# Patient Record
Sex: Male | Born: 1960 | Race: White | Hispanic: No | Marital: Married | Smoking: Current some day smoker
Health system: Southern US, Community
[De-identification: ages and names within clinical notes are randomized; demographics above are authoritative.]

## PROBLEM LIST (undated history)

## (undated) ENCOUNTER — Emergency Department (HOSPITAL_COMMUNITY): Payer: Medicare HMO | Source: Home / Self Care

## (undated) DIAGNOSIS — G2581 Restless legs syndrome: Secondary | ICD-10-CM

## (undated) DIAGNOSIS — I2510 Atherosclerotic heart disease of native coronary artery without angina pectoris: Secondary | ICD-10-CM

## (undated) DIAGNOSIS — I81 Portal vein thrombosis: Secondary | ICD-10-CM

## (undated) DIAGNOSIS — C3490 Malignant neoplasm of unspecified part of unspecified bronchus or lung: Secondary | ICD-10-CM

## (undated) DIAGNOSIS — F329 Major depressive disorder, single episode, unspecified: Secondary | ICD-10-CM

## (undated) DIAGNOSIS — C787 Secondary malignant neoplasm of liver and intrahepatic bile duct: Secondary | ICD-10-CM

## (undated) DIAGNOSIS — F32A Depression, unspecified: Secondary | ICD-10-CM

## (undated) DIAGNOSIS — G8929 Other chronic pain: Secondary | ICD-10-CM

## (undated) DIAGNOSIS — J449 Chronic obstructive pulmonary disease, unspecified: Secondary | ICD-10-CM

## (undated) DIAGNOSIS — M549 Dorsalgia, unspecified: Secondary | ICD-10-CM

## (undated) DIAGNOSIS — K219 Gastro-esophageal reflux disease without esophagitis: Secondary | ICD-10-CM

## (undated) DIAGNOSIS — C7951 Secondary malignant neoplasm of bone: Secondary | ICD-10-CM

## (undated) DIAGNOSIS — E785 Hyperlipidemia, unspecified: Secondary | ICD-10-CM

## (undated) DIAGNOSIS — K3184 Gastroparesis: Secondary | ICD-10-CM

## (undated) DIAGNOSIS — E119 Type 2 diabetes mellitus without complications: Secondary | ICD-10-CM

## (undated) DIAGNOSIS — I219 Acute myocardial infarction, unspecified: Secondary | ICD-10-CM

## (undated) DIAGNOSIS — Z79899 Other long term (current) drug therapy: Secondary | ICD-10-CM

## (undated) HISTORY — PX: BACK SURGERY: SHX140

## (undated) HISTORY — PX: CARDIAC CATHETERIZATION: SHX172

---

## 2001-03-21 DIAGNOSIS — I219 Acute myocardial infarction, unspecified: Secondary | ICD-10-CM

## 2001-03-21 HISTORY — DX: Acute myocardial infarction, unspecified: I21.9

## 2018-07-11 ENCOUNTER — Ambulatory Visit (HOSPITAL_COMMUNITY)
Admission: RE | Admit: 2018-07-11 | Discharge: 2018-07-11 | Disposition: A | Discharge status: Home / Self Care | Payer: Medicare HMO | Source: Ambulatory Visit | Attending: Family | Admitting: Family

## 2018-07-11 ENCOUNTER — Other Ambulatory Visit (HOSPITAL_COMMUNITY): Payer: Self-pay | Admitting: Neurosurgery

## 2018-07-11 ENCOUNTER — Other Ambulatory Visit: Payer: Self-pay

## 2018-07-11 DIAGNOSIS — R600 Localized edema: Secondary | ICD-10-CM

## 2018-07-12 ENCOUNTER — Encounter (HOSPITAL_BASED_OUTPATIENT_CLINIC_OR_DEPARTMENT_OTHER): Payer: Self-pay | Admitting: *Deleted

## 2018-07-12 ENCOUNTER — Other Ambulatory Visit: Payer: Self-pay

## 2018-07-12 ENCOUNTER — Emergency Department (HOSPITAL_BASED_OUTPATIENT_CLINIC_OR_DEPARTMENT_OTHER)
Admission: EM | Admit: 2018-07-12 | Discharge: 2018-07-12 | Disposition: A | Discharge status: Home / Self Care | Payer: Medicare HMO | Attending: Emergency Medicine | Admitting: Emergency Medicine

## 2018-07-12 DIAGNOSIS — Z7982 Long term (current) use of aspirin: Secondary | ICD-10-CM | POA: Diagnosis not present

## 2018-07-12 DIAGNOSIS — R112 Nausea with vomiting, unspecified: Secondary | ICD-10-CM | POA: Insufficient documentation

## 2018-07-12 DIAGNOSIS — M545 Low back pain, unspecified: Secondary | ICD-10-CM

## 2018-07-12 DIAGNOSIS — Z7984 Long term (current) use of oral hypoglycemic drugs: Secondary | ICD-10-CM | POA: Insufficient documentation

## 2018-07-12 DIAGNOSIS — R197 Diarrhea, unspecified: Secondary | ICD-10-CM | POA: Diagnosis not present

## 2018-07-12 DIAGNOSIS — Z79899 Other long term (current) drug therapy: Secondary | ICD-10-CM | POA: Insufficient documentation

## 2018-07-12 DIAGNOSIS — F17210 Nicotine dependence, cigarettes, uncomplicated: Secondary | ICD-10-CM | POA: Insufficient documentation

## 2018-07-12 DIAGNOSIS — E119 Type 2 diabetes mellitus without complications: Secondary | ICD-10-CM | POA: Insufficient documentation

## 2018-07-12 DIAGNOSIS — J449 Chronic obstructive pulmonary disease, unspecified: Secondary | ICD-10-CM | POA: Diagnosis not present

## 2018-07-12 HISTORY — DX: Type 2 diabetes mellitus without complications: E11.9

## 2018-07-12 HISTORY — DX: Gastro-esophageal reflux disease without esophagitis: K21.9

## 2018-07-12 HISTORY — DX: Acute myocardial infarction, unspecified: I21.9

## 2018-07-12 HISTORY — DX: Gastroparesis: K31.84

## 2018-07-12 HISTORY — DX: Dorsalgia, unspecified: M54.9

## 2018-07-12 HISTORY — DX: Other chronic pain: G89.29

## 2018-07-12 HISTORY — DX: Atherosclerotic heart disease of native coronary artery without angina pectoris: I25.10

## 2018-07-12 HISTORY — DX: Other long term (current) drug therapy: Z79.899

## 2018-07-12 HISTORY — DX: Hyperlipidemia, unspecified: E78.5

## 2018-07-12 HISTORY — DX: Chronic obstructive pulmonary disease, unspecified: J44.9

## 2018-07-12 HISTORY — DX: Major depressive disorder, single episode, unspecified: F32.9

## 2018-07-12 HISTORY — DX: Depression, unspecified: F32.A

## 2018-07-12 HISTORY — DX: Restless legs syndrome: G25.81

## 2018-07-12 LAB — COMPREHENSIVE METABOLIC PANEL
ALT: 18 U/L (ref 0–44)
AST: 15 U/L (ref 15–41)
Albumin: 3.3 g/dL — ABNORMAL LOW (ref 3.5–5.0)
Alkaline Phosphatase: 42 U/L (ref 38–126)
Anion gap: 10 (ref 5–15)
BUN: 9 mg/dL (ref 6–20)
CO2: 20 mmol/L — ABNORMAL LOW (ref 22–32)
Calcium: 8.8 mg/dL — ABNORMAL LOW (ref 8.9–10.3)
Chloride: 106 mmol/L (ref 98–111)
Creatinine, Ser: 0.63 mg/dL (ref 0.61–1.24)
GFR calc Af Amer: >60 mL/min (ref >60)
GFR calc non Af Amer: >60 mL/min (ref >60)
Glucose, Bld: 132 mg/dL — ABNORMAL HIGH (ref 70–99)
Potassium: 3.5 mmol/L (ref 3.5–5.1)
Sodium: 136 mmol/L (ref 135–145)
Total Bilirubin: 0.5 mg/dL (ref 0.3–1.2)
Total Protein: 6.5 g/dL (ref 6.5–8.1)

## 2018-07-12 LAB — CBC WITH DIFFERENTIAL/PLATELET
Abs Immature Granulocytes: 0.02 10*3/uL (ref 0.00–0.07)
Basophils Absolute: 0 10*3/uL (ref 0.0–0.1)
Basophils Relative: 1 %
Eosinophils Absolute: 0 10*3/uL (ref 0.0–0.5)
Eosinophils Relative: 0 %
HCT: 40.7 % (ref 39.0–52.0)
Hemoglobin: 13.5 g/dL (ref 13.0–17.0)
Immature Granulocytes: 0 %
Lymphocytes Relative: 22 %
Lymphs Abs: 1.9 10*3/uL (ref 0.7–4.0)
MCH: 31 pg (ref 26.0–34.0)
MCHC: 33.2 g/dL (ref 30.0–36.0)
MCV: 93.3 fL (ref 80.0–100.0)
Monocytes Absolute: 0.5 10*3/uL (ref 0.1–1.0)
Monocytes Relative: 6 %
Neutro Abs: 5.9 10*3/uL (ref 1.7–7.7)
Neutrophils Relative %: 71 %
Platelets: 410 10*3/uL — ABNORMAL HIGH (ref 150–400)
RBC: 4.36 MIL/uL (ref 4.22–5.81)
RDW: 13.1 % (ref 11.5–15.5)
WBC: 8.3 10*3/uL (ref 4.0–10.5)
nRBC: 0 % (ref 0.0–0.2)

## 2018-07-12 MED ORDER — VANCOMYCIN HCL 125 MG PO CAPS: 125.0000 mg | ORAL_CAPSULE | Freq: Four times a day (QID) | ORAL | 0 refills | Status: DC

## 2018-07-12 MED ORDER — SODIUM CHLORIDE 0.9 % IV BOLUS
1000.0000 mL | Freq: Once | INTRAVENOUS | Status: AC
  Administered 2018-07-12: 1000 mL via INTRAVENOUS

## 2018-07-12 MED ORDER — ONDANSETRON HCL 4 MG/2ML IJ SOLN
4.0000 mg | Freq: Once | INTRAMUSCULAR | Status: AC
  Administered 2018-07-12: 21:00:00 4 mg via INTRAVENOUS
  Filled 2018-07-12: qty 2

## 2018-07-12 MED ORDER — PROMETHAZINE HCL 25 MG RE SUPP: 25.0000 mg | Freq: Four times a day (QID) | RECTAL | 0 refills | Status: DC | PRN

## 2018-07-12 MED ORDER — ONDANSETRON HCL 4 MG PO TABS: 4.0000 mg | ORAL_TABLET | Freq: Three times a day (TID) | ORAL | 0 refills | Status: DC | PRN

## 2018-07-12 MED ORDER — MORPHINE SULFATE (PF) 4 MG/ML IV SOLN
4.0000 mg | Freq: Once | INTRAVENOUS | Status: AC
  Administered 2018-07-12: 21:00:00 4 mg via INTRAVENOUS
  Filled 2018-07-12: qty 1

## 2018-07-12 NOTE — ED Provider Notes (Signed)
[facility] EMERGENCY DEPARTMENT Provider Note   CSN: [id number] Arrival date & time: 07/12/18  1958    History   Chief Complaint Chief Complaint  Patient presents with  . Diarrhea    HPI Bill Garza is a 58 y.o. male.  He has a history of chronic back pain that radiates down his left leg.  He had back surgery with [facility] on Friday.  He had 2 loose bowel movements yesterday and he feels like he has C. difficile which she has had before.  He has had no fever and no loose bowel movements today.  He feels he received an IV dose of antibiotics while he was asleep during surgery and this is led to him to have C. difficile.  He is asking for some IV fluids and some IV pain medication along with nausea medication.  He does not feel his pain is adequately controlled with his oral pain medicines from his surgery.  He said he had C. difficile last year when he had his cardiac stents.     The history is provided by the patient.  Diarrhea  Quality:  Malodorous Onset quality:  Sudden Number of episodes:  2 Duration:  24 hours Progression:  Unchanged Relieved by:  None tried Worsened by:  Nothing Ineffective treatments:  None tried Associated symptoms: abdominal pain   Associated symptoms: no chills, no recent cough, no fever, no headaches, no myalgias and no vomiting   Risk factors: recent antibiotic use (?)   Risk factors: no travel to endemic areas     Past Medical History:  Diagnosis Date  . Chronic back pain   . Chronic GERD   . COPD (chronic obstructive pulmonary disease) ([place])   . Diabetes mellitus without complication ([place])   . Hyperlipidemia     There are no active problems to display for this patient.   Past Surgical History:  Procedure Laterality Date  . BACK SURGERY    . CARDIAC CATHETERIZATION          Home Medications    Prior to Admission medications   Medication Sig Start Date End Date Taking? Authorizing Provider  aspirin EC  81 MG tablet Take 81 mg by mouth daily.   Yes [provider]  budesonide-formoterol (SYMBICORT) 160-4.5 MCG/ACT inhaler Inhale 2 puffs into the lungs 2 (two) times daily.   Yes [provider]  metFORMIN (GLUCOPHAGE) 500 MG tablet Take by mouth 2 (two) times daily with a meal.   Yes [provider]  montelukast (SINGULAIR) 10 MG tablet Take 10 mg by mouth at bedtime.   Yes [provider]  oxycodone (OXY-IR) 5 MG capsule Take 5 mg by mouth every 4 (four) hours as needed.   Yes [provider]  pantoprazole (PROTONIX) 40 MG tablet Take 40 mg by mouth daily.   Yes [provider]  pravastatin (PRAVACHOL) 80 MG tablet Take 80 mg by mouth daily.   Yes [provider]  tiZANidine (ZANAFLEX) 4 MG tablet Take 4 mg by mouth every 6 (six) hours as needed for muscle spasms.   Yes [provider]    Family History History reviewed. No pertinent family history.  Social History Social History   Tobacco Use  . Smoking status: Current Every Day Smoker    Packs/day: 1.00    Types: Cigarettes  . Smokeless tobacco: Never Used  Substance Use Topics  . Alcohol use: Not Currently  . Drug use: Not Currently  Allergies   Patient has no known allergies.   Review of Systems Review of Systems  Constitutional: Negative for chills and fever.  HENT: Negative for sore throat.   Eyes: Negative for visual disturbance.  Respiratory: Negative for shortness of breath.   Cardiovascular: Negative for chest pain.  Gastrointestinal: Positive for abdominal pain, diarrhea and nausea. Negative for vomiting.  Genitourinary: Negative for dysuria.  Musculoskeletal: Positive for back pain. Negative for myalgias.  Skin: Negative for rash.  Neurological: Negative for headaches.     Physical Exam Updated Vital Signs BP (!) 152/96   Pulse 81   Temp 98.3 F (36.8 C) (Oral)   Resp 18   Ht 6' (1.829 m)   Wt 84.8 kg   SpO2 99%   BMI 25.36  kg/m   Physical Exam Vitals signs and nursing note reviewed.  Constitutional:      Appearance: He is well-developed.  HENT:     Head: Normocephalic and atraumatic.  Eyes:     Conjunctiva/sclera: Conjunctivae normal.  Neck:     Musculoskeletal: Neck supple.  Cardiovascular:     Rate and Rhythm: Normal rate and regular rhythm.     Heart sounds: No murmur.  Pulmonary:     Effort: Pulmonary effort is normal. No respiratory distress.     Breath sounds: Normal breath sounds.  Abdominal:     Palpations: Abdomen is soft.     Tenderness: There is no abdominal tenderness. There is no guarding.     Hernia: No hernia is present.  Musculoskeletal: Normal range of motion.     Right lower leg: No edema.     Left lower leg: No edema.  Skin:    General: Skin is warm and dry.     Capillary Refill: Capillary refill takes less than 2 seconds.  Neurological:     Mental Status: He is alert and oriented to person, place, and time. Mental status is at baseline.      ED Treatments / Results  Labs (all labs ordered are listed, but only abnormal results are displayed) Labs Reviewed  COMPREHENSIVE METABOLIC PANEL - Abnormal; Notable for the following components:      Result Value   CO2 20 (*)    Glucose, Bld 132 (*)    Calcium 8.8 (*)    Albumin 3.3 (*)    All other components within normal limits  CBC WITH DIFFERENTIAL/PLATELET - Abnormal; Notable for the following components:   Platelets 410 (*)    All other components within normal limits  C DIFFICILE QUICK SCREEN W PCR REFLEX    EKG None  Radiology Vas [place] Lower Extremity Venous (dvt)  Result Date: 07/11/2018  Lower Venous Study Indications: Swelling, and Edema.  Risk Factors: Surgery Recent back surgery. Performing Technologist: Delorise Shiner RVT  Examination Guidelines: A complete evaluation includes B-mode imaging, spectral Doppler, color Doppler, and power Doppler as needed of all accessible portions of each vessel. Bilateral  testing is considered an integral part of a complete examination. Limited examinations for reoccurring indications may be performed as noted.  +---------+---------------+---------+-----------+----------+-------+ RIGHT    CompressibilityPhasicitySpontaneityPropertiesSummary +---------+---------------+---------+-----------+----------+-------+ CFV      Full           Yes      Yes                          +---------+---------------+---------+-----------+----------+-------+ SFJ      Full                                                 +---------+---------------+---------+-----------+----------+-------+  FV Prox  Full                    Yes                          +---------+---------------+---------+-----------+----------+-------+ FV Mid   Full                    Yes                          +---------+---------------+---------+-----------+----------+-------+ FV DistalFull                    Yes                          +---------+---------------+---------+-----------+----------+-------+ PFV      Full                    Yes                          +---------+---------------+---------+-----------+----------+-------+ POP      Full                    Yes                          +---------+---------------+---------+-----------+----------+-------+ PTV      Full                                                 +---------+---------------+---------+-----------+----------+-------+ PERO     Full                                                 +---------+---------------+---------+-----------+----------+-------+ GSV      Full                                                 +---------+---------------+---------+-----------+----------+-------+ SSV      Full                                                 +---------+---------------+---------+-----------+----------+-------+   +---------+---------------+---------+-----------+----------+-------+ LEFT      CompressibilityPhasicitySpontaneityPropertiesSummary +---------+---------------+---------+-----------+----------+-------+ CFV      Full           Yes      Yes                          +---------+---------------+---------+-----------+----------+-------+ SFJ      Full                    Yes                          +---------+---------------+---------+-----------+----------+-------+ FV Prox  Full  Yes                          +---------+---------------+---------+-----------+----------+-------+ FV Mid   Full                    Yes                          +---------+---------------+---------+-----------+----------+-------+ FV DistalFull                    Yes                          +---------+---------------+---------+-----------+----------+-------+ PFV      Full                    Yes                          +---------+---------------+---------+-----------+----------+-------+ POP      Full                    Yes                          +---------+---------------+---------+-----------+----------+-------+ PTV      Full                                                 +---------+---------------+---------+-----------+----------+-------+ PERO     Full                                                 +---------+---------------+---------+-----------+----------+-------+ GSV      Full                                                 +---------+---------------+---------+-----------+----------+-------+ SSV      Full                                                 +---------+---------------+---------+-----------+----------+-------+     Summary: Right: There is no evidence of deep vein thrombosis in the lower extremity. There is no evidence of superficial venous thrombosis. No cystic structure found in the popliteal fossa. Left: There is no evidence of deep vein thrombosis in the lower extremity. There is no evidence of  superficial venous thrombosis. No cystic structure found in the popliteal fossa.  *See table(s) above for measurements and observations. Electronically signed by Deitra Mayo MD on 07/11/2018 at 1:46:04 PM.    Final     Procedures Procedures (including critical care time)  Medications Ordered in ED Medications  morphine 4 MG/ML injection 4 mg (has no administration in time range)  ondansetron (ZOFRAN) injection 4 mg (has no administration in time range)  sodium chloride 0.9 % bolus 1,000 mL (has no administration in time range)  Initial Impression / Assessment and Plan / ED Course  I have reviewed the triage vital signs and the nursing notes.  Pertinent labs & imaging results that were available during my care of the patient were reviewed by me and considered in my medical decision making (see chart for details).  Clinical Course as of Jul 12 2203  Thu Jul 12, 2018  2115 Ddx: cdif, functional diarrhea, postoperative pain.    [MB]  2133 I updated the patient regarding his labs.  He said he still feels like he has C. difficile and wants to start on some antibiotics.  I told him his doctor would probably rather him wait until he can give a sample but I will provide him a prescription for the oral vancomycin and some nausea medication.  He understands that he needs to continue his oral pain medicine as directed by his pain doctors.   [MB]    Clinical Course User Index [MB] Hayden Rasmussen, MD        Final Clinical Impressions(s) / ED Diagnoses   Final diagnoses:  Nausea vomiting and diarrhea  Low back pain, unspecified back pain laterality, unspecified chronicity, unspecified whether sciatica present    ED Discharge Orders         Ordered    ondansetron (ZOFRAN) 4 MG tablet  Every 8 hours PRN     07/12/18 2136    vancomycin (VANCOCIN) 125 MG capsule  4 times daily     07/12/18 2136    promethazine (PHENERGAN) 25 MG suppository  Every 6 hours PRN     07/12/18 2152            Hayden Rasmussen, MD 07/12/18 2205

## 2018-07-12 NOTE — ED Triage Notes (Addendum)
Pt c/o looses stools x 1 day  with hx of c diff,  Back surgery 6 days ago , c/o back pain , pt is in Pain management

## 2018-07-12 NOTE — Discharge Instructions (Signed)
You were seen in the emergency department for nausea vomiting diarrhea and concern for C. difficile.  You were unable to provide a stool sample here for testing.  Your blood work did not show any serious findings.  You were given some IV fluids along with pain medicine and nausea medication.  Please continue your postop pain medicine and we are providing a prescription for some nausea medicine.  We are also giving a prescription for vancomycin, but I would recommend that you hold that until you are able to get a stool sample tested.  Please follow-up with your doctors.

## 2021-01-22 ENCOUNTER — Ambulatory Visit: Payer: Medicare HMO | Admitting: Hematology & Oncology

## 2021-01-22 ENCOUNTER — Other Ambulatory Visit: Payer: Medicare HMO

## 2021-01-27 ENCOUNTER — Encounter: Payer: Self-pay | Admitting: Hematology & Oncology

## 2021-01-27 ENCOUNTER — Inpatient Hospital Stay: Payer: Medicare HMO | Attending: Hematology & Oncology

## 2021-01-27 ENCOUNTER — Other Ambulatory Visit: Payer: Self-pay

## 2021-01-27 ENCOUNTER — Inpatient Hospital Stay (HOSPITAL_BASED_OUTPATIENT_CLINIC_OR_DEPARTMENT_OTHER): Payer: Medicare HMO | Admitting: Hematology & Oncology

## 2021-01-27 VITALS — BP 131/95 | HR 77 | Temp 98.1°F | Resp 20 | Wt 164.0 lb

## 2021-01-27 DIAGNOSIS — C3492 Malignant neoplasm of unspecified part of left bronchus or lung: Secondary | ICD-10-CM | POA: Insufficient documentation

## 2021-01-27 DIAGNOSIS — C3490 Malignant neoplasm of unspecified part of unspecified bronchus or lung: Secondary | ICD-10-CM

## 2021-01-27 DIAGNOSIS — C7931 Secondary malignant neoplasm of brain: Secondary | ICD-10-CM | POA: Insufficient documentation

## 2021-01-27 DIAGNOSIS — R110 Nausea: Secondary | ICD-10-CM | POA: Diagnosis not present

## 2021-01-27 DIAGNOSIS — Z7189 Other specified counseling: Secondary | ICD-10-CM | POA: Diagnosis not present

## 2021-01-27 DIAGNOSIS — G8929 Other chronic pain: Secondary | ICD-10-CM | POA: Diagnosis not present

## 2021-01-27 DIAGNOSIS — M542 Cervicalgia: Secondary | ICD-10-CM | POA: Diagnosis not present

## 2021-01-27 DIAGNOSIS — F419 Anxiety disorder, unspecified: Secondary | ICD-10-CM | POA: Insufficient documentation

## 2021-01-27 DIAGNOSIS — C3411 Malignant neoplasm of upper lobe, right bronchus or lung: Secondary | ICD-10-CM

## 2021-01-27 DIAGNOSIS — Z79899 Other long term (current) drug therapy: Secondary | ICD-10-CM | POA: Diagnosis not present

## 2021-01-27 DIAGNOSIS — M549 Dorsalgia, unspecified: Secondary | ICD-10-CM | POA: Insufficient documentation

## 2021-01-27 DIAGNOSIS — Z87891 Personal history of nicotine dependence: Secondary | ICD-10-CM | POA: Diagnosis not present

## 2021-01-27 DIAGNOSIS — J449 Chronic obstructive pulmonary disease, unspecified: Secondary | ICD-10-CM | POA: Insufficient documentation

## 2021-01-27 DIAGNOSIS — I252 Old myocardial infarction: Secondary | ICD-10-CM | POA: Diagnosis not present

## 2021-01-27 DIAGNOSIS — Z888 Allergy status to other drugs, medicaments and biological substances status: Secondary | ICD-10-CM | POA: Diagnosis not present

## 2021-01-27 DIAGNOSIS — M791 Myalgia, unspecified site: Secondary | ICD-10-CM | POA: Diagnosis not present

## 2021-01-27 HISTORY — DX: Other specified counseling: Z71.89

## 2021-01-27 HISTORY — DX: Malignant neoplasm of unspecified part of unspecified bronchus or lung: C34.90

## 2021-01-27 LAB — CMP (CANCER CENTER ONLY)
ALT: 17 U/L (ref 0–44)
AST: 18 U/L (ref 15–41)
Albumin: 4.1 g/dL (ref 3.5–5.0)
Alkaline Phosphatase: 37 U/L — ABNORMAL LOW (ref 38–126)
Anion gap: 10 (ref 5–15)
BUN: 12 mg/dL (ref 6–20)
CO2: 25 mmol/L (ref 22–32)
Calcium: 9.6 mg/dL (ref 8.9–10.3)
Chloride: 104 mmol/L (ref 98–111)
Creatinine: 0.95 mg/dL (ref 0.61–1.24)
GFR, Estimated: >60 mL/min (ref >60)
Glucose, Bld: 154 mg/dL — ABNORMAL HIGH (ref 70–99)
Potassium: 4.2 mmol/L (ref 3.5–5.1)
Sodium: 139 mmol/L (ref 135–145)
Total Bilirubin: 0.4 mg/dL (ref 0.3–1.2)
Total Protein: 6.3 g/dL — ABNORMAL LOW (ref 6.5–8.1)

## 2021-01-27 LAB — CBC WITH DIFFERENTIAL (CANCER CENTER ONLY)
Abs Immature Granulocytes: 0.02 10*3/uL (ref 0.00–0.07)
Basophils Absolute: 0.1 10*3/uL (ref 0.0–0.1)
Basophils Relative: 1 %
Eosinophils Absolute: 0.1 10*3/uL (ref 0.0–0.5)
Eosinophils Relative: 2 %
HCT: 34.5 % — ABNORMAL LOW (ref 39.0–52.0)
Hemoglobin: 11 g/dL — ABNORMAL LOW (ref 13.0–17.0)
Immature Granulocytes: 0 %
Lymphocytes Relative: 47 %
Lymphs Abs: 2.9 10*3/uL (ref 0.7–4.0)
MCH: 29 pg (ref 26.0–34.0)
MCHC: 31.9 g/dL (ref 30.0–36.0)
MCV: 91 fL (ref 80.0–100.0)
Monocytes Absolute: 0.3 10*3/uL (ref 0.1–1.0)
Monocytes Relative: 5 %
Neutro Abs: 2.8 10*3/uL (ref 1.7–7.7)
Neutrophils Relative %: 45 %
Platelet Count: 400 10*3/uL (ref 150–400)
RBC: 3.79 MIL/uL — ABNORMAL LOW (ref 4.22–5.81)
RDW: 15.3 % (ref 11.5–15.5)
WBC Count: 6.3 10*3/uL (ref 4.0–10.5)
nRBC: 0 % (ref 0.0–0.2)

## 2021-01-27 LAB — LACTATE DEHYDROGENASE: LDH: 111 U/L (ref 98–192)

## 2021-01-27 MED ORDER — OXYCODONE HCL 10 MG PO TABS: 5.0000 mg | ORAL_TABLET | ORAL | 0 refills | Status: DC | PRN

## 2021-01-27 MED ORDER — CITALOPRAM HYDROBROMIDE 10 MG PO TABS: 10.0000 mg | ORAL_TABLET | Freq: Every day | ORAL | 3 refills | Status: DC

## 2021-01-27 NOTE — Progress Notes (Signed)
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  : I am not sure exactly what is going on.  HPI: Bill Garza is a very nice 60 year old white male.  He comes in with his wife.  He has been pretty healthy.  He apparently was found on a routine scan to have a left lung nodule.  This was secondary to him having COPD.  He is being followed and screened.  He ultimately underwent a VATS resection in June.  This was done on 09/02/2020.  He had a non-small cell lung cancer.  I have to assume this is an adenocarcinoma.  It was stage IIb (T3N0M0).  He then was treated with adjuvant chemotherapy.  He was placed on protocol with the ALCHEMIST trial.  I think he was treated with cisplatin/Alimta/Keytruda.  Apparently, he only had 1 cycle of treatment.  I am unsure if he had toxicity.  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  He apparently had a left occipital lesion.  He subsequently underwent resection of this back in November 16, 2020.  He had 2 smaller lesions in the brain.  He underwent the stereotactic radiosurgery for these.  He subsequently started systemic therapy.  He had a carboplatinum/Alimta/pembrolizumab.  This was in October.  I am unsure as to why he received chemotherapy.  He had a PET scan that was done on 01/25/2021 which did not show any evidence of systemic disease.  He lives in [place].  It is a lot easier for him to see [place].  As such, he is currently being referred to the [facility] for evaluation.  His pain is the big problem right now.  He is having chronic pain issues.  He is still nauseated.  He is on Dilaudid.  We will see if this can be changed to something different.  He also wants to get off Ativan.  He has a lot of anxiety.  He is eating pretty well.  Again he has a  little bit of nausea.  There is no constipation or diarrhea.  He has had no bleeding.  He has had no fever.  He is not smoking at the present time.  He probably has close to 100-pack-year history of tobacco use.  Back in his prime, he was smoking 3 packs a day.  I would have to say that overall, his performance status is probably ECOG 1.  Past Medical History:  Diagnosis Date   Chronic back pain    Chronic GERD    Chronic pain    COPD (chronic obstructive pulmonary disease) ([facility])    Coronary artery disease    Depression    Diabetes mellitus without complication ([facility])    Gastroparesis    High risk medication use    Hyperlipidemia    MI (myocardial infarction) ([place])    Restless leg syndrome   :   Past Surgical History:  Procedure Laterality Date   BACK SURGERY     CARDIAC CATHETERIZATION    :   Current Outpatient Medications:    ACCU-CHEK GUIDE test strip, CHECK BLOOD SUGAR DAILY, Disp: , Rfl:    acetaminophen (TYLENOL) 325 MG tablet, Take by mouth., Disp: , Rfl:    albuterol (PROVENTIL) (2.5 MG/3ML) 0.083% nebulizer solution, 1 vial every 4-6 hours as needed for shortness  of breath., Disp: , Rfl:    albuterol (VENTOLIN HFA) 108 (90 Base) MCG/ACT inhaler, INHALE 2 PUFFS INTO LUNGS EVERY 4 HOURS AS NEEDED SHORTNESS OF BREATH, Disp: , Rfl:    aspirin EC 81 MG tablet, Take 81 mg by mouth daily., Disp: , Rfl:    Bempedoic Acid (NEXLETOL) 180 MG TABS, Take 1 tablet by mouth daily., Disp: , Rfl:    Blood Glucose Monitoring Suppl (ACCU-CHEK GUIDE) w/Device KIT, USE AS DIRECTED TO CHECK BLOOD SUGAR, Disp: , Rfl:    Blood Glucose Monitoring Suppl (GLUCOCOM BLOOD GLUCOSE MONITOR) DEVI, Use to check blood sugar daily., Disp: , Rfl:    budesonide-formoterol (SYMBICORT) 160-4.5 MCG/ACT inhaler, Inhale 2 puffs into the lungs 2 (two) times daily., Disp: , Rfl:    dronabinol (MARINOL) 5 MG capsule, Take 5 mg by mouth 4 (four) times daily., Disp: , Rfl:    ferrous sulfate 325 (65 FE) MG  tablet, Take by mouth., Disp: , Rfl:    fluocinolone (VANOS) 0.01 % cream, Apply topically., Disp: , Rfl:    folic acid (FOLVITE) 1 MG tablet, Take 1 mg by mouth daily., Disp: , Rfl:    furosemide (LASIX) 20 MG tablet, Take 40 mg by mouth daily., Disp: , Rfl:    glucose blood (PRECISION QID TEST) test strip, Use to check blood sugar daily., Disp: , Rfl:    HYDROmorphone (DILAUDID) 4 MG tablet, Take 4 mg by mouth every 4 (four) hours as needed., Disp: , Rfl:    Lancets MISC, Use to check blood sugar daily., Disp: , Rfl:    LORazepam (ATIVAN) 0.5 MG tablet, Take 0.5 mg by mouth every 6 (six) hours as needed., Disp: , Rfl:    metFORMIN (GLUCOPHAGE) 500 MG tablet, Take by mouth daily., Disp: , Rfl:    metoCLOPramide (REGLAN) 10 MG tablet, Take 10 mg by mouth 4 (four) times daily., Disp: , Rfl:    montelukast (SINGULAIR) 10 MG tablet, Take 10 mg by mouth at bedtime., Disp: , Rfl:    Multiple Vitamin (MULTIVITAMIN ADULT PO), Take 1 tablet by mouth daily., Disp: , Rfl:    naloxone (NARCAN) nasal spray 4 mg/0.1 mL, SMARTSIG:In Nostril Daily, Disp: , Rfl:    pantoprazole (PROTONIX) 40 MG tablet, Take 40 mg by mouth daily., Disp: , Rfl:    pravastatin (PRAVACHOL) 80 MG tablet, Take 80 mg by mouth daily., Disp: , Rfl:    SENNA S 8.6-50 MG tablet, Take 2 tablets by mouth at bedtime., Disp: , Rfl:    tiZANidine (ZANAFLEX) 4 MG tablet, Take 4 mg by mouth every 6 (six) hours as needed for muscle spasms., Disp: , Rfl:    zinc sulfate 220 (50 Zn) MG capsule, Take 1 capsule by mouth daily., Disp: , Rfl:    nitroGLYCERIN (NITROSTAT) 0.4 MG SL tablet, DISSOLVE ONE TABLET UNDER TONGUE EVERY 5 MINUTES AS NEEDED UP TO 3 DOSES, IF NORELIEF CALL 911 (Patient not taking: Reported on 01/27/2021), Disp: , Rfl:    promethazine (PHENERGAN) 25 MG suppository, Place 1 suppository (25 mg total) rectally every 6 (six) hours as needed for nausea or vomiting. (Patient not taking: Reported on 01/27/2021), Disp: 12 each, Rfl:  0:  :   Allergies  Allergen Reactions   Gabapentin Swelling    Leg swelling    Pregabalin Swelling    Leg swelling    Bupropion Nausea And Vomiting   Duloxetine Other (See Comments)    Excessive sleeping   Ezetimibe Other (See Comments)  Levetiracetam Other (See Comments)    Personality changes   Other Other (See Comments)    Antibiotics / post. C-diff   Prochlorperazine Nausea And Vomiting   Lamotrigine Itching and Rash  :  No family history on file.:   Social History   Socioeconomic History   Marital status: Married    Spouse name: Not on file   Number of children: Not on file   Years of education: Not on file   Highest education level: Not on file  Occupational History   Not on file  Tobacco Use   Smoking status: Former    Packs/day: 1.00    Years: 40.00    Pack years: 40.00    Types: Cigarettes    Quit date: 07/20/2020    Years since quitting: 0.5   Smokeless tobacco: Never  Substance and Sexual Activity   Alcohol use: Not Currently   Drug use: Not Currently   Sexual activity: Not on file  Other Topics Concern   Not on file  Social History Narrative   Not on file   Social Determinants of Health   Financial Resource Strain: Not on file  Food Insecurity: Not on file  Transportation Needs: Not on file  Physical Activity: Not on file  Stress: Not on file  Social Connections: Not on file  Intimate Partner Violence: Not on file  :  Review of Systems  Constitutional: Negative.   HENT: Negative.    Eyes: Negative.   Respiratory: Negative.    Cardiovascular: Negative.   Gastrointestinal:  Positive for nausea.  Genitourinary: Negative.   Musculoskeletal:  Positive for back pain, myalgias and neck pain.  Skin: Negative.   Neurological: Negative.   Endo/Heme/Allergies: Negative.   Psychiatric/Behavioral:  The patient is nervous/anxious.     Exam: _0 @ This is a well-developed well-nourished white male in no obvious distress.  Vital  signs show a temperature of 98.1.  Pulse 77.  Blood pressure 131/95.  Weight is 164 pounds.  Head and neck exam shows the craniotomy site in the posterior skull.  This is well-healed.  He has no adenopathy in the neck.  He has no adenopathy in the supraclavicular region.  Thyroid is nonpalpable.  There is no oral lesions.  Pupils react appropriately.  Lungs are clear bilaterally.  He has no wheezes.  Cardiac exam regular rate and rhythm with no murmurs, rubs or bruits.  Abdomen is soft.  He has good bowel sounds.  There is no fluid wave.  He has no palpable liver or spleen tip.  Back exam shows a thoracoscopy scars over in the right lateral chest wall.  These are well-healed.  He has no tenderness over the spine or hips.  Again there is little bit of tenderness on the left rib cage.  Extremity shows no clubbing or cyanosis.  There may be a little bit of edema in his legs.  He has good range of motion of his joints.  He has good strength in upper and lower extremities.  Neurological exam shows no focal neurological deficits.  Skin exam shows no rashes, ecchymoses or petechia.   Recent Labs    01/27/21 1403  WBC 6.3  HGB 11.0*  HCT 34.5*  PLT 400    Recent Labs    01/27/21 1403  NA 139  K 4.2  CL 104  CO2 25  GLUCOSE 154*  BUN 12  CREATININE 0.95  CALCIUM 9.6    Blood smear review: None  Pathology: See above  Assessment and Plan: Bill Garza is a very nice 60 year old white male.  He has incredibly interesting history.  He had stage IIb non-small cell lung cancer of the left lung.  He had negative lymph nodes.  Yet, he had metastatic disease shortly after he was diagnosed had surgery.  He subsequently had resection of the main CNS met.  He had radiosurgery for the other 2 lesions.  Again I am not sure as to why he is getting systemic therapy since there does not appear to be any obvious systemic therapy that is measurable.  As such, I am not sure what would be treated.  I would not  think this would be "adjuvant" therapy.  I think that for right now, we really need to hold off on any treatment on him.  Again, not sure what we would be accomplishing treating him right now without any measurable disease.  I am sure that at some point, he will have measurable disease.  At that point we can definitely institute therapy.  He would be okay with this.  He would like to have his MRI done here of the brain.  It somewhat easier for him to have it done here.  We will see about getting this set up for him after Thanksgiving.  I had a delightful time with he and his wife.  They are both very very nice.  He is on a ton of medications.  I am unsure how he can get off of some of his medications.  We will see about switching the Dilaudid out for oxycodone and see if this may help with pain also help decrease with his nausea.  We will get him off the Ativan.  We will try him on a SSRI type agent to see if this may help with his anxiety.  I would like to see him back after he has the MRI done.  Would likely will do another PET scan probably in January.

## 2021-02-20 ENCOUNTER — Other Ambulatory Visit: Payer: Self-pay

## 2021-02-20 ENCOUNTER — Ambulatory Visit (HOSPITAL_BASED_OUTPATIENT_CLINIC_OR_DEPARTMENT_OTHER)
Admission: RE | Admit: 2021-02-20 | Discharge: 2021-02-20 | Disposition: A | Discharge status: Home / Self Care | Payer: Medicare HMO | Source: Ambulatory Visit | Attending: Hematology & Oncology | Admitting: Hematology & Oncology

## 2021-02-20 DIAGNOSIS — C3411 Malignant neoplasm of upper lobe, right bronchus or lung: Secondary | ICD-10-CM | POA: Diagnosis not present

## 2021-02-20 IMAGING — MR MR HEAD WO/W CM
9 of 10 series · 42 of 48 positions shown · IV contrast (gadavist)
Comparison: Head CT [DATE], most recent brain MRI [DATE]

CLINICAL DATA: Metastatic non-small cell lung cancer, assess
treatment response. Status post resection and radiosurgery for brain
Mets

EXAM:
MRI HEAD WITHOUT AND WITH CONTRAST
TECHNIQUE: Multiplanar, multiecho pulse sequences of the brain and surrounding
structures were obtained without and with intravenous contrast.
CONTRAST:  7mL GADAVIST GADOBUTROL 1 MMOL/ML IV SOLN

[Series 2: T1 · sagittal · 5.0mm · 0.45mm/px · 3 of 23 slices shown]
[im 1/23]
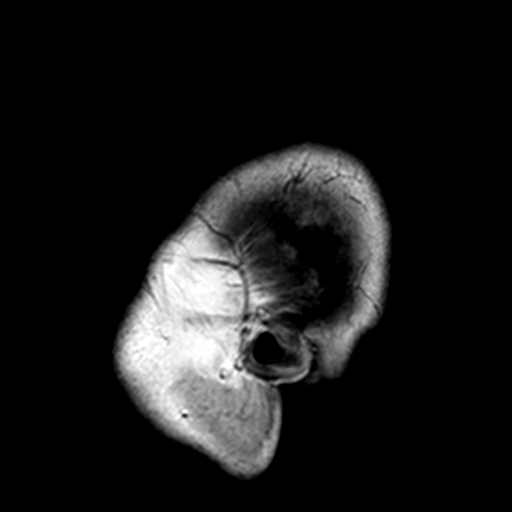
[im 12/23]
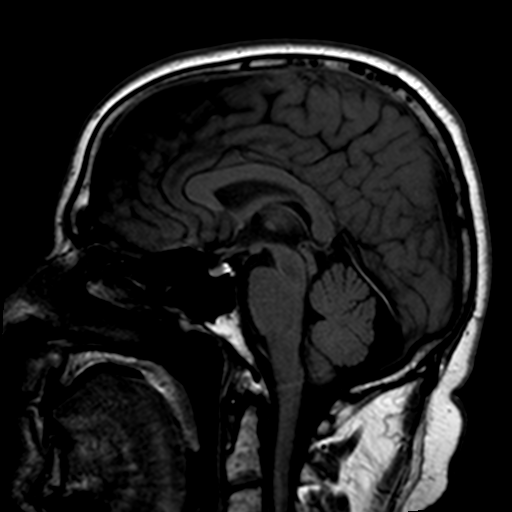
[im 23/23]
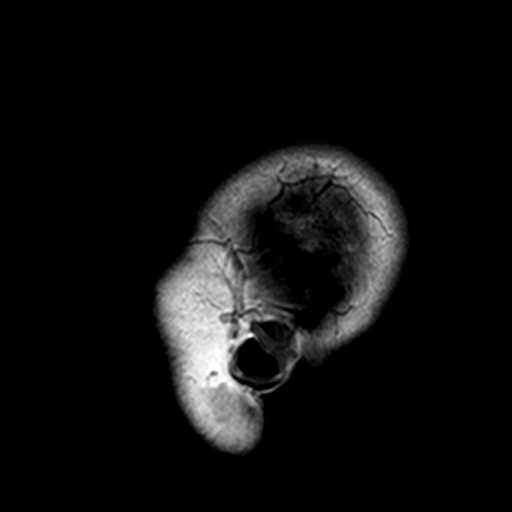

[Series 3: DWI · axial · 3.0mm · 1.86mm/px · z∈[-60,+85]mm · 9 of 90 slices shown (1 of 2)]
[im 1/90]
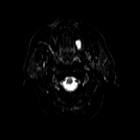
[im 12/90]
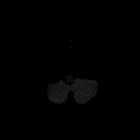
[im 23/90]
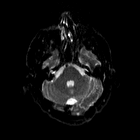
[im 34/90]
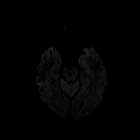
[im 45/90]
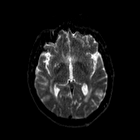
[im 56/90]
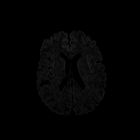
[im 67/90]
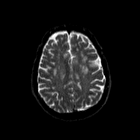
[im 78/90]
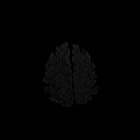
[im 90/90]
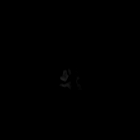

[Series 4: DWI · axial · 3.0mm · 1.86mm/px · z∈[-60,+85]mm · 4 of 43 slices shown (2 of 2)]
[im 1/43]
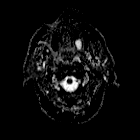
[im 15/43]
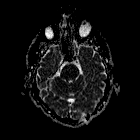
[im 29/43]
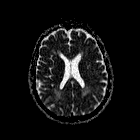
[im 43/43]
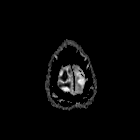

[Series 5: T2 · axial · 5.0mm · 0.45mm/px · z∈[-73,+80]mm · 2 of 23 slices shown (1 of 2)]
[im 1/23]
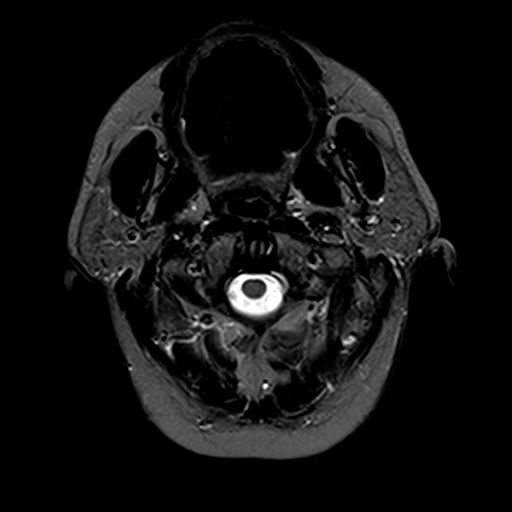
[im 23/23]
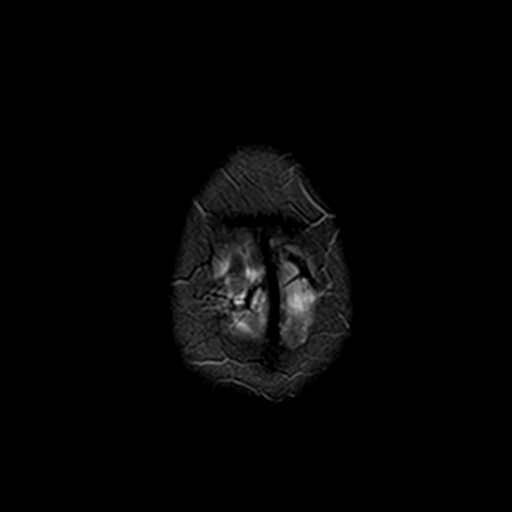

[Series 6: T2 · axial · 5.0mm · 0.45mm/px · z∈[-73,+80]mm · 2 of 23 slices shown (2 of 2)]
[im 1/23]
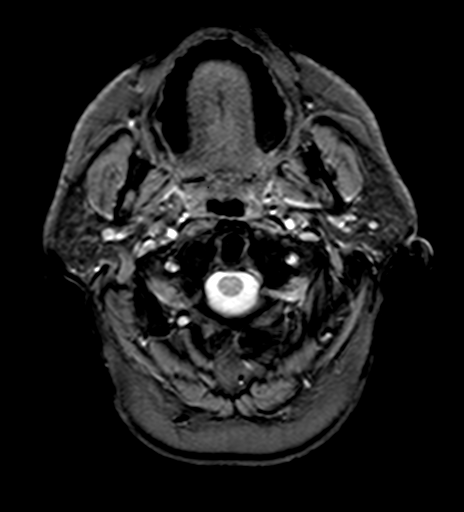
[im 23/23]
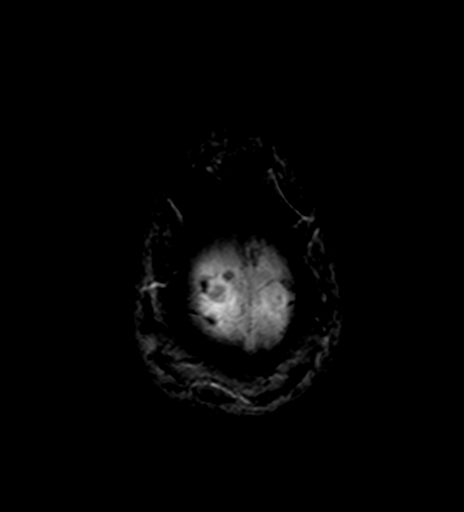

[Series 7: FLAIR · axial · 4.0mm · 0.45mm/px · z∈[-74,+81]mm · 2 of 27 slices shown]
[im 1/27]
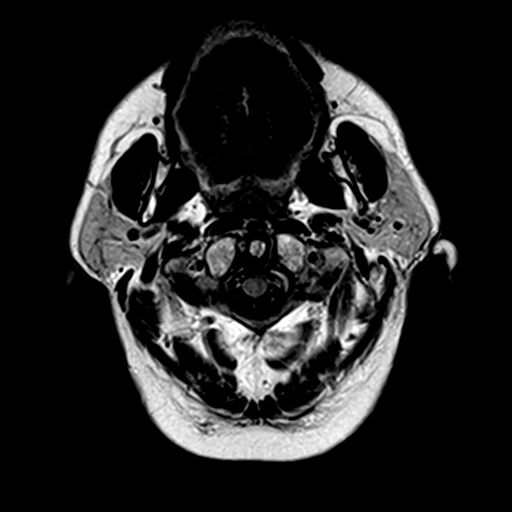
[im 27/27]
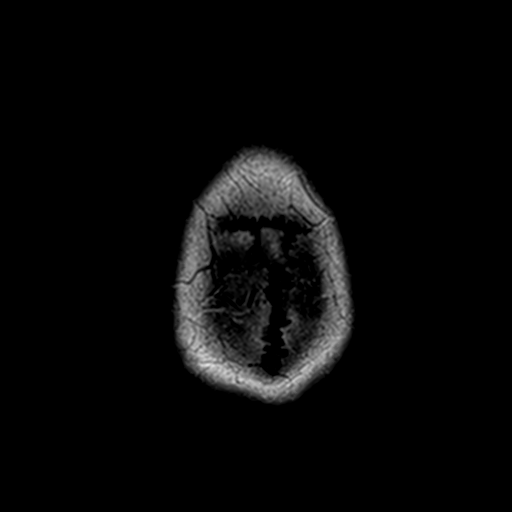

[Series 9: T2 post-contrast · coronal · 5.0mm · 0.45mm/px · 2 of 27 slices shown]
[im 1/27]
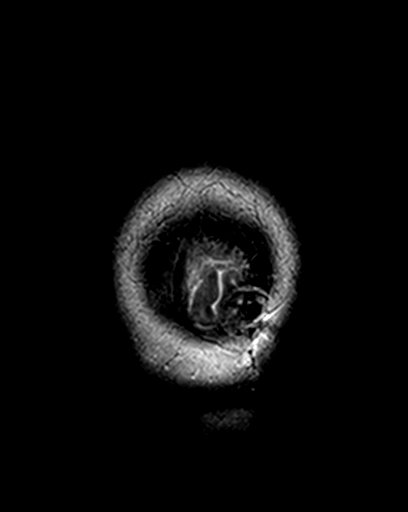
[im 27/27]
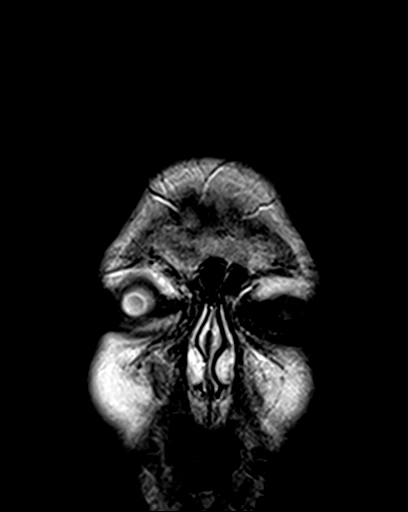

[Series 10: t1_mprage_tra post · axial · 1.0mm · 0.90mm/px · z∈[-77,+97]mm · 16 of 176 slices shown]
[im 1/176]
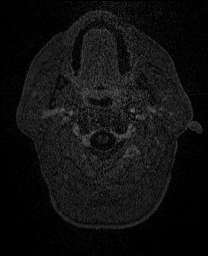
[im 12/176]
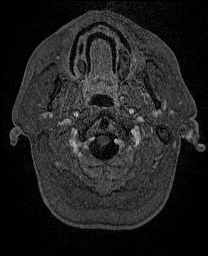
[im 24/176]
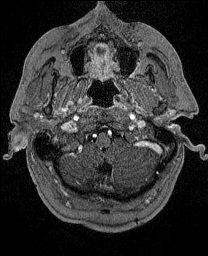
[im 36/176]
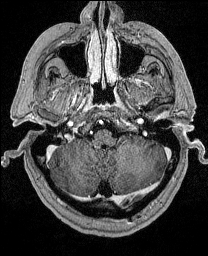
[im 47/176]
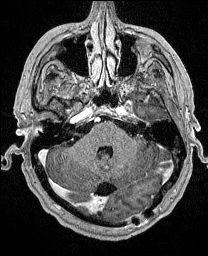
[im 59/176]
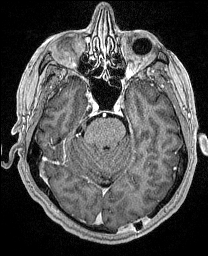
[im 71/176]
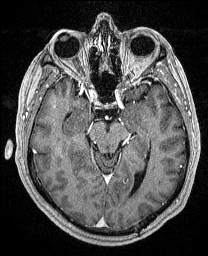
[im 82/176]
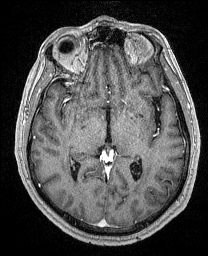
[im 94/176]
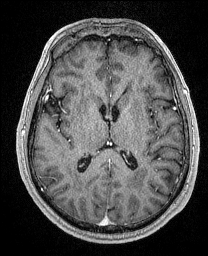
[im 106/176]
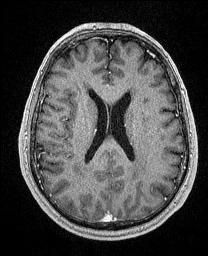
[im 117/176]
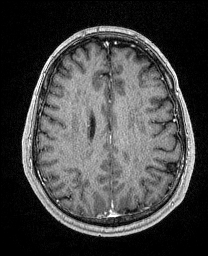
[im 129/176]
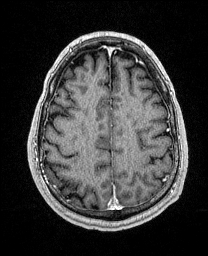
[im 141/176]
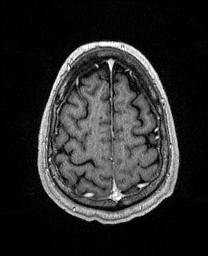
[im 152/176]
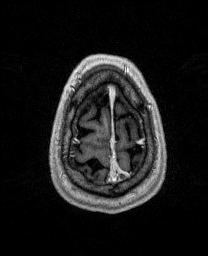
[im 164/176]
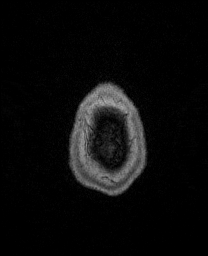
[im 176/176]
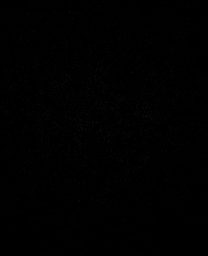

[Series 11: T1 post-contrast · coronal · 5.0mm · 0.45mm/px · 2 of 27 slices shown]
[im 1/27]
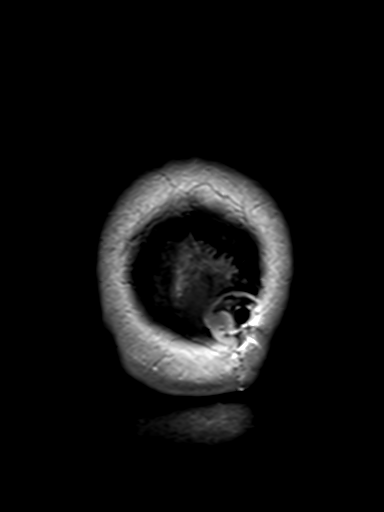
[im 27/27]
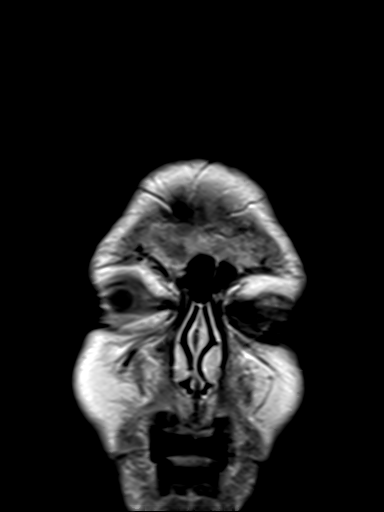

[42 of 48 positions shown; findings below may reference images not displayed]

FINDINGS: Brain: Postsurgical changes reflecting left occipital craniotomy for
mass resection are seen. There is mild encephalomalacia and gliosis
in the underlying brain parenchyma with small foci of postsurgical
blood products. There is a 3 mm focus of enhancement about the
resection site which has slightly increased in conspicuity since
[DATE]. Mild thickening and enhancement of the overlying dura is
not significantly changed.

There is a 6 mm peripherally enhancing lesion in the left middle
frontal gyrus, increased in size from approximally 1 mm on
[DATE] (10-114). A punctate focus of enhancement inferomedial to
this lesion is not significantly changed compared to the prior
study.

No other enhancing lesions are seen.

Patchy FLAIR signal abnormality in the subcortical and
periventricular white matter is unchanged, likely reflecting sequela
of chronic white matter microangiopathy. The ventricles are stable
in size. Remote lacunar infarcts in the left basal ganglia are
unchanged.

There is no midline shift.

Vascular: Normal flow voids.

Skull and upper cervical spine: There is no suspicious marrow signal
abnormality. Postsurgical changes in the left occipital bone as
above.

Sinuses/Orbits: There is a left maxillary sinus mucous retention
cyst. The globes and orbits are unremarkable.

Other: None.
IMPRESSION: Since [DATE]:

[DATE]. [DATE] mm focus of enhancement at the left occipital lobe resection
site is increased in conspicuity, concerning for residual/recurrent
metastatic disease.
2. 6 mm peripherally enhancing lesion in the left frontal gyrus has
increased in size, previously punctate. Additional punctate focus of
enhancement inferomedial to this lesion is not significantly
changed.

## 2021-02-20 MED ORDER — GADOBUTROL 1 MMOL/ML IV SOLN
7.0000 mL | Freq: Once | INTRAVENOUS | Status: AC | PRN
  Administered 2021-02-20: 7 mL via INTRAVENOUS

## 2021-02-22 ENCOUNTER — Other Ambulatory Visit: Payer: Self-pay | Admitting: Hematology & Oncology

## 2021-02-22 DIAGNOSIS — C3490 Malignant neoplasm of unspecified part of unspecified bronchus or lung: Secondary | ICD-10-CM

## 2021-02-24 ENCOUNTER — Ambulatory Visit
Admission: RE | Admit: 2021-02-24 | Discharge: 2021-02-24 | Disposition: A | Discharge status: Home / Self Care | Payer: Self-pay | Source: Ambulatory Visit | Attending: Radiation Oncology | Admitting: Radiation Oncology

## 2021-02-24 ENCOUNTER — Telehealth: Payer: Self-pay

## 2021-02-24 ENCOUNTER — Telehealth: Payer: Self-pay | Admitting: Radiation Oncology

## 2021-02-24 ENCOUNTER — Other Ambulatory Visit: Payer: Self-pay | Admitting: Radiation Therapy

## 2021-02-24 DIAGNOSIS — C7931 Secondary malignant neoplasm of brain: Secondary | ICD-10-CM

## 2021-02-24 NOTE — Telephone Encounter (Signed)
Received call from pt's wife Langley Gauss stating that pt had a PET scan at [facility] and questioning if he needs another.  Dr Marin Olp reviewed PET from 11/7. No need for another scan at this time. Langley Gauss states XRT was delaying appt until after PET. Mont Dutton notified of this change via secure chat. PET appt canceled. dph

## 2021-03-08 ENCOUNTER — Ambulatory Visit (HOSPITAL_COMMUNITY): Payer: Medicare HMO

## 2021-03-08 ENCOUNTER — Inpatient Hospital Stay: Payer: Medicare HMO | Attending: Hematology & Oncology

## 2021-03-08 NOTE — Progress Notes (Signed)
Histology and Location of Primary Cancer:  Stage IIB (pT3N0M0) NSCLC, poorly differentiated favor adeno (VPI, LVI+, G3); 1% of tumor cells are positive for PD-L1; EGFR neg; ALK negative  Location(s) of Symptomatic tumor(s):  MRI Brain w/ & w/o Contrast 02/20/2021 --IMPRESSION: Since 12/17/2020: 3 mm focus of enhancement at the left occipital lobe resection site is increased in conspicuity, concerning for residual/recurrent metastatic disease. 6 mm peripherally enhancing lesion in the left frontal gyrus has increased in size, previously punctate. Additional punctate focus of enhancement inferomedial to this lesion is not significantly changed.  Past/Anticipated chemotherapy by medical oncology, if any:  Dr. [name]  01/27/2021 I think that for right now, we really need to hold off on any treatment on him.  Again, not sure what we would be accomplishing treating him right now without any measurable disease.   I am sure that at some point, he will have measurable disease.  At that point we can definitely institute therapy.   He would be okay with this. He would like to have his MRI done here of the brain.  It somewhat easier for him to have it done here.  We will see about getting this set up for him after Thanksgiving I would like to see him back after he has the MRI done Would likely will do another PET scan probably in January  Under care of Dr. Monica Martinez ([facility]) 01/15/2021 --Treatment: 09/02/2020: Left upper lobectomy, mediastinal lymphadenectomy 09/29/2020: 1 cycle adjuvant treatment per [facility] clinical protocol 11/16/2020: left occipito-parietal craniotomy of brain met 12/25/2020: GKRS to 3 lesions (found additional 3 mets) Planning to start carboplatin + pemetrexed + pembrolizumab --Assessment & Plan Metastatic NSCLC:  NGS Treatment planning with chemotherapy PET CT for restaging Brain Mets: Follows with rad onc, s/p sbrt.   Patient's main complaints related to  symptomatic tumor(s) are: occasional slight headache  Pain on a scale of 0-10 is: 0   Ambulatory status? Walker? Wheelchair?: ambulatory without assistance  SAFETY ISSUES: Prior radiation? Yes: 12/25/2020 (Dr. Margreta Journey Cramer--[facility]) --LESION TREATED:  1. L frontal (20 Gy to the 75% isodose line) 2. Cavity (16 Gy to the 55% isodose line) --COLLIMATOR SIZE: 4, 8 and 16 mm  --TOTAL NUMBER OF ISOCENTERS: 29  Pacemaker/ICD? no Possible current pregnancy? N/A Is the patient on methotrexate? no  Additional Complaints / other details:  none  Vitals:   03/09/21 1240  BP: (!) 154/90  Pulse: 89  Resp: 18  Temp: (!) 97.2 F (36.2 C)  SpO2: 100%  Weight: 171 lb (77.6 kg)  Height: 6' (1.829 m)

## 2021-03-08 NOTE — Progress Notes (Signed)
Radiation Oncology         [phone] ________________________________  Initial Outpatient Consultation  Name: Bill Garza MRN: [id number]  Date: 03/09/2021  DOB: [birth year]  OZ:DGUYQ, [name], NP  [name], MD   REFERRING PHYSICIAN: [name], MD  DIAGNOSIS: C79.31   ICD-10-CM   1. Non-small cell lung cancer metastatic to brain ([place])  C34.90 LORazepam (ATIVAN) 1 MG tablet   C79.31     2. Brain metastases ([place])  C79.31       Metastatic NSCLC with brain metastases    Cancer Staging  Non-small cell lung cancer metastatic to brain [facility]) Staging form: Lung, AJCC 8th Edition - Clinical stage from 01/27/2021: Stage IV (cT3, cN0, cM1) - Signed by [name], MD on 01/27/2021 Stage prefix: Initial diagnosis Histologic grade (G): G2 Histologic grading system: 4 grade system   CHIEF COMPLAINT: Here to discuss management of metastatic lung cancer to the brain.  HISTORY OF PRESENT ILLNESS::Bill Garza is a 60 y.o. male who was found to have a lung nodule on a routine scan (date unknown). The patient accordingly underwent VATS resection on 09/02/20 which revealed stage IIb (T3N0M0) non-small cell lung cancer,  adenocarcinoma.    The patient was then treated with adjuvant chemotherapy consisting of cisplatin/Alimta/Keytruda.  Per [name], he apparently only had 1 cycle of treatment and it is unknown if the patient had any toxicities.   CT of the head on 11/13/20 prompted due to headache, revealed a new left occipital mass concerning for metastasis. Mild degree of surrounding vasogenic edema and local mass effect were also appreciated.   MRI of the brain on 11/14/20 again revealed the enhancing left occipital lobe lesion concerning for metastatic disease.   On 11/16/20, the patient underwent resection of the  left occipital brain lesion- left occipital tumor revealed metastatic poorly differentiated carcinoma.   He subsequently started systemic therapy this  past October consisting of carboplatinum/Alimta/pembrolizumab.  This was in October (further details unknown).   MRI of the brain on 11/17/20 showed expected postsurgical changes of the left occipital metastatic lesion resection with thin smooth enhancement along the margins of the surgical resection cavity. No specific evidence of residual neoplasm was appreciated, and no new enhancing lesions were identified.  CT of the head on 11/28/20 showed no acute intracranial abnormalities.   Chest CT on 11/29/20 showed interval postoperative changes of left upper lobe lobectomy and mediastinal lymphadenectomy. A similar 4 mm nodule was seen in the right lower lobe, as well as a nodule which measured 5.3, previously 4.8 mm. No evidence of enlarging, suspicious pulmonary nodules or mediastinal lymphadenopathy to suggest recurrence were appreciated, or definite evidence of metastatic disease in the abdomen or pelvis.   MRI of the brain on 12/02/20 showed postsurgical changes of left occipito-parietal craniotomy (for resection of a occipital lobe metastatic lesion),  with markedly decreased surrounding edema. No increasing nodular or masslike enhancements to suggest recurrence were seen.  MRI of the brain on 12/17/20 revealed two tiny closely approximated enhancing lesions near the junction of the left middle and inferior frontal gyri, concerning for new sites of metastatic disease. No substantial surrounding edema or local mass effect appreciated. Otherwise, normal post surgical changes were seen in respects to recent mass resectioning. He was seen by Dr. Oneita Kras of [facility] rad/onc.  Recommendations made for Gamma Knife SRS to the  left occipital tumor bed and left frontal lobe small metastases x 2.    On 12/25/20 Gamma knife was  performed - "L frontal" (20Gy to 75% isodose line); "Cavity" (16 Gy to 55% isodose line). I personally reviewed his [place] records / images from [facility]. Dr. Salomon Fick was his neurosurgeon, who worked  with Dr. Oneita Kras.  PET on 01/25/21 showed no evidence of recurrent or metastatic lung malignancy. Mild increase in activity was seen in atelectasis or post surgical changes and pleural thickening in the posterior left lung base.   The patient was referred to [name] on 01/27/21 for further evaluation. Per [name], it is unknown why the patient received systemic treatment given that there does not appear to be any obvious systemic therapy that is measurable. Subsequently, [name] recommended holding off on any treatment for him given that there is no  measurable disease.  02/20/21 MRI brain ordered by Dr Marin Olp was review at CNS tumor board this week.  It reveals 37m focus of enhancement at the left occipital lobe resection site and 6 mm peripherally enhancing lesion in the left frontal gyrus has increased in size, previously punctate. Additional punctate focus of enhancement inferomedial to this lesion is not significantly changed.  Consensus at tumor board is that the MRI shows post treatment effects and that he should consider close monitoring.  The patient and his wife are adamant to transfer all care closer to home.   Patient's main complaints related to symptomatic tumor(s) are: occasional slight headache  Pain on a scale of 0-10 is: 0   Ambulatory status? Walker? Wheelchair?: ambulatory without assistance  Pacemaker/ICD? no Possible current pregnancy? N/A Is the patient on methotrexate? no  PREVIOUS RADIATION THERAPY: Yes, stereotactic radiosurgery for 2 smaller brain lesions  PAST MEDICAL HISTORY:  has a past medical history of Chronic back pain, Chronic GERD, Chronic pain, COPD (chronic obstructive pulmonary disease) ([place], Coronary artery disease, Depression, Diabetes mellitus without complication ([place], Gastroparesis, Goals of care, counseling/discussion (01/27/2021), High risk medication use, Hyperlipidemia, MI (myocardial infarction) ([place], Non-small cell lung cancer  metastatic to brain ([place] (01/27/2021), and Restless leg syndrome.    PAST SURGICAL HISTORY: Past Surgical History:  Procedure Laterality Date   BACK SURGERY     CARDIAC CATHETERIZATION      FAMILY HISTORY: family history is not on file.  SOCIAL HISTORY:  reports that he quit smoking about 7 months ago. His smoking use included cigarettes. He has a 40.00 pack-year smoking history. He has never used smokeless tobacco. He reports that he does not currently use alcohol. He reports that he does not currently use drugs.  ALLERGIES: Gabapentin, Pregabalin, Bupropion, Duloxetine, Ezetimibe, Levetiracetam, Other, Prochlorperazine, and Lamotrigine  MEDICATIONS:  Current Outpatient Medications  Medication Sig Dispense Refill   ACCU-CHEK GUIDE test strip CHECK BLOOD SUGAR DAILY     acetaminophen (TYLENOL) 325 MG tablet Take by mouth.     albuterol (PROVENTIL) (2.5 MG/3ML) 0.083% nebulizer solution      albuterol (VENTOLIN HFA) 108 (90 Base) MCG/ACT inhaler      aspirin EC 81 MG tablet Take 81 mg by mouth daily.     Bempedoic Acid (NEXLETOL) 180 MG TABS Take 1 tablet by mouth daily.     Blood Glucose Monitoring Suppl (ACCU-CHEK GUIDE) w/Device KIT USE AS DIRECTED TO CHECK BLOOD SUGAR     Blood Glucose Monitoring Suppl (GLUCOCOM BLOOD GLUCOSE MONITOR) DEVI Use to check blood sugar daily.     budesonide-formoterol (SYMBICORT) 160-4.5 MCG/ACT inhaler Inhale 2 puffs into the lungs 2 (two) times daily.     cyclobenzaprine (FLEXERIL) 5 MG tablet Take 5 mg  by mouth 3 (three) times daily as needed for muscle spasms.     escitalopram (LEXAPRO) 5 MG tablet Take 5 mg by mouth daily.     fluocinolone (VANOS) 0.01 % cream Apply topically.     glucose blood (PRECISION QID TEST) test strip Use to check blood sugar daily.     Lancets MISC Use to check blood sugar daily.     LORazepam (ATIVAN) 1 MG tablet Take 1 tablet 60 min prior to PET or MRI, and one more tablet 30 minutes prior to imaging if needed for  anxiety. (Take 1- 2 tablets PRN anxiety prior to imaging). 8 tablet 0   metFORMIN (GLUCOPHAGE) 500 MG tablet Take by mouth daily.     montelukast (SINGULAIR) 10 MG tablet Take 10 mg by mouth at bedtime.     Multiple Vitamin (MULTIVITAMIN) capsule Take 1 capsule by mouth daily.     naloxone (NARCAN) nasal spray 4 mg/0.1 mL      nitroGLYCERIN (NITROSTAT) 0.4 MG SL tablet      pravastatin (PRAVACHOL) 80 MG tablet Take 80 mg by mouth daily.     citalopram (CELEXA) 10 MG tablet Take 1 tablet (10 mg total) by mouth daily. (Patient not taking: Reported on 03/09/2021) 30 tablet 3   dronabinol (MARINOL) 5 MG capsule Take 5 mg by mouth 4 (four) times daily. (Patient not taking: Reported on 25/07/3974)     folic acid (FOLVITE) 1 MG tablet Take 1 mg by mouth daily. (Patient not taking: Reported on 03/09/2021)     furosemide (LASIX) 20 MG tablet Take 40 mg by mouth daily. (Patient not taking: Reported on 03/09/2021)     metoCLOPramide (REGLAN) 10 MG tablet Take 10 mg by mouth 4 (four) times daily. (Patient not taking: Reported on 03/09/2021)     oxyCODONE 10 MG TABS Take 0.5 tablets (5 mg total) by mouth every 4 (four) hours as needed for severe pain. (Patient not taking: Reported on 03/09/2021) 90 tablet 0   promethazine (PHENERGAN) 25 MG suppository Place 1 suppository (25 mg total) rectally every 6 (six) hours as needed for nausea or vomiting. (Patient not taking: Reported on 01/27/2021) 12 each 0   SENNA S 8.6-50 MG tablet Take 2 tablets by mouth at bedtime. (Patient not taking: Reported on 03/09/2021)     No current facility-administered medications for this encounter.    REVIEW OF SYSTEMS:  Notable for that above.   PHYSICAL EXAM:  height is 6' (1.829 m) and weight is 171 lb (77.6 kg). His temperature is 97.2 F (36.2 C) (abnormal). His blood pressure is 154/90 (abnormal) and his pulse is 89. His respiration is 18 and oxygen saturation is 100%.   General: Alert and oriented, in no acute distress   Musculoskeletal: independently ambulatory. Neurologic:  No obvious focalities. Speech is fluent. Coordination is intact. Psychiatric: Judgment and insight are intact. Affect is appropriate.  KPS = 90  100 - Normal; no complaints; no evidence of disease. 90   - Able to carry on normal activity; minor signs or symptoms of disease. 80   - Normal activity with effort; some signs or symptoms of disease. 80   - Cares for self; unable to carry on normal activity or to do active work. 60   - Requires occasional assistance, but is able to care for most of his personal needs. 50   - Requires considerable assistance and frequent medical care. 34   - Disabled; requires special care and assistance. 30   -  Severely disabled; hospital admission is indicated although death not imminent. 6   - Very sick; hospital admission necessary; active supportive treatment necessary. 10   - Moribund; fatal processes progressing rapidly. 0     - Dead  Karnofsky DA, Abelmann [place], [place]ver [place] and [place] [phone]) The use of the nitrogen mustards in the palliative treatment of carcinoma: with particular reference to bronchogenic carcinoma Cancer 1 634-56    LABORATORY DATA:  Lab Results  Component Value Date   WBC 4.7 03/09/2021   HGB 11.2 (L) 03/09/2021   HCT 35.1 (L) 03/09/2021   MCV 90.0 03/09/2021   PLT 408 (H) 03/09/2021   CMP     Component Value Date/Time   NA 139 03/09/2021 0845   K 4.1 03/09/2021 0845   CL 106 03/09/2021 0845   CO2 23 03/09/2021 0845   GLUCOSE 175 (H) 03/09/2021 0845   BUN 9 03/09/2021 0845   CREATININE 0.81 03/09/2021 0845   CALCIUM 9.3 03/09/2021 0845   PROT 7.1 03/09/2021 0845   ALBUMIN 3.8 03/09/2021 0845   AST 22 03/09/2021 0845   ALT 19 03/09/2021 0845   ALKPHOS 44 03/09/2021 0845   BILITOT 0.3 03/09/2021 0845   GFRNONAA >60 03/09/2021 0845   GFRAA >60 07/12/2018 2034         RADIOGRAPHY: MR Brain W Wo Contrast  Result Date: 02/22/2021 CLINICAL DATA:   Metastatic non-small cell lung cancer, assess treatment response. Status post resection and radiosurgery for brain Mets EXAM: MRI HEAD WITHOUT AND WITH CONTRAST TECHNIQUE: Multiplanar, multiecho pulse sequences of the brain and surrounding structures were obtained without and with intravenous contrast. CONTRAST:  58m GADAVIST GADOBUTROL 1 MMOL/ML IV SOLN COMPARISON:  Head CT 12/25/2020, most recent brain MRI 12/17/2020 FINDINGS: Brain: Postsurgical changes reflecting left occipital craniotomy for mass resection are seen. There is mild encephalomalacia and gliosis in the underlying brain parenchyma with small foci of postsurgical blood products. There is a 3 mm focus of enhancement about the resection site which has slightly increased in conspicuity since 12/17/2020. Mild thickening and enhancement of the overlying dura is not significantly changed. There is a 6 mm peripherally enhancing lesion in the left middle frontal gyrus, increased in size from approximally 1 mm on 12/17/2020 (10-114). A punctate focus of enhancement inferomedial to this lesion is not significantly changed compared to the prior study. No other enhancing lesions are seen. Patchy FLAIR signal abnormality in the subcortical and periventricular white matter is unchanged, likely reflecting sequela of chronic white matter microangiopathy. The ventricles are stable in size. Remote lacunar infarcts in the left basal ganglia are unchanged. There is no midline shift. Vascular: Normal flow voids. Skull and upper cervical spine: There is no suspicious marrow signal abnormality. Postsurgical changes in the left occipital bone as above. Sinuses/Orbits: There is a left maxillary sinus mucous retention cyst. The globes and orbits are unremarkable. Other: None. IMPRESSION: Since 12/17/2020: 1. 3 mm focus of enhancement at the left occipital lobe resection site is increased in conspicuity, concerning for residual/recurrent metastatic disease. 2. 6 mm  peripherally enhancing lesion in the left frontal gyrus has increased in size, previously punctate. Additional punctate focus of enhancement inferomedial to this lesion is not significantly changed. Electronically Signed   By: PValetta MoleM.D.   On: 02/22/2021 10:56      IMPRESSION/PLAN:  This is a delightful 60yo man with history of metastatic lung cancer to brain.  S/p GK SRS as above.  Desires follow-up here, closer to  home.  Adamant to transfer his care from [facility] and understands pros/cons of transferring care.  I reviewed his records from [facility] in detail as above.  Patient's case discussed at CNS tumor board.  I personally reviewed his MRIs of his Brain. I personally discussed his cased with radiology today upon accessing his complete records.  I recommend MRI at 6 week interval from prior to closely follow his lesions which I suspect show treatment effect. If further imaging is reassuring, we will stretch our scans a 4mointervals. He and his wife are pleased with this plan. I'll see them back in January.  On date of service, in total, I spent 60 minutes on this encounter. Patient was seen in person.   __________________________________________   SEppie Gibson MD  This document serves as a record of services personally performed by SEppie Gibson MD. It was created on her behalf by ERoney Mans a trained medical scribe. The creation of this record is based on the scribe's personal observations and the provider's statements to them. This document has been checked and approved by the attending provider.

## 2021-03-09 ENCOUNTER — Other Ambulatory Visit: Payer: Self-pay

## 2021-03-09 ENCOUNTER — Inpatient Hospital Stay (HOSPITAL_BASED_OUTPATIENT_CLINIC_OR_DEPARTMENT_OTHER): Payer: Medicare HMO | Admitting: Hematology & Oncology

## 2021-03-09 ENCOUNTER — Ambulatory Visit
Admission: RE | Admit: 2021-03-09 | Discharge: 2021-03-09 | Disposition: A | Discharge status: Home / Self Care | Payer: Medicare HMO | Source: Ambulatory Visit | Attending: Radiation Oncology | Admitting: Radiation Oncology

## 2021-03-09 ENCOUNTER — Encounter: Payer: Self-pay | Admitting: Hematology & Oncology

## 2021-03-09 ENCOUNTER — Other Ambulatory Visit: Payer: Self-pay | Admitting: Radiation Therapy

## 2021-03-09 ENCOUNTER — Telehealth: Payer: Self-pay | Admitting: *Deleted

## 2021-03-09 ENCOUNTER — Inpatient Hospital Stay: Payer: Medicare HMO

## 2021-03-09 ENCOUNTER — Encounter: Payer: Self-pay | Admitting: Radiation Oncology

## 2021-03-09 VITALS — BP 142/82 | HR 89 | Temp 98.0°F | Resp 18 | Wt 170.5 lb

## 2021-03-09 VITALS — BP 154/90 | HR 89 | Temp 97.2°F | Resp 18 | Ht 72.0 in | Wt 171.0 lb

## 2021-03-09 DIAGNOSIS — Z87891 Personal history of nicotine dependence: Secondary | ICD-10-CM | POA: Diagnosis not present

## 2021-03-09 DIAGNOSIS — C7931 Secondary malignant neoplasm of brain: Secondary | ICD-10-CM

## 2021-03-09 DIAGNOSIS — C3490 Malignant neoplasm of unspecified part of unspecified bronchus or lung: Secondary | ICD-10-CM

## 2021-03-09 DIAGNOSIS — Z7982 Long term (current) use of aspirin: Secondary | ICD-10-CM | POA: Insufficient documentation

## 2021-03-09 DIAGNOSIS — Z9221 Personal history of antineoplastic chemotherapy: Secondary | ICD-10-CM | POA: Diagnosis not present

## 2021-03-09 DIAGNOSIS — I252 Old myocardial infarction: Secondary | ICD-10-CM | POA: Insufficient documentation

## 2021-03-09 DIAGNOSIS — E785 Hyperlipidemia, unspecified: Secondary | ICD-10-CM | POA: Diagnosis not present

## 2021-03-09 DIAGNOSIS — I2510 Atherosclerotic heart disease of native coronary artery without angina pectoris: Secondary | ICD-10-CM | POA: Diagnosis not present

## 2021-03-09 DIAGNOSIS — M549 Dorsalgia, unspecified: Secondary | ICD-10-CM | POA: Diagnosis not present

## 2021-03-09 DIAGNOSIS — E119 Type 2 diabetes mellitus without complications: Secondary | ICD-10-CM | POA: Diagnosis not present

## 2021-03-09 DIAGNOSIS — Z7984 Long term (current) use of oral hypoglycemic drugs: Secondary | ICD-10-CM | POA: Diagnosis not present

## 2021-03-09 DIAGNOSIS — K219 Gastro-esophageal reflux disease without esophagitis: Secondary | ICD-10-CM | POA: Insufficient documentation

## 2021-03-09 DIAGNOSIS — Z79899 Other long term (current) drug therapy: Secondary | ICD-10-CM | POA: Diagnosis not present

## 2021-03-09 DIAGNOSIS — J449 Chronic obstructive pulmonary disease, unspecified: Secondary | ICD-10-CM | POA: Insufficient documentation

## 2021-03-09 DIAGNOSIS — C3411 Malignant neoplasm of upper lobe, right bronchus or lung: Secondary | ICD-10-CM

## 2021-03-09 LAB — CBC WITH DIFFERENTIAL (CANCER CENTER ONLY)
Abs Immature Granulocytes: 0.01 10*3/uL (ref 0.00–0.07)
Basophils Absolute: 0.1 10*3/uL (ref 0.0–0.1)
Basophils Relative: 2 %
Eosinophils Absolute: 0.2 10*3/uL (ref 0.0–0.5)
Eosinophils Relative: 4 %
HCT: 35.1 % — ABNORMAL LOW (ref 39.0–52.0)
Hemoglobin: 11.2 g/dL — ABNORMAL LOW (ref 13.0–17.0)
Immature Granulocytes: 0 %
Lymphocytes Relative: 45 %
Lymphs Abs: 2.1 10*3/uL (ref 0.7–4.0)
MCH: 28.7 pg (ref 26.0–34.0)
MCHC: 31.9 g/dL (ref 30.0–36.0)
MCV: 90 fL (ref 80.0–100.0)
Monocytes Absolute: 0.4 10*3/uL (ref 0.1–1.0)
Monocytes Relative: 9 %
Neutro Abs: 1.9 10*3/uL (ref 1.7–7.7)
Neutrophils Relative %: 40 %
Platelet Count: 408 10*3/uL — ABNORMAL HIGH (ref 150–400)
RBC: 3.9 MIL/uL — ABNORMAL LOW (ref 4.22–5.81)
RDW: 14.8 % (ref 11.5–15.5)
WBC Count: 4.7 10*3/uL (ref 4.0–10.5)
nRBC: 0 % (ref 0.0–0.2)

## 2021-03-09 LAB — CMP (CANCER CENTER ONLY)
ALT: 19 U/L (ref 0–44)
AST: 22 U/L (ref 15–41)
Albumin: 3.8 g/dL (ref 3.5–5.0)
Alkaline Phosphatase: 44 U/L (ref 38–126)
Anion gap: 10 (ref 5–15)
BUN: 9 mg/dL (ref 6–20)
CO2: 23 mmol/L (ref 22–32)
Calcium: 9.3 mg/dL (ref 8.9–10.3)
Chloride: 106 mmol/L (ref 98–111)
Creatinine: 0.81 mg/dL (ref 0.61–1.24)
GFR, Estimated: >60 mL/min (ref >60)
Glucose, Bld: 175 mg/dL — ABNORMAL HIGH (ref 70–99)
Potassium: 4.1 mmol/L (ref 3.5–5.1)
Sodium: 139 mmol/L (ref 135–145)
Total Bilirubin: 0.3 mg/dL (ref 0.3–1.2)
Total Protein: 7.1 g/dL (ref 6.5–8.1)

## 2021-03-09 LAB — FERRITIN: Ferritin: 11 ng/mL — ABNORMAL LOW (ref 24–336)

## 2021-03-09 LAB — IRON AND IRON BINDING CAPACITY (CC-WL,HP ONLY)
Iron: 44 ug/dL — ABNORMAL LOW (ref 45–182)
Saturation Ratios: 9 % — ABNORMAL LOW (ref 17.9–39.5)
TIBC: 512 ug/dL — ABNORMAL HIGH (ref 250–450)
UIBC: 468 ug/dL — ABNORMAL HIGH (ref 117–376)

## 2021-03-09 LAB — LACTATE DEHYDROGENASE: LDH: 122 U/L (ref 98–192)

## 2021-03-09 MED ORDER — LORAZEPAM 1 MG PO TABS: ORAL_TABLET | ORAL | 0 refills | Status: DC

## 2021-03-09 NOTE — Progress Notes (Signed)
Hematology and Oncology Follow Up Visit  Cerrone Debold [id number] [birth year] 60 y.o. 03/09/2021   Principle Diagnosis:  Metastatic adenocarcinoma of the lung-CNS metastasis  Current Therapy:   Status post radiosurgery-likely radiosurgery needed again     Interim History:  Mr. Genest is back for follow-up.  This is a second office visit.  We first saw him back on 01/27/2021.  At that time, he came over from [place].  He lives in [place].  His lab more convenient for him to see [place].  He did have radiosurgery for a couple CNS metastasis.  He had previously also had a left occipital lesion that was resected.  MRI of the brain.  This did not show a 3 mm focus in the left occipital lobe.  He had a 6 mm lesion left middle frontal gyrus.  He sees Radiation Oncology today.  I do think that we are going to have to get a PET scan on him to see if he has any measurable metastatic disease.  He does have little bit of anxiety.  He has had no problems with cough or shortness of breath.  He had did have a nice Thanksgiving.  His appetite is doing pretty well.  He has had no nausea or vomiting.  There has been no change in bowel bladder habits.  There has been no leg swelling.  He has had no rashes.  There has been no visual changes.  Overall, his performance status is ECOG 1.  Medications:  Current Outpatient Medications:    ACCU-CHEK GUIDE test strip, CHECK BLOOD SUGAR DAILY, Disp: , Rfl:    aspirin EC 81 MG tablet, Take 81 mg by mouth daily., Disp: , Rfl:    Bempedoic Acid (NEXLETOL) 180 MG TABS, Take 1 tablet by mouth daily., Disp: , Rfl:    Blood Glucose Monitoring Suppl (ACCU-CHEK GUIDE) w/Device KIT, USE AS DIRECTED TO CHECK BLOOD SUGAR, Disp: , Rfl:    Blood Glucose Monitoring Suppl (GLUCOCOM BLOOD GLUCOSE MONITOR) DEVI, Use to check blood sugar daily., Disp: , Rfl:    budesonide-formoterol (SYMBICORT) 160-4.5 MCG/ACT inhaler, Inhale 2 puffs into the lungs 2 (two) times daily., Disp: ,  Rfl:    cyclobenzaprine (FLEXERIL) 5 MG tablet, Take 5 mg by mouth 3 (three) times daily as needed for muscle spasms., Disp: , Rfl:    escitalopram (LEXAPRO) 5 MG tablet, Take 5 mg by mouth daily., Disp: , Rfl:    fluocinolone (VANOS) 0.01 % cream, Apply topically., Disp: , Rfl:    glucose blood (PRECISION QID TEST) test strip, Use to check blood sugar daily., Disp: , Rfl:    Lancets MISC, Use to check blood sugar daily., Disp: , Rfl:    metFORMIN (GLUCOPHAGE) 500 MG tablet, Take by mouth daily., Disp: , Rfl:    pravastatin (PRAVACHOL) 80 MG tablet, Take 80 mg by mouth daily., Disp: , Rfl:    acetaminophen (TYLENOL) 325 MG tablet, Take by mouth. (Patient not taking: Reported on 03/09/2021), Disp: , Rfl:    albuterol (PROVENTIL) (2.5 MG/3ML) 0.083% nebulizer solution, 1 vial every 4-6 hours as needed for shortness of breath. (Patient not taking: Reported on 03/09/2021), Disp: , Rfl:    albuterol (VENTOLIN HFA) 108 (90 Base) MCG/ACT inhaler, INHALE 2 PUFFS INTO LUNGS EVERY 4 HOURS AS NEEDED SHORTNESS OF BREATH (Patient not taking: Reported on 03/09/2021), Disp: , Rfl:    citalopram (CELEXA) 10 MG tablet, Take 1 tablet (10 mg total) by mouth daily., Disp: 30 tablet, Rfl: 3  dronabinol (MARINOL) 5 MG capsule, Take 5 mg by mouth 4 (four) times daily., Disp: , Rfl:    folic acid (FOLVITE) 1 MG tablet, Take 1 mg by mouth daily., Disp: , Rfl:    furosemide (LASIX) 20 MG tablet, Take 40 mg by mouth daily., Disp: , Rfl:    metoCLOPramide (REGLAN) 10 MG tablet, Take 10 mg by mouth 4 (four) times daily., Disp: , Rfl:    naloxone (NARCAN) nasal spray 4 mg/0.1 mL, SMARTSIG:In Nostril Daily (Patient not taking: Reported on 03/09/2021), Disp: , Rfl:    nitroGLYCERIN (NITROSTAT) 0.4 MG SL tablet, DISSOLVE ONE TABLET UNDER TONGUE EVERY 5 MINUTES AS NEEDED UP TO 3 DOSES, IF NORELIEF CALL 911 (Patient not taking: Reported on 01/27/2021), Disp: , Rfl:    oxyCODONE 10 MG TABS, Take 0.5 tablets (5 mg total) by mouth  every 4 (four) hours as needed for severe pain., Disp: 90 tablet, Rfl: 0   promethazine (PHENERGAN) 25 MG suppository, Place 1 suppository (25 mg total) rectally every 6 (six) hours as needed for nausea or vomiting. (Patient not taking: Reported on 01/27/2021), Disp: 12 each, Rfl: 0   SENNA S 8.6-50 MG tablet, Take 2 tablets by mouth at bedtime., Disp: , Rfl:   Allergies:  Allergies  Allergen Reactions   Gabapentin Swelling    Leg swelling    Pregabalin Swelling    Leg swelling    Bupropion Nausea And Vomiting   Duloxetine Other (See Comments)    Excessive sleeping   Ezetimibe Other (See Comments)   Levetiracetam Other (See Comments)    Personality changes   Other Other (See Comments)    Antibiotics / post. I-BBCW   Prochlorperazine Nausea And Vomiting   Lamotrigine Itching and Rash    Past Medical History, Surgical history, Social history, and Family History were reviewed and updated.  Review of Systems: Review of Systems  Constitutional: Negative.   HENT:  Negative.    Eyes: Negative.   Respiratory: Negative.    Cardiovascular: Negative.   Gastrointestinal: Negative.   Endocrine: Negative.   Genitourinary: Negative.    Musculoskeletal: Negative.   Skin: Negative.   Neurological: Negative.   Hematological: Negative.   Psychiatric/Behavioral:  The patient is nervous/anxious.    Physical Exam:  weight is 170 lb 8 oz (77.3 kg). His oral temperature is 98 F (36.7 C). His blood pressure is 142/82 (abnormal) and his pulse is 89. His respiration is 18 and oxygen saturation is 100%.   Wt Readings from Last 3 Encounters:  03/09/21 170 lb 8 oz (77.3 kg)  01/27/21 164 lb (74.4 kg)  07/12/18 187 lb (84.8 kg)    Physical Exam Vitals reviewed.  HENT:     Head: Normocephalic and atraumatic.  Eyes:     Pupils: Pupils are equal, round, and reactive to light.  Cardiovascular:     Rate and Rhythm: Normal rate and regular rhythm.     Heart sounds: Normal heart sounds.   Pulmonary:     Effort: Pulmonary effort is normal.     Breath sounds: Normal breath sounds.  Abdominal:     General: Bowel sounds are normal.     Palpations: Abdomen is soft.  Musculoskeletal:        General: No tenderness or deformity. Normal range of motion.     Cervical back: Normal range of motion.  Lymphadenopathy:     Cervical: No cervical adenopathy.  Skin:    General: Skin is warm and dry.  Findings: No erythema or rash.  Neurological:     Mental Status: He is alert and oriented to person, place, and time.  Psychiatric:        Behavior: Behavior normal.        Thought Content: Thought content normal.        Judgment: Judgment normal.     Lab Results  Component Value Date   WBC 4.7 03/09/2021   HGB 11.2 (L) 03/09/2021   HCT 35.1 (L) 03/09/2021   MCV 90.0 03/09/2021   PLT 408 (H) 03/09/2021     Chemistry      Component Value Date/Time   NA 139 03/09/2021 0845   K 4.1 03/09/2021 0845   CL 106 03/09/2021 0845   CO2 23 03/09/2021 0845   BUN 9 03/09/2021 0845   CREATININE 0.81 03/09/2021 0845      Component Value Date/Time   CALCIUM 9.3 03/09/2021 0845   ALKPHOS 44 03/09/2021 0845   AST 22 03/09/2021 0845   ALT 19 03/09/2021 0845   BILITOT 0.3 03/09/2021 0845      Impression and Plan: Mr. Needle is a very nice 61 year old white male.  He has a history of metastatic adenocarcinoma of the lung.  He initially presented with localized-stage IIB -adenocarcinoma of the left lung.  He was placed on clinical trial.  He only had 1 cycle of treatment.  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  Did undergo resection of a lesion back in August 2022.  He then underwent radiosurgery.  Again, we really need to see if there is any obvious systemic disease.  When he was first seen, he did have a low PD-L1.  He was negative for EGFR and ALK.  Hopefully, we will not find any systemic disease.  He will see Radiation Oncology today.  We have I will like to  see him back probably in late January.  By then, he would have finished up his radiosurgery.   [name], MD 12/20/20229:58 AM

## 2021-03-09 NOTE — Progress Notes (Signed)
See MD note for nursing evaluation. °

## 2021-03-09 NOTE — Telephone Encounter (Signed)
Per 03/09/21 los - gave upcoming appointments - confirmed

## 2021-03-10 ENCOUNTER — Telehealth: Payer: Self-pay

## 2021-03-10 NOTE — Telephone Encounter (Signed)
Called and informed patient's wife Langley Gauss (ok per DPR) of lab results, she verbalized understanding and denies any questions or concerns at this time.

## 2021-03-10 NOTE — Telephone Encounter (Signed)
-----   Message from [name], MD sent at 03/10/2021  6:48 AM EST ----- Call - the iron is quite low.  Let's try him on oral iron.  He can try OTC iron.  Bill Garza

## 2021-03-12 ENCOUNTER — Other Ambulatory Visit: Payer: Self-pay

## 2021-03-12 ENCOUNTER — Encounter (HOSPITAL_COMMUNITY)
Admission: RE | Admit: 2021-03-12 | Discharge: 2021-03-12 | Disposition: A | Discharge status: Home / Self Care | Payer: Medicare HMO | Source: Ambulatory Visit | Attending: Hematology & Oncology | Admitting: Hematology & Oncology

## 2021-03-12 DIAGNOSIS — C3490 Malignant neoplasm of unspecified part of unspecified bronchus or lung: Secondary | ICD-10-CM | POA: Insufficient documentation

## 2021-03-12 DIAGNOSIS — C7931 Secondary malignant neoplasm of brain: Secondary | ICD-10-CM | POA: Diagnosis present

## 2021-03-12 LAB — GLUCOSE, CAPILLARY: Glucose-Capillary: 123 mg/dL — ABNORMAL HIGH (ref 70–99)

## 2021-03-12 IMAGING — CT NM PET TUM IMG RESTAG (PS) SKULL BASE T - THIGH
1 of 7 series · 1 of 25 positions shown · non-contrast
Comparison: [REDACTED] PET-CT [DATE].

CLINICAL DATA: Subsequent treatment strategy for non-small cell
lung cancer.

EXAM:
NUCLEAR MEDICINE PET SKULL BASE TO THIGH
TECHNIQUE: 9.1 mCi F-18 FDG was injected intravenously. Full-ring PET imaging
was performed from the skull base to thigh after the radiotracer. CT
data was obtained and used for attenuation correction and anatomic
localization.
Fasting blood glucose: 123 mg/dl

[Series 4: ct sk_thigh 5.0 bf37 · axial · 5.0mm · 0.98mm/px · 1 of 244 slices shown]
[im 244/244  brain]
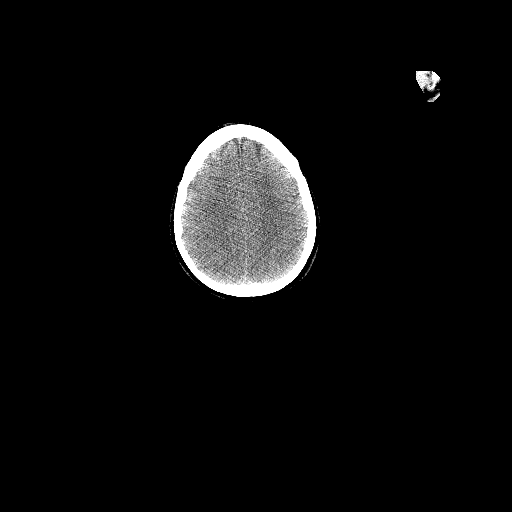

[1 of 25 positions shown; findings below may reference images not displayed]

FINDINGS: Mediastinal blood pool activity: SUV max

Liver activity: SUV max NA

NECK: No hypermetabolic lymph nodes in the neck.

Incidental CT findings: none

CHEST: No hypermetabolic mediastinal or hilar nodes. No suspicious
pulmonary nodules on the CT scan. Small focus of wedge-shaped soft
tissue attenuation at the extreme left lung base may reflect
postsurgical scarring or atelectasis. This shows low level FDG
uptake ( SUV max = 2.5) stable compared to SUV max = 2.8 previously.

Several tiny right lung nodules are evident, measuring in the 3-4 mm
size range and visible on images 94, 103 (2 in the right lower lobe
on this image), and 106 ( in the right middle lobe).

Incidental CT findings: Heart size normal. Coronary artery
calcification is evident. Mild atherosclerotic calcification is
noted in the wall of the thoracic aorta. Centrilobular emphsyema
noted. Volume loss left hemithorax compatible with prior left upper
lobectomy.

ABDOMEN/PELVIS: No abnormal hypermetabolic activity within the
liver, pancreas, adrenal glands, or spleen. No hypermetabolic lymph
nodes in the abdomen or pelvis.

Incidental CT findings: There is moderate atherosclerotic
calcification of the abdominal aorta without aneurysm. Left colonic
diverticulosis without diverticulitis.

SKELETON: No focal hypermetabolic activity to suggest skeletal
metastasis.

Incidental CT findings: No acute bony abnormality.
IMPRESSION: 1. No findings to suggest local recurrence. No evidence for
hypermetabolic metastatic disease in the neck, chest, abdomen, or
pelvis.
2. Stable low level FDG accumulation in a wedge-shaped peripheral
focus of atelectasis or scar at the left base.
3. Stable tiny right lung nodules, too small to characterize by PET
imaging. While likely benign, attention on follow-up recommended.
4.  Emphysema. ([Y9]-[Y9])
5.  Aortic Atherosclerois ([Y9]-170.0)

## 2021-03-12 MED ORDER — FLUDEOXYGLUCOSE F - 18 (FDG) INJECTION
9.1000 | Freq: Once | INTRAVENOUS | Status: AC
  Administered 2021-03-12: 14:00:00 9.1 via INTRAVENOUS

## 2021-03-16 ENCOUNTER — Telehealth: Payer: Self-pay

## 2021-03-16 NOTE — Telephone Encounter (Signed)
-----   Message from [name], MD sent at 03/16/2021 11:09 AM EST ----- Call - the PET scan looks fantastic!!!  No obvious cancer!!  Laurey Arrow

## 2021-03-16 NOTE — Telephone Encounter (Signed)
Called and LM informing patient of PET scan results (ok per DPR), requested a CB with any questions or concerns.

## 2021-03-29 ENCOUNTER — Telehealth: Payer: Self-pay | Admitting: Hematology & Oncology

## 2021-04-01 ENCOUNTER — Ambulatory Visit
Admission: RE | Admit: 2021-04-01 | Discharge: 2021-04-01 | Disposition: A | Discharge status: Home / Self Care | Payer: Medicare HMO | Source: Ambulatory Visit | Attending: Radiation Oncology | Admitting: Radiation Oncology

## 2021-04-01 ENCOUNTER — Other Ambulatory Visit: Payer: Self-pay

## 2021-04-01 DIAGNOSIS — C7931 Secondary malignant neoplasm of brain: Secondary | ICD-10-CM

## 2021-04-01 IMAGING — MR MR HEAD WO/W CM
12 series · 48 of 48 positions shown · IV contrast (multihance)
Comparison: [DATE]. [DATE]. Outside studies from [facility] and
[DATE].

CLINICAL DATA: Brain/CNS neoplasm. Assess treatment response. SRS
protocol. Metastatic non-small cell lung cancer.

EXAM:
MRI HEAD WITHOUT AND WITH CONTRAST
TECHNIQUE: Multiplanar, multiecho pulse sequences of the brain and surrounding
structures were obtained without and with intravenous contrast.
CONTRAST:  16mL MULTIHANCE GADOBENATE DIMEGLUMINE 529 MG/ML IV SOLN

[Series 2: FLAIR · sagittal · 3.0mm · 0.75mm/px · 3 of 39 slices shown (1 of 2)]
[im 1/39]
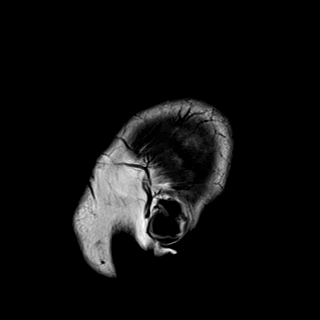
[im 20/39]
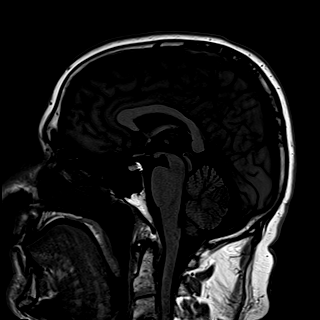
[im 39/39]
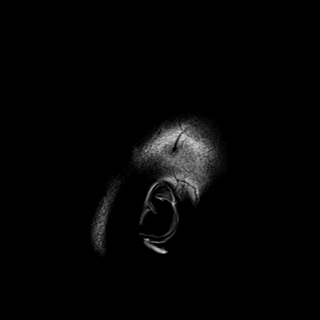

[Series 3: DWI · axial · 3.0mm · 1.50mm/px · z∈[-41,+101]mm · 4 of 78 slices shown (1 of 2)]
[im 1/78]
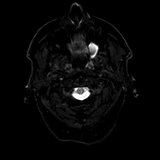
[im 26/78]
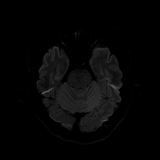
[im 52/78]
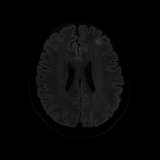
[im 78/78]
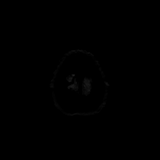

[Series 4: DWI · axial · 3.0mm · 1.50mm/px · z∈[-41,+101]mm · 2 of 39 slices shown (2 of 2)]
[im 1/39]
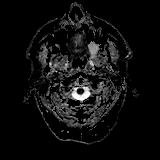
[im 39/39]
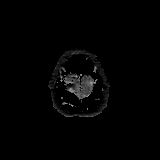

[Series 5: T2 · axial · 5.0mm · 0.57mm/px · 1 of 29 slices shown (1 of 2)]
[im 1/29]
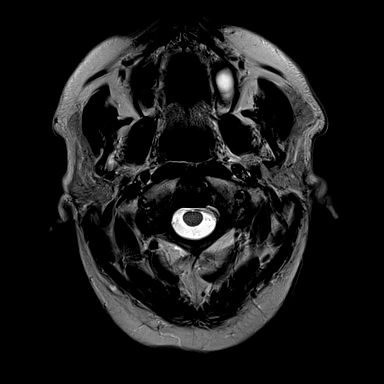

[Series 6: swi_images · axial · 1.5mm · 0.90mm/px · z∈[-43,+105]mm · 5 of 104 slices shown]
[im 1/104]
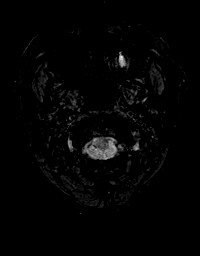
[im 26/104]
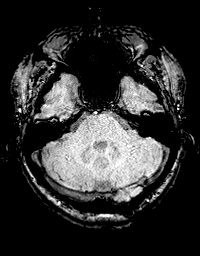
[im 52/104]
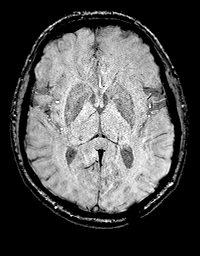
[im 78/104]
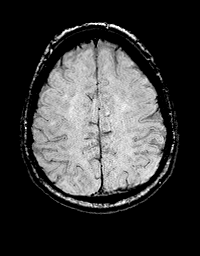
[im 104/104]
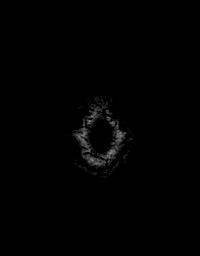

[Series 8: FLAIR · axial · 3.0mm · 0.86mm/px · z∈[-104,+82]mm · 3 of 63 slices shown (2 of 2)]
[im 1/63]
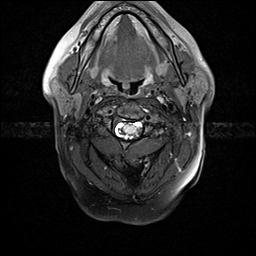
[im 32/63]
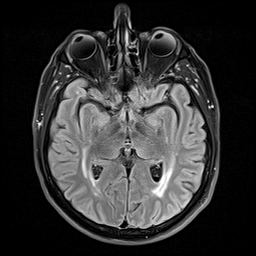
[im 63/63]
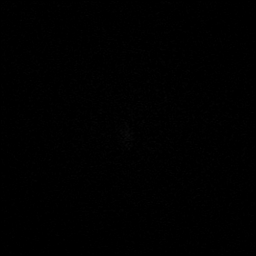

[Series 9: T2 · axial · non-contrast · 1.0mm · 0.86mm/px · z∈[-89,+82]mm · 8 of 176 slices shown (2 of 2)]
[im 1/176]
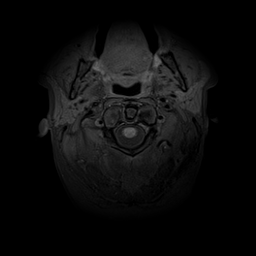
[im 26/176]
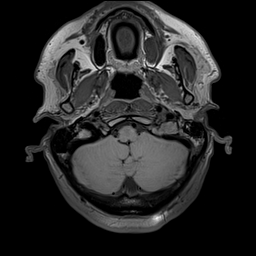
[im 51/176]
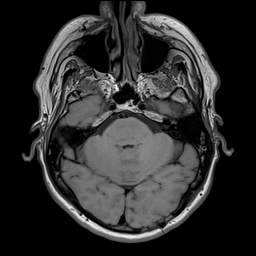
[im 76/176]
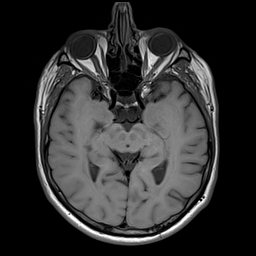
[im 101/176]
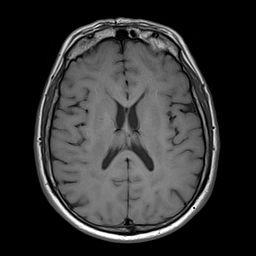
[im 126/176]
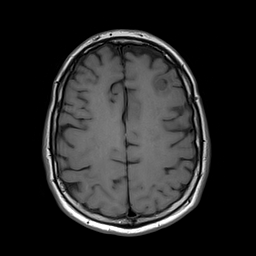
[im 151/176]
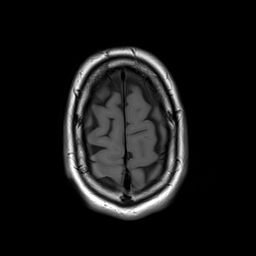
[im 176/176]
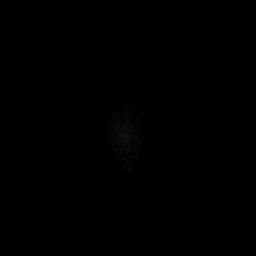

[Series 10: T2 post-contrast · coronal · 3.0mm · 0.57mm/px · 2 of 49 slices shown (1 of 2)]
[im 1/49]
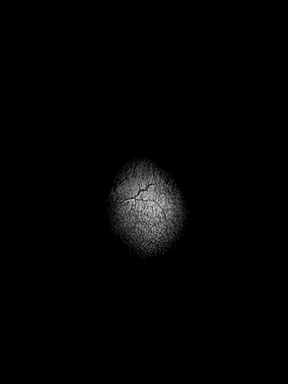
[im 49/49]
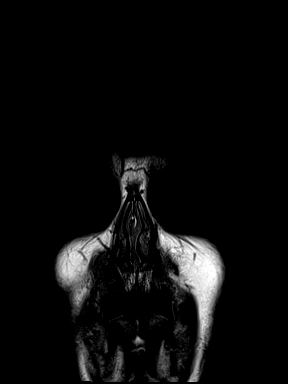

[Series 11: T2 post-contrast · axial · 1.0mm · 0.86mm/px · z∈[-89,+82]mm · 8 of 176 slices shown (2 of 2)]
[im 1/176]
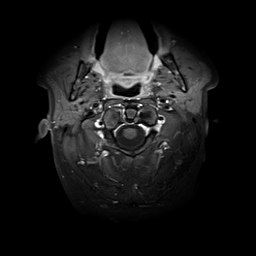
[im 26/176]
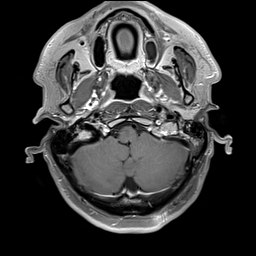
[im 51/176]
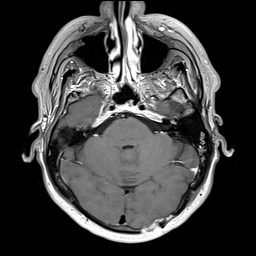
[im 76/176]
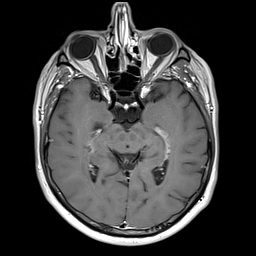
[im 101/176]
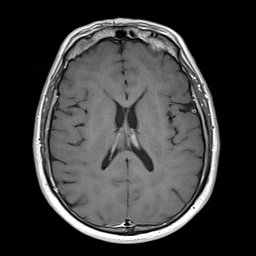
[im 126/176]
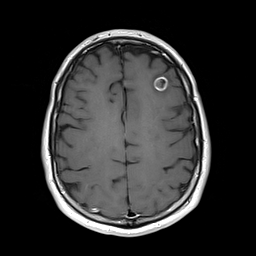
[im 151/176]
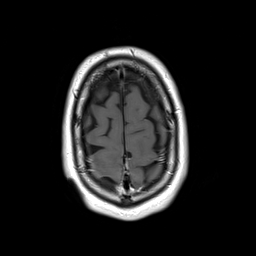
[im 176/176]
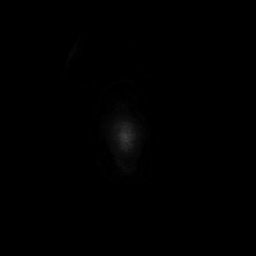

[Series 12: T1 post-contrast · axial · 1.0mm · 0.75mm/px · z∈[-83,+76]mm · 8 of 160 slices shown (1 of 2)]
[im 1/160]
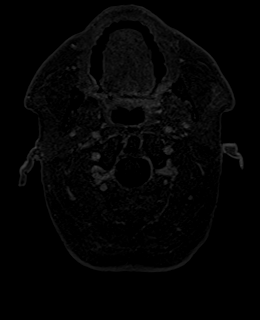
[im 23/160]
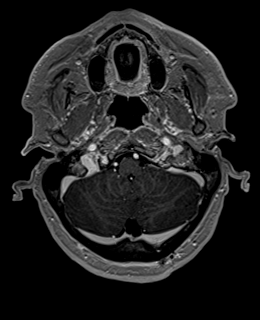
[im 46/160]
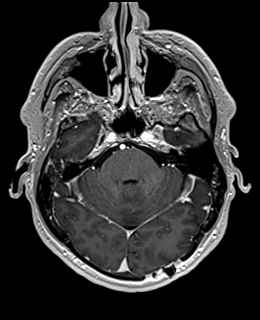
[im 69/160]
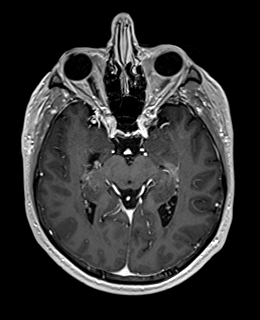
[im 91/160]
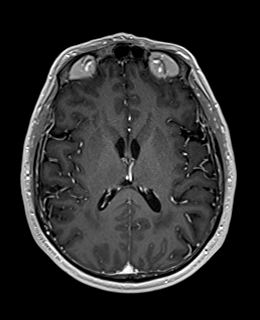
[im 114/160]
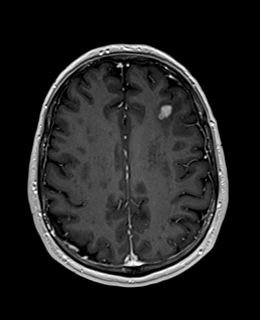
[im 137/160]
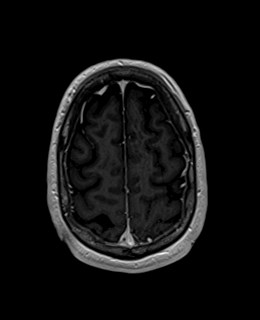
[im 160/160]
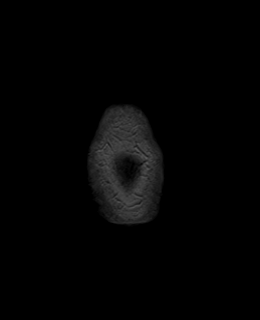

[Series 13: T1 post-contrast · coronal · 3.0mm · 0.57mm/px · 2 of 49 slices shown (2 of 2)]
[im 1/49]
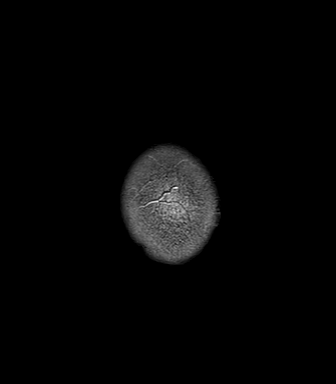
[im 49/49]
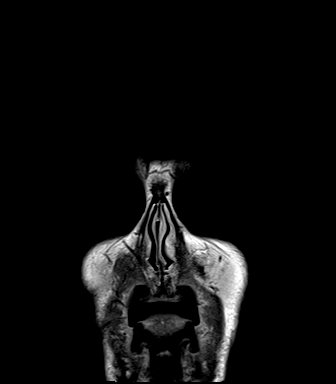

[Series 14: FLAIR post-contrast · sagittal · 3.0mm · 0.75mm/px · 2 of 39 slices shown]
[im 1/39]
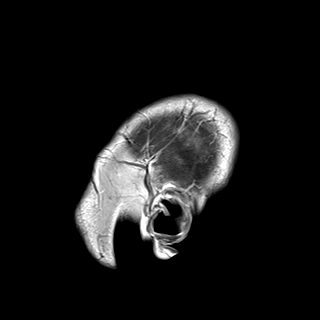
[im 39/39]
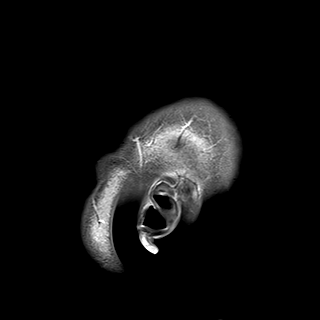

[48 of 48 positions shown; findings below may reference images not displayed]

FINDINGS: Brain: Punctate focus of enhancement in the cerebellar vermis on the
study of [DATE] has enlarged, now measuring 14 mm in diameter.
Considerable vasogenic edema within the cerebellar vermis. No fourth
ventricular compromise.

Previous left occipital craniotomy and tumor resection. Small focus
of enhancement at the resection site I think is indeterminate for
postoperative change versus recurrent tumor. I do not think that
recurrent tumor is excluded at this point, there being a somewhat
concerning 5 mm nodular focus of enhancement.

Further enlargement of a centrally necrotic enhancing lesion in the
left frontal lobe, increased from 6 mm to 12 mm. Small adjacent
lobulation or satellite focus adjacent to that measuring about 3 mm.
Whereas this could represent pseudoprogression/treatment effect, I
am or concern for actual disease progression.

No abnormality newly seen elsewhere.

Generalized atrophy with chronic small-vessel ischemic changes of
the white matter, unchanged since previous. No hydrocephalus.

Vascular: Major vessels at the base of the brain show flow.

Skull and upper cervical spine: Negative

Sinuses/Orbits: Clear sinuses. Retention cyst of the left maxillary
sinus floor. Orbits negative.

Other: None
IMPRESSION: Growth of a metastasis within the cerebellar vermis, now measuring
14 mm in diameter. Considerable regional edema.

Somewhat concerning appearance at the site of left occipital
resection. Small nodular focus enhance mint measuring up to 5 mm
could possibly be postoperative, but I think could also be some
residual/recurrent tumor. Close follow-up suggested.

Enlargement of the centrally necrotic enhancing lesion in the left
frontal lobe, increased from 6-12 mm in size. Small medial nodule or
adjacent satellite focus. Whereas this could possibly represent
pseudoprogression, true progression is the primary concern.

## 2021-04-01 MED ORDER — GADOBENATE DIMEGLUMINE 529 MG/ML IV SOLN
16.0000 mL | Freq: Once | INTRAVENOUS | Status: AC | PRN
  Administered 2021-04-01: 16 mL via INTRAVENOUS

## 2021-04-05 ENCOUNTER — Inpatient Hospital Stay: Payer: Medicare HMO | Attending: Hematology & Oncology

## 2021-04-05 NOTE — Progress Notes (Signed)
Mr. Dome presents today for follow-up since completing [facility] on on 12/25/2020 at [facility], and to review recent Brain MRI scan   Dose of Decadron, if applicable: N/A--not currently prescribed  Recent neurologic symptoms, if any:  Seizures: None Headaches: Patient denies Nausea: Patient denies Dizziness/ataxia: Patient denies Difficulty with hand coordination: Patient denies Focal numbness/weakness: Patient denies Visual deficits/changes: Patient denies Confusion/Memory deficits: Patient denies any new or worsening symptoms  Additional Complaints / other details: Main concern is acute dermatitis. Reports constant itching/discomfort to his underarms, groin, and back. States he noticed it right before Christmas time and has reached out to his PCP twice help resolve. Has F/U with medical oncologist Dr. [name] on 04/30/2021

## 2021-04-06 ENCOUNTER — Other Ambulatory Visit: Payer: Self-pay

## 2021-04-06 ENCOUNTER — Ambulatory Visit
Admission: RE | Admit: 2021-04-06 | Discharge: 2021-04-06 | Disposition: A | Discharge status: Home / Self Care | Payer: Medicare HMO | Source: Ambulatory Visit | Attending: Radiation Oncology | Admitting: Radiation Oncology

## 2021-04-06 VITALS — BP 142/80 | HR 82 | Temp 97.7°F | Resp 18 | Ht 72.0 in | Wt 168.1 lb

## 2021-04-06 DIAGNOSIS — Z510 Encounter for antineoplastic radiation therapy: Secondary | ICD-10-CM | POA: Insufficient documentation

## 2021-04-06 DIAGNOSIS — C3490 Malignant neoplasm of unspecified part of unspecified bronchus or lung: Secondary | ICD-10-CM | POA: Diagnosis not present

## 2021-04-06 DIAGNOSIS — C7931 Secondary malignant neoplasm of brain: Secondary | ICD-10-CM | POA: Diagnosis present

## 2021-04-06 MED ORDER — VITAMIN E 180 MG (400 UNIT) PO CAPS: ORAL_CAPSULE | ORAL | 5 refills | Status: DC

## 2021-04-06 MED ORDER — LORAZEPAM 1 MG PO TABS: ORAL_TABLET | ORAL | 0 refills | Status: DC

## 2021-04-06 MED ORDER — PENTOXIFYLLINE ER 400 MG PO TBCR: EXTENDED_RELEASE_TABLET | ORAL | 5 refills | Status: DC

## 2021-04-07 ENCOUNTER — Other Ambulatory Visit: Payer: Self-pay | Admitting: Radiation Therapy

## 2021-04-07 ENCOUNTER — Ambulatory Visit
Admission: RE | Admit: 2021-04-07 | Discharge: 2021-04-07 | Disposition: A | Discharge status: Home / Self Care | Payer: Medicare HMO | Source: Ambulatory Visit | Attending: Radiation Oncology | Admitting: Radiation Oncology

## 2021-04-07 ENCOUNTER — Encounter: Payer: Self-pay | Admitting: Radiation Oncology

## 2021-04-07 VITALS — BP 146/83 | HR 78 | Temp 97.8°F | Resp 20 | Ht 72.0 in | Wt 168.8 lb

## 2021-04-07 DIAGNOSIS — C7931 Secondary malignant neoplasm of brain: Secondary | ICD-10-CM

## 2021-04-07 DIAGNOSIS — Z510 Encounter for antineoplastic radiation therapy: Secondary | ICD-10-CM | POA: Diagnosis not present

## 2021-04-07 MED ORDER — SODIUM CHLORIDE 0.9% FLUSH
10.0000 mL | Freq: Once | INTRAVENOUS | Status: AC
  Administered 2021-04-07: 10 mL via INTRAVENOUS

## 2021-04-07 NOTE — Progress Notes (Signed)
Radiation Oncology         [phone] ________________________________  Name: Daequan Kozma MRN: [id number]  Date: 04/06/2021  DOB: 24-Oct-1960  Follow-Up Visit Note  Outpatient  CC: Aleen Campi, NP  [name], MD  Diagnosis and Prior Radiotherapy:    ICD-10-CM   1. Brain metastases (HCC)  C79.31 vitamin E 180 MG (400 UNITS) capsule    pentoxifylline (TRENTAL) 400 MG CR tablet    2. 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   Mr. Pinn presents today for follow-up since completing GK-SRS on on 12/25/2020 at [facility], and to review recent Brain MRI scan - When I saw him last month, we decided to follow him closely due to some nonspecific changes in the previously treated lesions of the left frontal  (2 adjacent brain mets) and left occipital lobe (surgical cavity).  Dose of Decadron, if applicable: N/A--not currently prescribed  Recent neurologic symptoms, if any:  Seizures: None Headaches: Patient denies Nausea: Patient denies Dizziness/ataxia: Patient denies Difficulty with hand coordination: Patient denies Focal numbness/weakness: Patient denies Visual deficits/changes: Patient denies Confusion/Memory deficits: Patient denies any new or worsening symptoms  Additional Complaints / other details: Main concern is acute dermatitis. Reports constant itching/discomfort to his underarms, groin, and back. States he noticed it right before Christmas time and has reached out to his PCP twice help resolve. Has F/U with medical oncologist Dr. [name] on 04/30/2021  I personally review his MRIs as well as all of his records provided by [facility] team. See dosimetry record highlights below.      ALLERGIES:  is allergic to gabapentin, pregabalin, bupropion, duloxetine, ezetimibe, levetiracetam,  other, prochlorperazine, and lamotrigine.  Meds: Current Outpatient Medications  Medication Sig Dispense Refill   pentoxifylline (TRENTAL) 400 MG CR tablet Take 449m tab daily x 1 week, then 4029mBID; take with food 60 tablet 5   vitamin E 180 MG (400 UNITS) capsule Take 400 IU daily x 1 week, then 400 IU BID 60 capsule 5   ACCU-CHEK GUIDE test strip CHECK BLOOD SUGAR DAILY     acetaminophen (TYLENOL) 325 MG tablet Take by mouth.     albuterol (PROVENTIL) (2.5 MG/3ML) 0.083% nebulizer solution      albuterol (VENTOLIN HFA) 108 (90 Base) MCG/ACT inhaler      aspirin EC 81 MG tablet Take 81 mg by mouth daily.     Bempedoic Acid (NEXLETOL) 180 MG TABS Take 1 tablet by mouth daily.     Blood Glucose Monitoring Suppl (ACCU-CHEK GUIDE) w/Device KIT USE AS DIRECTED TO CHECK BLOOD SUGAR     Blood Glucose Monitoring Suppl (GLUCOCOM BLOOD GLUCOSE MONITOR) DEVI Use to check blood sugar daily.     budesonide-formoterol (SYMBICORT) 160-4.5 MCG/ACT inhaler Inhale 2 puffs into the lungs 2 (two) times daily.     citalopram (CELEXA) 10 MG tablet Take 1 tablet (10 mg total) by mouth daily. (Patient not taking: Reported on 03/09/2021) 30 tablet 3   cyclobenzaprine (FLEXERIL) 5 MG tablet Take 5 mg by mouth 3 (three) times daily as needed for muscle spasms.     dronabinol (MARINOL) 5 MG capsule Take 5 mg by mouth 4 (four) times daily. (Patient not taking: Reported on 03/09/2021)     escitalopram (LEXAPRO) 5 MG tablet Take 5 mg  by mouth daily.     fluocinolone (VANOS) 0.01 % cream Apply topically.     folic acid (FOLVITE) 1 MG tablet Take 1 mg by mouth daily. (Patient not taking: Reported on 03/09/2021)     furosemide (LASIX) 20 MG tablet Take 40 mg by mouth daily. (Patient not taking: Reported on 03/09/2021)     glucose blood (PRECISION QID TEST) test strip Use to check blood sugar daily.     Lancets MISC Use to check blood sugar daily.     LORazepam (ATIVAN) 1 MG tablet Take 1 tablet 60 min prior to PET or  MRI, and one more tablet 30 minutes prior to imaging if needed for anxiety. (Take 1- 2 tablets PRN anxiety prior to imaging). 8 tablet 0   metFORMIN (GLUCOPHAGE) 500 MG tablet Take by mouth daily.     metoCLOPramide (REGLAN) 10 MG tablet Take 10 mg by mouth 4 (four) times daily. (Patient not taking: Reported on 03/09/2021)     montelukast (SINGULAIR) 10 MG tablet Take 10 mg by mouth at bedtime.     Multiple Vitamin (MULTIVITAMIN) capsule Take 1 capsule by mouth daily.     naloxone (NARCAN) nasal spray 4 mg/0.1 mL      nitroGLYCERIN (NITROSTAT) 0.4 MG SL tablet      oxyCODONE 10 MG TABS Take 0.5 tablets (5 mg total) by mouth every 4 (four) hours as needed for severe pain. (Patient not taking: Reported on 03/09/2021) 90 tablet 0   pravastatin (PRAVACHOL) 80 MG tablet Take 80 mg by mouth daily.     promethazine (PHENERGAN) 25 MG suppository Place 1 suppository (25 mg total) rectally every 6 (six) hours as needed for nausea or vomiting. (Patient not taking: Reported on 01/27/2021) 12 each 0   SENNA S 8.6-50 MG tablet Take 2 tablets by mouth at bedtime. (Patient not taking: Reported on 03/09/2021)     No current facility-administered medications for this encounter.    Physical Findings: The patient is in no acute distress. Patient is alert and oriented.  height is 6' (1.829 m) and weight is 168 lb 2 oz (76.3 kg). His temporal temperature is 97.7 F (36.5 C). His blood pressure is 142/80 (abnormal) and his pulse is 82. His respiration is 18 and oxygen saturation is 100%. .    Neuro: non focal MSK: ambulatory  KPS = 90  100 - Normal; no complaints; no evidence of disease. 90   - Able to carry on normal activity; minor signs or symptoms of disease. 80   - Normal activity with effort; some signs or symptoms of disease. 36   - Cares for self; unable to carry on normal activity or to do active work. 60   - Requires occasional assistance, but is able to care for most of his personal needs. 50   -  Requires considerable assistance and frequent medical care. 81   - Disabled; requires special care and assistance. 89   - Severely disabled; hospital admission is indicated although death not imminent. 60   - Very sick; hospital admission necessary; active supportive treatment necessary. 10   - Moribund; fatal processes progressing rapidly. 0     - Dead  Karnofsky DA, Abelmann Harrell, Craver LS and [facility] [phone]) The use of the nitrogen mustards in the palliative treatment of carcinoma: with particular reference to bronchogenic carcinoma Cancer 1 634-56   Lab Findings: Lab Results  Component Value Date   WBC 4.7 03/09/2021   HGB 11.2 (L) 03/09/2021  HCT 35.1 (L) 03/09/2021   MCV 90.0 03/09/2021   PLT 408 (H) 03/09/2021    Radiographic Findings: MR Brain W Wo Contrast  Result Date: 04/01/2021 CLINICAL DATA:  Brain/CNS neoplasm. Assess treatment response. SRS protocol. Metastatic non-small cell lung cancer. EXAM: MRI HEAD WITHOUT AND WITH CONTRAST TECHNIQUE: Multiplanar, multiecho pulse sequences of the brain and surrounding structures were obtained without and with intravenous contrast. CONTRAST:  73m MULTIHANCE GADOBENATE DIMEGLUMINE 529 MG/ML IV SOLN COMPARISON:  02/20/2021. 07/23/2020. Outside studies from August and September of 2022. FINDINGS: Brain: Punctate focus of enhancement in the cerebellar vermis on the study of 02/20/2021 has enlarged, now measuring 14 mm in diameter. Considerable vasogenic edema within the cerebellar vermis. No fourth ventricular compromise. Previous left occipital craniotomy and tumor resection. Small focus of enhancement at the resection site I think is indeterminate for postoperative change versus recurrent tumor. I do not think that recurrent tumor is excluded at this point, there being a somewhat concerning 5 mm nodular focus of enhancement. Further enlargement of a centrally necrotic enhancing lesion in the left frontal lobe, increased from 6 mm to 12  mm. Small adjacent lobulation or satellite focus adjacent to that measuring about 3 mm. Whereas this could represent pseudoprogression/treatment effect, I am or concern for actual disease progression. No abnormality newly seen elsewhere. Generalized atrophy with chronic small-vessel ischemic changes of the white matter, unchanged since previous. No hydrocephalus. Vascular: Major vessels at the base of the brain show flow. Skull and upper cervical spine: Negative Sinuses/Orbits: Clear sinuses. Retention cyst of the left maxillary sinus floor. Orbits negative. 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 Considerable regional edema. Somewhat concerning appearance at the site of left occipital resection. Small nodular focus enhance mint measuring up to 5 mm could possibly be postoperative, but I think could also be some residual/recurrent tumor. Close follow-up suggested. Enlargement of the centrally necrotic enhancing lesion in the left frontal lobe, increased from 6-12 mm in size. Small medial nodule or adjacent satellite focus. Whereas this could possibly represent pseudoprogression, true progression is the primary concern. Electronically Signed   By: MNelson ChimesM.D.   On: 04/01/2021 16:24   NM PET Image Restag (PS) Skull Base To Thigh  Result Date: 03/13/2021 CLINICAL DATA:  Subsequent treatment strategy for non-small cell lung cancer. EXAM: NUCLEAR MEDICINE PET SKULL BASE TO THIGH TECHNIQUE: 9.1 mCi F-18 FDG was injected intravenously. Full-ring PET imaging was performed from the skull base to thigh after the radiotracer. CT data was obtained and used for attenuation correction and anatomic localization. Fasting blood glucose: 123 mg/dl COMPARISON:  [facility] PET-CT 01/25/2021. FINDINGS: Mediastinal blood pool activity: SUV max 2.3 Liver activity: SUV max NA NECK: No hypermetabolic lymph nodes in the neck. Incidental CT findings: none CHEST: No  hypermetabolic mediastinal or hilar nodes. No suspicious pulmonary nodules on the CT scan. Small focus of wedge-shaped soft tissue attenuation at the extreme left lung base may reflect postsurgical scarring or atelectasis. This shows low level FDG uptake ( SUV max = 2.5) stable compared to SUV max = 2.8 previously. Several tiny right lung nodules are evident, measuring in the 3-4 mm size range and visible on images 94, 103 (2 in the right lower lobe on this image), and 106 ( in the right middle lobe). Incidental CT findings: Heart size normal. Coronary artery calcification is evident. Mild atherosclerotic calcification is noted in the wall of the thoracic aorta. Centrilobular emphsyema noted. Volume  loss left hemithorax compatible with prior left upper lobectomy. ABDOMEN/PELVIS: No abnormal hypermetabolic activity within the liver, pancreas, adrenal glands, or spleen. No hypermetabolic lymph nodes in the abdomen or pelvis. Incidental CT findings: There is moderate atherosclerotic calcification of the abdominal aorta without aneurysm. Left colonic diverticulosis without diverticulitis. SKELETON: No focal hypermetabolic activity to suggest skeletal metastasis. Incidental CT findings: No acute bony abnormality. IMPRESSION: 1. No findings to suggest local recurrence. No evidence for hypermetabolic metastatic disease in the neck, chest, abdomen, or pelvis. 2. Stable low level FDG accumulation in a wedge-shaped peripheral focus of atelectasis or scar at the left base. 3. Stable tiny right lung nodules, too small to characterize by PET imaging. While likely benign, attention on follow-up recommended. 4.  Emphysema. (ICD10-J43.9) 5.  Aortic Atherosclerois (ICD10-170.0) Electronically Signed   By: Misty Stanley M.D.   On: 03/13/2021 12:29    Impression/Plan:  I personally review his Brain MRI with patient and wife.  He is asymptomatic from his brain disease.  Lesions are more conspicuous at previous [place] sites. I will  start him on Trental and Vit E in case these are reactive and we will discuss his case at CNS tumor board next week.  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  I recommend SRS to this new site. We can perform RT planning tomorrow, and give SRS next week. I had a lengthy discussion with the patient after reviewing their MRI results with them.  We spoke about fatigue, HA, dizziness, seizure, brain injury and /or radionecrosis that can result from stereotactic radiosurgery. No guarantees of treatment provided.  After lengthy discussion, the patient would like to proceed with stereotactic brain radiosurgery to their metastatic disease. They will meet with neurosurgery in the near future to discuss this further; a neurosurgeon will participate in their case. Consent form was signed today.  CT simulation will take place on 04/07/20.  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  Defer to PCP re: skin dermatitis. Pt declines dermatology referral.    On date of service, in total, I spent 35 minutes on this encounter. Patient was seen in person.  _____________________________________   Eppie Gibson, MD

## 2021-04-07 NOTE — Progress Notes (Signed)
Has armband been applied?  Yes.    Does patient have an allergy to IV contrast dye?: No.   Has patient ever received premedication for IV contrast dye?: No.   Does patient take metformin?: No.  Date of lab work: March 09, 2021 BUN: 9 CR: 0.81 eGFR: >60  IV site: antecubital right, condition patent and no redness  Has IV site been added to flowsheet?  Yes.    BP (!) 146/83 (BP Location: Left Arm, Patient Position: Sitting, Cuff Size: Normal)    Pulse 78    Temp 97.8 F (36.6 C)    Resp 20    Ht 6' (1.829 m)    Wt 168 lb 12.8 oz (76.6 kg)    SpO2 100%    BMI 22.89 kg/m

## 2021-04-08 DIAGNOSIS — Z510 Encounter for antineoplastic radiation therapy: Secondary | ICD-10-CM | POA: Diagnosis not present

## 2021-04-09 DIAGNOSIS — Z510 Encounter for antineoplastic radiation therapy: Secondary | ICD-10-CM | POA: Diagnosis not present

## 2021-04-12 ENCOUNTER — Inpatient Hospital Stay: Payer: Medicare HMO

## 2021-04-12 ENCOUNTER — Other Ambulatory Visit: Payer: Self-pay

## 2021-04-12 DIAGNOSIS — C7931 Secondary malignant neoplasm of brain: Secondary | ICD-10-CM

## 2021-04-13 ENCOUNTER — Other Ambulatory Visit: Payer: Self-pay

## 2021-04-13 ENCOUNTER — Encounter: Payer: Self-pay | Admitting: Radiation Oncology

## 2021-04-13 ENCOUNTER — Ambulatory Visit
Admission: RE | Admit: 2021-04-13 | Discharge: 2021-04-13 | Disposition: A | Discharge status: Home / Self Care | Payer: Medicare HMO | Source: Ambulatory Visit | Attending: Radiation Oncology | Admitting: Radiation Oncology

## 2021-04-13 DIAGNOSIS — Z510 Encounter for antineoplastic radiation therapy: Secondary | ICD-10-CM | POA: Diagnosis not present

## 2021-04-13 NOTE — Progress Notes (Signed)
Nurse monitoring complete status post 1 of 1 SRS treatments. Patient without complaints. Patient denies new or worsening neurologic symptoms. Vitals stable. Instructed patient to avoid strenuous activity for the next 24 hours. Instructed patient to call [phone] with needs related to treatment after hours or over the weekend. Patient verbalized understanding Patient ambulated out of clinic unassisted to wife's vehicle without incident   Vitals:   04/13/21 1531  BP: 127/78  Pulse: 74  Resp: 18  Temp: (!) 96.7 F (35.9 C)  SpO2: 100%

## 2021-04-19 ENCOUNTER — Ambulatory Visit: Payer: Medicare HMO | Admitting: Hematology & Oncology

## 2021-04-19 ENCOUNTER — Other Ambulatory Visit: Payer: Medicare HMO

## 2021-04-21 NOTE — Progress Notes (Signed)
° °                                                                                                                                                          °  Patient Name: Bill Garza MRN: [id number] DOB: 12/08/1960 Referring Physician: [name] (Profile Not Attached) Date of Service: 04/13/2021 [facility], [place]                                                        End Of Treatment Note  Diagnoses: C79.31-Secondary malignant neoplasm of brain  Cancer Staging:  Cancer Staging  Non-small cell lung cancer metastatic to brain [facility]) Staging form: Lung, AJCC 8th Edition - Clinical stage from 01/27/2021: Stage IV (cT3, cN0, cM1) - Signed by [name], MD on 01/27/2021 Stage prefix: Initial diagnosis Histologic grade (G): G2 Histologic grading system: 4 grade system  Intent: Palliative  Radiation Treatment Dates: 04/13/2021 through 04/13/2021 Site Technique Total Dose (Gy) Dose per Fx (Gy) Completed Fx Beam Energies  Brain: Brain_SRS 3D 20/20 20 1/1 [id number]   The new cerebellar vermis 48mm metastasis was treated.  Narrative: The patient tolerated radiation therapy relatively well.   Plan: The patient will follow-up with radiation oncology in 2 mo with MRI surveillance of brain .  -----------------------------------  Eppie Gibson, MD

## 2021-04-30 ENCOUNTER — Inpatient Hospital Stay: Payer: Medicare HMO | Attending: Hematology & Oncology

## 2021-04-30 ENCOUNTER — Other Ambulatory Visit: Payer: Self-pay

## 2021-04-30 ENCOUNTER — Inpatient Hospital Stay (HOSPITAL_BASED_OUTPATIENT_CLINIC_OR_DEPARTMENT_OTHER): Payer: Medicare HMO | Admitting: Hematology & Oncology

## 2021-04-30 ENCOUNTER — Encounter: Payer: Self-pay | Admitting: Hematology & Oncology

## 2021-04-30 VITALS — BP 133/77 | HR 80 | Temp 98.3°F | Resp 18 | Wt 169.0 lb

## 2021-04-30 DIAGNOSIS — Z7984 Long term (current) use of oral hypoglycemic drugs: Secondary | ICD-10-CM | POA: Insufficient documentation

## 2021-04-30 DIAGNOSIS — C3490 Malignant neoplasm of unspecified part of unspecified bronchus or lung: Secondary | ICD-10-CM | POA: Diagnosis not present

## 2021-04-30 DIAGNOSIS — Z7982 Long term (current) use of aspirin: Secondary | ICD-10-CM | POA: Insufficient documentation

## 2021-04-30 DIAGNOSIS — R21 Rash and other nonspecific skin eruption: Secondary | ICD-10-CM | POA: Insufficient documentation

## 2021-04-30 DIAGNOSIS — C3492 Malignant neoplasm of unspecified part of left bronchus or lung: Secondary | ICD-10-CM | POA: Insufficient documentation

## 2021-04-30 DIAGNOSIS — C7931 Secondary malignant neoplasm of brain: Secondary | ICD-10-CM | POA: Diagnosis present

## 2021-04-30 DIAGNOSIS — Z79899 Other long term (current) drug therapy: Secondary | ICD-10-CM | POA: Diagnosis not present

## 2021-04-30 LAB — CMP (CANCER CENTER ONLY)
ALT: 16 U/L (ref 0–44)
AST: 18 U/L (ref 15–41)
Albumin: 3.9 g/dL (ref 3.5–5.0)
Alkaline Phosphatase: 44 U/L (ref 38–126)
Anion gap: 7 (ref 5–15)
BUN: 12 mg/dL (ref 6–20)
CO2: 26 mmol/L (ref 22–32)
Calcium: 9 mg/dL (ref 8.9–10.3)
Chloride: 105 mmol/L (ref 98–111)
Creatinine: 1.01 mg/dL (ref 0.61–1.24)
GFR, Estimated: >60 mL/min (ref >60)
Glucose, Bld: 148 mg/dL — ABNORMAL HIGH (ref 70–99)
Potassium: 3.9 mmol/L (ref 3.5–5.1)
Sodium: 138 mmol/L (ref 135–145)
Total Bilirubin: 0.3 mg/dL (ref 0.3–1.2)
Total Protein: 6.6 g/dL (ref 6.5–8.1)

## 2021-04-30 LAB — CBC WITH DIFFERENTIAL (CANCER CENTER ONLY)
Abs Immature Granulocytes: 0.02 10*3/uL (ref 0.00–0.07)
Basophils Absolute: 0.1 10*3/uL (ref 0.0–0.1)
Basophils Relative: 1 %
Eosinophils Absolute: 0 10*3/uL (ref 0.0–0.5)
Eosinophils Relative: 1 %
HCT: 37.2 % — ABNORMAL LOW (ref 39.0–52.0)
Hemoglobin: 12.2 g/dL — ABNORMAL LOW (ref 13.0–17.0)
Immature Granulocytes: 0 %
Lymphocytes Relative: 37 %
Lymphs Abs: 2.4 10*3/uL (ref 0.7–4.0)
MCH: 29.1 pg (ref 26.0–34.0)
MCHC: 32.8 g/dL (ref 30.0–36.0)
MCV: 88.8 fL (ref 80.0–100.0)
Monocytes Absolute: 0.4 10*3/uL (ref 0.1–1.0)
Monocytes Relative: 6 %
Neutro Abs: 3.7 10*3/uL (ref 1.7–7.7)
Neutrophils Relative %: 55 %
Platelet Count: 406 10*3/uL — ABNORMAL HIGH (ref 150–400)
RBC: 4.19 MIL/uL — ABNORMAL LOW (ref 4.22–5.81)
RDW: 15.3 % (ref 11.5–15.5)
WBC Count: 6.7 10*3/uL (ref 4.0–10.5)
nRBC: 0 % (ref 0.0–0.2)

## 2021-04-30 LAB — LACTATE DEHYDROGENASE: LDH: 128 U/L (ref 98–192)

## 2021-04-30 NOTE — Progress Notes (Signed)
Hematology and Oncology Follow Up Visit  Bill Garza [id number] 18-Oct-1960 61 y.o. 04/30/2021   Principle Diagnosis:  Metastatic adenocarcinoma of the lung-CNS metastasis  Current Therapy:   Status post radiosurgery-  SBRT given on 04/13/2021     Interim History:  Mr. Bill Garza is back for follow-up.  Unfortunately, he had another SBRT.  He had MRI that was done on 04/01/2021.  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  He had a couple other changes but these were just observed.  The problem that he has right now is that he has rash.  This is a very sparse rash.  However, it is causing a lot of pruritus.  He has had this since December.  There are very small macular areas.  There is no blisters.  Has been to his family doctor.  He has been given different medications and creams.  Nothing is helped.  The rash is typically on his abdomen.  There is some areas on his lower back.  We may have to get him to a dermatologist.  I am unsure if this is some kind of vasculitis that we are dealing with.  He has had no problems with bowels or bladder.  He has had no leg weakness.  He has had no fever.  He has had no cough or shortness of breath.  We did do a PET scan on him.  This was done on 03/12/2021.  There is no evidence of systemic metastasis.  He has had a good appetite.  He has had no nausea or vomiting.  He has had no swollen lymph nodes.  There is no blurred vision.  He has had no speech impediment.  Currently, I would say his performance status is probably ECOG 1.  Medications:  Current Outpatient Medications:    ACCU-CHEK GUIDE test strip, CHECK BLOOD SUGAR DAILY, Disp: , Rfl:    acetaminophen (TYLENOL) 325 MG tablet, Take by mouth., Disp: , Rfl:    albuterol (PROVENTIL) (2.5 MG/3ML) 0.083% nebulizer solution, , Disp: , Rfl:    albuterol (VENTOLIN HFA) 108 (90 Base) MCG/ACT inhaler, , Disp: , Rfl:    aspirin EC 81 MG tablet, Take 81 mg by mouth daily., Disp: , Rfl:     Bempedoic Acid (NEXLETOL) 180 MG TABS, Take 1 tablet by mouth daily., Disp: , Rfl:    Blood Glucose Monitoring Suppl (ACCU-CHEK GUIDE) w/Device KIT, USE AS DIRECTED TO CHECK BLOOD SUGAR, Disp: , Rfl:    Blood Glucose Monitoring Suppl (GLUCOCOM BLOOD GLUCOSE MONITOR) DEVI, Use to check blood sugar daily., Disp: , Rfl:    budesonide-formoterol (SYMBICORT) 160-4.5 MCG/ACT inhaler, Inhale 2 puffs into the lungs 2 (two) times daily., Disp: , Rfl:    citalopram (CELEXA) 10 MG tablet, Take 1 tablet (10 mg total) by mouth daily. (Patient not taking: Reported on 03/09/2021), Disp: 30 tablet, Rfl: 3   cyclobenzaprine (FLEXERIL) 5 MG tablet, Take 5 mg by mouth 3 (three) times daily as needed for muscle spasms., Disp: , Rfl:    dronabinol (MARINOL) 5 MG capsule, Take 5 mg by mouth 4 (four) times daily. (Patient not taking: Reported on 03/09/2021), Disp: , Rfl:    escitalopram (LEXAPRO) 5 MG tablet, Take 5 mg by mouth daily., Disp: , Rfl:    fluocinolone (VANOS) 0.01 % cream, Apply topically., Disp: , Rfl:    folic acid (FOLVITE) 1 MG tablet, Take 1 mg by mouth daily. (Patient not taking: Reported on 03/09/2021), Disp: , Rfl:  furosemide (LASIX) 20 MG tablet, Take 40 mg by mouth daily. (Patient not taking: Reported on 03/09/2021), Disp: , Rfl:    glucose blood (PRECISION QID TEST) test strip, Use to check blood sugar daily., Disp: , Rfl:    Lancets MISC, Use to check blood sugar daily., Disp: , Rfl:    LORazepam (ATIVAN) 1 MG tablet, Take 1 tablet 60 min prior to PET or MRI, and one more tablet 30 minutes prior to imaging if needed for anxiety. (Take 1- 2 tablets PRN anxiety prior to imaging)., Disp: 8 tablet, Rfl: 0   metFORMIN (GLUCOPHAGE) 500 MG tablet, Take by mouth daily., Disp: , Rfl:    metoCLOPramide (REGLAN) 10 MG tablet, Take 10 mg by mouth 4 (four) times daily. (Patient not taking: Reported on 03/09/2021), Disp: , Rfl:    montelukast (SINGULAIR) 10 MG tablet, Take 10 mg by mouth at bedtime., Disp: ,  Rfl:    Multiple Vitamin (MULTIVITAMIN) capsule, Take 1 capsule by mouth daily., Disp: , Rfl:    naloxone (NARCAN) nasal spray 4 mg/0.1 mL, , Disp: , Rfl:    nitroGLYCERIN (NITROSTAT) 0.4 MG SL tablet, , Disp: , Rfl:    oxyCODONE 10 MG TABS, Take 0.5 tablets (5 mg total) by mouth every 4 (four) hours as needed for severe pain. (Patient not taking: Reported on 03/09/2021), Disp: 90 tablet, Rfl: 0   pentoxifylline (TRENTAL) 400 MG CR tablet, Take 472m tab daily x 1 week, then 4060mBID; take with food, Disp: 60 tablet, Rfl: 5   pravastatin (PRAVACHOL) 80 MG tablet, Take 80 mg by mouth daily., Disp: , Rfl:    promethazine (PHENERGAN) 25 MG suppository, Place 1 suppository (25 mg total) rectally every 6 (six) hours as needed for nausea or vomiting. (Patient not taking: Reported on 01/27/2021), Disp: 12 each, Rfl: 0   SENNA S 8.6-50 MG tablet, Take 2 tablets by mouth at bedtime. (Patient not taking: Reported on 03/09/2021), Disp: , Rfl:    vitamin E 180 MG (400 UNITS) capsule, Take 400 IU daily x 1 week, then 400 IU BID, Disp: 60 capsule, Rfl: 5  Allergies:  Allergies  Allergen Reactions   Gabapentin Swelling    Leg swelling    Pregabalin Swelling    Leg swelling    Bupropion Nausea And Vomiting   Duloxetine Other (See Comments)    Excessive sleeping   Ezetimibe Other (See Comments)   Levetiracetam Other (See Comments)    Personality changes   Other Other (See Comments)    Antibiotics / post. C-N-OMVE Prochlorperazine Nausea And Vomiting   Lamotrigine Itching and Rash    Past Medical History, Surgical history, Social history, and Family History were reviewed and updated.  Review of Systems: Review of Systems  Constitutional: Negative.   HENT:  Negative.    Eyes: Negative.   Respiratory: Negative.    Cardiovascular: Negative.   Gastrointestinal: Negative.   Endocrine: Negative.   Genitourinary: Negative.    Musculoskeletal: Negative.   Skin: Negative.   Neurological: Negative.    Hematological: Negative.   Psychiatric/Behavioral:  The patient is nervous/anxious.    Physical Exam:  vitals were not taken for this visit.   Wt Readings from Last 3 Encounters:  04/07/21 168 lb 12.8 oz (76.6 kg)  04/06/21 168 lb 2 oz (76.3 kg)  03/09/21 171 lb (77.6 kg)    Physical Exam Vitals reviewed.  HENT:     Head: Normocephalic and atraumatic.  Eyes:     Pupils: Pupils  are equal, round, and reactive to light.  Cardiovascular:     Rate and Rhythm: Normal rate and regular rhythm.     Heart sounds: Normal heart sounds.  Pulmonary:     Effort: Pulmonary effort is normal.     Breath sounds: Normal breath sounds.  Abdominal:     General: Bowel sounds are normal.     Palpations: Abdomen is soft.  Musculoskeletal:        General: No tenderness or deformity. Normal range of motion.     Cervical back: Normal range of motion.  Lymphadenopathy:     Cervical: No cervical adenopathy.  Skin:    General: Skin is warm and dry.     Findings: No erythema or rash.     Comments: On his skin exam, there are very scattered small erythematous maculopapular lesions.  These are maybe a millimeter in size.  They are pruritic.  There might be a central eschar.  There is no pustules.  Neurological:     Mental Status: He is alert and oriented to person, place, and time.  Psychiatric:        Behavior: Behavior normal.        Thought Content: Thought content normal.        Judgment: Judgment normal.     Lab Results  Component Value Date   WBC 6.7 04/30/2021   HGB 12.2 (L) 04/30/2021   HCT 37.2 (L) 04/30/2021   MCV 88.8 04/30/2021   PLT 406 (H) 04/30/2021     Chemistry      Component Value Date/Time   NA 139 03/09/2021 0845   K 4.1 03/09/2021 0845   CL 106 03/09/2021 0845   CO2 23 03/09/2021 0845   BUN 9 03/09/2021 0845   CREATININE 0.81 03/09/2021 0845      Component Value Date/Time   CALCIUM 9.3 03/09/2021 0845   ALKPHOS 44 03/09/2021 0845   AST 22 03/09/2021 0845   ALT  19 03/09/2021 0845   BILITOT 0.3 03/09/2021 0845      Impression and Plan: Mr. Iqbal is a very nice 61 year old white male.  He has a history of metastatic adenocarcinoma of the lung.  He initially presented with localized-stage IIB -adenocarcinoma of the left lung.  He was placed on clinical trial.  He only had 1 cycle of treatment.  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  He did undergo resection of a lesion back in August 2022.  He then underwent radiosurgery.  He has had another round of radiosurgery.  Not sure that this is helped.  Again, I am not sure why he has this rash.  I am not sure what it is related to.  I do not see any medication that he is taking.  Think he needs to go to a dermatologist.  We will see about making a referral for dermatology.  I told him to try some CBD cream.  I do not see a problem with this.  This might help with the itching.  I would like to see him back in about a month.  He is due for another MRI of the brain probably in late March.    At least, there is no obvious systemic metastatic disease that we have to treat right now.   [name], MD 2/10/20233:10 PM

## 2021-05-03 ENCOUNTER — Other Ambulatory Visit: Payer: Self-pay | Admitting: *Deleted

## 2021-05-03 DIAGNOSIS — R21 Rash and other nonspecific skin eruption: Secondary | ICD-10-CM

## 2021-05-03 NOTE — Progress Notes (Signed)
Dermatology referral made Per Dr Marin Olp request to [facility] in [facility] on [address].  Records and referral and face sheet faxed

## 2021-05-27 ENCOUNTER — Emergency Department (HOSPITAL_COMMUNITY): Payer: Medicare HMO

## 2021-05-27 ENCOUNTER — Other Ambulatory Visit: Payer: Self-pay

## 2021-05-27 ENCOUNTER — Encounter (HOSPITAL_COMMUNITY): Payer: Self-pay | Admitting: Emergency Medicine

## 2021-05-27 ENCOUNTER — Telehealth: Payer: Self-pay | Admitting: *Deleted

## 2021-05-27 ENCOUNTER — Emergency Department (HOSPITAL_COMMUNITY)
Admission: EM | Admit: 2021-05-27 | Discharge: 2021-05-27 | Disposition: A | Discharge status: Home / Self Care | Payer: Medicare HMO | Attending: Emergency Medicine | Admitting: Emergency Medicine

## 2021-05-27 DIAGNOSIS — R109 Unspecified abdominal pain: Secondary | ICD-10-CM

## 2021-05-27 DIAGNOSIS — Z7982 Long term (current) use of aspirin: Secondary | ICD-10-CM | POA: Insufficient documentation

## 2021-05-27 DIAGNOSIS — M545 Low back pain, unspecified: Secondary | ICD-10-CM | POA: Insufficient documentation

## 2021-05-27 DIAGNOSIS — Z85118 Personal history of other malignant neoplasm of bronchus and lung: Secondary | ICD-10-CM | POA: Insufficient documentation

## 2021-05-27 DIAGNOSIS — R519 Headache, unspecified: Secondary | ICD-10-CM | POA: Insufficient documentation

## 2021-05-27 DIAGNOSIS — R209 Unspecified disturbances of skin sensation: Secondary | ICD-10-CM | POA: Diagnosis not present

## 2021-05-27 DIAGNOSIS — R1011 Right upper quadrant pain: Secondary | ICD-10-CM | POA: Insufficient documentation

## 2021-05-27 LAB — URINALYSIS, ROUTINE W REFLEX MICROSCOPIC
Bilirubin Urine: NEGATIVE
Glucose, UA: NEGATIVE mg/dL
Hgb urine dipstick: NEGATIVE
Ketones, ur: NEGATIVE mg/dL
Leukocytes,Ua: NEGATIVE
Nitrite: NEGATIVE
Protein, ur: NEGATIVE mg/dL
Specific Gravity, Urine: 1.003 — ABNORMAL LOW (ref 1.005–1.030)
pH: 7 (ref 5.0–8.0)

## 2021-05-27 LAB — CBC WITH DIFFERENTIAL/PLATELET
Abs Immature Granulocytes: 0.02 10*3/uL (ref 0.00–0.07)
Basophils Absolute: 0.1 10*3/uL (ref 0.0–0.1)
Basophils Relative: 1 %
Eosinophils Absolute: 0.1 10*3/uL (ref 0.0–0.5)
Eosinophils Relative: 1 %
HCT: 40 % (ref 39.0–52.0)
Hemoglobin: 13.4 g/dL (ref 13.0–17.0)
Immature Granulocytes: 0 %
Lymphocytes Relative: 34 %
Lymphs Abs: 2.5 10*3/uL (ref 0.7–4.0)
MCH: 30.3 pg (ref 26.0–34.0)
MCHC: 33.5 g/dL (ref 30.0–36.0)
MCV: 90.5 fL (ref 80.0–100.0)
Monocytes Absolute: 0.5 10*3/uL (ref 0.1–1.0)
Monocytes Relative: 6 %
Neutro Abs: 4.2 10*3/uL (ref 1.7–7.7)
Neutrophils Relative %: 58 %
Platelets: 395 10*3/uL (ref 150–400)
RBC: 4.42 MIL/uL (ref 4.22–5.81)
RDW: 16 % — ABNORMAL HIGH (ref 11.5–15.5)
WBC: 7.4 10*3/uL (ref 4.0–10.5)
nRBC: 0 % (ref 0.0–0.2)

## 2021-05-27 LAB — COMPREHENSIVE METABOLIC PANEL
ALT: 22 U/L (ref 0–44)
AST: 21 U/L (ref 15–41)
Albumin: 4.1 g/dL (ref 3.5–5.0)
Alkaline Phosphatase: 40 U/L (ref 38–126)
Anion gap: 8 (ref 5–15)
BUN: 16 mg/dL (ref 6–20)
CO2: 24 mmol/L (ref 22–32)
Calcium: 9.3 mg/dL (ref 8.9–10.3)
Chloride: 107 mmol/L (ref 98–111)
Creatinine, Ser: 0.96 mg/dL (ref 0.61–1.24)
GFR, Estimated: >60 mL/min (ref >60)
Glucose, Bld: 121 mg/dL — ABNORMAL HIGH (ref 70–99)
Potassium: 4 mmol/L (ref 3.5–5.1)
Sodium: 139 mmol/L (ref 135–145)
Total Bilirubin: 0.2 mg/dL — ABNORMAL LOW (ref 0.3–1.2)
Total Protein: 7.1 g/dL (ref 6.5–8.1)

## 2021-05-27 LAB — LIPASE, BLOOD: Lipase: 167 U/L — ABNORMAL HIGH (ref 11–51)

## 2021-05-27 IMAGING — CT CT ANGIO CHEST-ABD-PELV FOR DISSECTION W/ AND WO/W CM
2 of 7 series · 11 of 46 positions shown, 12 images · non-contrast
Comparison: Abdomen pelvis CT [DATE], PET CT [DATE]

CLINICAL DATA: Acute aortic syndrome (AAS) suspected

Patient reports right flank pain.
EXAM:
CT ANGIOGRAPHY CHEST, ABDOMEN AND PELVIS
TECHNIQUE: Non-contrast CT of the chest was initially obtained. Multidetector
CT imaging through the chest, abdomen and pelvis was performed using
the standard protocol during bolus administration of intravenous
contrast. Multiplanar reconstructed images and MIPs were obtained
and reviewed to evaluate the vascular anatomy.

[Series 4: axial arterial · axial · arterial · 0.81mm/px · z∈[-664,-73]mm · 8 of 229 slices shown, 9 images]
[im 16/229  soft-tissue]
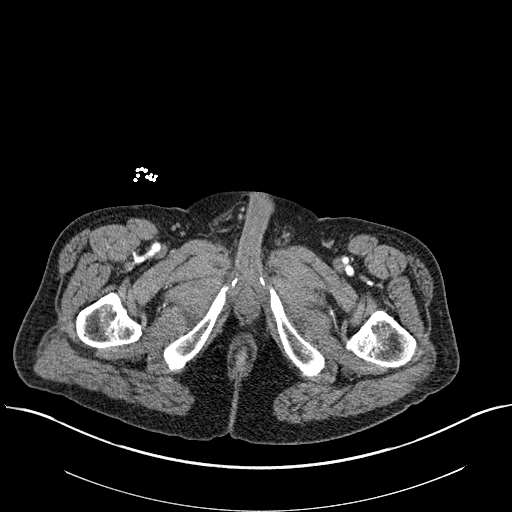
[im 16/229  bone]
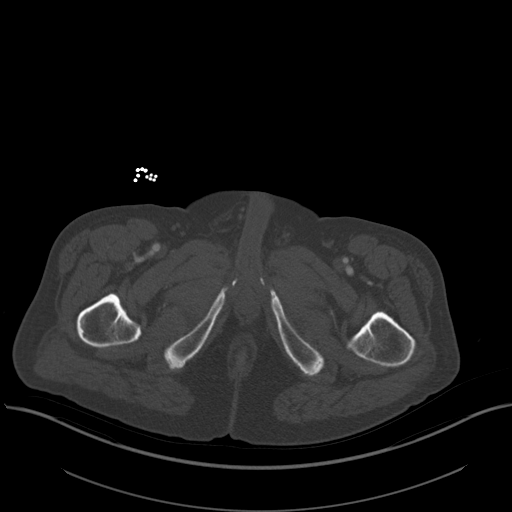
[im 46/229  soft-tissue]
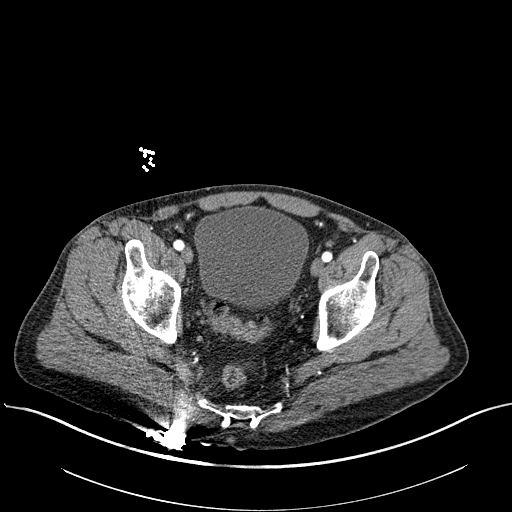
[im 77/229  soft-tissue]
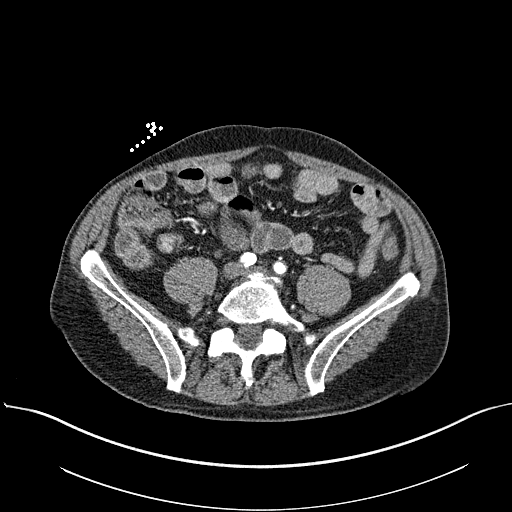
[im 107/229  soft-tissue]
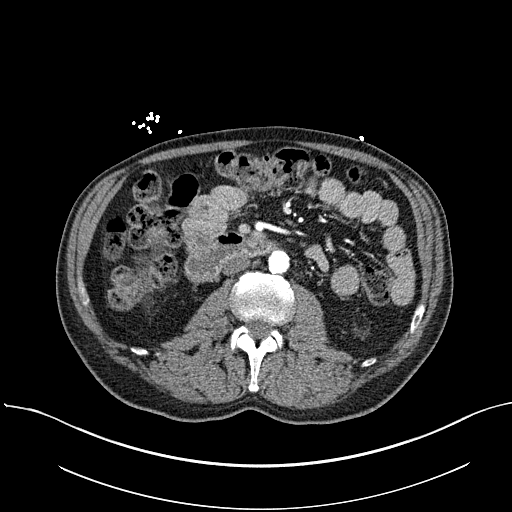
[im 122/229  soft-tissue]
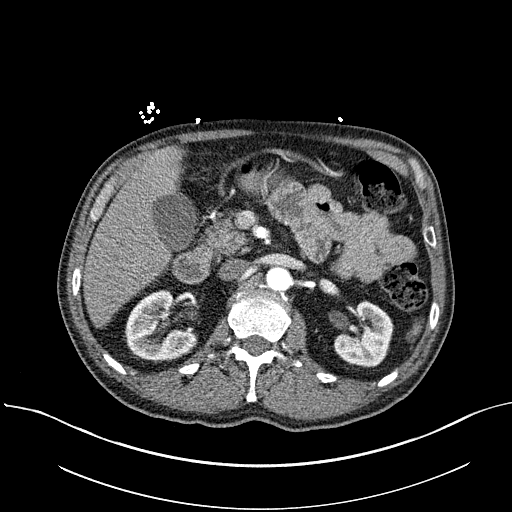
[im 153/229  soft-tissue]
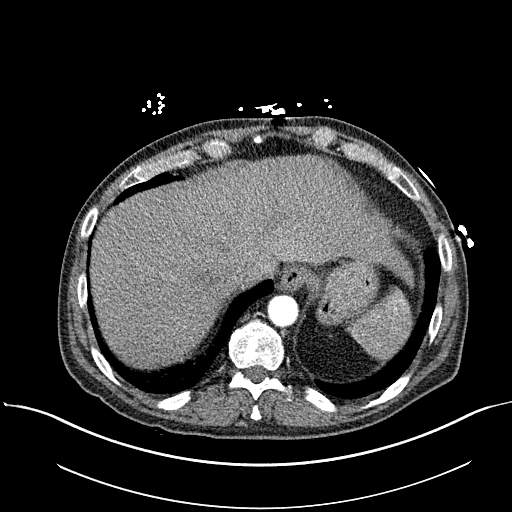
[im 183/229  soft-tissue]
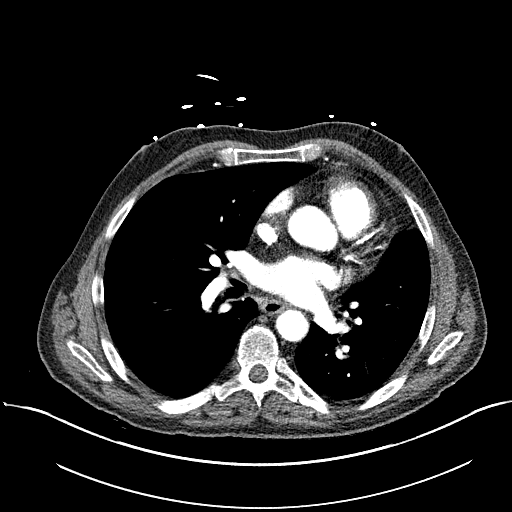
[im 213/229  soft-tissue]
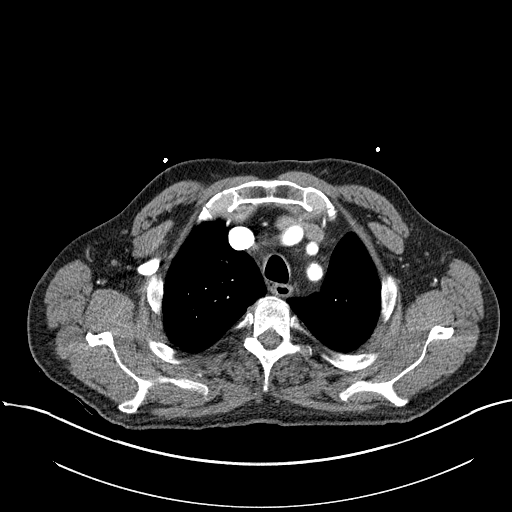

[Series 9: coronals · coronal · 0.66mm/px · 3 of 147 slices shown]
[im 37/147  soft-tissue]
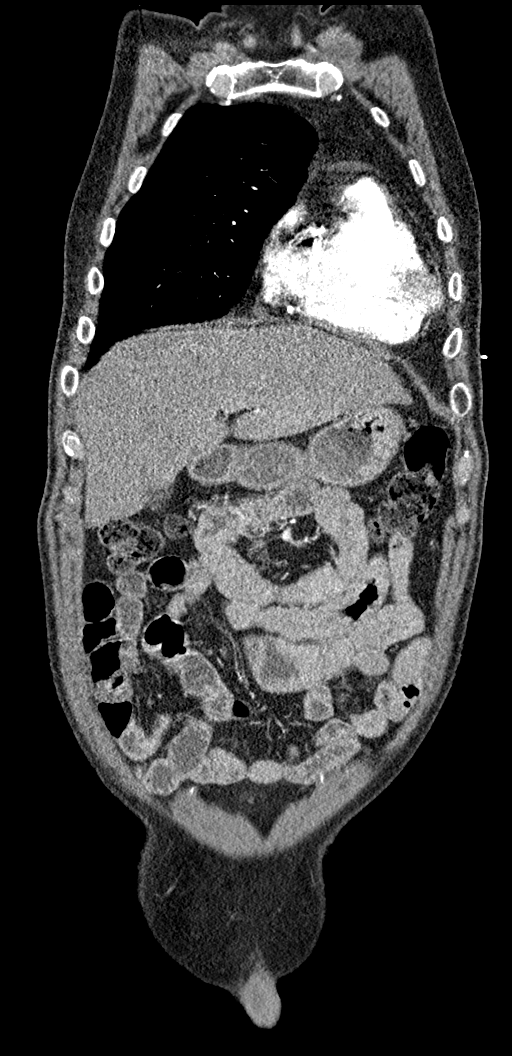
[im 74/147  soft-tissue]
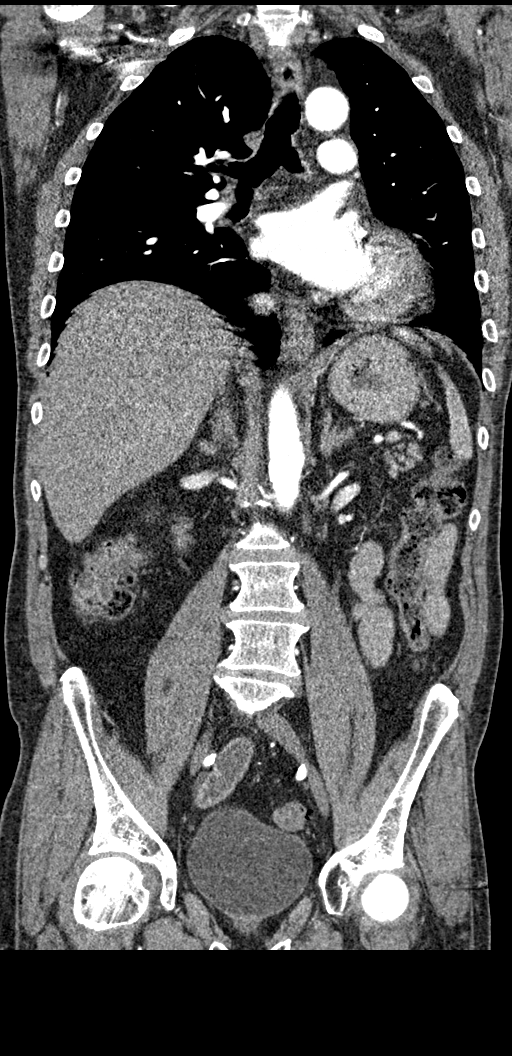
[im 110/147  soft-tissue]
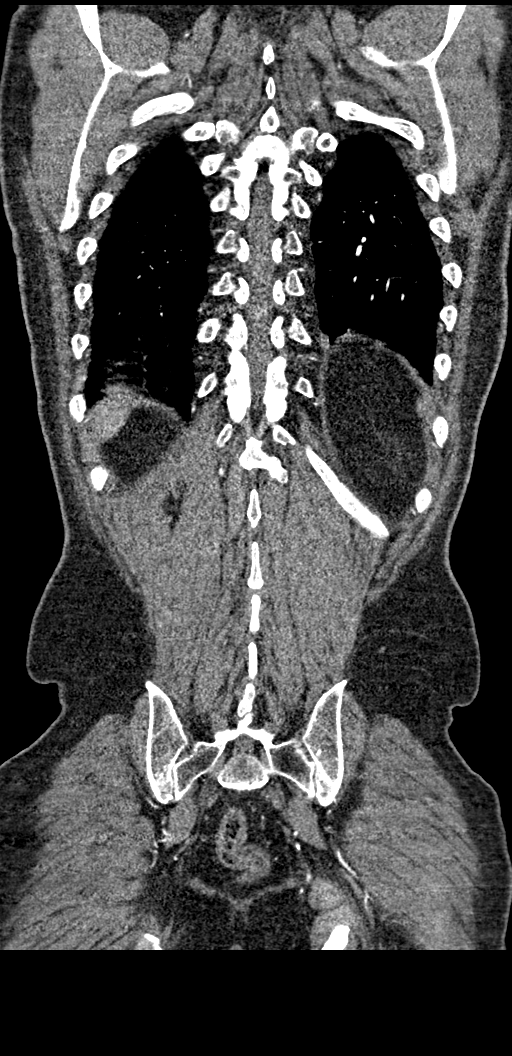

[11 of 46 positions shown; findings below may reference images not displayed]

RADIATION DOSE REDUCTION: This exam was performed according to the
departmental dose-optimization program which includes automated
exposure control, adjustment of the mA and/or kV according to
patient size and/or use of iterative reconstruction technique.

CONTRAST:  100mL OMNIPAQUE IOHEXOL 350 MG/ML SOLN
FINDINGS: CTA CHEST FINDINGS

Cardiovascular: Atherosclerosis of the thoracic aorta. No aortic
hematoma noncontrast exam. No dissection, aneurysm, or evidence of
acute aortic syndrome. Conventional branching pattern from the
aortic arch with patent branch vessels. There is no pulmonary
embolus to the segmental level. The heart is normal in size.
Coronary artery calcifications. No pericardial effusion.

Mediastinum/Nodes: No enlarged mediastinal or hilar lymph nodes.
Patulous esophagus without wall thickening. No visualized thyroid
nodule.

Lungs/Pleura: Patient with history of non-small cell lung cancer.
There is stable volume loss in the left hemithorax. Scarring at the
left lung base is similar to prior. Central bronchial thickening.
Minimal retained mucus in the trachea. Mild emphysema. No confluent
consolidation. No pleural effusion.

Multiple small pulmonary nodules in the right lower and right middle
lobes, also seen on prior exams. These measure from 3-4 mm. Nodules
are seen on series 6 images 79 (2 separate nodules), 80, 81, and 89.

Musculoskeletal: Thoracic spine assessed on concurrent thoracic
spine reformats, reported separately. No fracture or acute osseous
findings of the ribs or sternum.

Review of the MIP images confirms the above findings.

CTA ABDOMEN AND PELVIS FINDINGS

VASCULAR

Aorta: Moderate atherosclerosis. No aneurysm, dissection, vasculitis
or significant stenosis.

Celiac: Mild plaque at the origin. No significant stenosis. No acute
findings. Branch vessels are patent.

SMA: Patent without evidence of aneurysm, dissection, vasculitis or
significant stenosis. Replaced right hepatic artery arises from the
SMA.

Renals: Both renal arteries are patent without evidence of aneurysm,
dissection, vasculitis, fibromuscular dysplasia or significant
stenosis.

IMA: Patent without evidence of aneurysm, dissection, vasculitis or
significant stenosis.

Inflow: External iliac arteries are patent without evidence of
aneurysm, dissection, vasculitis or significant stenosis. The
internal iliac arteries are small in caliber and demonstrate
intermittent stenosis.

Veins: No obvious venous abnormality within the limitations of this
arterial phase study.

Review of the MIP images confirms the above findings.

NON-VASCULAR

Hepatobiliary: No evidence of focal hepatic lesion on this arterial
phase exam. Gallbladder physiologically distended, no calcified
stone. No biliary dilatation.

Pancreas: No ductal dilatation or inflammation.

Spleen: Normal in size and arterial enhancement.

Adrenals/Urinary Tract: No adrenal nodule. Slight prominence of the
right renal pelvis and ureter. No evidence of renal or ureteral
calculi. No perinephric edema. Homogeneous renal enhancement.
Unremarkable urinary bladder.

Stomach/Bowel: Sigmoid colonic diverticulosis without
diverticulitis. Moderate volume of colonic stool. No bowel
obstruction or inflammation. Normal appendix. Tiny hiatal hernia,
otherwise unremarkable stomach.

Lymphatic: No enlarged lymph nodes in the abdomen or pelvis.

Reproductive: Prostate is unremarkable.

Other: Fat in both inguinal canals.  No ascites or free air.

Musculoskeletal: Lumbar spine assessed on concurrent lumbar spine
reformats, reported separately. No acute bony findings the pelvis.

Review of the MIP images confirms the above findings.
IMPRESSION: 1. Aortic atherosclerosis without aneurysm or acute aortic finding.
No pulmonary embolus.
2. Slight prominence of the right renal pelvis and ureter without
obstructing stone. This may be secondary to recently urinary tract
infection. Recommend correlation with urinalysis. No urolithiasis.
3. Volume loss in the left hemithorax, patient with history of lung
cancer. Small nodules in the right lung base measuring 3-4 mm are
similar to prior PET. Attention at follow-up recommended.
4. Colonic diverticulosis without diverticulitis.
5. Moderate colonic stool burden suggesting constipation.

Aortic Atherosclerosis ([LZ]-[LZ]) and Emphysema ([LZ]-[LZ]).

## 2021-05-27 IMAGING — CT CT T SPINE W/O CM
2 of 3 series · 11 of 33 positions shown, 13 images · non-contrast
Comparison: PET CT [DATE]

CLINICAL DATA: Right flank pain.

EXAM:
CT THORACIC SPINE WITHOUT CONTRAST
TECHNIQUE: Multidetector CT images of the thoracic were obtained using the
standard protocol without intravenous contrast.
RADIATION DOSE REDUCTION: This exam was performed according to the
departmental dose-optimization program which includes automated
exposure control, adjustment of the mA and/or kV according to
patient size and/or use of iterative reconstruction technique.

[Series 1: axial tspine · axial · 0.26mm/px · z∈[-325,-51]mm · 8 of 163 slices shown, 10 images]
[im 13/163  soft-tissue]
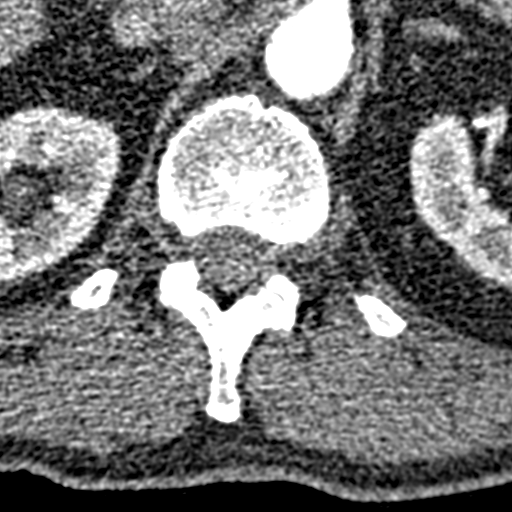
[im 13/163  bone]
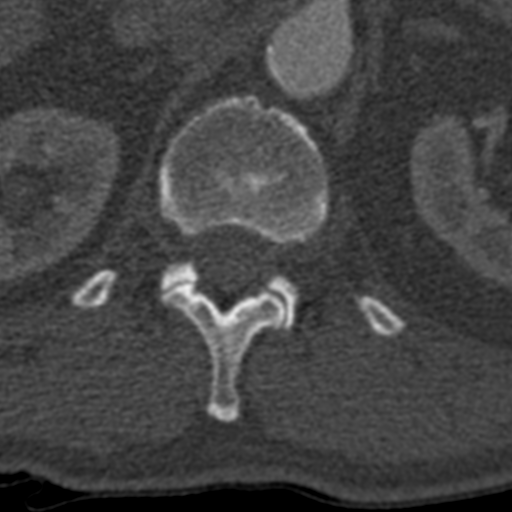
[im 38/163  bone]
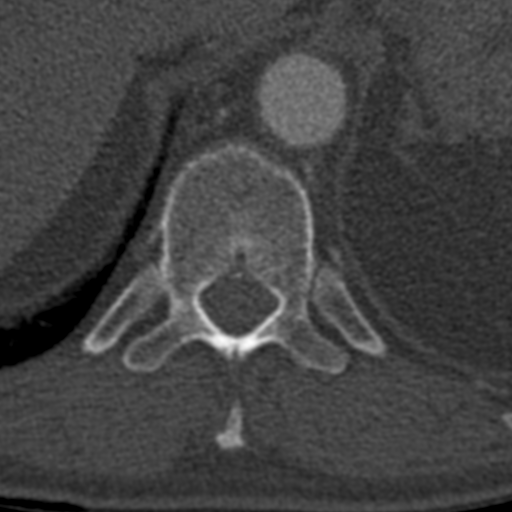
[im 50/163  bone]
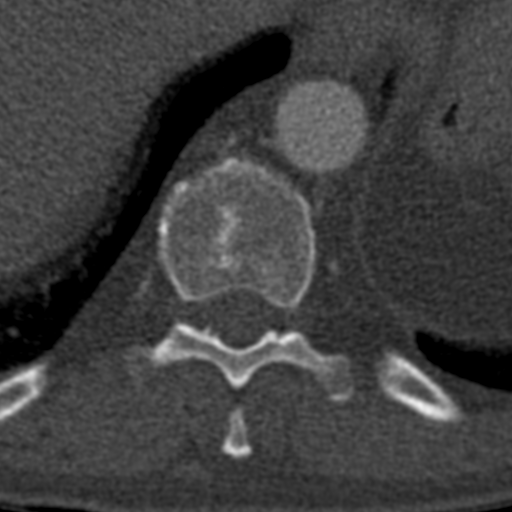
[im 75/163  bone]
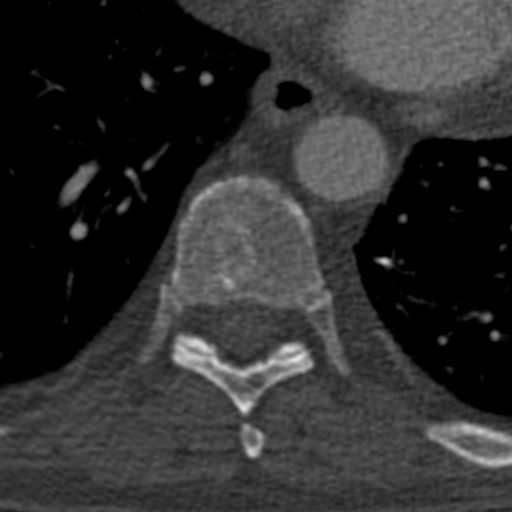
[im 88/163  soft-tissue]
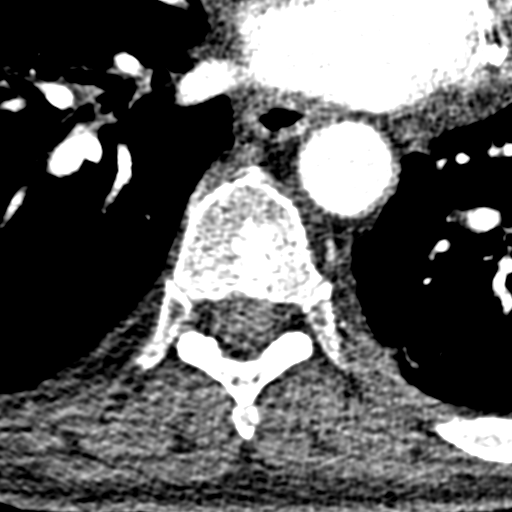
[im 88/163  bone]
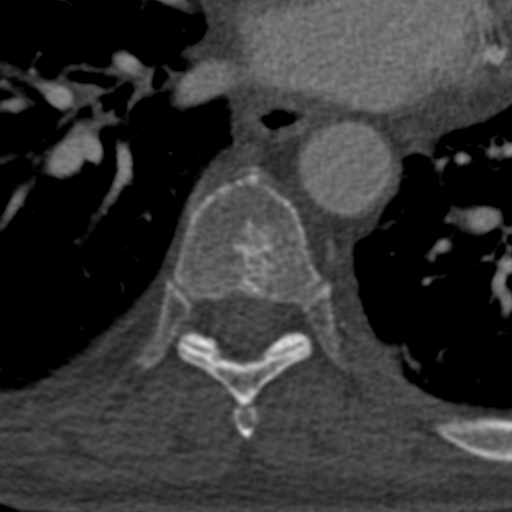
[im 113/163  bone]
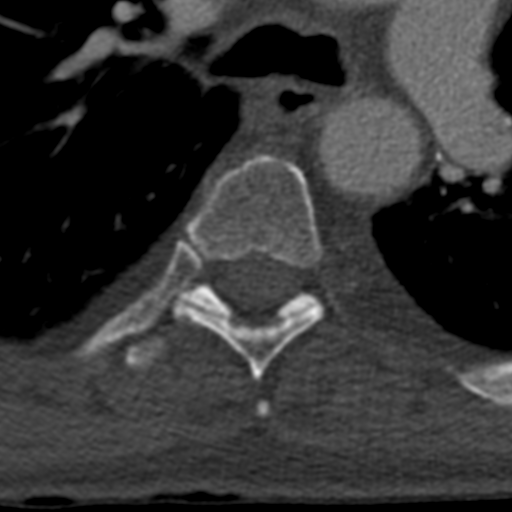
[im 125/163  bone]
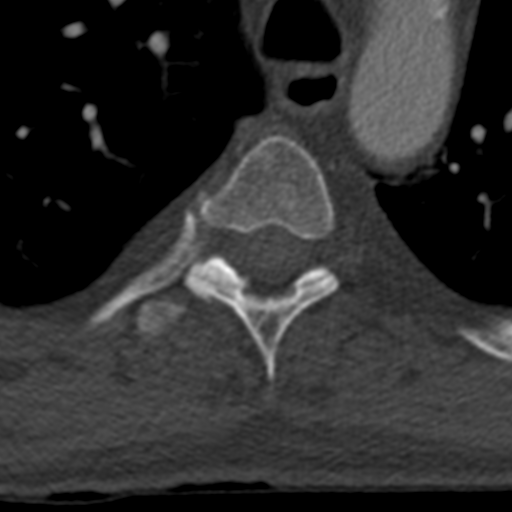
[im 150/163  bone]
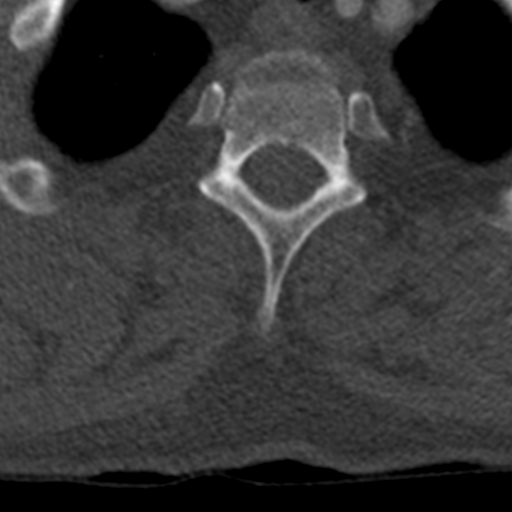

[Series 4: cor tspine bone · coronal · 0.24mm/px · 3 of 77 slices shown]
[im 16/77  bone]
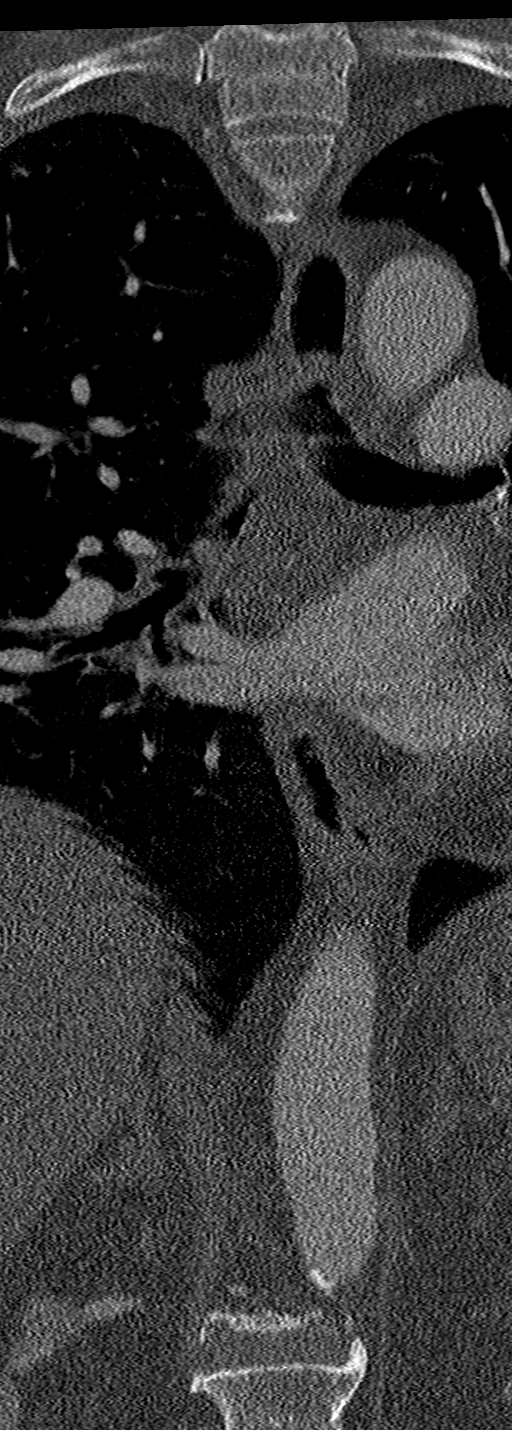
[im 31/77  bone]
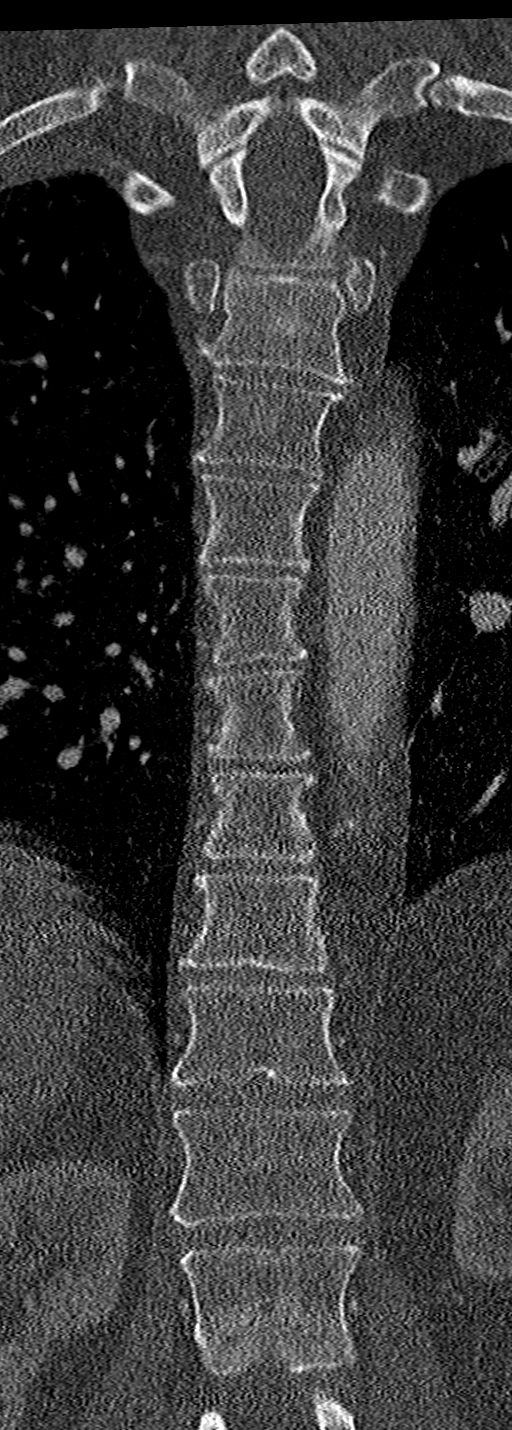
[im 46/77  bone]
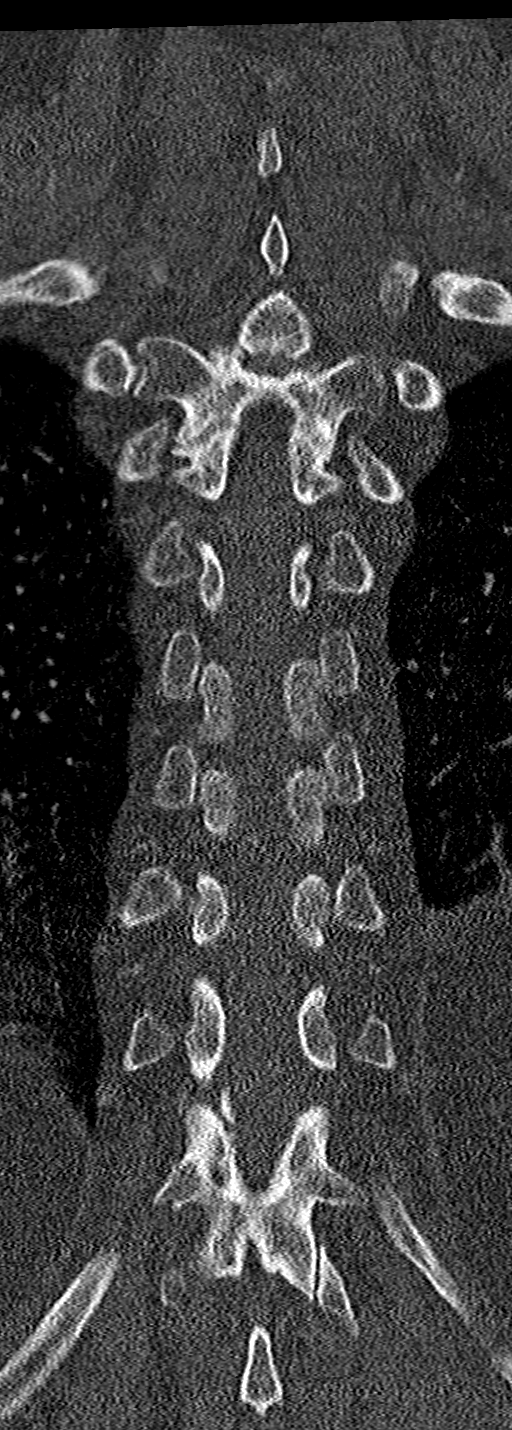

[11 of 33 positions shown; findings below may reference images not displayed]

FINDINGS: Alignment: Normal.

Vertebrae: No evidence of focal lesion or compression fracture. No
focal pathologic process.

Paraspinal and other soft tissues: No paraspinal muscle abnormality.
Full field of view assessment on concurrent chest CTA, reported
separately.

Disc levels: Mild degenerative disc disease with anterior spurring.
No spinal canal stenosis.
IMPRESSION: Mild degenerative disc disease. No acute osseous abnormality of the
thoracic spine.

## 2021-05-27 IMAGING — CT CT HEAD W/O CM
3 series · 15 of 47 positions shown, 18 images · non-contrast
Comparison: MRI brain [DATE]

CLINICAL DATA: Headache. Right flank pain. History of non-small
cell lung cancer metastatic to brain.



[Series 2: head wo · axial · 0.43mm/px · z∈[-134,+6]mm · 9 of 34 slices shown, 12 images]
[im 3/34  brain]
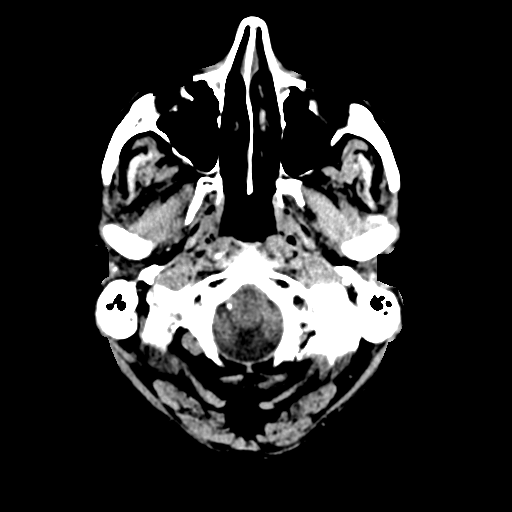
[im 3/34  bone]
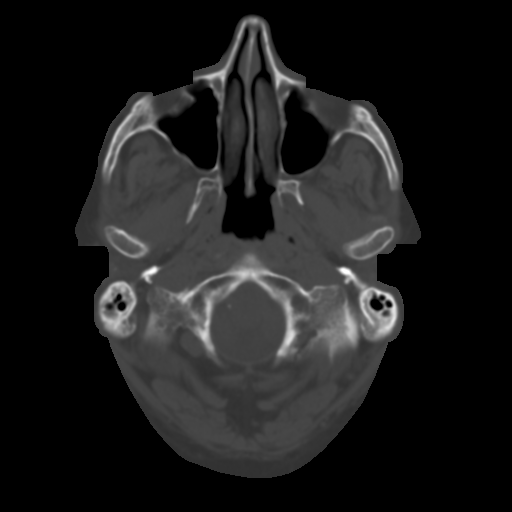
[im 6/34  brain]
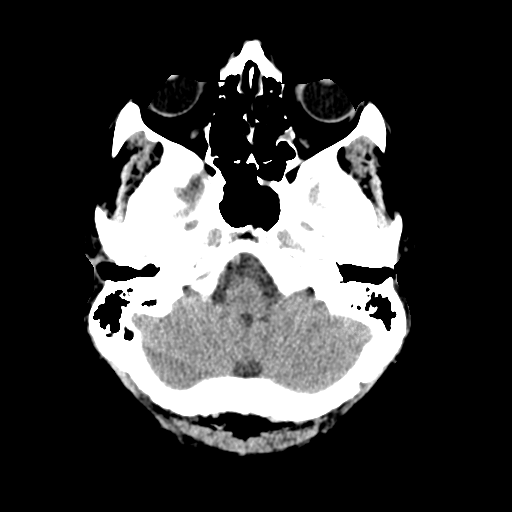
[im 10/34  brain]
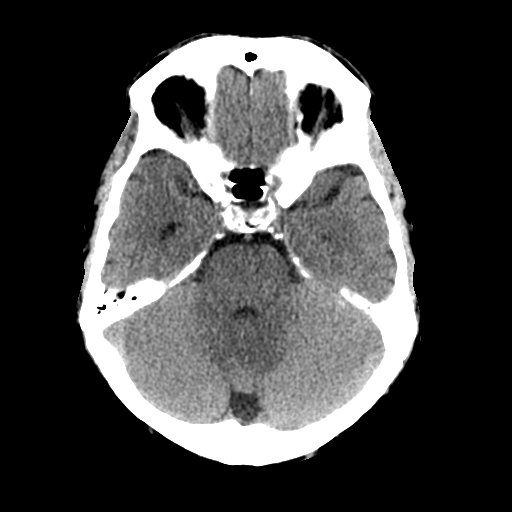
[im 13/34  brain]
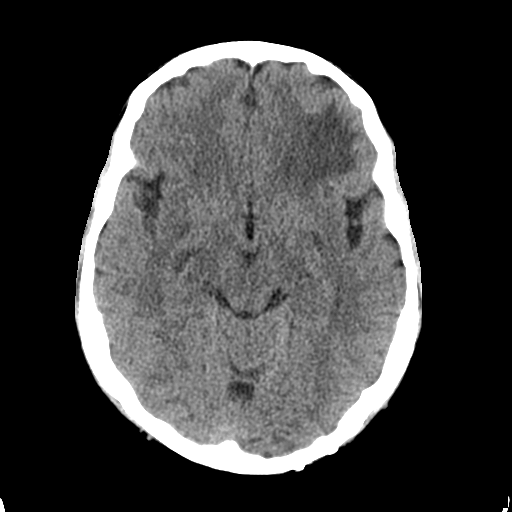
[im 18/34  brain]
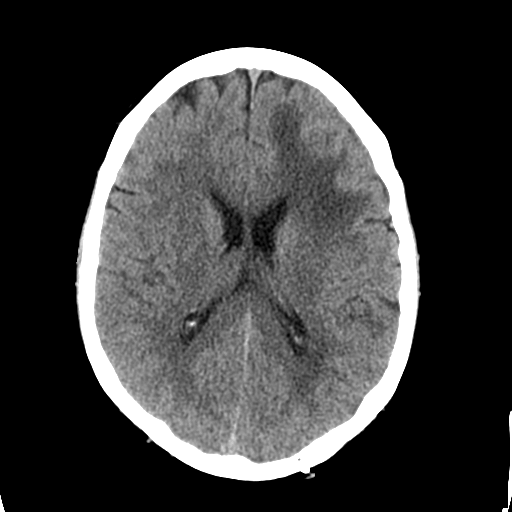
[im 18/34  bone]
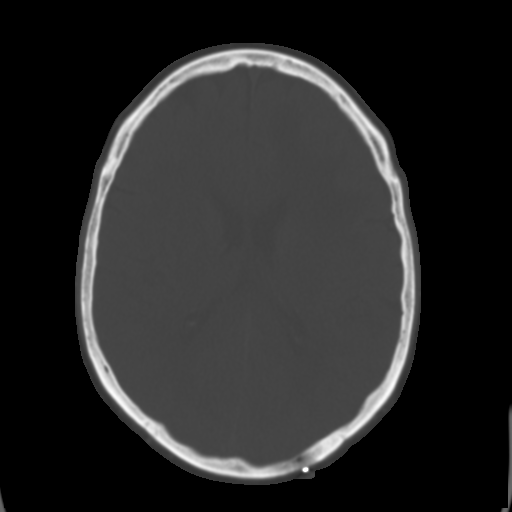
[im 21/34  brain]
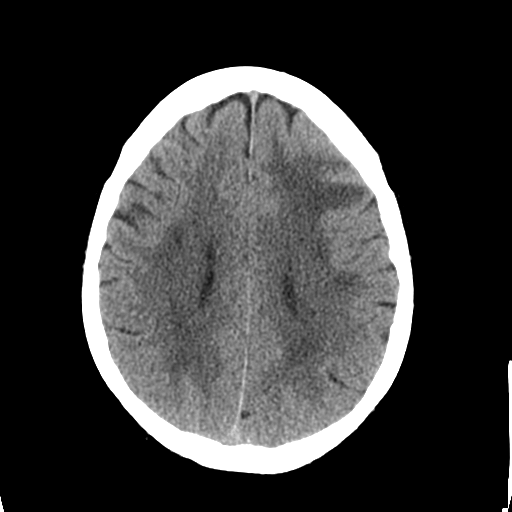
[im 24/34  brain]
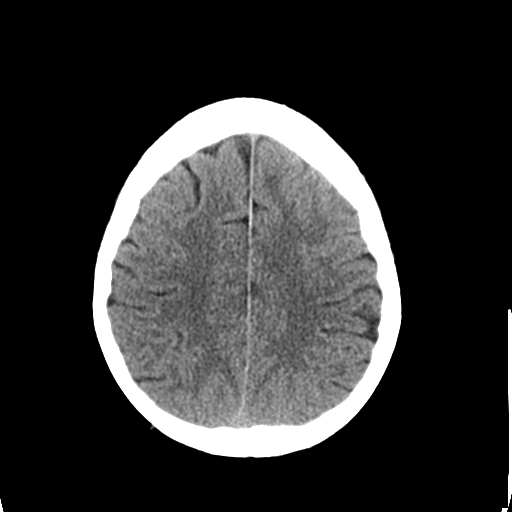
[im 28/34  brain]
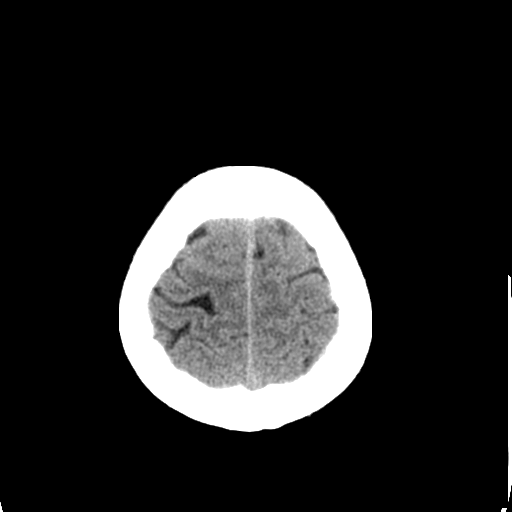
[im 31/34  brain]
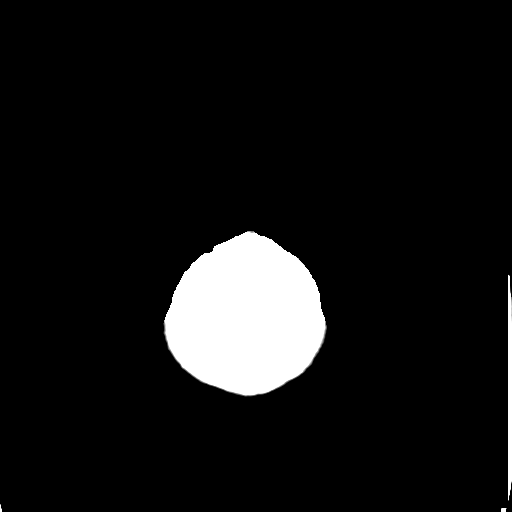
[im 31/34  bone]
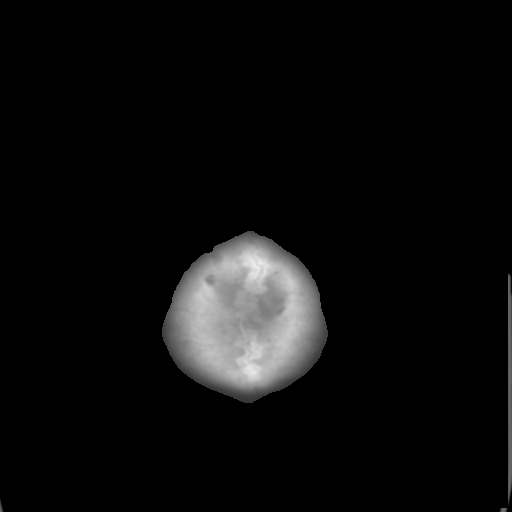

[Series 5: coronal soft tissue · coronal · 0.33mm/px · 3 of 68 slices shown]
[im 23/68  brain]
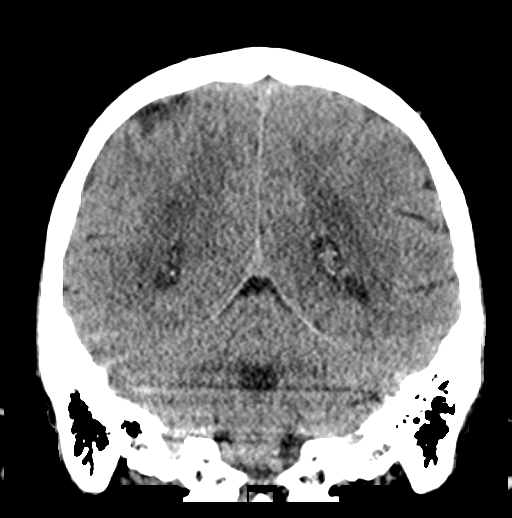
[im 30/68  brain]
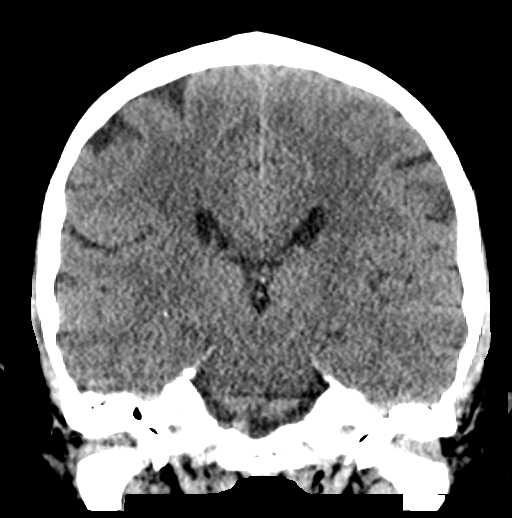
[im 38/68  brain]
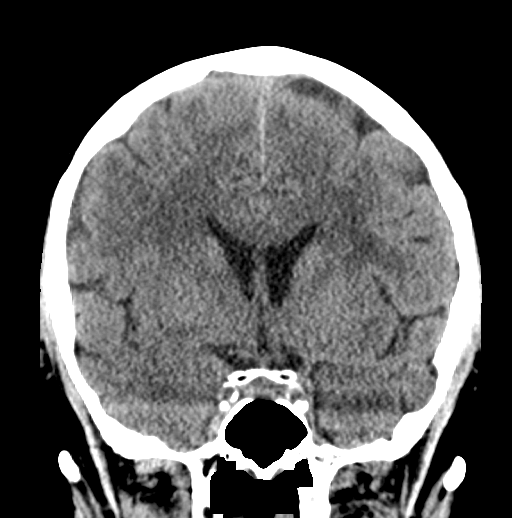

[Series 6: sagittal soft tissue · sagittal · 0.33mm/px · 3 of 57 slices shown]
[im 19/57  brain]
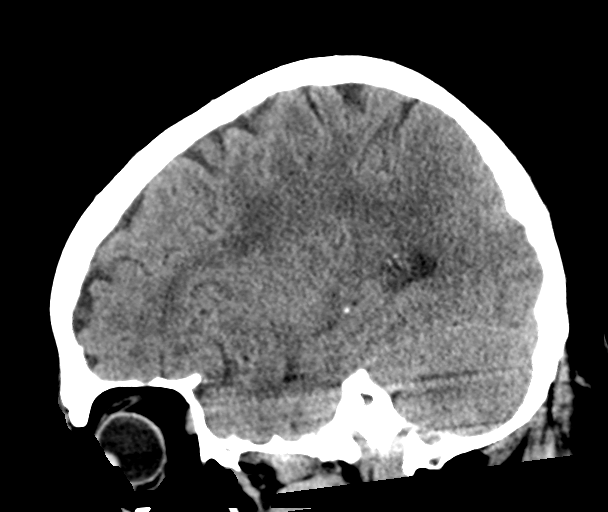
[im 29/57  brain]
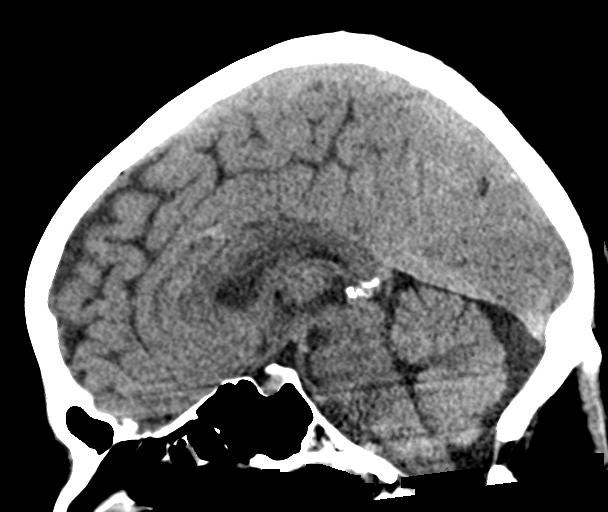
[im 38/57  brain]
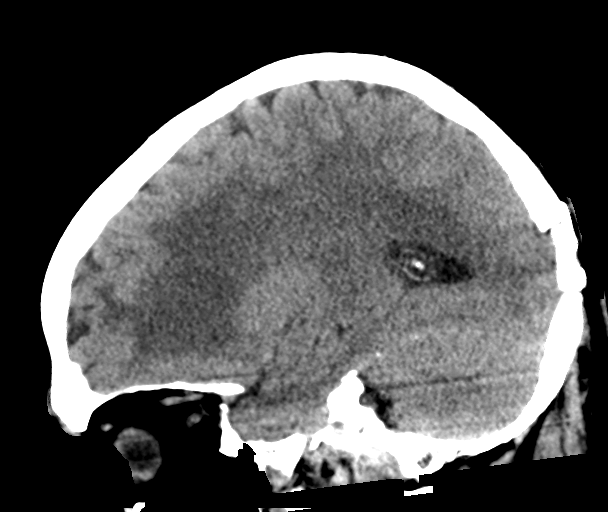

[15 of 47 positions shown; findings below may reference images not displayed]

FINDINGS: Brain: There is relatively low conspicuity of the known metastatic
lesion of the cerebellar vermis possibly due to the lack of IV
contrast on today's examination.

There is abnormal hypodensity in the left frontal lobe white matter
favoring vasogenic edema and possibly associated with the previously
seen enhancing left frontal lobe metastatic lesion shown on the MRI
from [DATE].

Postoperative findings in the left occipital lobe noted with
overlying craniotomy site.

Periventricular white matter and corona radiata hypodensities favor
chronic ischemic microvascular white matter disease. The brainstem
and cerebral peduncles appear unremarkable as do the basal ganglia.
No midline shift or observed intracranial hemorrhage. No acute CVA
is identified.

Vascular: There is atherosclerotic calcification of the cavernous
carotid arteries bilaterally.

Skull: Left occipitoparietal craniotomy.

Sinuses/Orbits: Mild chronic ethmoid sinusitis. Mild chronic left
sphenoid sinusitis. Small left mastoid effusion.

Other: No supplemental non-categorized findings.
IMPRESSION: 1. Potentially increased vasogenic edema in the left frontal lobe
likely associated with the known left frontal lobe metastatic
lesion.
2. The known metastatic lesion in the cerebellar vermis is somewhat
inconspicuous on today's examination possibly due to the lack of IV
contrast.
3. Periventricular white matter and corona radiata hypodensities
favor chronic ischemic microvascular white matter disease.
4. Postoperative findings in the left occipital lobe with overlying
craniotomy.
5. Mild chronic ethmoid left sphenoid sinusitis with small left
mastoid effusion.

## 2021-05-27 IMAGING — CT CT L SPINE W/O CM
3 series · 11 of 33 positions shown, 13 images · non-contrast
Comparison: PET CT [DATE], lumbar MRI [DATE]

CLINICAL DATA: Right flank pain.



[Series 1: axial lspine · axial · 0.32mm/px · z∈[-505,-335]mm · 3 of 139 slices shown, 4 images]
[im 32/139  soft-tissue]
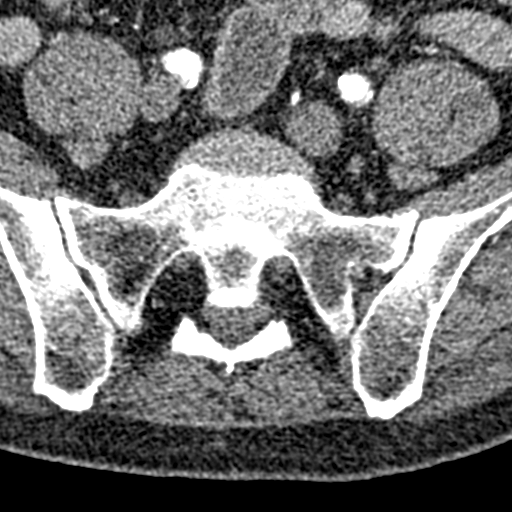
[im 32/139  bone]
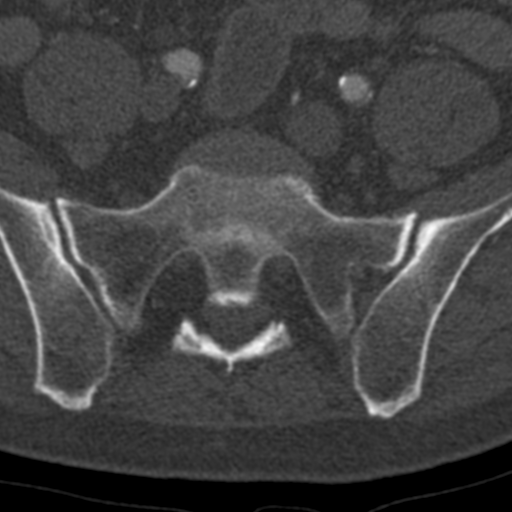
[im 75/139  bone]
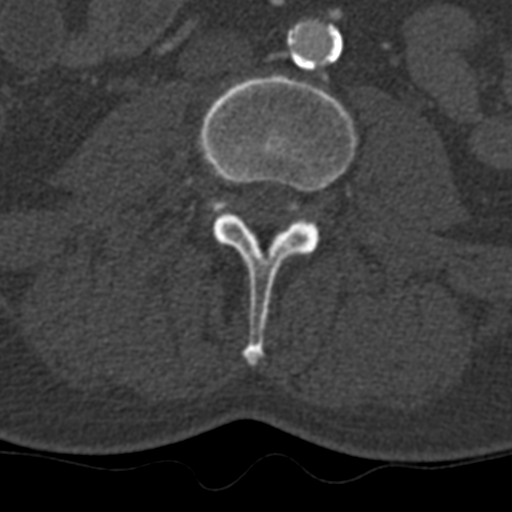
[im 117/139  bone]
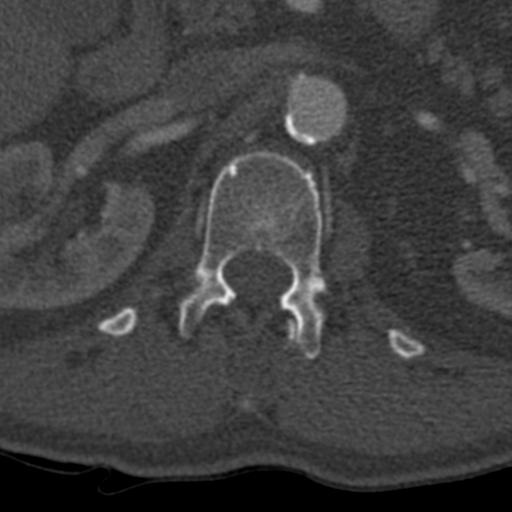

[Series 4: cor lspine bone · coronal · 0.28mm/px · 3 of 85 slices shown]
[im 17/85  bone]
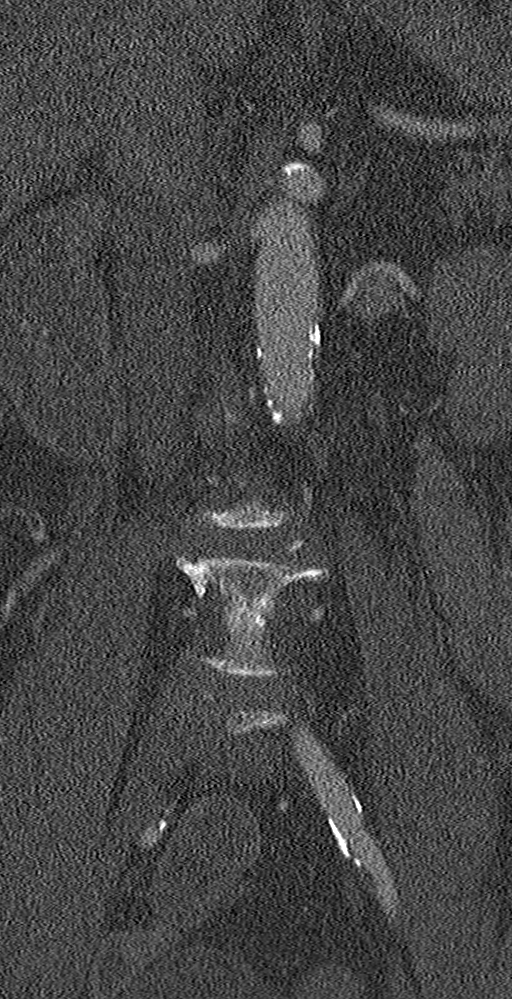
[im 34/85  bone]
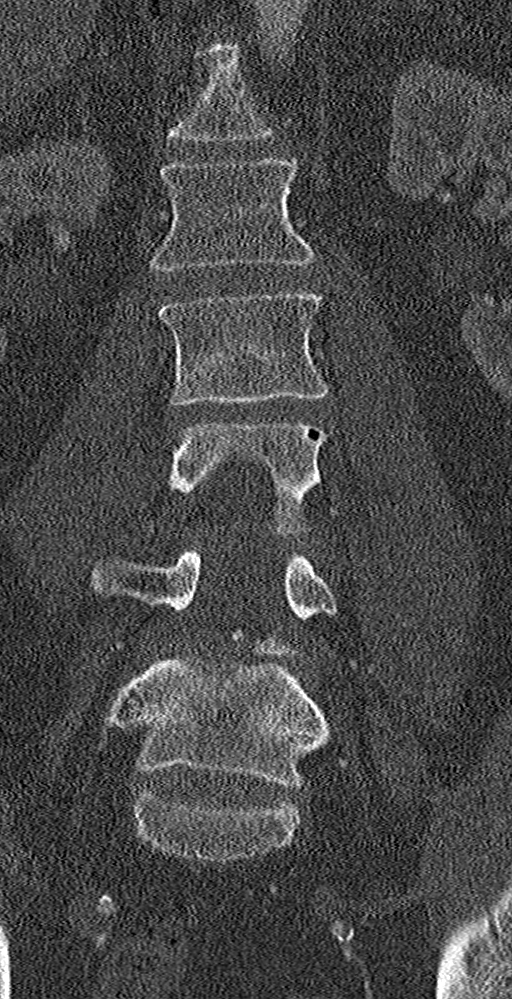
[im 51/85  bone]
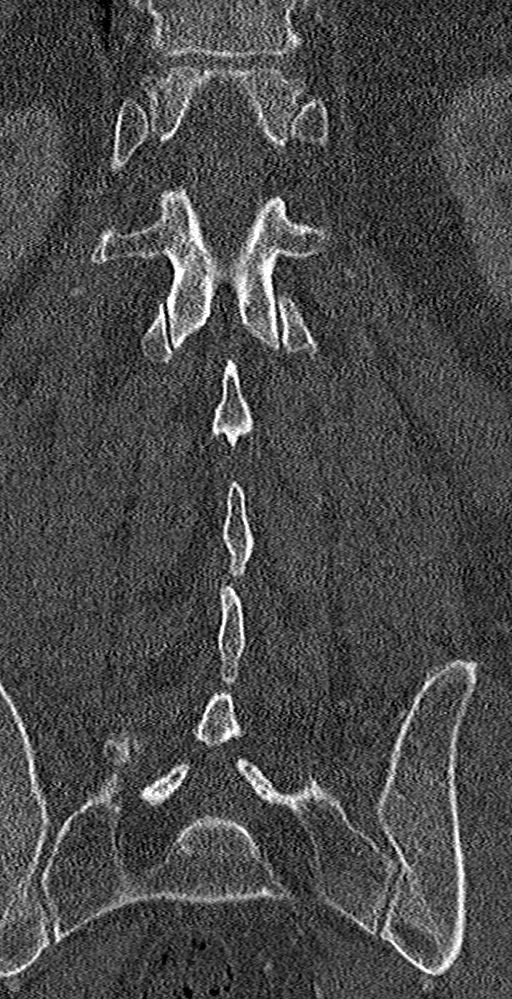

[Series 5: sag lspine bone · sagittal · 0.33mm/px · 5 of 73 slices shown, 6 images]
[im 25/73  bone]
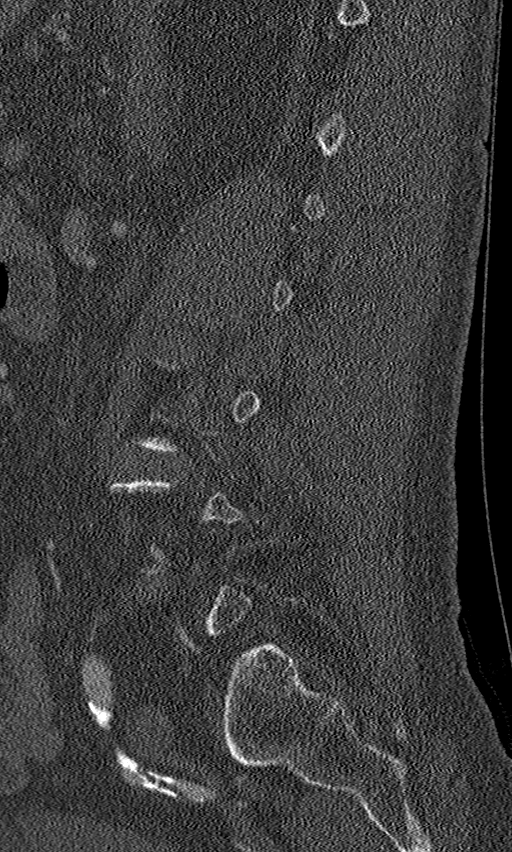
[im 31/73  bone]
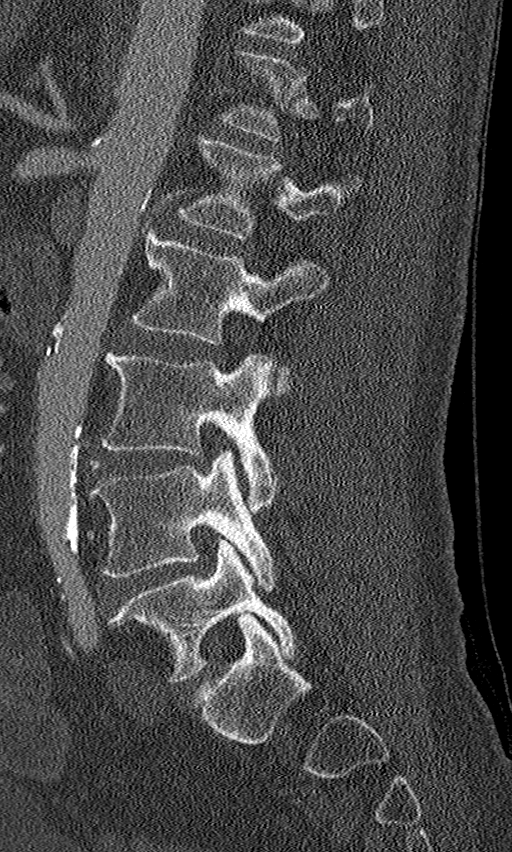
[im 37/73  soft-tissue]
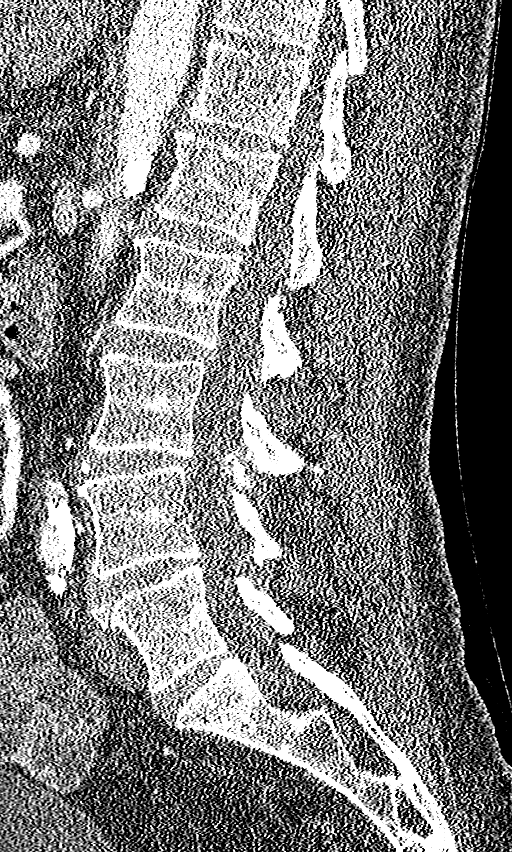
[im 37/73  bone]
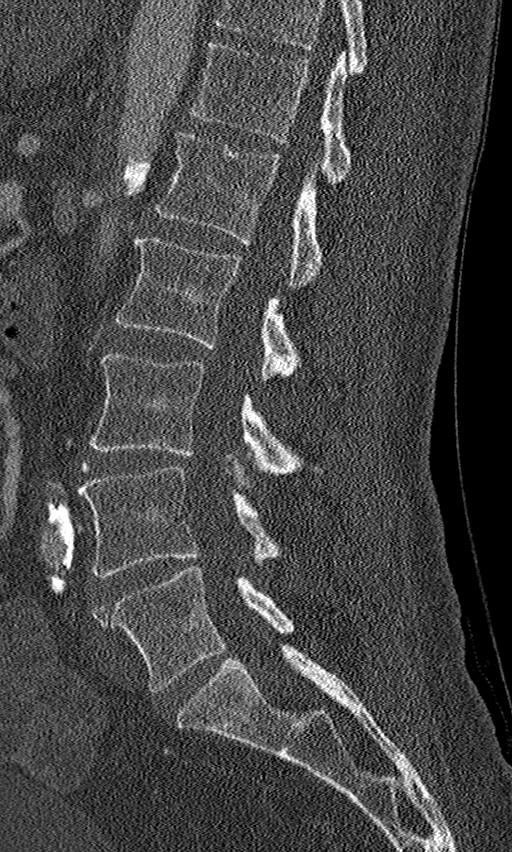
[im 43/73  bone]
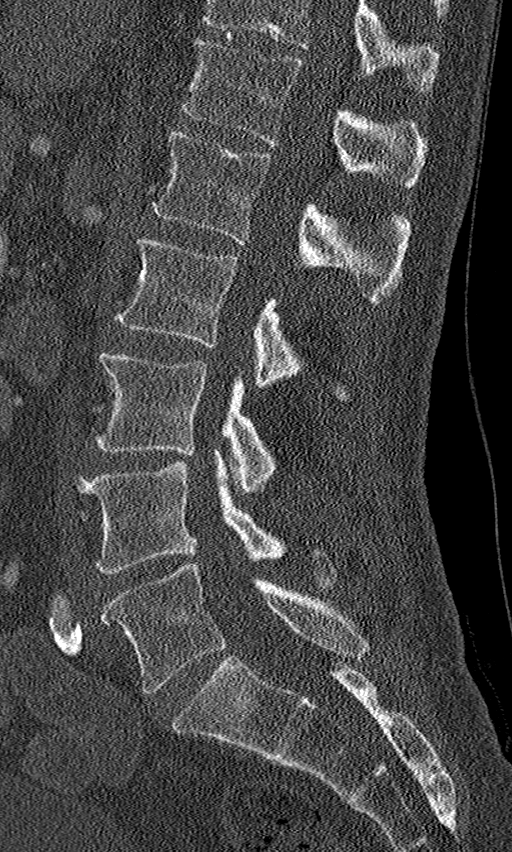
[im 49/73  bone]
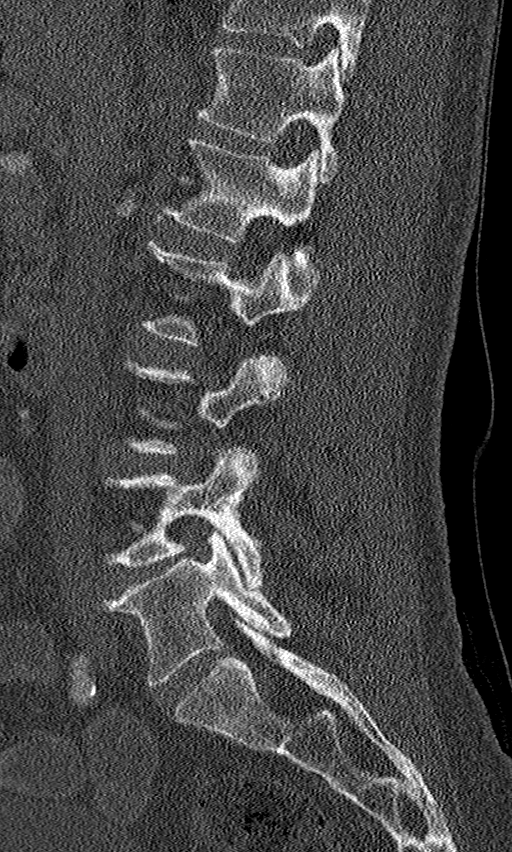

[11 of 33 positions shown; findings below may reference images not displayed]

FINDINGS: Segmentation: 5 lumbar type vertebrae.

Alignment: Trace retrolisthesis of L3 on L4. Slight levo scoliotic
curvature centered at L4-L5.

Vertebrae: No focal bone lesion, compression deformity, or
destructive process.

Paraspinal and other soft tissues: No paraspinal muscle abnormality.
Full field of view is assessed on concurrent CTA, reported
separately.

Disc levels:

L1-L2: No spinal canal or neural foraminal narrowing.

L2-L3: No spinal canal or neural foraminal narrowing.

L3-L4: Posterior spur at L3-L4, in conjunction with broad-based disc
bulge causes mild spinal canal and minor bilateral neural foraminal
stenosis.

L4-L5: Posterior disc osteophyte complex. Associated facet
hypertrophy. Mild spinal canal, moderate right and moderate left
neural foraminal narrowing.

L5-S1: Broad-based disc bulge without spinal canal or neural
foraminal narrowing.
IMPRESSION: 1. No acute abnormality of the lumbar spine.
2. Multilevel degenerative disc disease and facet hypertrophy. There
is spinal canal neural foraminal stenosis at L3-L4 and L4-L5, which
was previously assessed on MRI. Follow-up MRI could be considered
for repeat assessment as clinically indicated.

## 2021-05-27 MED ORDER — HYDROMORPHONE HCL 1 MG/ML IJ SOLN
0.5000 mg | Freq: Once | INTRAMUSCULAR | Status: AC
  Administered 2021-05-27: 20:00:00 0.5 mg via INTRAVENOUS
  Filled 2021-05-27: qty 1

## 2021-05-27 MED ORDER — OXYCODONE-ACETAMINOPHEN 5-325 MG PO TABS: 1.0000 | ORAL_TABLET | Freq: Four times a day (QID) | ORAL | 0 refills | Status: DC | PRN

## 2021-05-27 MED ORDER — MORPHINE SULFATE (PF) 4 MG/ML IV SOLN: 4.0000 mg | Freq: Once | INTRAVENOUS | Status: DC

## 2021-05-27 MED ORDER — MORPHINE SULFATE (PF) 4 MG/ML IV SOLN
4.0000 mg | Freq: Once | INTRAVENOUS | Status: AC
  Administered 2021-05-27: 18:00:00 4 mg via INTRAVENOUS
  Filled 2021-05-27: qty 1

## 2021-05-27 MED ORDER — SODIUM CHLORIDE 0.9 % IV BOLUS
1000.0000 mL | Freq: Once | INTRAVENOUS | Status: AC
Start: 2021-05-27 — End: 2021-05-27
  Administered 2021-05-27: 18:00:00 1000 mL via INTRAVENOUS

## 2021-05-27 MED ORDER — IOHEXOL 350 MG/ML SOLN
100.0000 mL | Freq: Once | INTRAVENOUS | Status: AC | PRN
  Administered 2021-05-27: 18:00:00 100 mL via INTRAVENOUS

## 2021-05-27 NOTE — Discharge Instructions (Addendum)
1. Aortic atherosclerosis without aneurysm or acute aortic finding.  ?No pulmonary embolus.  ?2. Slight prominence of the right renal pelvis and ureter without  ?obstructing stone. This may be secondary to recently urinary tract  ?infection. Recommend correlation with urinalysis. No urolithiasis.  ?3. Volume loss in the left hemithorax, patient with history of lung  ?cancer. Small nodules in the right lung base measuring 3-4 mm are  ?similar to prior PET. Attention at follow-up recommended.  ?4. Colonic diverticulosis without diverticulitis.  ?5. Moderate colonic stool burden suggesting constipation.  ? ?Above is a copy of your CT results so that you can discuss these findings with your oncologist.  As we discussed we did not find any acute abnormality to explain the right flank pain today.  It is possible it has to do with some of the kidney inflammation noted although without association with urinary tract infection or kidney stone it is difficult to understand the significance of this inflammation.  I do recommend following up with your oncologist to discuss potential PET scan of the abdomen to further evaluate for possible metastatic lesions in the area.  As we discussed it could also be secondary to your brain metastases and their relation to your perception of pain or bodily sensations.  It is unclear based on your assessment today exactly what is going on however. ? ?Please take the additional narcotic pain medication I am prescribing in addition to your at home pain medications.  Do not take Tylenol at the same time as this medication or it contains Tylenol.  Finally please return to the emergency department if your pain significantly worsens, you lose consciousness, develop a fever, nausea, vomiting or diarrhea.  It was a pleasure taking care of you today. ?

## 2021-05-27 NOTE — Telephone Encounter (Signed)
Message received from patient's wife Bill Garza, stating that pt went to his PCP on 06/14/21 for a new pain to his right lower side and was prescribed Meloxicam, Flexeril and Senokot.  Pt was instructed per his PCP that if pain did not subside to f/u with [name].  Pt.'s wife states that pt is now doubled over in pain and that his pain is at least "three times worse than prior pain to right side."  [name] notified and order received for pt to go to the [facility] now.  Pt.'s wife states that she will take pt to the [facility] ER now.  ?

## 2021-05-27 NOTE — ED Triage Notes (Signed)
Patient reports R flank pain that wraps around into his abdomen. Has taking a meloxicam at home, is getting trt for a pulled muscle. Also reports a HA. Denies urinary changes. Denies N/V.  ?

## 2021-05-27 NOTE — ED Provider Notes (Signed)
[facility] DEPT Provider Note   CSN: [id number] Arrival date & time: 05/27/21  1625     History  Chief Complaint  Patient presents with   Flank Pain    Bill Garza is a 61 y.o. 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  Patient denies significant headache but does report a tingling sensation in his left temple.  He denies any nausea, vomiting, dysuria, hematuria.  He has tried meloxicam, Flexeril, and ibuprofen at home with no relief of symptoms.  Patient reports no new weakness, numbness, difficulty walking.  He denies pain in the spine region and reports that it is more in his right side of the lower back.  He had a CT renal stone study on Monday which did not show any significant abnormalities other than some constipation.  Patient reports that he has had 2 bowel movements since this visit, is taking some laxatives at home.   Flank Pain Associated symptoms include abdominal pain.      Home Medications Prior to Admission medications   Medication Sig Start Date End Date Taking? Authorizing Provider  oxyCODONE-acetaminophen (PERCOCET/ROXICET) 5-325 MG tablet Take 1 tablet by mouth every 6 (six) hours as needed for severe pain. 05/27/21  Yes Trai Ells H, PA-C  ACCU-CHEK GUIDE test strip CHECK BLOOD SUGAR DAILY 11/20/20   [provider]  acetaminophen (TYLENOL) 325 MG tablet Take by mouth. 11/18/20   [provider]  albuterol (PROVENTIL) (2.5 MG/3ML) 0.083% nebulizer solution  01/25/21   [provider]  albuterol (VENTOLIN HFA) 108 (90 Base) MCG/ACT inhaler  06/11/20   [provider]  aspirin EC 81 MG tablet Take 81 mg by mouth daily.    [provider]  Bempedoic Acid (NEXLETOL) 180 MG TABS Take 1 tablet by mouth daily. 09/22/20   [provider]  Blood Glucose Monitoring Suppl (ACCU-CHEK GUIDE) w/Device KIT USE AS DIRECTED TO CHECK BLOOD SUGAR 11/20/20   [provider]  Blood Glucose Monitoring Suppl (GLUCOCOM BLOOD GLUCOSE MONITOR) DEVI Use to check blood sugar daily. 11/20/20   [provider]  budesonide-formoterol (SYMBICORT) 160-4.5 MCG/ACT inhaler Inhale 2 puffs into the lungs 2 (two) times daily.    [provider]  escitalopram (LEXAPRO) 5 MG tablet Take 5 mg by mouth daily.    [provider]  fluocinolone (VANOS) 0.01 % cream Apply topically. 03/18/20   [provider]  glucose blood (PRECISION QID TEST) test strip Use to check blood sugar daily. 11/20/20   [provider]  Lancets MISC Use to check blood sugar daily. 11/20/20   [provider]  LORazepam (ATIVAN) 1 MG tablet Take 1 tablet 60 min prior to PET or MRI, and one more tablet 30 minutes prior to imaging if needed for anxiety. (Take 1- 2 tablets PRN anxiety prior to imaging). 04/06/21   Eppie Gibson, MD  metFORMIN (GLUCOPHAGE) 500 MG tablet Take by mouth daily.    [provider]  montelukast (SINGULAIR) 10 MG tablet Take 10 mg by mouth at bedtime.    [provider]  Multiple Vitamin (MULTIVITAMIN) capsule Take 1 capsule by mouth daily.    [provider]  naloxone Karma Greaser) nasal spray 4 mg/0.1 mL  12/05/20   [provider]  nitroGLYCERIN (NITROSTAT) 0.4 MG SL tablet  05/13/15   [provider]  pentoxifylline (TRENTAL) 400 MG CR tablet Take 451m tab daily x 1 week, then 405mBID; take with food 04/06/21   SqEppie GibsonMD  pravastatin (PRAVACHOL) 80 MG tablet Take 80 mg by mouth daily.    [provider]  promethazine (PHENERGAN) 25 MG suppository Place 1 suppository (25 mg total) rectally every 6 (six) hours as needed for nausea or vomiting. Patient not taking: Reported on 01/27/2021 07/12/18   BuHayden RasmussenMD  vitamin E 180 MG (400 UNITS)  capsule Take 400 IU daily x 1 week, then 400 IU BID 04/06/21   SqEppie GibsonMD      Allergies    Gabapentin, Pregabalin, Bupropion, Duloxetine, Ezetimibe, Levetiracetam, Other, Prochlorperazine, and Lamotrigine    Review of Systems   Review of Systems  Gastrointestinal:  Positive for abdominal pain.  Genitourinary:  Positive for flank pain.  All other systems reviewed and are negative.  Physical Exam Updated Vital Signs BP (!) 146/80    Pulse 76    Temp 98 F (36.7 C) (Oral)    Resp 14    Ht 6' (1.829 m)    Wt 78 kg    SpO2 98%    BMI 23.32 kg/m  Physical Exam Vitals and nursing note reviewed.  Constitutional:      General: He is not in acute distress.    Appearance: Normal appearance.  HENT:     Head: Normocephalic and atraumatic.  Eyes:     General:        Right eye: No discharge.        Left eye: No discharge.  Cardiovascular:     Rate and Rhythm: Normal rate and regular rhythm.     Heart sounds: No murmur heard.   No friction rub. No gallop.  Pulmonary:     Effort: Pulmonary effort is normal.     Breath sounds: Normal breath sounds.  Abdominal:     General: Bowel sounds are normal.     Palpations: Abdomen is soft.  Musculoskeletal:     Comments: Patient with some tenderness in the right lower back without step-off or deformity, no midline spinal tenderness.  Intact range of motion of the thoracic, lumbar, cervical spines.  Patient with abdominal pain most prominent in the right upper quadrant.  No significant pain elsewhere in the abdomen.  No rebound, rigidity, guarding.  Skin:    General: Skin is warm and dry.     Capillary Refill: Capillary refill takes less than 2 seconds.  Neurological:     Mental Status: He is alert and oriented to person, place, and time.     Comments: Cranial nerves II through XII grossly intact.  Intact finger-nose, intact heel-to-shin.  Romberg negative, gait normal.  Alert and oriented x3.  Moves all 4 limbs spontaneously, normal  coordination.  No pronator drift.  Intact strength 5 out of 5 bilateral upper and lower extremities.    Psychiatric:        Mood and Affect: Mood normal.        Behavior: Behavior normal.    ED Results / Procedures / Treatments   Labs (all labs ordered are listed, but only abnormal results are displayed) Labs Reviewed  COMPREHENSIVE METABOLIC PANEL - Abnormal; Notable for the following components:      Result Value   Glucose, Bld 121 (*)    Total Bilirubin 0.2 (*)    All other components within normal limits  LIPASE, BLOOD - Abnormal; Notable for  the following components:   Lipase 167 (*)    All other components within normal limits  CBC WITH DIFFERENTIAL/PLATELET - Abnormal; Notable for the following components:   RDW 16.0 (*)    All other components within normal limits  URINALYSIS, ROUTINE W REFLEX MICROSCOPIC - Abnormal; Notable for the following components:   Color, Urine STRAW (*)    Specific Gravity, Urine 1.003 (*)    All other components within normal limits    EKG None  Radiology CT Head Wo Contrast  Result Date: 05/27/2021 CLINICAL DATA:  Headache. Right flank pain. History of non-small cell lung cancer metastatic to brain. EXAM: CT HEAD WITHOUT CONTRAST TECHNIQUE: Contiguous axial images were obtained from the base of the skull through the vertex without intravenous contrast. RADIATION DOSE REDUCTION: This exam was performed according to the departmental dose-optimization program which includes automated exposure control, adjustment of the mA and/or kV according to patient size and/or use of iterative reconstruction technique. COMPARISON:  MRI brain 04/01/2021 FINDINGS: Brain: There is relatively low conspicuity of the known metastatic lesion of the cerebellar vermis possibly due to the lack of IV contrast on today's examination. There is abnormal hypodensity in the left frontal lobe white matter favoring vasogenic edema and possibly associated with the previously seen  enhancing left frontal lobe metastatic lesion shown on the MRI from 04/01/2021. Postoperative findings in the left occipital lobe noted with overlying craniotomy site. Periventricular white matter and corona radiata hypodensities favor chronic ischemic microvascular white matter disease. The brainstem and cerebral peduncles appear unremarkable as do the basal ganglia. No midline shift or observed intracranial hemorrhage. No acute CVA is identified. Vascular: There is atherosclerotic calcification of the cavernous carotid arteries bilaterally. Skull: Left occipitoparietal craniotomy. Sinuses/Orbits: Mild chronic ethmoid sinusitis. Mild chronic left sphenoid sinusitis. Small left mastoid effusion. Other: No supplemental non-categorized findings. IMPRESSION: 1. Potentially increased vasogenic edema in the left frontal lobe likely associated with the known left frontal lobe metastatic lesion. 2. The known metastatic lesion in the cerebellar vermis is somewhat inconspicuous on today's examination possibly due to the lack of IV contrast. 3. Periventricular white matter and corona radiata hypodensities favor chronic ischemic microvascular white matter disease. 4. Postoperative findings in the left occipital lobe with overlying craniotomy. 5. Mild chronic ethmoid left sphenoid sinusitis with small left mastoid effusion. Electronically Signed   By: Van Clines M.D.   On: 05/27/2021 19:01   CT T-SPINE NO CHARGE  Result Date: 05/27/2021 CLINICAL DATA:  Right flank pain. EXAM: CT THORACIC SPINE WITHOUT CONTRAST TECHNIQUE: Multidetector CT images of the thoracic were obtained using the standard protocol without intravenous contrast. RADIATION DOSE REDUCTION: This exam was performed according to the departmental dose-optimization program which includes automated exposure control, adjustment of the mA and/or kV according to patient size and/or use of iterative reconstruction technique. COMPARISON:  PET CT 03/12/2021  FINDINGS: Alignment: Normal. Vertebrae: No evidence of focal lesion or compression fracture. No focal pathologic process. Paraspinal and other soft tissues: No paraspinal muscle abnormality. Full field of view assessment on concurrent chest CTA, reported separately. Disc levels: Mild degenerative disc disease with anterior spurring. No spinal canal stenosis. IMPRESSION: Mild degenerative disc disease. No acute osseous abnormality of the thoracic spine. Electronically Signed   By: Keith Rake M.D.   On: 05/27/2021 19:20   CT L-SPINE NO CHARGE  Result Date: 05/27/2021 CLINICAL DATA:  Right flank pain. EXAM: CT LUMBAR SPINE WITHOUT CONTRAST TECHNIQUE: Multidetector CT imaging of the lumbar spine was performed without intravenous  contrast administration. Multiplanar CT image reconstructions were also generated. RADIATION DOSE REDUCTION: This exam was performed according to the departmental dose-optimization program which includes automated exposure control, adjustment of the mA and/or kV according to patient size and/or use of iterative reconstruction technique. COMPARISON:  PET CT 03/12/2021, lumbar MRI 06/22/2018 FINDINGS: Segmentation: 5 lumbar type vertebrae. Alignment: Trace retrolisthesis of L3 on L4. Slight levo scoliotic curvature centered at L4-L5. Vertebrae: No focal bone lesion, compression deformity, or destructive process. Paraspinal and other soft tissues: No paraspinal muscle abnormality. Full field of view is assessed on concurrent CTA, reported separately. Disc levels: L1-L2: No spinal canal or neural foraminal narrowing. L2-L3: No spinal canal or neural foraminal narrowing. L3-L4: Posterior spur at L3-L4, in conjunction with broad-based disc bulge causes mild spinal canal and minor bilateral neural foraminal stenosis. L4-L5: Posterior disc osteophyte complex. Associated facet hypertrophy. Mild spinal canal, moderate right and moderate left neural foraminal narrowing. L5-S1: Broad-based disc  bulge without spinal canal or neural foraminal narrowing. IMPRESSION: 1. No acute abnormality of the lumbar spine. 2. Multilevel degenerative disc disease and facet hypertrophy. There is spinal canal neural foraminal stenosis at L3-L4 and L4-L5, which was previously assessed on MRI. Follow-up MRI could be considered for repeat assessment as clinically indicated. Electronically Signed   By: Keith Rake M.D.   On: 05/27/2021 19:27   CT Angio Chest/Abd/Pel for Dissection W and/or Wo Contrast  Result Date: 05/27/2021 CLINICAL DATA:  Acute aortic syndrome (AAS) suspected Patient reports right flank pain. EXAM: CT ANGIOGRAPHY CHEST, ABDOMEN AND PELVIS TECHNIQUE: Non-contrast CT of the chest was initially obtained. Multidetector CT imaging through the chest, abdomen and pelvis was performed using the standard protocol during bolus administration of intravenous contrast. Multiplanar reconstructed images and MIPs were obtained and reviewed to evaluate the vascular anatomy. RADIATION DOSE REDUCTION: This exam was performed according to the departmental dose-optimization program which includes automated exposure control, adjustment of the mA and/or kV according to patient size and/or use of iterative reconstruction technique. CONTRAST:  125m OMNIPAQUE IOHEXOL 350 MG/ML SOLN COMPARISON:  Abdomen pelvis CT 05/24/2021, PET CT 03/12/2021 FINDINGS: CTA CHEST FINDINGS Cardiovascular: Atherosclerosis of the thoracic aorta. No aortic hematoma noncontrast exam. No dissection, aneurysm, or evidence of acute aortic syndrome. Conventional branching pattern from the aortic arch with patent branch vessels. There is no pulmonary embolus to the segmental level. The heart is normal in size. Coronary artery calcifications. No pericardial effusion. Mediastinum/Nodes: No enlarged mediastinal or hilar lymph nodes. Patulous esophagus without wall thickening. No visualized thyroid nodule. Lungs/Pleura: Patient with history of non-small cell  lung cancer. There is stable volume loss in the left hemithorax. Scarring at the left lung base is similar to prior. Central bronchial thickening. Minimal retained mucus in the trachea. Mild emphysema. No confluent consolidation. No pleural effusion. Multiple small pulmonary nodules in the right lower and right middle lobes, also seen on prior exams. These measure from 3-4 mm. Nodules are seen on series 6 images 79 (2 separate nodules), 80, 81, and 89. Musculoskeletal: Thoracic spine assessed on concurrent thoracic spine reformats, reported separately. No fracture or acute osseous findings of the ribs or sternum. Review of the MIP images confirms the above findings. CTA ABDOMEN AND PELVIS FINDINGS VASCULAR Aorta: Moderate atherosclerosis. No aneurysm, dissection, vasculitis or significant stenosis. Celiac: Mild plaque at the origin. No significant stenosis. No acute findings. Branch vessels are patent. SMA: Patent without evidence of aneurysm, dissection, vasculitis or significant stenosis. Replaced right hepatic artery arises from the SMA. Renals: Both  renal arteries are patent without evidence of aneurysm, dissection, vasculitis, fibromuscular dysplasia or significant stenosis. IMA: Patent without evidence of aneurysm, dissection, vasculitis or significant stenosis. Inflow: External iliac arteries are patent without evidence of aneurysm, dissection, vasculitis or significant stenosis. The internal iliac arteries are small in caliber and demonstrate intermittent stenosis. Veins: No obvious venous abnormality within the limitations of this arterial phase study. Review of the MIP images confirms the above findings. NON-VASCULAR Hepatobiliary: No evidence of focal hepatic lesion on this arterial phase exam. Gallbladder physiologically distended, no calcified stone. No biliary dilatation. Pancreas: No ductal dilatation or inflammation. Spleen: Normal in size and arterial enhancement. Adrenals/Urinary Tract: No adrenal  nodule. Slight prominence of the right renal pelvis and ureter. No evidence of renal or ureteral calculi. No perinephric edema. Homogeneous renal enhancement. Unremarkable urinary bladder. Stomach/Bowel: Sigmoid colonic diverticulosis without diverticulitis. Moderate volume of colonic stool. No bowel obstruction or inflammation. Normal appendix. Tiny hiatal hernia, otherwise unremarkable stomach. Lymphatic: No enlarged lymph nodes in the abdomen or pelvis. Reproductive: Prostate is unremarkable. Other: Fat in both inguinal canals.  No ascites or free air. Musculoskeletal: Lumbar spine assessed on concurrent lumbar spine reformats, reported separately. No acute bony findings the pelvis. Review of the MIP images confirms the above findings. IMPRESSION: 1. Aortic atherosclerosis without aneurysm or acute aortic finding. No pulmonary embolus. 2. Slight prominence of the right renal pelvis and ureter without obstructing stone. This may be secondary to recently urinary tract infection. Recommend correlation with urinalysis. No urolithiasis. 3. Volume loss in the left hemithorax, patient with history of lung cancer. Small nodules in the right lung base measuring 3-4 mm are similar to prior PET. Attention at follow-up recommended. 4. Colonic diverticulosis without diverticulitis. 5. Moderate colonic stool burden suggesting constipation. Aortic Atherosclerosis (ICD10-I70.0) and Emphysema (ICD10-J43.9). Electronically Signed   By: Keith Rake M.D.   On: 05/27/2021 19:14    Procedures Procedures    Medications Ordered in ED Medications  sodium chloride 0.9 % bolus 1,000 mL (0 mLs Intravenous Stopped 05/27/21 1953)  morphine (PF) 4 MG/ML injection 4 mg (4 mg Intravenous Given 05/27/21 1741)  iohexol (OMNIPAQUE) 350 MG/ML injection 100 mL (100 mLs Intravenous Contrast Given 05/27/21 1826)  HYDROmorphone (DILAUDID) injection 0.5 mg (0.5 mg Intravenous Given 05/27/21 1951)    ED Course/ Medical Decision Making/ A&P                            Medical Decision Making Amount and/or Complexity of Data Reviewed Labs: ordered. Radiology: ordered.  Risk Prescription drug management.   I discussed this case with my attending physician who cosigned this note including patient's presenting symptoms, physical exam, and planned diagnostics and interventions. Attending physician stated agreement with plan or made changes to plan which were implemented.   This patient presents to the ED for concern of right flank pain that wraps around into the right upper quadrant, he also does endorse some headache, he denies any urinary changes, hematuria, history of kidney stones, this involves an extensive number of treatment options, and is a complaint that carries with it a high risk of complications and morbidity. The emergent differential diagnosis prior to evaluation includes, but is not limited to, acute abdominal aortic dissection, or for lithiasis, pyelonephritis, mesenteric ischemia, other acute intra-abdominal abnormality, new metastasis from patient's known cancer versus other.   This is not an exhaustive differential.   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 External records from outside source obtained and reviewed including oncology visits, recent evaluation at outside facility, CT renal stone study performed on Monday which was negative for nephrolithiasis.  Physical Exam: Physical exam performed. The pertinent findings include: Tenderness to palpation right upper quadrant, as well as right flank.  No spinal tenderness to palpation, step-off or deformity.  No rebound, rigidity, guarding throughout.  No focal neurodeficits noted.  Lab Tests: I ordered, and personally interpreted labs.  The pertinent results include: Unremarkable urinalysis, elevation of  lipase at 167, which is suspicious for pancreatitis, however patient with no epigastric pain, history of alcohol abuse, history of gallstones, I do not know that this is a satisfying explanation for patient's pain, and his lipase just barely meets the 3 times the upper normal limits.  CBC is unremarkable, urinalysis is unremarkable.   Imaging Studies: I ordered imaging studies including CT abdomen dissection study, CT L-spine, CT C-spine, and CT head without contrast. I independently visualized and interpreted imaging which showed some inflammation of the right kidney without clear source, evidence of kidney stone.  No evidence of pancreatic pseudocyst, pancreatic rupture.  Some colonic stool burden suggestive of constipation. I agree with the radiologist interpretation.   Cardiac Monitoring:  The patient was maintained on a cardiac monitor.  My attending physician Dr. Johnney Killian viewed and interpreted the cardiac monitored which showed an underlying rhythm of: Sinus rhythm with right bundle branch block, no evidence of ischemia.   Medications: I ordered medication including fluid bolus, morphine, Dilaudid for pain. Reevaluation of the patient after these medicines showed that the patient improved. I have reviewed the patients home medicines and have made adjustments as needed.  Disposition: After consideration of the diagnostic results and the patients response to treatment, I feel that performed shared decision making with patient and after discussion with my attending Dr. Vallery Ridge.  Patient's work-up today does not show any evidence of emergent cause for his right flank pain, acute dissection, and there is no clear explanation for his right-sided kidney inflammation as he does not seem to have any evidence of urinary tract infection or nephrolithiasis.  He does have an elevated lipase but his pain does not originate in his epigastrium, he has no evidence of pancreatic abnormality on his CT, and no  history that would make me suspicious for acute pancreatitis.  Discussed gentle diet, fluids, and will discharge with pain medication.  Encourage follow-up with oncologist in the morning for further evaluation and recheck.  Patient understands and agrees to plan, discharged in stable condition at this time.  His pain is improved at time of discharge..   Final Clinical Impression(s) / ED Diagnoses Final diagnoses:  Flank pain    Rx / DC Orders ED Discharge Orders          Ordered    oxyCODONE-acetaminophen (PERCOCET/ROXICET) 5-325 MG tablet  Every 6 hours PRN        05/27/21 2016              Anselmo Pickler, PA-C 05/27/21 2108    Charlesetta Shanks, MD 05/28/21 1526

## 2021-05-28 ENCOUNTER — Telehealth: Payer: Self-pay | Admitting: *Deleted

## 2021-05-28 NOTE — Telephone Encounter (Signed)
Call received from patient's wife to inform [name] that pt did go to the ER yesterday and that his pain is under control at this time. Pt.'s wife states that the ER did provide enough Percocet to get the pt through the weekend.  Instructed pt.'s wife to contact the office next week if further pain medicine is needed prior to patient's next appt on Weds, 06/02/21.   ?

## 2021-06-02 ENCOUNTER — Other Ambulatory Visit: Payer: Self-pay

## 2021-06-02 ENCOUNTER — Inpatient Hospital Stay: Payer: Medicare HMO | Attending: Hematology & Oncology

## 2021-06-02 ENCOUNTER — Inpatient Hospital Stay (HOSPITAL_BASED_OUTPATIENT_CLINIC_OR_DEPARTMENT_OTHER): Payer: Medicare HMO | Admitting: Hematology & Oncology

## 2021-06-02 ENCOUNTER — Encounter: Payer: Self-pay | Admitting: Hematology & Oncology

## 2021-06-02 VITALS — BP 124/88 | HR 87 | Temp 97.7°F | Resp 18 | Ht 72.05 in | Wt 167.1 lb

## 2021-06-02 DIAGNOSIS — Z79899 Other long term (current) drug therapy: Secondary | ICD-10-CM | POA: Insufficient documentation

## 2021-06-02 DIAGNOSIS — K5900 Constipation, unspecified: Secondary | ICD-10-CM | POA: Diagnosis not present

## 2021-06-02 DIAGNOSIS — Z881 Allergy status to other antibiotic agents status: Secondary | ICD-10-CM | POA: Diagnosis not present

## 2021-06-02 DIAGNOSIS — M47819 Spondylosis without myelopathy or radiculopathy, site unspecified: Secondary | ICD-10-CM | POA: Insufficient documentation

## 2021-06-02 DIAGNOSIS — M5136 Other intervertebral disc degeneration, lumbar region: Secondary | ICD-10-CM | POA: Insufficient documentation

## 2021-06-02 DIAGNOSIS — Z888 Allergy status to other drugs, medicaments and biological substances status: Secondary | ICD-10-CM | POA: Insufficient documentation

## 2021-06-02 DIAGNOSIS — C3492 Malignant neoplasm of unspecified part of left bronchus or lung: Secondary | ICD-10-CM | POA: Diagnosis present

## 2021-06-02 DIAGNOSIS — C7931 Secondary malignant neoplasm of brain: Secondary | ICD-10-CM | POA: Insufficient documentation

## 2021-06-02 DIAGNOSIS — K5730 Diverticulosis of large intestine without perforation or abscess without bleeding: Secondary | ICD-10-CM | POA: Diagnosis not present

## 2021-06-02 DIAGNOSIS — J323 Chronic sphenoidal sinusitis: Secondary | ICD-10-CM | POA: Insufficient documentation

## 2021-06-02 DIAGNOSIS — M4316 Spondylolisthesis, lumbar region: Secondary | ICD-10-CM | POA: Diagnosis not present

## 2021-06-02 DIAGNOSIS — M48061 Spinal stenosis, lumbar region without neurogenic claudication: Secondary | ICD-10-CM | POA: Diagnosis not present

## 2021-06-02 DIAGNOSIS — J322 Chronic ethmoidal sinusitis: Secondary | ICD-10-CM | POA: Insufficient documentation

## 2021-06-02 DIAGNOSIS — I700 Atherosclerosis of aorta: Secondary | ICD-10-CM | POA: Diagnosis not present

## 2021-06-02 DIAGNOSIS — J439 Emphysema, unspecified: Secondary | ICD-10-CM | POA: Insufficient documentation

## 2021-06-02 DIAGNOSIS — M2578 Osteophyte, vertebrae: Secondary | ICD-10-CM | POA: Insufficient documentation

## 2021-06-02 DIAGNOSIS — K449 Diaphragmatic hernia without obstruction or gangrene: Secondary | ICD-10-CM | POA: Insufficient documentation

## 2021-06-02 DIAGNOSIS — C3490 Malignant neoplasm of unspecified part of unspecified bronchus or lung: Secondary | ICD-10-CM | POA: Diagnosis not present

## 2021-06-02 LAB — CBC WITH DIFFERENTIAL (CANCER CENTER ONLY)
Abs Immature Granulocytes: 0.02 10*3/uL (ref 0.00–0.07)
Basophils Absolute: 0.1 10*3/uL (ref 0.0–0.1)
Basophils Relative: 1 %
Eosinophils Absolute: 0.1 10*3/uL (ref 0.0–0.5)
Eosinophils Relative: 2 %
HCT: 42.5 % (ref 39.0–52.0)
Hemoglobin: 13.7 g/dL (ref 13.0–17.0)
Immature Granulocytes: 0 %
Lymphocytes Relative: 31 %
Lymphs Abs: 2.2 10*3/uL (ref 0.7–4.0)
MCH: 29.6 pg (ref 26.0–34.0)
MCHC: 32.2 g/dL (ref 30.0–36.0)
MCV: 91.8 fL (ref 80.0–100.0)
Monocytes Absolute: 0.5 10*3/uL (ref 0.1–1.0)
Monocytes Relative: 7 %
Neutro Abs: 4.2 10*3/uL (ref 1.7–7.7)
Neutrophils Relative %: 59 %
Platelet Count: 373 10*3/uL (ref 150–400)
RBC: 4.63 MIL/uL (ref 4.22–5.81)
RDW: 16 % — ABNORMAL HIGH (ref 11.5–15.5)
WBC Count: 7.1 10*3/uL (ref 4.0–10.5)
nRBC: 0 % (ref 0.0–0.2)

## 2021-06-02 LAB — CMP (CANCER CENTER ONLY)
ALT: 23 U/L (ref 0–44)
AST: 19 U/L (ref 15–41)
Albumin: 4.6 g/dL (ref 3.5–5.0)
Alkaline Phosphatase: 42 U/L (ref 38–126)
Anion gap: 6 (ref 5–15)
BUN: 17 mg/dL (ref 6–20)
CO2: 28 mmol/L (ref 22–32)
Calcium: 10 mg/dL (ref 8.9–10.3)
Chloride: 103 mmol/L (ref 98–111)
Creatinine: 1.02 mg/dL (ref 0.61–1.24)
GFR, Estimated: >60 mL/min (ref >60)
Glucose, Bld: 141 mg/dL — ABNORMAL HIGH (ref 70–99)
Potassium: 4.3 mmol/L (ref 3.5–5.1)
Sodium: 137 mmol/L (ref 135–145)
Total Bilirubin: 0.4 mg/dL (ref 0.3–1.2)
Total Protein: 6.6 g/dL (ref 6.5–8.1)

## 2021-06-02 LAB — LACTATE DEHYDROGENASE: LDH: 118 U/L (ref 98–192)

## 2021-06-02 MED ORDER — OXYCODONE-ACETAMINOPHEN 5-325 MG PO TABS: 1.0000 | ORAL_TABLET | Freq: Three times a day (TID) | ORAL | 0 refills | Status: DC | PRN

## 2021-06-02 MED ORDER — LACTULOSE 10 GM/15ML PO SOLN: 20.0000 g | Freq: Three times a day (TID) | ORAL | 4 refills | Status: DC

## 2021-06-02 MED ORDER — METOCLOPRAMIDE HCL 10 MG PO TABS: 20.0000 mg | ORAL_TABLET | Freq: Three times a day (TID) | ORAL | 4 refills | Status: DC

## 2021-06-02 NOTE — Progress Notes (Signed)
?Hematology and Oncology Follow Up Visit ? ?Bill Garza ?[id number] ?07/27/1960 61 y.o. ?06/02/2021 ? ? ?Principle Diagnosis:  ?Metastatic adenocarcinoma of the lung-CNS metastasis ? ?Current Therapy:   ?Status post radiosurgery-  SBRT given on 04/13/2021 ?    ?Interim History:  Bill Garza is back for follow-up.  Unfortunately, he was having a tough time a week or so ago.  He began to have some pain over on his right flank.  He went to the emergency room.  He had a CT angiogram that was done.  This was unremarkable for any pulmonary embolism.  He had a CT of the abdomen pelvis.  This did not show any vascular issues.  He did not have any obvious kidney stone.  There was a little bit of reaction around the right kidney.  He was constipated. ? ?Constipation does seem to be a problem for him.  He is on Percocet right now.  I went ahead and sent in a prescription for lactulose and for Reglan to see if this did not help with the constipation. ? ?He has had no problems with nausea or vomiting.  He has been not eating all that much.  He is drinking quite a bit of fluid. ? ?He has had no headache.  There is been no neurological issues as far as he can tell.  I know that Radiation Oncology is following him up for the CNS issues. ? ?He has had no problems with fever.  He has had no bleeding.  He has had no leg swelling. ? ?Overall, I would say his performance status is ECOG 1.  ? ?Medications:  ?Current Outpatient Medications:  ?  ACCU-CHEK GUIDE test strip, CHECK BLOOD SUGAR DAILY, Disp: , Rfl:  ?  acetaminophen (TYLENOL) 325 MG tablet, Take by mouth., Disp: , Rfl:  ?  albuterol (PROVENTIL) (2.5 MG/3ML) 0.083% nebulizer solution, , Disp: , Rfl:  ?  albuterol (VENTOLIN HFA) 108 (90 Base) MCG/ACT inhaler, , Disp: , Rfl:  ?  aspirin EC 81 MG tablet, Take 81 mg by mouth daily., Disp: , Rfl:  ?  Bempedoic Acid (NEXLETOL) 180 MG TABS, Take 1 tablet by mouth daily., Disp: , Rfl:  ?  Blood Glucose Monitoring Suppl (ACCU-CHEK GUIDE)  w/Device KIT, USE AS DIRECTED TO CHECK BLOOD SUGAR, Disp: , Rfl:  ?  Blood Glucose Monitoring Suppl (GLUCOCOM BLOOD GLUCOSE MONITOR) DEVI, Use to check blood sugar daily., Disp: , Rfl:  ?  budesonide-formoterol (SYMBICORT) 160-4.5 MCG/ACT inhaler, Inhale 2 puffs into the lungs 2 (two) times daily., Disp: , Rfl:  ?  escitalopram (LEXAPRO) 5 MG tablet, Take 5 mg by mouth daily., Disp: , Rfl:  ?  fluocinolone (VANOS) 0.01 % cream, Apply topically., Disp: , Rfl:  ?  glucose blood (PRECISION QID TEST) test strip, Use to check blood sugar daily., Disp: , Rfl:  ?  lactulose (CHRONULAC) 10 GM/15ML solution, Take 30 mLs (20 g total) by mouth 3 (three) times daily., Disp: 1000 mL, Rfl: 4 ?  Lancets MISC, Use to check blood sugar daily., Disp: , Rfl:  ?  LORazepam (ATIVAN) 1 MG tablet, Take 1 tablet 60 min prior to PET or MRI, and one more tablet 30 minutes prior to imaging if needed for anxiety. (Take 1- 2 tablets PRN anxiety prior to imaging)., Disp: 8 tablet, Rfl: 0 ?  metFORMIN (GLUCOPHAGE) 500 MG tablet, Take by mouth daily., Disp: , Rfl:  ?  metoCLOPramide (REGLAN) 10 MG tablet, Take 2 tablets (20 mg total) by  mouth 4 (four) times daily -  before meals and at bedtime., Disp: 120 tablet, Rfl: 4 ?  montelukast (SINGULAIR) 10 MG tablet, Take 10 mg by mouth at bedtime., Disp: , Rfl:  ?  Multiple Vitamin (MULTIVITAMIN) capsule, Take 1 capsule by mouth daily., Disp: , Rfl:  ?  pentoxifylline (TRENTAL) 400 MG CR tablet, Take 470m tab daily x 1 week, then 4049mBID; take with food, Disp: 60 tablet, Rfl: 5 ?  pravastatin (PRAVACHOL) 80 MG tablet, Take 80 mg by mouth daily., Disp: , Rfl:  ?  senna-docusate (SENOKOT-S) 8.6-50 MG tablet, Take 2 tablets by mouth daily., Disp: , Rfl:  ?  vitamin E 180 MG (400 UNITS) capsule, Take 400 IU daily x 1 week, then 400 IU BID, Disp: 60 capsule, Rfl: 5 ?  naloxone (NARCAN) nasal spray 4 mg/0.1 mL, , Disp: , Rfl:  ?  nitroGLYCERIN (NITROSTAT) 0.4 MG SL tablet, , Disp: , Rfl:  ?   oxyCODONE-acetaminophen (PERCOCET/ROXICET) 5-325 MG tablet, Take 1 tablet by mouth every 8 (eight) hours as needed for severe pain., Disp: 90 tablet, Rfl: 0 ?  promethazine (PHENERGAN) 25 MG suppository, Place 1 suppository (25 mg total) rectally every 6 (six) hours as needed for nausea or vomiting. (Patient not taking: Reported on 01/27/2021), Disp: 12 each, Rfl: 0 ? ?Allergies:  ?Allergies  ?Allergen Reactions  ? Gabapentin Swelling  ?  Leg swelling ?  ? Levetiracetam Other (See Comments)  ?  Personality changes  ? Pregabalin Swelling  ?  Leg swelling ?  ? Bupropion Nausea And Vomiting  ? Duloxetine Other (See Comments)  ?  Excessive sleeping  ? Ezetimibe Other (See Comments)  ? Other Other (See Comments)  ?  Antibiotics / post. C-diff  ? Prochlorperazine Nausea And Vomiting  ? Lamotrigine Itching and Rash  ? ? ?Past Medical History, Surgical history, Social history, and Family History were reviewed and updated. ? ?Review of Systems: ?Review of Systems  ?Constitutional: Negative.   ?HENT:  Negative.    ?Eyes: Negative.   ?Respiratory: Negative.    ?Cardiovascular: Negative.   ?Gastrointestinal: Negative.   ?Endocrine: Negative.   ?Genitourinary: Negative.    ?Musculoskeletal: Negative.   ?Skin: Negative.   ?Neurological: Negative.   ?Hematological: Negative.   ?Psychiatric/Behavioral:  The patient is nervous/anxious.   ? ?Physical Exam: ? height is 6' 0.05" (1.83 m) and weight is 75.8 kg. His oral temperature is 97.7 ?F (36.5 ?C). His blood pressure is 124/88 and his pulse is 87. His respiration is 18 and oxygen saturation is 100%.  ? ?Wt Readings from Last 3 Encounters:  ?06/02/21 75.8 kg  ?05/27/21 78 kg  ?04/30/21 76.7 kg  ? ? ?Physical Exam ?Vitals reviewed.  ?HENT:  ?   Head: Normocephalic and atraumatic.  ?Eyes:  ?   Pupils: Pupils are equal, round, and reactive to light.  ?Cardiovascular:  ?   Rate and Rhythm: Normal rate and regular rhythm.  ?   Heart sounds: Normal heart sounds.  ?Pulmonary:  ?   Effort:  Pulmonary effort is normal.  ?   Breath sounds: Normal breath sounds.  ?Abdominal:  ?   General: Bowel sounds are normal.  ?   Palpations: Abdomen is soft.  ?Musculoskeletal:     ?   General: No tenderness or deformity. Normal range of motion.  ?   Cervical back: Normal range of motion.  ?Lymphadenopathy:  ?   Cervical: No cervical adenopathy.  ?Skin: ?   General: Skin is warm  and dry.  ?   Findings: No erythema or rash.  ?   Comments: On his skin exam, there are very scattered small erythematous maculopapular lesions.  These are maybe a millimeter in size.  They are pruritic.  There might be a central eschar.  There is no pustules.  ?Neurological:  ?   Mental Status: He is alert and oriented to person, place, and time.  ?Psychiatric:     ?   Behavior: Behavior normal.     ?   Thought Content: Thought content normal.     ?   Judgment: Judgment normal.  ? ? ? ?Lab Results  ?Component Value Date  ? WBC 7.1 06/02/2021  ? HGB 13.7 06/02/2021  ? HCT 42.5 06/02/2021  ? MCV 91.8 06/02/2021  ? PLT 373 06/02/2021  ? ?  Chemistry   ?   ?Component Value Date/Time  ? NA 137 06/02/2021 1505  ? K 4.3 06/02/2021 1505  ? CL 103 06/02/2021 1505  ? CO2 28 06/02/2021 1505  ? BUN 17 06/02/2021 1505  ? CREATININE 1.02 06/02/2021 1505  ?    ?Component Value Date/Time  ? CALCIUM 10.0 06/02/2021 1505  ? ALKPHOS 42 06/02/2021 1505  ? AST 19 06/02/2021 1505  ? ALT 23 06/02/2021 1505  ? BILITOT 0.4 06/02/2021 1505  ?  ? ? ?Impression and Plan: ?Bill Garza is a very nice 61 year old white male.  He has a history of metastatic adenocarcinoma of the lung.  He initially presented with localized-stage IIB -adenocarcinoma of the left lung.  He was placed on clinical trial.  He only had 1 cycle of treatment. ? 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  He did undergo resection of a lesion back in August 2022.  He then underwent radiosurgery. ? ?He has had another round of radiosurgery.  Hopefully this is been helpful for him. ? ?Again  I am not sure exactly why he is having this pain.  It may be constipation.  I would not think that it would be metastatic disease.  However, I think we do need to get a PET scan on him.  The last PET scan was done back

## 2021-06-08 ENCOUNTER — Emergency Department (HOSPITAL_COMMUNITY): Payer: Medicare HMO

## 2021-06-08 ENCOUNTER — Emergency Department (HOSPITAL_COMMUNITY)
Admission: EM | Admit: 2021-06-08 | Discharge: 2021-06-08 | Disposition: A | Discharge status: Home / Self Care | Payer: Medicare HMO | Attending: Emergency Medicine | Admitting: Emergency Medicine

## 2021-06-08 ENCOUNTER — Other Ambulatory Visit: Payer: Self-pay

## 2021-06-08 ENCOUNTER — Encounter (HOSPITAL_COMMUNITY): Payer: Self-pay

## 2021-06-08 DIAGNOSIS — Z7984 Long term (current) use of oral hypoglycemic drugs: Secondary | ICD-10-CM | POA: Diagnosis not present

## 2021-06-08 DIAGNOSIS — G936 Cerebral edema: Secondary | ICD-10-CM | POA: Insufficient documentation

## 2021-06-08 DIAGNOSIS — Z85118 Personal history of other malignant neoplasm of bronchus and lung: Secondary | ICD-10-CM | POA: Insufficient documentation

## 2021-06-08 DIAGNOSIS — R21 Rash and other nonspecific skin eruption: Secondary | ICD-10-CM | POA: Insufficient documentation

## 2021-06-08 DIAGNOSIS — I10 Essential (primary) hypertension: Secondary | ICD-10-CM | POA: Insufficient documentation

## 2021-06-08 DIAGNOSIS — E119 Type 2 diabetes mellitus without complications: Secondary | ICD-10-CM | POA: Diagnosis not present

## 2021-06-08 DIAGNOSIS — Z7901 Long term (current) use of anticoagulants: Secondary | ICD-10-CM | POA: Diagnosis not present

## 2021-06-08 DIAGNOSIS — I2510 Atherosclerotic heart disease of native coronary artery without angina pectoris: Secondary | ICD-10-CM | POA: Insufficient documentation

## 2021-06-08 DIAGNOSIS — R109 Unspecified abdominal pain: Secondary | ICD-10-CM | POA: Insufficient documentation

## 2021-06-08 DIAGNOSIS — Z7982 Long term (current) use of aspirin: Secondary | ICD-10-CM | POA: Insufficient documentation

## 2021-06-08 DIAGNOSIS — M549 Dorsalgia, unspecified: Secondary | ICD-10-CM | POA: Diagnosis present

## 2021-06-08 DIAGNOSIS — J449 Chronic obstructive pulmonary disease, unspecified: Secondary | ICD-10-CM | POA: Insufficient documentation

## 2021-06-08 LAB — CBC
HCT: 41.2 % (ref 39.0–52.0)
Hemoglobin: 13.4 g/dL (ref 13.0–17.0)
MCH: 29.9 pg (ref 26.0–34.0)
MCHC: 32.5 g/dL (ref 30.0–36.0)
MCV: 92 fL (ref 80.0–100.0)
Platelets: 364 10*3/uL (ref 150–400)
RBC: 4.48 MIL/uL (ref 4.22–5.81)
RDW: 16.3 % — ABNORMAL HIGH (ref 11.5–15.5)
WBC: 6.9 10*3/uL (ref 4.0–10.5)
nRBC: 0 % (ref 0.0–0.2)

## 2021-06-08 LAB — COMPREHENSIVE METABOLIC PANEL
ALT: 25 U/L (ref 0–44)
AST: 22 U/L (ref 15–41)
Albumin: 4.1 g/dL (ref 3.5–5.0)
Alkaline Phosphatase: 46 U/L (ref 38–126)
Anion gap: 8 (ref 5–15)
BUN: 11 mg/dL (ref 6–20)
CO2: 24 mmol/L (ref 22–32)
Calcium: 9.1 mg/dL (ref 8.9–10.3)
Chloride: 104 mmol/L (ref 98–111)
Creatinine, Ser: 0.84 mg/dL (ref 0.61–1.24)
GFR, Estimated: >60 mL/min (ref >60)
Glucose, Bld: 127 mg/dL — ABNORMAL HIGH (ref 70–99)
Potassium: 4.2 mmol/L (ref 3.5–5.1)
Sodium: 136 mmol/L (ref 135–145)
Total Bilirubin: 0.3 mg/dL (ref 0.3–1.2)
Total Protein: 7.1 g/dL (ref 6.5–8.1)

## 2021-06-08 LAB — TROPONIN I (HIGH SENSITIVITY): Troponin I (High Sensitivity): 7 ng/L (ref <18)

## 2021-06-08 LAB — LIPASE, BLOOD: Lipase: 138 U/L — ABNORMAL HIGH (ref 11–51)

## 2021-06-08 IMAGING — MR MR HEAD WO/W CM
15 series · 48 of 48 positions shown · IV contrast (gadavist)
Comparison: Head CT [DATE] and brain MRI [DATE]

CLINICAL DATA: Metastatic disease workup.  Lung cancer

EXAM:
MRI HEAD WITHOUT AND WITH CONTRAST
TECHNIQUE: Multiplanar, multiecho pulse sequences of the brain and surrounding
structures were obtained without and with intravenous contrast.
CONTRAST:  7mL GADAVIST GADOBUTROL 1 MMOL/ML IV SOLN

[Series 5: DWI · axial · 3.0mm · 1.36mm/px · z∈[-46,+89]mm · 6 of 96 slices shown (1 of 2)]
[im 1/96]
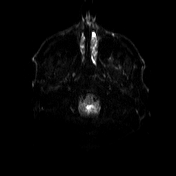
[im 20/96]
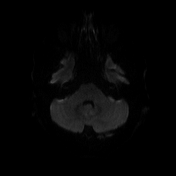
[im 39/96]
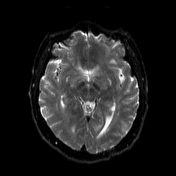
[im 58/96]
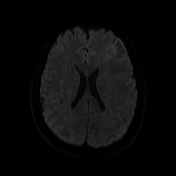
[im 77/96]
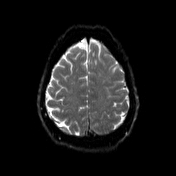
[im 96/96]
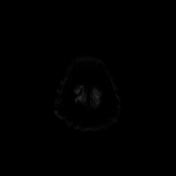

[Series 6: DWI · axial · 3.0mm · 1.36mm/px · z∈[-46,+89]mm · 3 of 47 slices shown (2 of 2)]
[im 1/47]
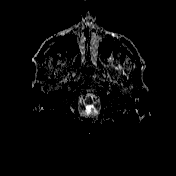
[im 24/47]
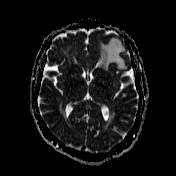
[im 47/47]
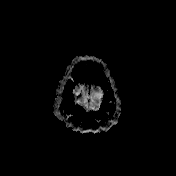

[Series 7: T1 · sagittal · 5.0mm · 0.75mm/px · 1 of 24 slices shown (1 of 4)]
[im 1/24]
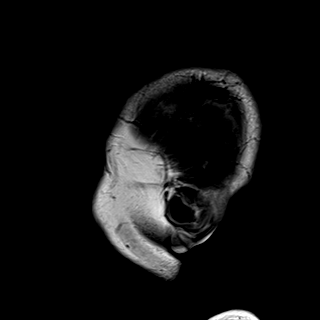

[Series 8: T2 · axial · 5.0mm · 0.62mm/px · 1 of 26 slices shown]
[im 1/26]
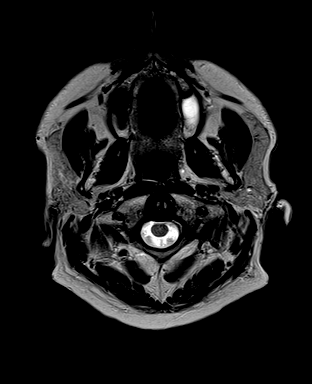

[Series 9: swi_images · axial · 3.0mm · 0.75mm/px · z∈[-80,+124]mm · 4 of 72 slices shown]
[im 1/72]
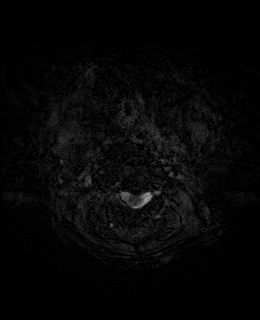
[im 24/72]
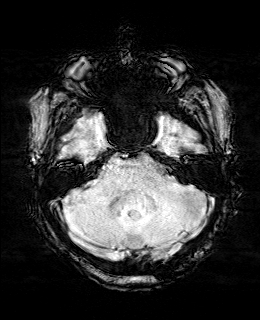
[im 48/72]
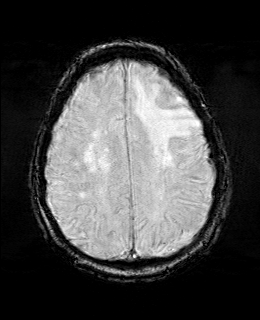
[im 72/72]
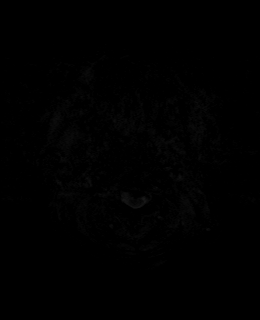

[Series 11: FLAIR · axial · 3.0mm · 0.75mm/px · z∈[-51,+95]mm · 3 of 52 slices shown]
[im 1/52]
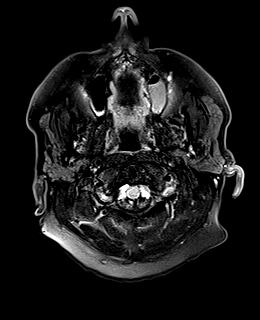
[im 26/52]
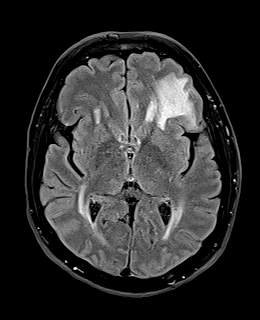
[im 52/52]
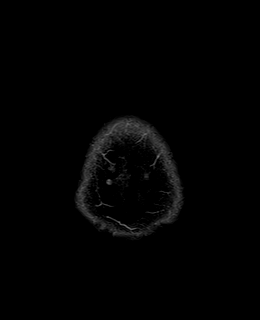

[Series 12: T1 · axial · 1.0mm · 0.94mm/px · z∈[-45,+92]mm · 8 of 144 slices shown (2 of 4)]
[im 1/144]
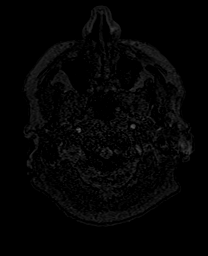
[im 21/144]
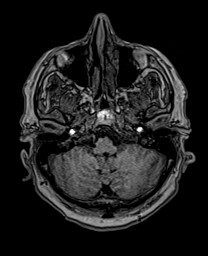
[im 41/144]
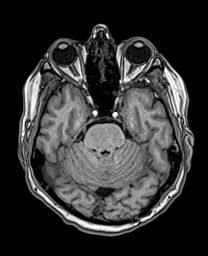
[im 62/144]
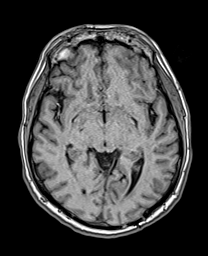
[im 82/144]
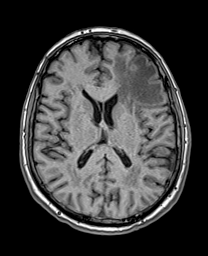
[im 103/144]
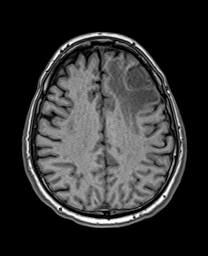
[im 123/144]
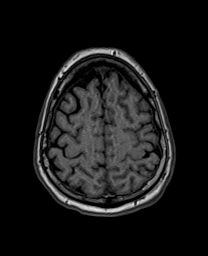
[im 144/144]
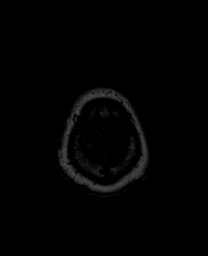

[Series 13: cor dwi_tracew · coronal · 5.0mm · 1.53mm/px · 3 of 60 slices shown]
[im 1/60]
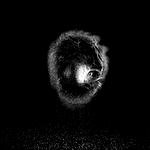
[im 30/60]
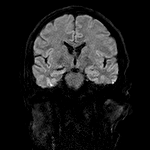
[im 60/60]
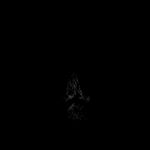

[Series 14: cor dwi_adc · coronal · 5.0mm · 1.53mm/px · 2 of 30 slices shown]
[im 1/30]
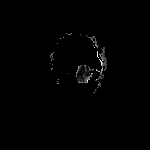
[im 30/30]
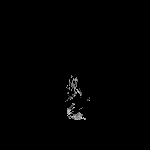

[Series 15: T2 post-contrast · coronal · 5.0mm · 0.57mm/px · 2 of 30 slices shown]
[im 1/30]
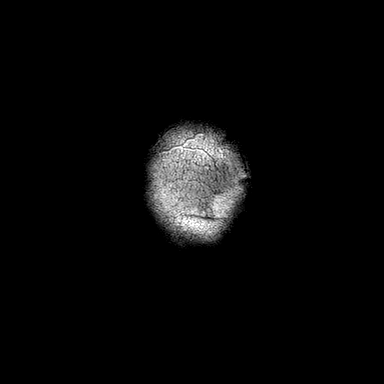
[im 30/30]
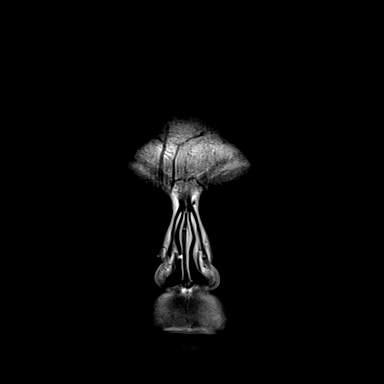

[Series 16: T1 post-contrast · axial · 1.0mm · 0.94mm/px · z∈[-45,+92]mm · 8 of 144 slices shown (1 of 3)]
[im 1/144]
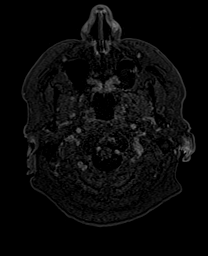
[im 21/144]
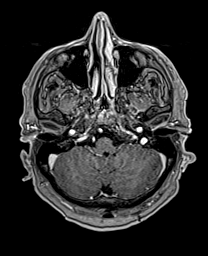
[im 41/144]
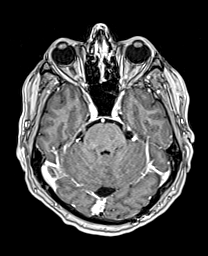
[im 62/144]
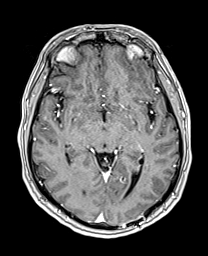
[im 82/144]
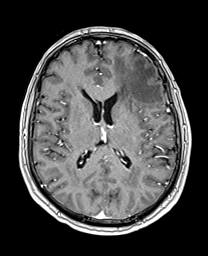
[im 103/144]
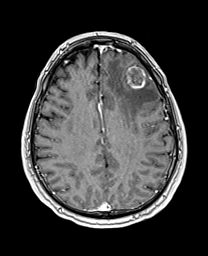
[im 123/144]
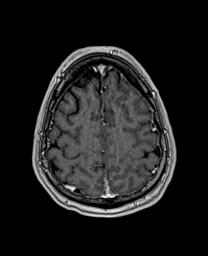
[im 144/144]
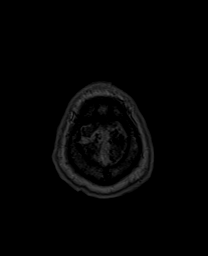

[Series 17: T1 · sagittal · 4.0mm · 0.94mm/px · 2 of 30 slices shown (3 of 4)]
[im 1/30]
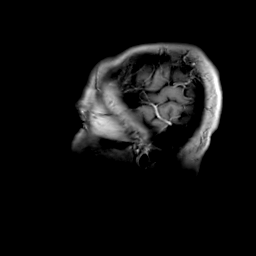
[im 30/30]
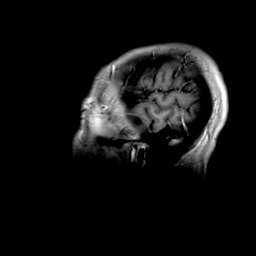

[Series 18: T1 · coronal · 4.0mm · 0.94mm/px · 2 of 30 slices shown (4 of 4)]
[im 1/30]
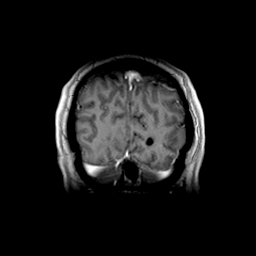
[im 30/30]
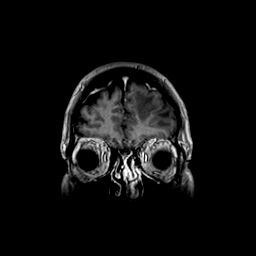

[Series 19: T1 post-contrast · coronal · 5.0mm · 0.43mm/px · 2 of 30 slices shown (2 of 3)]
[im 1/30]
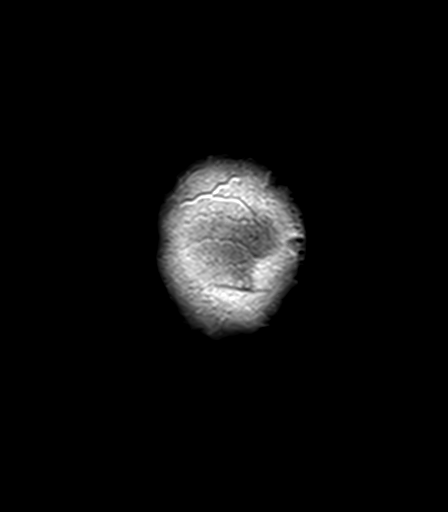
[im 30/30]
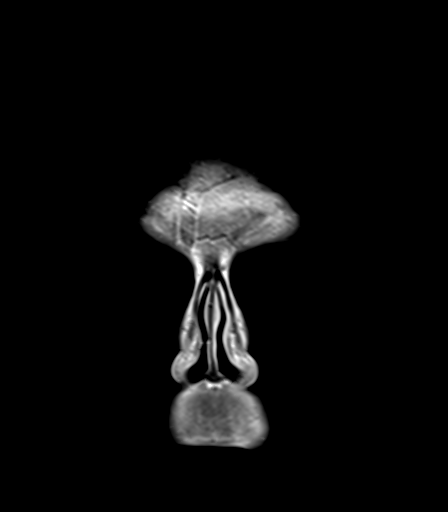

[Series 20: T1 post-contrast · sagittal · 5.0mm · 0.75mm/px · 1 of 24 slices shown (3 of 3)]
[im 1/24]
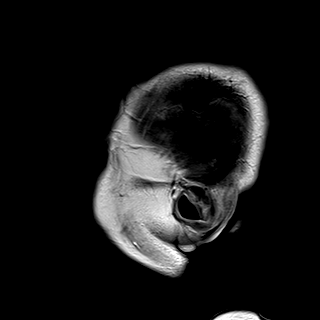

[48 of 48 positions shown; findings below may reference images not displayed]

FINDINGS: Brain: 11 mm metastasis in the vermis, slightly decreased in size
from prior. No progression of vasogenic edema and no local mass
effect.

Left occipital resection site shows progression of nodular marginal
enhancement from 6 mm to 9 mm.

Dominant change is enlarging, heterogeneously enhancing left frontal
mass which measures 2.6 cm with eccentric inferior peripheral
enhancement and progressive vasogenic edema.

No new metastasis noted.

No acute infarct, hemorrhage, hydrocephalus, or collection.

Vascular: Normal flow voids and vascular enhancements

Skull and upper cervical spine: Unremarkable left occipital
craniotomy site.

Sinuses/Orbits: No significant finding
IMPRESSION: 1. Since [DATE], enlarged left frontal lesion from 12 mm to 26
mm with progressive and extensive vasogenic edema, although no
worrisome mass effect. Notable eccentric inferior solid growth of
this necrotic lesion.
2. 9 mm nodule at the left occipital resection site with minimal
increase from before.
3. No progression of the vermian lesion and edema.
4. No new metastatic focus.

## 2021-06-08 MED ORDER — GADOBUTROL 1 MMOL/ML IV SOLN
7.0000 mL | Freq: Once | INTRAVENOUS | Status: AC | PRN
  Administered 2021-06-08: 7 mL via INTRAVENOUS

## 2021-06-08 MED ORDER — HYDROMORPHONE HCL 1 MG/ML IJ SOLN
1.0000 mg | Freq: Once | INTRAMUSCULAR | Status: AC
  Administered 2021-06-08: 1 mg via INTRAVENOUS
  Filled 2021-06-08: qty 1

## 2021-06-08 MED ORDER — DEXAMETHASONE SODIUM PHOSPHATE 10 MG/ML IJ SOLN
20.0000 mg | Freq: Once | INTRAMUSCULAR | Status: AC
  Administered 2021-06-08: 20 mg via INTRAVENOUS
  Filled 2021-06-08: qty 2

## 2021-06-08 MED ORDER — LACTATED RINGERS IV BOLUS
1000.0000 mL | Freq: Once | INTRAVENOUS | Status: AC
Start: 2021-06-08 — End: 2021-06-08
  Administered 2021-06-08: 1000 mL via INTRAVENOUS

## 2021-06-08 MED ORDER — DEXAMETHASONE 4 MG PO TABS: 4.0000 mg | ORAL_TABLET | Freq: Every day | ORAL | 0 refills | Status: DC

## 2021-06-08 MED ORDER — OMEPRAZOLE 20 MG PO CPDR: 20.0000 mg | DELAYED_RELEASE_CAPSULE | Freq: Every day | ORAL | 0 refills | Status: DC

## 2021-06-08 MED ORDER — ONDANSETRON HCL 4 MG/2ML IJ SOLN
4.0000 mg | Freq: Once | INTRAMUSCULAR | Status: AC
  Administered 2021-06-08: 4 mg via INTRAVENOUS
  Filled 2021-06-08: qty 2

## 2021-06-08 MED ORDER — HYDROMORPHONE HCL 1 MG/ML IJ SOLN
0.5000 mg | Freq: Once | INTRAMUSCULAR | Status: AC
  Administered 2021-06-08: 0.5 mg via INTRAVENOUS
  Filled 2021-06-08: qty 1

## 2021-06-08 MED ORDER — LORAZEPAM 2 MG/ML IJ SOLN
1.0000 mg | Freq: Once | INTRAMUSCULAR | Status: AC
  Administered 2021-06-08: 1 mg via INTRAVENOUS
  Filled 2021-06-08: qty 1

## 2021-06-08 NOTE — ED Notes (Signed)
EDP at bedside  

## 2021-06-08 NOTE — ED Notes (Signed)
Pt is requesting additional pain medication and Ativan prior to MRI.  Will notify EDP.  ?

## 2021-06-08 NOTE — ED Notes (Signed)
Patient transported to MRI 

## 2021-06-08 NOTE — Discharge Instructions (Addendum)
All the blood work today still looks normal.  However the swelling in the brain has gotten worse.  He will take the steroid Decadron 4 mg daily until you follow-up with the radiation oncologist.  Also discontinue taking Pravachol or pravastatin which is a cholesterol medication.  You are also given a prescription for a medication for your stomach called omeprazole.  You can take 2 Percocets as needed if one does not control the pain every 4-6 hours.  If you start having significant confusion, vomiting, severe headaches, difficulty walking or other concerns return to the emergency room immediately ?

## 2021-06-08 NOTE — ED Provider Notes (Signed)
[facility]ENC[facility]PT Provider Note   CSN: [id number] Arrival date & time: 06/08/21  4098     History  Chief Complaint  Patient presents with   Abdominal Pain   Back Pain   Hypertension    [name] is a 61 y.o. male.  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  He reports the pain initially was in his right flank and he was seen for this several times.  He had imaging done which showed constipation but no other acute findings except for some mild right-sided kidney swelling without known cause however reports that the pain is just gradually getting worse.  He reports he has been awake since 2:00 this morning due to diffuse abdominal discomfort.  Initially they were thinking that it was related to constipation however he has been on 20 g of lactulose 3 times a day since his appointment with Dr. Myna Hidalgo on the 15th.  He reports he is still not having very regular bowel movements but last had a normal bowel movement on Saturday.  His abdomen just hurts diffusely.  He reports the pain moves around but does not seem worse with eating.  He denies any urinary symptoms, nausea or vomiting.  He denies any shortness of breath.  However he also complains of a rash that is been present since December and is starting to get worse also since the beginning of the month.  He has seen dermatology for this rash but reports that it improved after getting a steroid shot but that was over a month ago and now he is itching all the time as well.  Other than getting back on Reglan and the lactulose he started no new medications.  He is following with radiation oncology for the mets in his brain.  However his wife reported this morning when they checked his blood pressure it was  190/100 and he does not have a history of hypertension.  He takes no medications for hypertension and this really concerned him.  He denies any chest pressure or discomfort.  Nothing seems to make the pain better or worse except Percocet does relieve it a little bit.  Having a bowel movement does not change it.  His wife reports he just has not been himself.  He is more lethargic and is not active like he used to be.  The history is provided by the patient, the spouse and medical records.  Abdominal Pain Back Pain Associated symptoms: abdominal pain   Hypertension Associated symptoms include abdominal pain.      Home Medications Prior to Admission medications   Medication Sig Start Date End Date Taking? Authorizing Provider  dexamethasone (DECADRON) 4 MG tablet Take 1 tablet (4 mg total) by mouth daily. 06/08/21  Yes Deion Swift, Alphonzo Lemmings, MD  omeprazole (PRILOSEC) 20 MG capsule Take 1 capsule (20 mg total) by mouth daily. 06/08/21  Yes Gwyneth Sprout, MD  ACCU-CHEK GUIDE test strip CHECK BLOOD SUGAR DAILY 11/20/20   [provider]  acetaminophen (TYLENOL) 325 MG tablet Take by mouth. 11/18/20   [provider]  albuterol (PROVENTIL) (2.5 MG/3ML) 0.083% nebulizer solution  01/25/21   [provider]  albuterol (VENTOLIN HFA) 108 (90 Base) MCG/ACT inhaler  06/11/20   [provider]  aspirin EC  81 MG tablet Take 81 mg by mouth daily.    [provider]  Bempedoic Acid (NEXLETOL) 180 MG TABS Take 1 tablet by mouth daily. 09/22/20   [provider]  Blood Glucose Monitoring Suppl (ACCU-CHEK GUIDE) w/Device KIT USE AS DIRECTED TO CHECK BLOOD SUGAR 11/20/20   [provider]  Blood Glucose Monitoring Suppl (GLUCOCOM BLOOD GLUCOSE MONITOR) DEVI Use to check blood sugar daily. 11/20/20   [provider]  budesonide-formoterol (SYMBICORT) 160-4.5 MCG/ACT inhaler Inhale 2 puffs into the lungs 2 (two) times daily.    [provider]   escitalopram (LEXAPRO) 5 MG tablet Take 5 mg by mouth daily.    [provider]  fluocinolone (VANOS) 0.01 % cream Apply topically. 03/18/20   [provider]  glucose blood (PRECISION QID TEST) test strip Use to check blood sugar daily. 11/20/20   [provider]  lactulose (CHRONULAC) 10 GM/15ML solution Take 30 mLs (20 g total) by mouth 3 (three) times daily. 06/02/21   Josph Macho, MD  Lancets MISC Use to check blood sugar daily. 11/20/20   [provider]  LORazepam (ATIVAN) 1 MG tablet Take 1 tablet 60 min prior to PET or MRI, and one more tablet 30 minutes prior to imaging if needed for anxiety. (Take 1- 2 tablets PRN anxiety prior to imaging). 04/06/21   Lonie Peak, MD  metFORMIN (GLUCOPHAGE) 500 MG tablet Take by mouth daily.    [provider]  metoCLOPramide (REGLAN) 10 MG tablet Take 2 tablets (20 mg total) by mouth 4 (four) times daily -  before meals and at bedtime. 06/02/21 06/02/22  Josph Macho, MD  montelukast (SINGULAIR) 10 MG tablet Take 10 mg by mouth at bedtime.    [provider]  Multiple Vitamin (MULTIVITAMIN) capsule Take 1 capsule by mouth daily.    [provider]  naloxone [facility]) nasal spray 4 mg/0.1 mL  12/05/20   [provider]  nitroGLYCERIN (NITROSTAT) 0.4 MG SL tablet  05/13/15   [provider]  oxyCODONE-acetaminophen (PERCOCET/ROXICET) 5-325 MG tablet Take 1 tablet by mouth every 8 (eight) hours as needed for severe pain. 06/02/21   Josph Macho, MD  pentoxifylline (TRENTAL) 400 MG CR tablet Take 400mg  tab daily x 1 week, then 400mg  BID; take with food 04/06/21   Lonie Peak, MD  pravastatin (PRAVACHOL) 80 MG tablet Take 80 mg by mouth daily.    [provider]  promethazine (PHENERGAN) 25 MG suppository Place 1 suppository (25 mg total) rectally every 6 (six) hours as needed for nausea or vomiting. Patient not taking: Reported on 01/27/2021 07/12/18   Terrilee Files, MD  senna-docusate (SENOKOT-S) 8.6-50 MG tablet Take 2 tablets by mouth daily. 05/24/21   [provider]  vitamin E 180 MG (400 UNITS) capsule Take 400 IU daily x 1 week, then 400 IU BID 04/06/21   Lonie Peak, MD      Allergies    Gabapentin, Levetiracetam, Pregabalin, Bupropion, Duloxetine, Ezetimibe, Other, Prochlorperazine, and Lamotrigine    Review of Systems   Review of Systems  Gastrointestinal:  Positive for abdominal pain.  Musculoskeletal:  Positive for back pain.   Physical Exam Updated Vital Signs BP 127/89   Pulse 71   Temp 97.9 F (36.6 C) (Oral)   Resp 18   Ht 6' 0.5" (1.842 m)   Wt 76.7 kg   SpO2 98%   BMI 22.61 kg/m  Physical Exam Vitals and nursing note reviewed.  Constitutional:      General: He is not in acute distress.    Appearance: He is well-developed and normal weight.  HENT:     Head: Normocephalic and atraumatic.     Mouth/Throat:     Mouth: Mucous membranes are dry.  Eyes:     Conjunctiva/sclera: Conjunctivae normal.     Pupils: Pupils are equal, round, and reactive to light.  Cardiovascular:     Rate and Rhythm: Normal rate and regular rhythm.     Heart sounds: No murmur heard. Pulmonary:     Effort: Pulmonary effort is normal. No respiratory distress.     Breath sounds: Normal breath sounds. No wheezing or rales.  Abdominal:     General: There is no distension.     Palpations: Abdomen is soft.     Tenderness: There is no abdominal tenderness. There is no right CVA tenderness, left CVA tenderness, guarding or rebound.     Comments: Abdomen is soft with no hepatosplenomegaly and no reproducible pain in any specific location  Musculoskeletal:        General: No tenderness. Normal range of motion.     Cervical back: Normal range of motion and neck supple.     Right lower leg: No edema.     Left lower leg: No edema.  Skin:    General: Skin is warm and dry.     Findings: Rash present. No erythema.     Comments:  Lesions noted over the torso and lower back which are small, mildly erythematous and excoriated some with scabs.  No vesicles present, no surrounding erythema, induration or fluctuance.  No pustules  Neurological:     Mental Status: He is alert and oriented to person, place, and time. Mental status is at baseline.     Sensory: No sensory deficit.     Motor: No weakness.     Gait: Gait normal.  Psychiatric:        Mood and Affect: Affect is flat.        Behavior: Behavior normal.    ED Results / Procedures / Treatments   Labs (all labs ordered are listed, but only abnormal results are displayed) Labs Reviewed  LIPASE, BLOOD - Abnormal; Notable for the following components:      Result Value   Lipase 138 (*)    All other components within normal limits  COMPREHENSIVE METABOLIC PANEL - Abnormal; Notable for the following components:   Glucose, Bld 127 (*)    All other components within normal limits  CBC - Abnormal; Notable for the following components:   RDW 16.3 (*)    All other components within normal limits  URINALYSIS, ROUTINE W REFLEX MICROSCOPIC  TROPONIN I (HIGH SENSITIVITY)    EKG EKG Interpretation  Date/Time:  Tuesday June 08 2021 09:06:18 EDT Ventricular Rate:  73 PR Interval:  157 QRS Duration: 136 QT Interval:  402 QTC Calculation: 443 R Axis:   66 Text Interpretation: Sinus rhythm Right bundle branch block No significant change since last tracing Confirmed by Gwyneth Sprout ([id number]) on 06/08/2021 9:46:56 AM  Radiology MR Brain W and Wo Contrast  Result Date: 06/08/2021 CLINICAL DATA:  Metastatic disease workup.  Lung cancer EXAM: MRI HEAD WITHOUT AND WITH CONTRAST TECHNIQUE: Multiplanar, multiecho pulse sequences of the brain and surrounding structures were obtained without and with intravenous contrast. CONTRAST:  7mL GADAVIST GADOBUTROL 1 MMOL/ML IV SOLN COMPARISON:  Head CT 05/27/2021 and brain MRI 04/01/2021 FINDINGS: Brain: 11 mm metastasis in the  vermis, slightly decreased in size from prior. No progression of vasogenic edema and no local mass effect. Left occipital resection site shows progression of nodular marginal enhancement from 6 mm to 9 mm. Dominant change is enlarging, heterogeneously enhancing left frontal mass which measures 2.6 cm with eccentric inferior peripheral enhancement and progressive vasogenic edema. No new metastasis noted. No acute infarct, hemorrhage, hydrocephalus, or collection. Vascular: Normal flow voids and vascular enhancements Skull and upper cervical spine: Unremarkable left occipital craniotomy site. Sinuses/Orbits: No significant finding IMPRESSION: 1. Since 04/01/2021, enlarged left frontal lesion from 12 mm to 26 mm with progressive and extensive vasogenic edema, although no worrisome mass effect. Notable eccentric inferior solid growth of this necrotic lesion. 2. 9 mm nodule at the left occipital resection site with minimal increase from before. 3. No progression of the vermian lesion and edema. 4. No new metastatic focus. Electronically Signed   By: Tiburcio Pea M.D.   On: 06/08/2021 12:01    Procedures Procedures    Medications Ordered in ED Medications  lactated ringers bolus 1,000 mL (0 mLs Intravenous Stopped 06/08/21 1105)  HYDROmorphone (DILAUDID) injection 0.5 mg (0.5 mg Intravenous Given 06/08/21 0942)  ondansetron (ZOFRAN) injection 4 mg (4 mg Intravenous Given 06/08/21 0941)  HYDROmorphone (DILAUDID) injection 1 mg (1 mg Intravenous Given 06/08/21 1108)  LORazepam (ATIVAN) injection 1 mg (1 mg Intravenous Given 06/08/21 1108)  gadobutrol (GADAVIST) 1 MMOL/ML injection 7 mL (7 mLs Intravenous Contrast Given 06/08/21 1149)  dexamethasone (DECADRON) injection 20 mg (20 mg Intravenous Given 06/08/21 1255)    ED Course/ Medical Decision Making/ A&P                           Medical Decision Making Amount and/or Complexity of Data Reviewed Independent Historian: spouse External Data Reviewed:  notes. Labs: ordered. Decision-making details documented in ED Course. Radiology: ordered and independent interpretation performed. Decision-making details documented in ED Course. ECG/medicine tests: ordered and independent interpretation performed. Decision-making details documented in ED Course.  Risk Prescription drug management.   Patient presenting today with complaint of nonspecific abdominal pain.  This is now been occurring for 3 weeks and per he and his wife it is getting worse.  He has been seen in the emergency room and his external medical records from his oncologist were reviewed also where he was describing pain.  He has been placed on lactulose and Reglan to see if improving his constipation will improve the pain however it has not.  His wife reports he still eating well and denies any infectious symptoms.  Patient has had imaging of his spine, chest abdomen and pelvis without specific findings except for some mild swelling of the right kidney of unknown significance.  He is awaiting to receive another PET scan to see if that will explain why he is having pain.  He has no localized abdominal pain or reproducible abdominal pain on exam today.  He is hypertensive here at 150/94 without prior history of hypertension and recent doctor's visit with normal blood pressure.  Could be related to pain however patient did have some worsening swelling in his vasogenic edema at the beginning of the month which could also be factoring in.  Patient has no focal neurologic findings on exam.  He is awake and alert.  No evidence of delirium or stroke.  However will ensure no findings of atypical ACS, new infectious etiology but if these are normal we will repeat MRI of the  brain as he has one scheduled on the 24th of this month to ensure no worsening of the above.  2:22 PM I independently interpreted patient's labs and EKG today.  Patient's EKG is unchanged, labs with unchanged lipase of 138, CMP without  acute findings, CBC within normal limits, troponin within normal limits.  Based on patient's hypertension, no new findings for the abdomen and MR brain with and without contrast was ordered.  I independently visualized patient's brain MRI.  Radiology reports enlarged left frontal lesion from 12 mm to 26 mm with progressive and extensive vasogenic edema but no evidence of mass effect.  Also a minimal increase in left occipital resection site.  Findings were discussed with Dr. Myna Hidalgo patient's oncologist who he recommended giving 20 mg of Decadron and then discharging home with 4 mg twice daily.  However Dr. Barbaraann Cao patient's neuro oncologist recommended 4 mg daily.  Dr. Myna Hidalgo also recommended stopping the Pravachol and starting him on a PPI.  On reevaluation patient is still awake and alert.  He displays no symptoms that would suggest that he needs admission today.  Patient's wife is present and findings were discussed with them as well as the plan.  Reached out to Dr. Basilio Cairo if patient needs a sooner appointment before next week.  The office will reach out to the patient but they are aware of his new imaging findings.  Patient was given strict return precautions.  He was instructed on how to use pain medication.  He was discharged home with his wife.  Initially had ordered Prilosec but patient's wife reports he is currently taking Protonix.  So he will continue the Protonix.        Final Clinical Impression(s) / ED Diagnoses Final diagnoses:  Vasogenic brain edema (HCC)    Rx / DC Orders ED Discharge Orders          Ordered    dexamethasone (DECADRON) 4 MG tablet  Daily        06/08/21 1355    omeprazole (PRILOSEC) 20 MG capsule  Daily        06/08/21 1355              Gwyneth Sprout, MD 06/08/21 1422

## 2021-06-08 NOTE — ED Triage Notes (Addendum)
Patient c/o abdominal pain, back pain R>L, and hypertension x 3-4 weeks. ? ?Patient denies any N/V/D. ?

## 2021-06-09 ENCOUNTER — Telehealth: Payer: Self-pay | Admitting: Radiation Therapy

## 2021-06-09 NOTE — Telephone Encounter (Signed)
Spoke with Mrs. Kassab about her husband keeping the 3T Brain MRI as scheduled on Friday. This is a more detailed scan using a higher power magnet and smaller slice cuts for more information and better brain definition.  ? ?We have also moved Mr. Emma follow-up with Dr. Isidore Moos to Monday with a SIM penciled in afterwards in case his providers would like to offer treatment. We have the old mask from his previous treatment that we will verify still fits, if necessary.  ? ?I explained a possibility of surgical intervention, with or without radiation. These are just possible scenarios, but ultimately the treatment course will be discussed and decided during the 3/27 brain and spine conference. Mrs. Briney was thankful for the call.  ? ? ?Mont Dutton R.T.(R)(T) ?Radiation Special Procedures Navigator  ?

## 2021-06-11 ENCOUNTER — Other Ambulatory Visit (HOSPITAL_BASED_OUTPATIENT_CLINIC_OR_DEPARTMENT_OTHER): Payer: Self-pay

## 2021-06-11 ENCOUNTER — Telehealth: Payer: Self-pay | Admitting: *Deleted

## 2021-06-11 ENCOUNTER — Inpatient Hospital Stay (HOSPITAL_BASED_OUTPATIENT_CLINIC_OR_DEPARTMENT_OTHER): Payer: Medicare HMO | Admitting: Hematology & Oncology

## 2021-06-11 ENCOUNTER — Telehealth: Payer: Self-pay | Admitting: Radiation Therapy

## 2021-06-11 ENCOUNTER — Other Ambulatory Visit: Payer: Self-pay | Admitting: Radiation Therapy

## 2021-06-11 ENCOUNTER — Encounter: Payer: Self-pay | Admitting: Hematology & Oncology

## 2021-06-11 ENCOUNTER — Other Ambulatory Visit: Payer: Medicare HMO

## 2021-06-11 ENCOUNTER — Inpatient Hospital Stay: Payer: Medicare HMO

## 2021-06-11 ENCOUNTER — Other Ambulatory Visit: Payer: Self-pay

## 2021-06-11 ENCOUNTER — Other Ambulatory Visit: Payer: Self-pay | Admitting: *Deleted

## 2021-06-11 VITALS — BP 117/77 | HR 94 | Temp 97.5°F | Resp 20 | Wt 169.0 lb

## 2021-06-11 VITALS — BP 151/86 | HR 64 | Resp 17

## 2021-06-11 DIAGNOSIS — C3490 Malignant neoplasm of unspecified part of unspecified bronchus or lung: Secondary | ICD-10-CM | POA: Diagnosis not present

## 2021-06-11 DIAGNOSIS — C7931 Secondary malignant neoplasm of brain: Secondary | ICD-10-CM | POA: Diagnosis not present

## 2021-06-11 DIAGNOSIS — C3492 Malignant neoplasm of unspecified part of left bronchus or lung: Secondary | ICD-10-CM | POA: Diagnosis not present

## 2021-06-11 MED ORDER — FAMOTIDINE IN NACL 20-0.9 MG/50ML-% IV SOLN: 40.0000 mg | Freq: Once | INTRAVENOUS | Status: DC

## 2021-06-11 MED ORDER — SODIUM CHLORIDE 0.9 % IV SOLN
40.0000 mg | Freq: Once | INTRAVENOUS | Status: AC
  Administered 2021-06-11: 40 mg via INTRAVENOUS
  Filled 2021-06-11: qty 4

## 2021-06-11 MED ORDER — FLUCONAZOLE 100 MG PO TABS: 100.0000 mg | ORAL_TABLET | Freq: Every day | ORAL | 4 refills | Status: DC

## 2021-06-11 MED ORDER — SODIUM CHLORIDE 0.9 % IV SOLN: INTRAVENOUS | Status: DC

## 2021-06-11 MED ORDER — HYDROMORPHONE HCL 1 MG/ML IJ SOLN
2.0000 mg | Freq: Once | INTRAMUSCULAR | Status: DC
  Filled 2021-06-11: qty 2

## 2021-06-11 MED ORDER — FENTANYL 25 MCG/HR TD PT72
1.0000 | MEDICATED_PATCH | TRANSDERMAL | 0 refills | Status: DC
  Filled 2021-06-11: qty 10, 30d supply, fill #0

## 2021-06-11 MED ORDER — FENTANYL 25 MCG/HR TD PT72
1.0000 | MEDICATED_PATCH | TRANSDERMAL | 0 refills | Status: DC
Start: 2021-06-11 — End: 2021-06-11

## 2021-06-11 MED ORDER — HYDROMORPHONE HCL 1 MG/ML IJ SOLN
2.0000 mg | Freq: Once | INTRAMUSCULAR | Status: AC
  Administered 2021-06-11: 2 mg via INTRAVENOUS

## 2021-06-11 MED ORDER — SODIUM CHLORIDE 0.9 % IV SOLN: INTRAVENOUS | Status: AC

## 2021-06-11 NOTE — Patient Instructions (Signed)

## 2021-06-11 NOTE — Telephone Encounter (Signed)
Call received from patient's wife stating that [facility] does not have Fentanyl 25 mcg patches available and states that it is on national back order.  Informed pt that I would contact [facility] to see if Fentanyl 25 mcg patches are available there.  Call placed to [facility] and Fentanyl 25 mcg patches are available.  Prescription canceled at [facility] and resent to [facility] per [name].  Pt.'s wife notified of above and will come to pick up patches now.    ?

## 2021-06-11 NOTE — Telephone Encounter (Signed)
I have rescheduled the brain MRI and spoke with Ms. Marcil this morning. The patient's main complaint is not anything CNS related, he is in tremendous pain (rt side and back). The cause of this pain has not been identified and his blood pressure has been very high due to the pain. Ms. Frayre is very frustrated and concerned because they do not know what is causing it, therefore cannot help him. She really wants to know what she can do to make him more comfortable, but states when they go to the ED, they will work to lower his blood pressure then just sent him home. ? ?Mont Dutton R.T.(R)(T) ?Radiation Special Procedures Navigator  ?

## 2021-06-14 ENCOUNTER — Other Ambulatory Visit: Payer: Self-pay

## 2021-06-14 ENCOUNTER — Encounter: Payer: Self-pay | Admitting: Radiation Oncology

## 2021-06-14 ENCOUNTER — Inpatient Hospital Stay: Payer: Medicare HMO

## 2021-06-14 ENCOUNTER — Ambulatory Visit
Admission: RE | Admit: 2021-06-14 | Discharge: 2021-06-14 | Disposition: A | Discharge status: Home / Self Care | Payer: Medicare HMO | Source: Ambulatory Visit | Attending: Radiation Oncology | Admitting: Radiation Oncology

## 2021-06-14 VITALS — BP 142/85 | HR 52 | Temp 96.5°F | Resp 18 | Ht 72.5 in | Wt 169.5 lb

## 2021-06-14 DIAGNOSIS — M48061 Spinal stenosis, lumbar region without neurogenic claudication: Secondary | ICD-10-CM | POA: Insufficient documentation

## 2021-06-14 DIAGNOSIS — Z7982 Long term (current) use of aspirin: Secondary | ICD-10-CM | POA: Insufficient documentation

## 2021-06-14 DIAGNOSIS — R109 Unspecified abdominal pain: Secondary | ICD-10-CM | POA: Insufficient documentation

## 2021-06-14 DIAGNOSIS — C3411 Malignant neoplasm of upper lobe, right bronchus or lung: Secondary | ICD-10-CM | POA: Insufficient documentation

## 2021-06-14 DIAGNOSIS — K5730 Diverticulosis of large intestine without perforation or abscess without bleeding: Secondary | ICD-10-CM | POA: Insufficient documentation

## 2021-06-14 DIAGNOSIS — J323 Chronic sphenoidal sinusitis: Secondary | ICD-10-CM | POA: Insufficient documentation

## 2021-06-14 DIAGNOSIS — J439 Emphysema, unspecified: Secondary | ICD-10-CM | POA: Insufficient documentation

## 2021-06-14 DIAGNOSIS — C7931 Secondary malignant neoplasm of brain: Secondary | ICD-10-CM

## 2021-06-14 DIAGNOSIS — I700 Atherosclerosis of aorta: Secondary | ICD-10-CM | POA: Insufficient documentation

## 2021-06-14 DIAGNOSIS — J322 Chronic ethmoidal sinusitis: Secondary | ICD-10-CM | POA: Insufficient documentation

## 2021-06-14 DIAGNOSIS — Z79899 Other long term (current) drug therapy: Secondary | ICD-10-CM | POA: Insufficient documentation

## 2021-06-14 DIAGNOSIS — Z7984 Long term (current) use of oral hypoglycemic drugs: Secondary | ICD-10-CM | POA: Insufficient documentation

## 2021-06-14 DIAGNOSIS — C3492 Malignant neoplasm of unspecified part of left bronchus or lung: Secondary | ICD-10-CM | POA: Diagnosis not present

## 2021-06-14 NOTE — Progress Notes (Signed)
?Radiation Oncology         [phone] ?________________________________ ? ?Name: Bill Garza MRN: [id number]  ?Date: 06/14/2021  DOB: 06/21/1960 ? ?Follow-Up Visit Note ? ?Outpatient ? ?CC: Aleen Campi, NP  [name], MD ? ?Diagnosis and Prior Radiotherapy:  ?  ICD-10-CM   ?1. Brain metastases ([place])  C79.31   ?  ? ? ?Radiation Treatment Dates: 04/13/2021 through 04/13/2021 ?Site Technique Total Dose (Gy) Dose per Fx (Gy) Completed Fx Beam Energies  ?Brain: Brain_SRS 3D 20/20 20 1/1 6XFFF  ? ?The new cerebellar vermis 68m metastasis was treated. ? ? ?[facility] ?On 12/25/20 Gamma knife was performed - "L frontal" (20Gy to 75% isodose line - two adjacent lesions targeted); "Cavity" (16 Gy to 55% isodose line - left occipital surgical cavity targeted).   ? ?CHIEF COMPLAINT: Right flank pain ? ?Narrative:  The patient returns today for routine follow-up.    His main concern is the pain that he has been experiencing in the right flank.  This prompted him to go to the emergency room recently.  At that time systemic imaging did not reveal an obvious cause of his flank pain.  He also underwent a brain MRI which was reviewed at tumor board this morning.  It shows progressive mass in left frontal lobe at previous gamma knife radiosurgery site.  Other sites of previously treated disease are under control. He recently started a Duragesic patch for the flank pain under the care of Dr. EMarin Olp  PET scan is pending.  He will continue to follow with Dr. EMarin Olpto work-up this pain.        ? ?ALLERGIES:  is allergic to gabapentin, levetiracetam, pregabalin, bupropion, duloxetine, ezetimibe, other, prochlorperazine, and lamotrigine. ? ?Meds: ?Current Outpatient Medications  ?Medication Sig Dispense Refill  ? ACCU-CHEK GUIDE test strip CHECK BLOOD SUGAR DAILY    ? acetaminophen (TYLENOL) 325 MG tablet Take by mouth.    ? albuterol (PROVENTIL) (2.5 MG/3ML) 0.083% nebulizer solution     ? albuterol  (VENTOLIN HFA) 108 (90 Base) MCG/ACT inhaler     ? aspirin EC 81 MG tablet Take 81 mg by mouth daily.    ? Bempedoic Acid (NEXLETOL) 180 MG TABS Take 1 tablet by mouth daily.    ? Blood Glucose Monitoring Suppl (ACCU-CHEK GUIDE) w/Device KIT USE AS DIRECTED TO CHECK BLOOD SUGAR    ? Blood Glucose Monitoring Suppl (GLUCOCOM BLOOD GLUCOSE MONITOR) DEVI Use to check blood sugar daily.    ? budesonide-formoterol (SYMBICORT) 160-4.5 MCG/ACT inhaler Inhale 2 puffs into the lungs 2 (two) times daily.    ? dexamethasone (DECADRON) 4 MG tablet Take 1 tablet (4 mg total) by mouth daily. 15 tablet 0  ? escitalopram (LEXAPRO) 5 MG tablet Take 5 mg by mouth daily.    ? fentaNYL (DURAGESIC) 25 MCG/HR Place 1 patch onto the skin every 3 (three) days. 10 patch 0  ? fluconazole (DIFLUCAN) 100 MG tablet Take 1 tablet (100 mg total) by mouth daily. Take 2 pills daily for 3 days then take 1 pill a day 30 tablet 4  ? fluocinolone (VANOS) 0.01 % cream Apply topically.    ? glucose blood (PRECISION QID TEST) test strip Use to check blood sugar daily.    ? lactulose (CHRONULAC) 10 GM/15ML solution Take 30 mLs (20 g total) by mouth 3 (three) times daily. 1000 mL 4  ? Lancets MISC Use to check blood sugar daily.    ? LORazepam (  ATIVAN) 1 MG tablet Take 1 tablet 60 min prior to PET or MRI, and one more tablet 30 minutes prior to imaging if needed for anxiety. (Take 1- 2 tablets PRN anxiety prior to imaging). 8 tablet 0  ? metFORMIN (GLUCOPHAGE) 500 MG tablet Take by mouth daily.    ? metoCLOPramide (REGLAN) 10 MG tablet Take 2 tablets (20 mg total) by mouth 4 (four) times daily -  before meals and at bedtime. 120 tablet 4  ? montelukast (SINGULAIR) 10 MG tablet Take 10 mg by mouth at bedtime.    ? Multiple Vitamin (MULTIVITAMIN) capsule Take 1 capsule by mouth daily.    ? naloxone (NARCAN) nasal spray 4 mg/0.1 mL  (Patient not taking: Reported on 06/02/2021)    ? nitroGLYCERIN (NITROSTAT) 0.4 MG SL tablet  (Patient not taking: Reported on  06/02/2021)    ? oxyCODONE-acetaminophen (PERCOCET/ROXICET) 5-325 MG tablet Take 1 tablet by mouth every 8 (eight) hours as needed for severe pain. 90 tablet 0  ? pentoxifylline (TRENTAL) 400 MG CR tablet Take 422m tab daily x 1 week, then 4036mBID; take with food 60 tablet 5  ? promethazine (PHENERGAN) 25 MG suppository Place 1 suppository (25 mg total) rectally every 6 (six) hours as needed for nausea or vomiting. (Patient not taking: Reported on 01/27/2021) 12 each 0  ? senna-docusate (SENOKOT-S) 8.6-50 MG tablet Take 2 tablets by mouth daily.    ? vitamin E 180 MG (400 UNITS) capsule Take 400 IU daily x 1 week, then 400 IU BID 60 capsule 5  ? ?No current facility-administered medications for this encounter.  ? ? ?Physical Findings: ?The patient is in no acute distress. Patient is alert and oriented. ? height is 6' 0.5" (1.842 m) and weight is 169 lb 8 oz (76.9 kg). His temporal temperature is 96.5 ?F (35.8 ?C) (abnormal). His blood pressure is 142/85 (abnormal) and his pulse is 52 (abnormal). His respiration is 18 and oxygen saturation is 99%. . Marland Kitchen  ?General: Alert and oriented, in no acute distress ?Musculoskeletal: symmetric strength and muscle tone throughout.  No tenderness to palpation throughout the right flank or the thoracic /lumbar spine ?Neurologic: Cranial nerves II through XII are grossly intact. No obvious focalities. Speech is fluent. Coordination is intact.  Finger-to-nose testing is intact.  He is ambulatory. ?Psychiatric: Judgment and insight are intact. Affect is appropriate. ? ?KPS = 80 ? ?100 - Normal; no complaints; no evidence of disease. ?90   - Able to carry on normal activity; minor signs or symptoms of disease. ?80   - Normal activity with effort; some signs or symptoms of disease. ?7056 - Cares for self; unable to carry on normal activity or to do active work. ?60   - Requires occasional assistance, but is able to care for most of his personal needs. ?50   - Requires considerable  assistance and frequent medical care. ?4071 - Disabled; requires special care and assistance. ?30   - Severely disabled; hospital admission is indicated although death not imminent. ?20   - Very sick; hospital admission necessary; active supportive treatment necessary. ?10   - Moribund; fatal processes progressing rapidly. ?0     - Dead ? ?Karnofsky DA, Abelmann WH, Craver LS and Burchenal JHCentral Oregon Surgery Center [phone] use of the nitrogen mustards in the palliative treatment of carcinoma: with particular reference to bronchogenic carcinoma Cancer 1 [phone] ? ? ? ?Lab Findings: ?Lab Results  ?Component Value Date  ? WBC 6.9 06/08/2021  ?  HGB 13.4 06/08/2021  ? HCT 41.2 06/08/2021  ? MCV 92.0 06/08/2021  ? PLT 364 06/08/2021  ? ? ?Radiographic Findings: ?CT Head Wo Contrast ? ?Result Date: 05/27/2021 ?CLINICAL DATA:  Headache. Right flank pain. History of non-small cell lung cancer metastatic to brain. EXAM: CT HEAD WITHOUT CONTRAST TECHNIQUE: Contiguous axial images were obtained from the base of the skull through the vertex without intravenous contrast. RADIATION DOSE REDUCTION: This exam was performed according to the departmental dose-optimization program which includes automated exposure control, adjustment of the mA and/or kV according to patient size and/or use of iterative reconstruction technique. COMPARISON:  MRI brain 04/01/2021 FINDINGS: Brain: There is relatively low conspicuity of the known metastatic lesion of the cerebellar vermis possibly due to the lack of IV contrast on today's examination. There is abnormal hypodensity in the left frontal lobe white matter favoring vasogenic edema and possibly associated with the previously seen enhancing left frontal lobe metastatic lesion shown on the MRI from 04/01/2021. Postoperative findings in the left occipital lobe noted with overlying craniotomy site. Periventricular white matter and corona radiata hypodensities favor chronic ischemic microvascular white matter disease. The  brainstem and cerebral peduncles appear unremarkable as do the basal ganglia. No midline shift or observed intracranial hemorrhage. No acute CVA is identified. Vascular: There is atherosclerotic calcification o

## 2021-06-14 NOTE — Progress Notes (Signed)
?Hematology and Oncology Follow Up Visit ? ?Bill Garza ?[id number] ?06-Jun-1960 61 y.o. ?06/14/2021 ? ? ?Principle Diagnosis:  ?Metastatic adenocarcinoma of the lung-CNS metastasis ? ?Current Therapy:   ?Status post radiosurgery-  SBRT given on 04/13/2021 ?    ?Interim History:  Bill Garza is back for an unscheduled follow-up.  Apparently, has been having a lot of abdominal issues.  He does not have a lot of abdominal pain.  Is hard to say why he has had this.  He has been to the emergency room for this.  He has been scanned.  Is been no obvious etiology on the scans. ? ?He is going to have a PET scan to see if this might be able to help with the etiology. ? ?He does have the possibility of progressive CNS disease.  Suppose that it is possible that the pain might be referred pain from the brain. ? ?I just hate to see him miserable.  This is all about quality of life.  I think he is clearly going to need some type of long-term pain control. ? ?I think a Duragesic patch would be a reasonable option for him.  I probably put him on a Duragesic patch 25 mcg. ? ?He has had no cough.  He has had no diarrhea.  He may have a little bit of constipation. ? ?Is been no rashes.  He has had no bleeding.  Is been no leg swelling. ? ?Overall, I would say his performance status is probably ECOG 1.   ? ?Medications:  ?Current Outpatient Medications:  ?  fluconazole (DIFLUCAN) 100 MG tablet, Take 1 tablet (100 mg total) by mouth daily. Take 2 pills daily for 3 days then take 1 pill a day, Disp: 30 tablet, Rfl: 4 ?  ACCU-CHEK GUIDE test strip, CHECK BLOOD SUGAR DAILY, Disp: , Rfl:  ?  acetaminophen (TYLENOL) 325 MG tablet, Take by mouth., Disp: , Rfl:  ?  albuterol (PROVENTIL) (2.5 MG/3ML) 0.083% nebulizer solution, , Disp: , Rfl:  ?  albuterol (VENTOLIN HFA) 108 (90 Base) MCG/ACT inhaler, , Disp: , Rfl:  ?  aspirin EC 81 MG tablet, Take 81 mg by mouth daily., Disp: , Rfl:  ?  Bempedoic Acid (NEXLETOL) 180 MG TABS, Take 1 tablet by mouth  daily., Disp: , Rfl:  ?  Blood Glucose Monitoring Suppl (ACCU-CHEK GUIDE) w/Device KIT, USE AS DIRECTED TO CHECK BLOOD SUGAR, Disp: , Rfl:  ?  Blood Glucose Monitoring Suppl (GLUCOCOM BLOOD GLUCOSE MONITOR) DEVI, Use to check blood sugar daily., Disp: , Rfl:  ?  budesonide-formoterol (SYMBICORT) 160-4.5 MCG/ACT inhaler, Inhale 2 puffs into the lungs 2 (two) times daily., Disp: , Rfl:  ?  dexamethasone (DECADRON) 4 MG tablet, Take 1 tablet (4 mg total) by mouth daily., Disp: 15 tablet, Rfl: 0 ?  escitalopram (LEXAPRO) 5 MG tablet, Take 5 mg by mouth daily., Disp: , Rfl:  ?  fentaNYL (DURAGESIC) 25 MCG/HR, Place 1 patch onto the skin every 3 (three) days., Disp: 10 patch, Rfl: 0 ?  fluocinolone (VANOS) 0.01 % cream, Apply topically., Disp: , Rfl:  ?  glucose blood (PRECISION QID TEST) test strip, Use to check blood sugar daily., Disp: , Rfl:  ?  lactulose (CHRONULAC) 10 GM/15ML solution, Take 30 mLs (20 g total) by mouth 3 (three) times daily., Disp: 1000 mL, Rfl: 4 ?  Lancets MISC, Use to check blood sugar daily., Disp: , Rfl:  ?  LORazepam (ATIVAN) 1 MG tablet, Take 1 tablet 60 min prior  to PET or MRI, and one more tablet 30 minutes prior to imaging if needed for anxiety. (Take 1- 2 tablets PRN anxiety prior to imaging)., Disp: 8 tablet, Rfl: 0 ?  metFORMIN (GLUCOPHAGE) 500 MG tablet, Take by mouth daily., Disp: , Rfl:  ?  metoCLOPramide (REGLAN) 10 MG tablet, Take 2 tablets (20 mg total) by mouth 4 (four) times daily -  before meals and at bedtime., Disp: 120 tablet, Rfl: 4 ?  montelukast (SINGULAIR) 10 MG tablet, Take 10 mg by mouth at bedtime., Disp: , Rfl:  ?  Multiple Vitamin (MULTIVITAMIN) capsule, Take 1 capsule by mouth daily., Disp: , Rfl:  ?  naloxone (NARCAN) nasal spray 4 mg/0.1 mL, , Disp: , Rfl:  ?  nitroGLYCERIN (NITROSTAT) 0.4 MG SL tablet, , Disp: , Rfl:  ?  oxyCODONE-acetaminophen (PERCOCET/ROXICET) 5-325 MG tablet, Take 1 tablet by mouth every 8 (eight) hours as needed for severe pain., Disp: 90  tablet, Rfl: 0 ?  pentoxifylline (TRENTAL) 400 MG CR tablet, Take 45m tab daily x 1 week, then 4018mBID; take with food, Disp: 60 tablet, Rfl: 5 ?  promethazine (PHENERGAN) 25 MG suppository, Place 1 suppository (25 mg total) rectally every 6 (six) hours as needed for nausea or vomiting. (Patient not taking: Reported on 01/27/2021), Disp: 12 each, Rfl: 0 ?  senna-docusate (SENOKOT-S) 8.6-50 MG tablet, Take 2 tablets by mouth daily., Disp: , Rfl:  ?  vitamin E 180 MG (400 UNITS) capsule, Take 400 IU daily x 1 week, then 400 IU BID, Disp: 60 capsule, Rfl: 5 ? ?Allergies:  ?Allergies  ?Allergen Reactions  ? Gabapentin Swelling  ?  Leg swelling ?  ? Levetiracetam Other (See Comments)  ?  Personality changes  ? Pregabalin Swelling  ?  Leg swelling ?  ? Bupropion Nausea And Vomiting  ? Duloxetine Other (See Comments)  ?  Excessive sleeping  ? Ezetimibe Other (See Comments)  ? Other Other (See Comments)  ?  Antibiotics / post. C-diff  ? Prochlorperazine Nausea And Vomiting  ? Lamotrigine Itching and Rash  ? ? ?Past Medical History, Surgical history, Social history, and Family History were reviewed and updated. ? ?Review of Systems: ?Review of Systems  ?Constitutional: Negative.   ?HENT:  Negative.    ?Eyes: Negative.   ?Respiratory: Negative.    ?Cardiovascular: Negative.   ?Gastrointestinal: Negative.   ?Endocrine: Negative.   ?Genitourinary: Negative.    ?Musculoskeletal: Negative.   ?Skin: Negative.   ?Neurological: Negative.   ?Hematological: Negative.   ?Psychiatric/Behavioral:  The patient is nervous/anxious.   ? ?Physical Exam: ? weight is 169 lb (76.7 kg). His oral temperature is 97.5 ?F (36.4 ?C) (abnormal). His blood pressure is 117/77 and his pulse is 94. His respiration is 20 and oxygen saturation is 98%.  ? ?Wt Readings from Last 3 Encounters:  ?06/11/21 169 lb (76.7 kg)  ?06/08/21 169 lb (76.7 kg)  ?06/02/21 167 lb 1.9 oz (75.8 kg)  ? ? ?Physical Exam ?Vitals reviewed.  ?HENT:  ?   Head: Normocephalic and  atraumatic.  ?Eyes:  ?   Pupils: Pupils are equal, round, and reactive to light.  ?Cardiovascular:  ?   Rate and Rhythm: Normal rate and regular rhythm.  ?   Heart sounds: Normal heart sounds.  ?Pulmonary:  ?   Effort: Pulmonary effort is normal.  ?   Breath sounds: Normal breath sounds.  ?Abdominal:  ?   General: Bowel sounds are normal.  ?   Palpations: Abdomen is soft.  ?  Musculoskeletal:     ?   General: No tenderness or deformity. Normal range of motion.  ?   Cervical back: Normal range of motion.  ?Lymphadenopathy:  ?   Cervical: No cervical adenopathy.  ?Skin: ?   General: Skin is warm and dry.  ?   Findings: No erythema or rash.  ?   Comments: On his skin exam, there are very scattered small erythematous maculopapular lesions.  These are maybe a millimeter in size.  They are pruritic.  There might be a central eschar.  There is no pustules.  ?Neurological:  ?   Mental Status: He is alert and oriented to person, place, and time.  ?Psychiatric:     ?   Behavior: Behavior normal.     ?   Thought Content: Thought content normal.     ?   Judgment: Judgment normal.  ? ? ? ?Lab Results  ?Component Value Date  ? WBC 6.9 06/08/2021  ? HGB 13.4 06/08/2021  ? HCT 41.2 06/08/2021  ? MCV 92.0 06/08/2021  ? PLT 364 06/08/2021  ? ?  Chemistry   ?   ?Component Value Date/Time  ? NA 136 06/08/2021 0920  ? K 4.2 06/08/2021 0920  ? CL 104 06/08/2021 0920  ? CO2 24 06/08/2021 0920  ? BUN 11 06/08/2021 0920  ? CREATININE 0.84 06/08/2021 0920  ? CREATININE 1.02 06/02/2021 1505  ?    ?Component Value Date/Time  ? CALCIUM 9.1 06/08/2021 0920  ? ALKPHOS 46 06/08/2021 0920  ? AST 22 06/08/2021 0920  ? AST 19 06/02/2021 1505  ? ALT 25 06/08/2021 0920  ? ALT 23 06/02/2021 1505  ? BILITOT 0.3 06/08/2021 0920  ? BILITOT 0.4 06/02/2021 1505  ?  ? ? ?Impression and Plan: ?Bill Garza is a very nice 61 year old white male.  He has a history of metastatic adenocarcinoma of the lung.  He initially presented with localized-stage IIB  -adenocarcinoma of the left lung.  He was placed on clinical trial.  He only had 1 cycle of treatment. ? 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  He did undergo resection of a lesion back in August

## 2021-06-15 ENCOUNTER — Telehealth: Payer: Self-pay | Admitting: Radiation Therapy

## 2021-06-15 NOTE — Telephone Encounter (Signed)
I called and spoke with Mrs. Gemmill about the appointment we have set up for the patient to see Dr. Salomon Fick. The visit is scheduled for Monday 4/3 @ 10:30. Unfortunately she is not able to get away from work to take him on Monday. Mrs. Loyd is going to call Dr. Marygrace Drought office back to reschedule this visit when she is able to be there as well.  ? ?The January and March Brain MRI scans have been shared in [facility] for Dr. Marygrace Drought visit.  ? ?Mont Dutton R.T.(R)(T) ?Radiation Special Procedures Navigator  ?

## 2021-06-16 ENCOUNTER — Ambulatory Visit: Payer: Medicare HMO | Admitting: Radiation Oncology

## 2021-06-21 ENCOUNTER — Other Ambulatory Visit: Payer: Self-pay | Admitting: *Deleted

## 2021-06-21 ENCOUNTER — Telehealth: Payer: Self-pay | Admitting: *Deleted

## 2021-06-21 ENCOUNTER — Encounter: Payer: Self-pay | Admitting: *Deleted

## 2021-06-21 DIAGNOSIS — C7931 Secondary malignant neoplasm of brain: Secondary | ICD-10-CM

## 2021-06-21 MED ORDER — LORAZEPAM 1 MG PO TABS: 1.0000 mg | ORAL_TABLET | Freq: Three times a day (TID) | ORAL | 0 refills | Status: DC | PRN

## 2021-06-21 MED ORDER — OXYCODONE-ACETAMINOPHEN 5-325 MG PO TABS: 1.0000 | ORAL_TABLET | Freq: Four times a day (QID) | ORAL | 0 refills | Status: DC | PRN

## 2021-06-21 NOTE — Telephone Encounter (Signed)
Patient wife Langley Gauss called to request refills on Percocet.  Patient is taking 2 tablets every 4 hours.  Rx says to be taken 1 every 8 hours as needed.  Dr Marin Olp notified.  Dr Marin Olp wants to increase Duragesic patch to 50 mcg.  Advised patient to put 2 patches on until they are gone then call our office to refill a 50 mcg patch.  Wants patient to take 1 Percocet every 6 hours prn for break through pain.  Wife understands instructions and appreciates call.  ?

## 2021-06-22 ENCOUNTER — Ambulatory Visit (HOSPITAL_COMMUNITY)
Admission: RE | Admit: 2021-06-22 | Discharge: 2021-06-22 | Disposition: A | Discharge status: Home / Self Care | Payer: Medicare HMO | Source: Ambulatory Visit | Attending: Hematology & Oncology | Admitting: Hematology & Oncology

## 2021-06-22 DIAGNOSIS — C3490 Malignant neoplasm of unspecified part of unspecified bronchus or lung: Secondary | ICD-10-CM | POA: Insufficient documentation

## 2021-06-22 DIAGNOSIS — C7931 Secondary malignant neoplasm of brain: Secondary | ICD-10-CM | POA: Diagnosis present

## 2021-06-22 LAB — GLUCOSE, CAPILLARY: Glucose-Capillary: 133 mg/dL — ABNORMAL HIGH (ref 70–99)

## 2021-06-22 IMAGING — CT NM PET TUM IMG RESTAG (PS) SKULL BASE T - THIGH
1 of 7 series · 1 of 25 positions shown · non-contrast
Comparison: Multiple exams, including PET-CT [DATE] and CT
scans including [DATE]

CLINICAL DATA: Subsequent treatment strategy for non-small cell
lung cancer.

EXAM:
NUCLEAR MEDICINE PET SKULL BASE TO THIGH
TECHNIQUE: 8.4 mCi F-18 FDG was injected intravenously. Full-ring PET imaging
was performed from the skull base to thigh after the radiotracer. CT
data was obtained and used for attenuation correction and anatomic
localization.
Fasting blood glucose: 133 mg/dl

[Series 3: pet sk_thigh ac · axial · 5.0mm · 4.07mm/px · 1 of 260 slices shown]
[im 156/260]
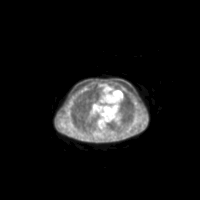

[1 of 25 positions shown; findings below may reference images not displayed]

FINDINGS: Mediastinal blood pool activity: SUV max

Liver activity: SUV max NA

NECK: The head was also included in not just the neck. There is a
hypermetabolic component of the known left frontal lobe metastatic
lesion, slightly higher than the surrounding cortex, maximum SUV
8.4, with surrounding vasogenic edema. There is also a small
hypermetabolic lesion in the cerebellar vermis corresponding to the
known metastatic lesion maximum SUV 6.4. Changes in these lesions
are probably best assess morphologically by brain MRI.

Incidental CT findings: Chronic left maxillary sinusitis. Prior left
posterior craniotomy. Bilateral carotid atherosclerosis.

CHEST: Low-grade activity associated with hazy ground-glass and
faint reticulonodular opacity anteromedially in the right upper lobe
as on image 39 series 8, representative SUV 3.1, benign etiologies
such as postinflammatory etiology favored. This process was not
present on the CT chest from 4 days ago.

4 mm right lower lobe nodule on image 60 of series 8 is stable not
appreciably hypermetabolic but below sensitive PET-CT size
thresholds.

Incidental CT findings: Coronary, aortic arch, and branch vessel
atherosclerotic vascular disease. Centrilobular emphysema. Left
upper lobectomy.

ABDOMEN/PELVIS: Subtle accentuated activity in the right hepatic
lobe maximum SUV of 4.4 compared to typical background liver
activity of about 3.0, no substantial abnormality in this region on
the recent diagnostic CT, significance uncertain.

Hypermetabolic portacaval lymph node measuring 1.4 cm in short axis
on image 141 series 4, maximum SUV 7.2, compatible with malignancy.
This lymph node is not previously hypermetabolic.

Incidental CT findings: Atherosclerosis is present, including
aortoiliac atherosclerotic disease. Prominent stool throughout the
colon favors constipation.

SKELETON: New 0.9 cm lytic hypermetabolic focus in the right iliac
bone, maximum SUV 9.0, compatible osseous metastatic disease. Subtle
accentuated activity medially in the left iliac with maximum SUV
2.8, no CT abnormality, this is nonspecific but merits surveillance.

Incidental CT findings: none
IMPRESSION: 1. Newly hypermetabolic portacaval adenopathy and new hypermetabolic
lytic lesion in the right iliac crest compatible with metastatic
disease. The patient also has 2 hypermetabolic lesions in the brain
as characterized on recent brain MRI.
2. Low-grade activity associated with hazy ground-glass and
reticulonodular opacity anteromedially in the right upper lobe, felt
to be inflammatory given that this was not present on the chest CT
from 4 days ago.
3. Very faint foci of potentially accentuated metabolic activity in
the right hepatic lobe and left iliac bone, indeterminate for
malignancy and not readily apparent on the CT data today or the CT
scan from [DATE]. Surveillance suggested.
4. 4 mm right lower lobe nodule is stable and not appreciably
hypermetabolic but below sensitive PET-CT size thresholds.
5. Other imaging findings of potential clinical significance:
Chronic left maxillary sinusitis. Aortic Atherosclerosis
([K7]-[K7]). Coronary and systemic atherosclerosis. Emphysema
([K7]-[K7]). Prominent stool throughout the colon favors
constipation.

## 2021-06-22 MED ORDER — FLUDEOXYGLUCOSE F - 18 (FDG) INJECTION
8.5000 | Freq: Once | INTRAVENOUS | Status: AC | PRN
  Administered 2021-06-22: 8.38 via INTRAVENOUS

## 2021-06-23 ENCOUNTER — Other Ambulatory Visit: Payer: Self-pay

## 2021-06-23 ENCOUNTER — Inpatient Hospital Stay: Payer: Medicare HMO | Attending: Hematology & Oncology

## 2021-06-23 VITALS — BP 156/90 | HR 73 | Temp 97.6°F | Resp 18

## 2021-06-23 DIAGNOSIS — Z9221 Personal history of antineoplastic chemotherapy: Secondary | ICD-10-CM | POA: Insufficient documentation

## 2021-06-23 DIAGNOSIS — K5900 Constipation, unspecified: Secondary | ICD-10-CM | POA: Insufficient documentation

## 2021-06-23 DIAGNOSIS — Z87891 Personal history of nicotine dependence: Secondary | ICD-10-CM | POA: Diagnosis not present

## 2021-06-23 DIAGNOSIS — G4700 Insomnia, unspecified: Secondary | ICD-10-CM | POA: Insufficient documentation

## 2021-06-23 DIAGNOSIS — C7951 Secondary malignant neoplasm of bone: Secondary | ICD-10-CM | POA: Diagnosis not present

## 2021-06-23 DIAGNOSIS — F419 Anxiety disorder, unspecified: Secondary | ICD-10-CM | POA: Diagnosis not present

## 2021-06-23 DIAGNOSIS — I252 Old myocardial infarction: Secondary | ICD-10-CM | POA: Insufficient documentation

## 2021-06-23 DIAGNOSIS — Z79891 Long term (current) use of opiate analgesic: Secondary | ICD-10-CM | POA: Insufficient documentation

## 2021-06-23 DIAGNOSIS — E860 Dehydration: Secondary | ICD-10-CM | POA: Diagnosis not present

## 2021-06-23 DIAGNOSIS — F32A Depression, unspecified: Secondary | ICD-10-CM | POA: Diagnosis not present

## 2021-06-23 DIAGNOSIS — J449 Chronic obstructive pulmonary disease, unspecified: Secondary | ICD-10-CM | POA: Insufficient documentation

## 2021-06-23 DIAGNOSIS — Z923 Personal history of irradiation: Secondary | ICD-10-CM | POA: Diagnosis not present

## 2021-06-23 DIAGNOSIS — C7931 Secondary malignant neoplasm of brain: Secondary | ICD-10-CM | POA: Diagnosis not present

## 2021-06-23 DIAGNOSIS — C3490 Malignant neoplasm of unspecified part of unspecified bronchus or lung: Secondary | ICD-10-CM

## 2021-06-23 DIAGNOSIS — Z515 Encounter for palliative care: Secondary | ICD-10-CM | POA: Insufficient documentation

## 2021-06-23 DIAGNOSIS — C3492 Malignant neoplasm of unspecified part of left bronchus or lung: Secondary | ICD-10-CM | POA: Insufficient documentation

## 2021-06-23 DIAGNOSIS — G893 Neoplasm related pain (acute) (chronic): Secondary | ICD-10-CM | POA: Diagnosis not present

## 2021-06-23 DIAGNOSIS — E119 Type 2 diabetes mellitus without complications: Secondary | ICD-10-CM | POA: Insufficient documentation

## 2021-06-23 MED ORDER — HYDROMORPHONE HCL 1 MG/ML IJ SOLN
1.0000 mg | Freq: Once | INTRAMUSCULAR | Status: AC
  Administered 2021-06-23: 1 mg via INTRAVENOUS
  Filled 2021-06-23: qty 1

## 2021-06-23 MED ORDER — SODIUM CHLORIDE 0.9 % IV SOLN: Freq: Once | INTRAVENOUS | Status: AC

## 2021-06-23 MED ORDER — HYDROMORPHONE HCL 1 MG/ML IJ SOLN
0.5000 mg | Freq: Once | INTRAMUSCULAR | Status: AC
  Administered 2021-06-23: 0.5 mg via INTRAVENOUS
  Filled 2021-06-23: qty 1

## 2021-06-23 NOTE — Patient Instructions (Signed)

## 2021-06-25 ENCOUNTER — Other Ambulatory Visit: Payer: Self-pay | Admitting: Radiation Therapy

## 2021-06-28 ENCOUNTER — Other Ambulatory Visit: Payer: Self-pay

## 2021-06-28 ENCOUNTER — Telehealth: Payer: Self-pay | Admitting: Radiation Therapy

## 2021-06-28 ENCOUNTER — Emergency Department (HOSPITAL_COMMUNITY): Payer: Medicare HMO

## 2021-06-28 ENCOUNTER — Other Ambulatory Visit: Payer: Self-pay | Admitting: Hematology & Oncology

## 2021-06-28 ENCOUNTER — Emergency Department (HOSPITAL_COMMUNITY)
Admission: EM | Admit: 2021-06-28 | Discharge: 2021-06-29 | Disposition: A | Discharge status: Home / Self Care | Payer: Medicare HMO | Attending: Emergency Medicine | Admitting: Emergency Medicine

## 2021-06-28 ENCOUNTER — Telehealth: Payer: Self-pay | Admitting: *Deleted

## 2021-06-28 ENCOUNTER — Encounter (HOSPITAL_COMMUNITY): Payer: Self-pay

## 2021-06-28 DIAGNOSIS — Z7984 Long term (current) use of oral hypoglycemic drugs: Secondary | ICD-10-CM | POA: Insufficient documentation

## 2021-06-28 DIAGNOSIS — Z79899 Other long term (current) drug therapy: Secondary | ICD-10-CM | POA: Insufficient documentation

## 2021-06-28 DIAGNOSIS — Z7982 Long term (current) use of aspirin: Secondary | ICD-10-CM | POA: Diagnosis not present

## 2021-06-28 DIAGNOSIS — I2510 Atherosclerotic heart disease of native coronary artery without angina pectoris: Secondary | ICD-10-CM | POA: Insufficient documentation

## 2021-06-28 DIAGNOSIS — E119 Type 2 diabetes mellitus without complications: Secondary | ICD-10-CM | POA: Diagnosis not present

## 2021-06-28 DIAGNOSIS — Z87891 Personal history of nicotine dependence: Secondary | ICD-10-CM | POA: Diagnosis not present

## 2021-06-28 DIAGNOSIS — J449 Chronic obstructive pulmonary disease, unspecified: Secondary | ICD-10-CM | POA: Diagnosis not present

## 2021-06-28 DIAGNOSIS — Z7951 Long term (current) use of inhaled steroids: Secondary | ICD-10-CM | POA: Diagnosis not present

## 2021-06-28 DIAGNOSIS — M549 Dorsalgia, unspecified: Secondary | ICD-10-CM | POA: Insufficient documentation

## 2021-06-28 DIAGNOSIS — G893 Neoplasm related pain (acute) (chronic): Secondary | ICD-10-CM | POA: Diagnosis not present

## 2021-06-28 LAB — COMPREHENSIVE METABOLIC PANEL
ALT: 50 U/L — ABNORMAL HIGH (ref 0–44)
AST: 28 U/L (ref 15–41)
Albumin: 3.6 g/dL (ref 3.5–5.0)
Alkaline Phosphatase: 48 U/L (ref 38–126)
Anion gap: 8 (ref 5–15)
BUN: 21 mg/dL — ABNORMAL HIGH (ref 6–20)
CO2: 22 mmol/L (ref 22–32)
Calcium: 9 mg/dL (ref 8.9–10.3)
Chloride: 101 mmol/L (ref 98–111)
Creatinine, Ser: 0.81 mg/dL (ref 0.61–1.24)
GFR, Estimated: >60 mL/min (ref >60)
Glucose, Bld: 185 mg/dL — ABNORMAL HIGH (ref 70–99)
Potassium: 4.2 mmol/L (ref 3.5–5.1)
Sodium: 131 mmol/L — ABNORMAL LOW (ref 135–145)
Total Bilirubin: 0.6 mg/dL (ref 0.3–1.2)
Total Protein: 6.7 g/dL (ref 6.5–8.1)

## 2021-06-28 LAB — CBC WITH DIFFERENTIAL/PLATELET
Abs Immature Granulocytes: 0.05 10*3/uL (ref 0.00–0.07)
Basophils Absolute: 0 10*3/uL (ref 0.0–0.1)
Basophils Relative: 0 %
Eosinophils Absolute: 0 10*3/uL (ref 0.0–0.5)
Eosinophils Relative: 0 %
HCT: 39.7 % (ref 39.0–52.0)
Hemoglobin: 13.6 g/dL (ref 13.0–17.0)
Immature Granulocytes: 1 %
Lymphocytes Relative: 16 %
Lymphs Abs: 1.8 10*3/uL (ref 0.7–4.0)
MCH: 31.3 pg (ref 26.0–34.0)
MCHC: 34.3 g/dL (ref 30.0–36.0)
MCV: 91.3 fL (ref 80.0–100.0)
Monocytes Absolute: 0.6 10*3/uL (ref 0.1–1.0)
Monocytes Relative: 5 %
Neutro Abs: 8.6 10*3/uL — ABNORMAL HIGH (ref 1.7–7.7)
Neutrophils Relative %: 78 %
Platelets: 435 10*3/uL — ABNORMAL HIGH (ref 150–400)
RBC: 4.35 MIL/uL (ref 4.22–5.81)
RDW: 16.8 % — ABNORMAL HIGH (ref 11.5–15.5)
WBC: 11 10*3/uL — ABNORMAL HIGH (ref 4.0–10.5)
nRBC: 0 % (ref 0.0–0.2)

## 2021-06-28 IMAGING — CR DG CHEST 2V
2 series · 2 of 2 positions shown · non-contrast
Comparison: [DATE]

CLINICAL DATA: Chest pain and shortness of breath for several
months, known history of lung carcinoma, initial encounter

EXAM:
CHEST - 2 VIEW

[w chest pa]
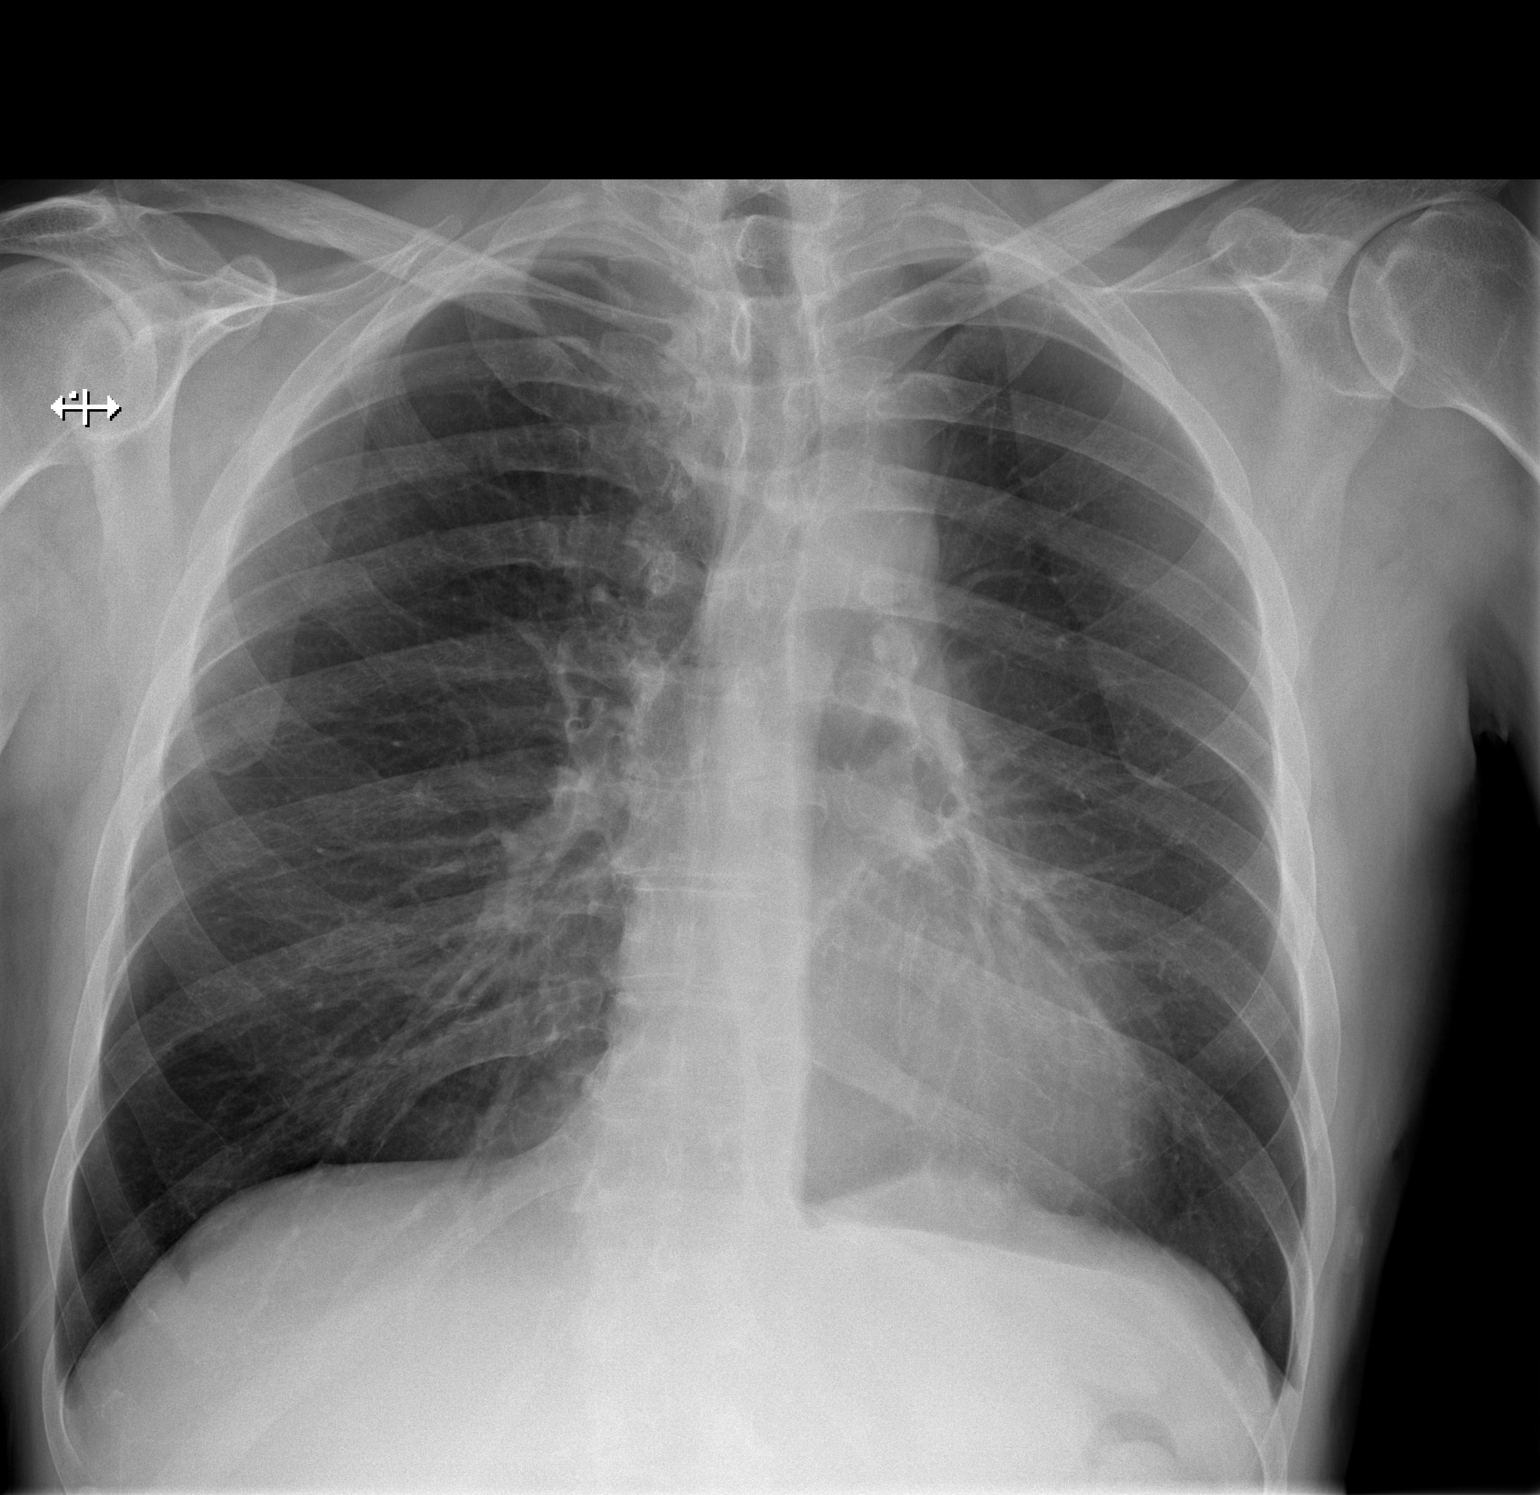

[w chest lat]
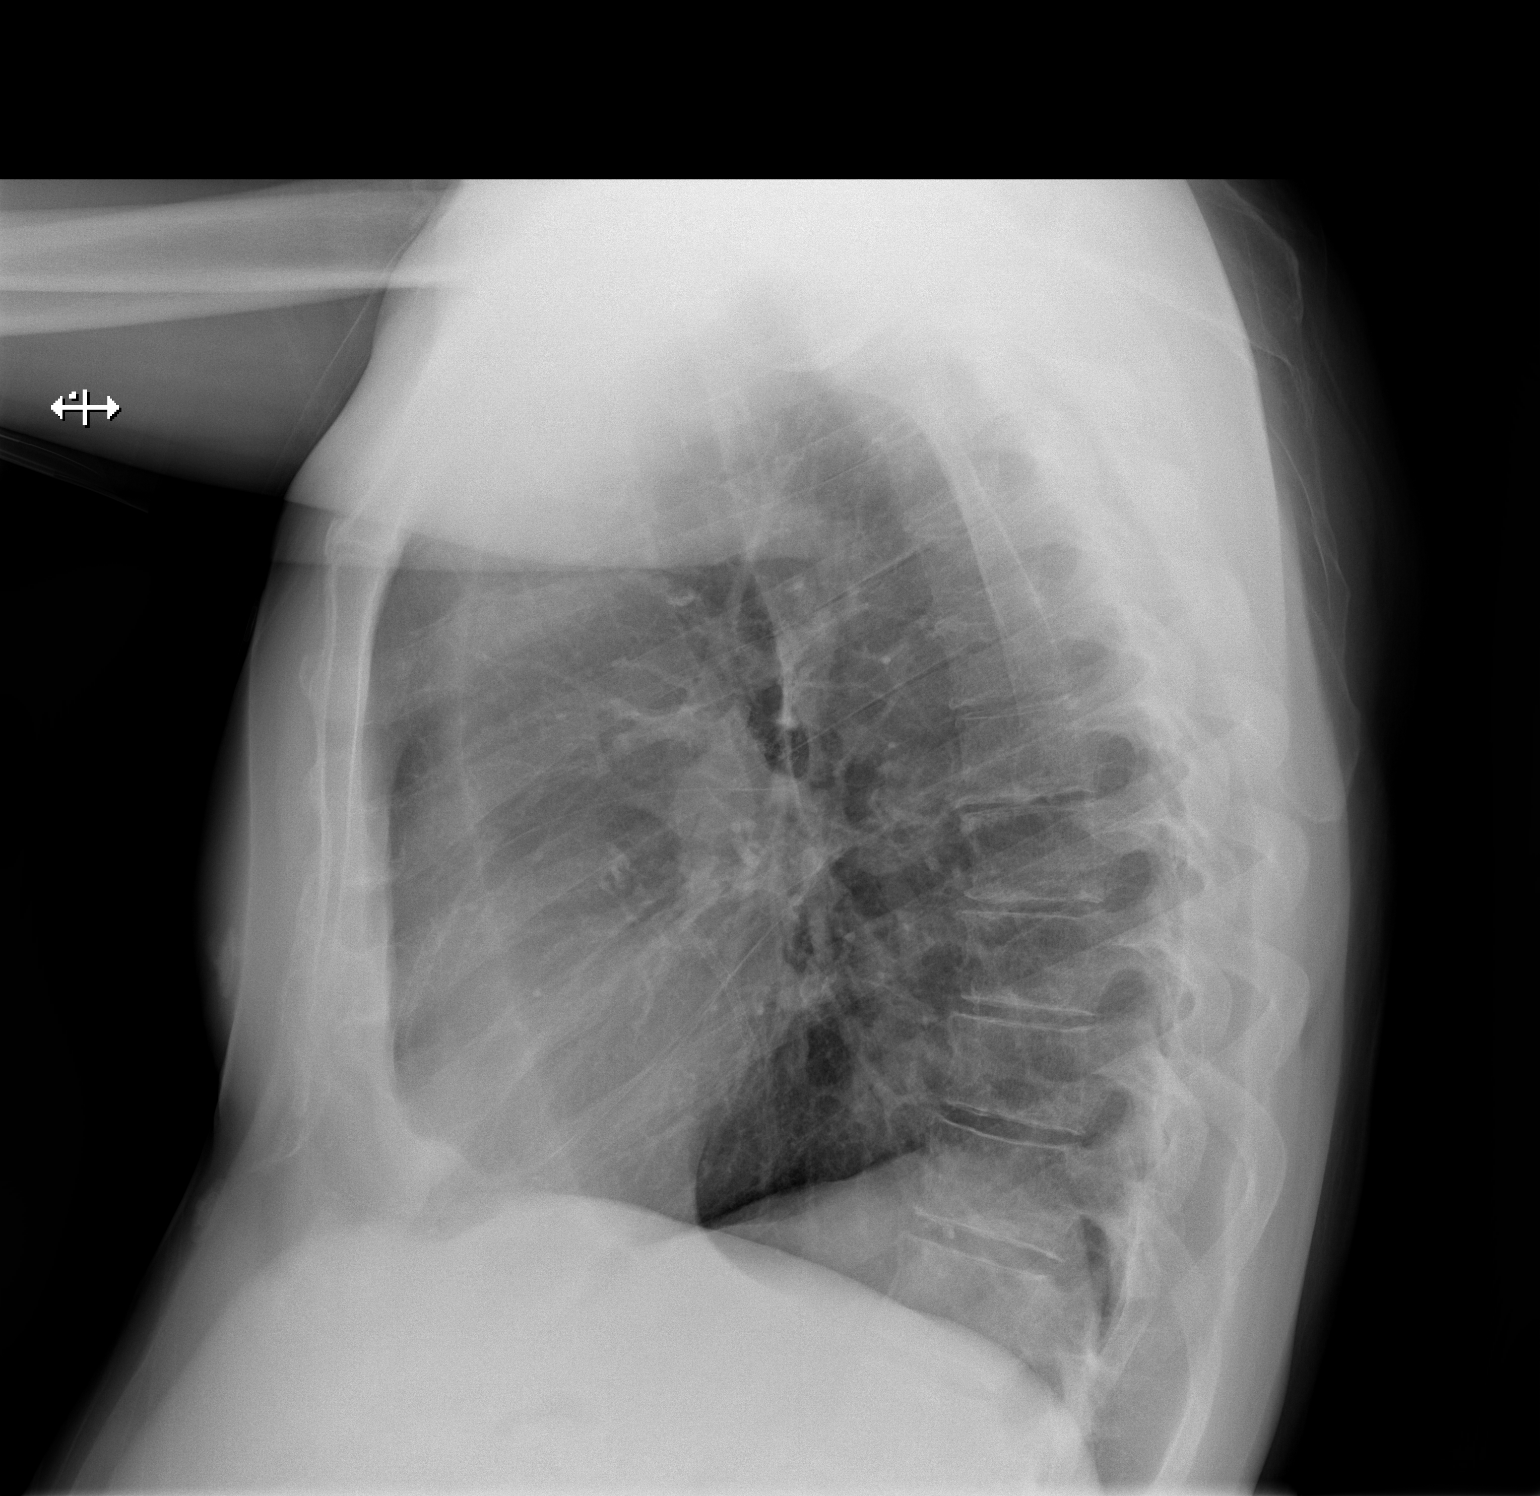

[2 of 2 positions shown; findings below may reference images not displayed]

FINDINGS: Cardiac shadow is within normal limits. Postsurgical changes are
noted in the left lung consistent with given clinical history. No
focal mass is seen. No pleural effusion or pneumothorax is noted. No
bony abnormality is seen.
IMPRESSION: No acute abnormality noted.

## 2021-06-28 MED ORDER — OXYCODONE HCL ER 20 MG PO T12A: 20.0000 mg | EXTENDED_RELEASE_TABLET | Freq: Two times a day (BID) | ORAL | 0 refills | Status: DC

## 2021-06-28 NOTE — ED Triage Notes (Signed)
Pt complains of back pain, SHOB, and chest pain for several months. Pt states that his sides are hurting. Pt reports that he will be having brain surgery on Wednesday and wants to be comfortable.  ?

## 2021-06-28 NOTE — Telephone Encounter (Signed)
Called [name] this morning to check in on Bill Garza and to also let her know that Dr. Isidore Moos approves of him discontinuing the trental and vit E Rx.  ? ?While on the phone, [name] asked if the new 0.9cm Rt. iliac bone lesion, demonstrated on the 06/22/21 PET scan, could be the cause of the severe pain the patient has had in his Rt flank and back?  ?They were scheduled to see [name] to review his PET results on 4/12, but that appointment was cancelled for his upcoming surgery at [facility]. Mrs. Study has reached out to Dr. Antonieta Pert office requesting a call back to review the PET results. She would like this to happen before [name] surgery.  ? ?I will also route this message to Dr. Isidore Moos so she is aware.  ? ?Mont Dutton R.T.(R)(T) ?Radiation Special Procedures Navigator  ?

## 2021-06-28 NOTE — Telephone Encounter (Signed)
Patient wife Langley Gauss called stating patient in a lot of pain. Has not taken his Duragesic patch because he doesn't like the way he feels.  Patient scheduled for a craniotomy Thursday.  Wants to make sure dr Marin Olp has seen PET scan.  Dr Marin Olp aware of above.  New Prescription written for long acting Oxycodone.  Explained to patient how to take this medication.  Dr Marin Olp would like to see patient in the office next week to discuss further treatment options.   ?

## 2021-06-28 NOTE — Telephone Encounter (Signed)
Per scheduling message Sheria Lang - called and gave upcoming appointment - confirmed ?

## 2021-06-29 ENCOUNTER — Other Ambulatory Visit: Payer: Self-pay

## 2021-06-29 ENCOUNTER — Encounter (HOSPITAL_COMMUNITY): Payer: Self-pay

## 2021-06-29 ENCOUNTER — Telehealth: Payer: Self-pay | Admitting: Radiation Therapy

## 2021-06-29 ENCOUNTER — Other Ambulatory Visit: Payer: Self-pay | Admitting: Radiation Therapy

## 2021-06-29 DIAGNOSIS — M549 Dorsalgia, unspecified: Secondary | ICD-10-CM

## 2021-06-29 MED ORDER — HYDROMORPHONE HCL 1 MG/ML IJ SOLN
1.0000 mg | Freq: Once | INTRAMUSCULAR | Status: AC
  Administered 2021-06-29: 1 mg via INTRAVENOUS
  Filled 2021-06-29: qty 1

## 2021-06-29 MED ORDER — DIPHENHYDRAMINE HCL 50 MG/ML IJ SOLN
25.0000 mg | Freq: Once | INTRAMUSCULAR | Status: AC | PRN
Start: 2021-06-29 — End: 2021-06-29
  Administered 2021-06-29: 25 mg via INTRAVENOUS
  Filled 2021-06-29: qty 1

## 2021-06-29 MED ORDER — ONDANSETRON HCL 4 MG/2ML IJ SOLN
4.0000 mg | Freq: Once | INTRAMUSCULAR | Status: AC
  Administered 2021-06-29: 4 mg via INTRAVENOUS
  Filled 2021-06-29: qty 2

## 2021-06-29 MED ORDER — HYDROMORPHONE HCL 2 MG/ML IJ SOLN
2.0000 mg | Freq: Once | INTRAMUSCULAR | Status: AC
  Administered 2021-06-29: 2 mg via INTRAVENOUS
  Filled 2021-06-29: qty 1

## 2021-06-29 MED ORDER — XTAMPZA ER 18 MG PO C12A: 18.0000 mg | EXTENDED_RELEASE_CAPSULE | Freq: Two times a day (BID) | ORAL | 0 refills | Status: DC

## 2021-06-29 NOTE — ED Provider Notes (Signed)
? ?[facility] ?Provider Note: Georgena Spurling, MD, FACEP ? ?CSN: [id number] ?MRN: [id number] ?ARRIVAL: 06/28/21 at 2149 ?ROOM: WA21/WA21 ? ? ?CHIEF COMPLAINT  ?Pain ? ? ?HISTORY OF PRESENT ILLNESS  ?06/29/21 12:03 AM ?Bill Garza is a 61 y.o. male with metastatic non-small cell lung cancer scheduled for brain surgery tomorrow.  He has had chronic pain for several months.  The pain is primarily in his back and the sides of his abdomen.  He is on chronic oxycodone for this but yesterday it did not adequately treat his pain.  He has tried fentanyl patches without relief.  He was prescribed OxyContin but cannot get this prescription until later today.  He is here requesting pain management.  He rates his pain as a 10 out of 10 and it is severe enough to make him short of breath.  He cannot find a comfortable position. ? ? ?Past Medical History:  ?Diagnosis Date  ? Chronic back pain   ? Chronic GERD   ? Chronic pain   ? COPD (chronic obstructive pulmonary disease) ([place])   ? Coronary artery disease   ? Depression   ? Diabetes mellitus without complication ([place])   ? Gastroparesis   ? Goals of care, counseling/discussion 01/27/2021  ? High risk medication use   ? Hyperlipidemia   ? MI (myocardial infarction) ([place])   ? Non-small cell lung cancer metastatic to brain ([place]) 01/27/2021  ? Restless leg syndrome   ? ? ?Past Surgical History:  ?Procedure Laterality Date  ? BACK SURGERY    ? CARDIAC CATHETERIZATION    ? ? ?History reviewed. No pertinent family history. ? ?Social History  ? ?Tobacco Use  ? Smoking status: Former  ?  Packs/day: 1.00  ?  Years: 40.00  ?  Pack years: 40.00  ?  Types: Cigarettes  ?  Quit date: 07/20/2020  ?  Years since quitting: 0.9  ? Smokeless tobacco: Never  ?Vaping Use  ? Vaping Use: Never used  ?Substance Use Topics  ? Alcohol use: Not Currently  ? Drug use: Not Currently  ? ? ?Prior to Admission medications   ?Medication Sig Start Date End Date Taking? Authorizing Provider  ?ACCU-CHEK GUIDE test  strip CHECK BLOOD SUGAR DAILY 11/20/20   [provider]  ?acetaminophen (TYLENOL) 325 MG tablet Take by mouth. 11/18/20   [provider]  ?albuterol (PROVENTIL) (2.5 MG/3ML) 0.083% nebulizer solution  01/25/21   [provider]  ?albuterol (VENTOLIN HFA) 108 (90 Base) MCG/ACT inhaler  06/11/20   [provider]  ?aspirin EC 81 MG tablet Take 81 mg by mouth daily.    [provider]  ?Bempedoic Acid (NEXLETOL) 180 MG TABS Take 1 tablet by mouth daily. 09/22/20   [provider]  ?Blood Glucose Monitoring Suppl (ACCU-CHEK GUIDE) w/Device KIT USE AS DIRECTED TO CHECK BLOOD SUGAR 11/20/20   [provider]  ?Blood Glucose Monitoring Suppl (GLUCOCOM BLOOD GLUCOSE MONITOR) DEVI Use to check blood sugar daily. 11/20/20   [provider]  ?budesonide-formoterol (SYMBICORT) 160-4.5 MCG/ACT inhaler Inhale 2 puffs into the lungs 2 (two) times daily.    [provider]  ?dexamethasone (DECADRON) 4 MG tablet Take 1 tablet (4 mg total) by mouth daily. 06/08/21   Blanchie Dessert, MD  ?escitalopram (LEXAPRO) 5 MG tablet Take 5 mg by mouth daily.    [provider]  ?fluconazole (DIFLUCAN) 100 MG tablet Take 1 tablet (100 mg total) by mouth daily. Take 2 pills daily for 3 days  then take 1 pill a day 06/11/21   [name], MD  ?fluocinolone (VANOS) 0.01 % cream Apply topically. 03/18/20   [provider]  ?glucose blood (PRECISION QID TEST) test strip Use to check blood sugar daily. 11/20/20   [provider]  ?lactulose (CHRONULAC) 10 GM/15ML solution Take 30 mLs (20 g total) by mouth 3 (three) times daily. 06/02/21   [name], MD  ?Lancets MISC Use to check blood sugar daily. 11/20/20   [provider]  ?LORazepam (ATIVAN) 1 MG tablet Take 1 tablet 60 min prior to PET or MRI, and one more tablet 30 minutes prior to imaging if needed for anxiety. (Take 1- 2 tablets PRN anxiety prior to imaging). 04/06/21   Eppie Gibson, MD  ?LORazepam (ATIVAN) 1 MG tablet Take 1 tablet (1 mg total) by mouth every 8 (eight) hours as needed for anxiety. 06/21/21   [name], MD  ?metFORMIN (GLUCOPHAGE) 500 MG tablet Take by mouth daily.    [provider]  ?metoCLOPramide (REGLAN) 10 MG tablet Take 2 tablets (20 mg total) by mouth 4 (four) times daily -  before meals and at bedtime. 06/02/21 06/02/22  [name], MD  ?montelukast (SINGULAIR) 10 MG tablet Take 10 mg by mouth at bedtime.    [provider]  ?Multiple Vitamin (MULTIVITAMIN) capsule Take 1 capsule by mouth daily.    [provider]  ?oxyCODONE (OXYCONTIN) 20 mg 12 hr tablet Take 1 tablet (20 mg total) by mouth every 12 (twelve) hours. 06/28/21   [name], MD  ?oxyCODONE-acetaminophen (PERCOCET/ROXICET) 5-325 MG tablet Take 1 tablet by mouth every 6 (six) hours as needed for severe pain. 06/21/21   [name], MD  ?senna-docusate (SENOKOT-S) 8.6-50 MG tablet Take 2 tablets by mouth daily. 05/24/21   [provider]  ? ? ?Allergies ?Gabapentin, Levetiracetam, Pregabalin, Bupropion, Duloxetine, Ezetimibe, Other, Prochlorperazine, and Lamotrigine ? ? ?REVIEW OF SYSTEMS  ?Negative except as noted here or in the History of Present Illness. ? ? ?PHYSICAL EXAMINATION  ?Initial Vital Signs ?Blood pressure (!) 136/91, pulse 85, temperature 97.6 ?F (36.4 ?C), temperature source Oral, resp. rate 18, height 6' (1.829 m), weight 78.9 kg, SpO2 98 %. ? ?Examination ?General: Well-developed, well-nourished male in no acute distress; appearance consistent with age of record ?HENT: normocephalic; atraumatic ?Eyes: Normal appearance ?Neck: supple ?Heart: regular rate and rhythm ?Lungs: clear to auscultation bilaterally ?Abdomen: soft; nondistended; nontender; bowel sounds present ?Extremities: No deformity; full range of motion; pulses normal ?Neurologic: Awake, alert and oriented; motor function intact in all extremities and symmetric; no facial  droop ?Skin: Warm and dry ?Psychiatric: Flat affect ? ? ?RESULTS  ?Summary of this visit's results, reviewed and interpreted by myself: ? ? EKG Interpretation ? ?Date/Time:  Monday June 28 2021 22:14:29 EDT ?Ventricular Rate:  87 ?PR Interval:  123 ?QRS Duration: 129 ?QT Interval:  366 ?QTC Calculation: 441 ?R Axis:   80 ?Text Interpretation: Sinus rhythm Right bundle branch block No significant change was found Confirmed by Jacinto Keil [phone]) on 06/29/2021 12:04:33 AM ?  ? ?  ? ?Laboratory Studies: ?Results for orders placed or performed during the hospital encounter of 06/28/21 (from the past 24 hour(s))  ?CBC with Differential     Status: Abnormal  ? Collection Time: 06/28/21 10:05 PM  ?Result Value Ref Range  ? WBC 11.0 (H) 4.0 - 10.5 K/uL  ? RBC 4.35 4.22 - 5.81 MIL/uL  ? Hemoglobin 13.6 13.0 -  17.0 g/dL  ? HCT 39.7 39.0 - 52.0 %  ? MCV 91.3 80.0 - 100.0 fL  ? MCH 31.3 26.0 - 34.0 pg  ? MCHC 34.3 30.0 - 36.0 g/dL  ? RDW 16.8 (H) 11.5 - 15.5 %  ? Platelets 435 (H) 150 - 400 K/uL  ? nRBC 0.0 0.0 - 0.2 %  ? Neutrophils Relative % 78 %  ? Neutro Abs 8.6 (H) 1.7 - 7.7 K/uL  ? Lymphocytes Relative 16 %  ? Lymphs Abs 1.8 0.7 - 4.0 K/uL  ? Monocytes Relative 5 %  ? Monocytes Absolute 0.6 0.1 - 1.0 K/uL  ? Eosinophils Relative 0 %  ? Eosinophils Absolute 0.0 0.0 - 0.5 K/uL  ? Basophils Relative 0 %  ? Basophils Absolute 0.0 0.0 - 0.1 K/uL  ? Immature Granulocytes 1 %  ? Abs Immature Granulocytes 0.05 0.00 - 0.07 K/uL  ?Comprehensive metabolic panel     Status: Abnormal  ? Collection Time: 06/28/21 10:05 PM  ?Result Value Ref Range  ? Sodium 131 (L) 135 - 145 mmol/L  ? Potassium 4.2 3.5 - 5.1 mmol/L  ? Chloride 101 98 - 111 mmol/L  ? CO2 22 22 - 32 mmol/L  ? Glucose, Bld 185 (H) 70 - 99 mg/dL  ? BUN 21 (H) 6 - 20 mg/dL  ? Creatinine, Ser 0.81 0.61 - 1.24 mg/dL  ? Calcium 9.0 8.9 - 10.3 mg/dL  ? Total Protein 6.7 6.5 - 8.1 g/dL  ? Albumin 3.6 3.5 - 5.0 g/dL  ? AST 28 15 - 41 U/L  ? ALT 50 (H) 0 - 44 U/L  ? Alkaline  Phosphatase 48 38 - 126 U/L  ? Total Bilirubin 0.6 0.3 - 1.2 mg/dL  ? GFR, Estimated >60 >60 mL/min  ? Anion gap 8 5 - 15  ? ?Imaging Studies: ?DG Chest 2 View ? ?Result Date: 06/28/2021 ?CLINICAL DATA:  Chest

## 2021-06-29 NOTE — Progress Notes (Signed)
Oxycodone HCl ER 20 mg tablets were denied by insurance and needed PA.  PA completed and denied.  Appeal was completed on 06/29/2021 and also was denied.  MD made aware.  Alternative of Xtampza ER was sent to patient's pharmacy.   ?

## 2021-06-29 NOTE — Telephone Encounter (Signed)
I spoke with Mrs. Appleby about her husband's upcoming appointments this morning.  ?He has been struggling with Rt flank and back pain for a couple months now. The cause of this pain is unknown. Mr. Lapoint had to go back to the ED yesterday due to uncontrolled pain after not being able to pick up the medications prescribed by [name]. [employer] auth delays)  ? ?Mr. Dang is scheduled for a craniotomy tomorrow at [facility] by Dr. Salomon Fick. Mrs Gallick is hopeful that her husband's pain will be managed during his hospital stay and they will have the medication needed after discharge. ? ?Dr. Isidore Moos has ordered a thoracic and lumbar MRI for further workup of Mr. Ruddock pain. These scans have been scheduled on 4/24, and Mrs. Moustafa is aware. I have pushed out the brain MRI we had set up, because he is having surgery tomorrow and will have imaging at [facility] for review.  ? ?The follow-up brain MRI will now be done in July, three months after his craniotomy.  ? ?I also offered a referral for Mrs. Aime to our social work/ counseling team. She has been very attentive to her husband during this time and it is wearing on her emotionally. Mrs. Depaul was thankful for this, but said she is OK for now. She just wants to know the cause of her husband's pain so it can be treated appropriately.  ? ?We will plan to review the post op brain imaging and path results during the 4/17 brain and spine conference, and then the spine imaging during the 5/1 conference. Mrs. Dinapoli would like to keep the appointment with Dr. Christella Noa on 5/4 for now, but may cancel.  ? ? ?Mont Dutton R.T.(R)(T) ?Radiation Special Procedures Navigator ?

## 2021-06-30 ENCOUNTER — Inpatient Hospital Stay: Payer: Medicare HMO

## 2021-06-30 ENCOUNTER — Ambulatory Visit: Payer: Medicare HMO | Admitting: Hematology & Oncology

## 2021-07-01 ENCOUNTER — Other Ambulatory Visit: Payer: Self-pay | Admitting: Radiation Therapy

## 2021-07-01 ENCOUNTER — Ambulatory Visit
Admission: RE | Admit: 2021-07-01 | Discharge: 2021-07-01 | Disposition: A | Discharge status: Home / Self Care | Payer: Self-pay | Source: Ambulatory Visit | Attending: Radiation Oncology | Admitting: Radiation Oncology

## 2021-07-01 ENCOUNTER — Telehealth: Payer: Self-pay | Admitting: *Deleted

## 2021-07-01 DIAGNOSIS — C7931 Secondary malignant neoplasm of brain: Secondary | ICD-10-CM

## 2021-07-02 ENCOUNTER — Other Ambulatory Visit: Payer: Self-pay | Admitting: Radiation Therapy

## 2021-07-02 ENCOUNTER — Ambulatory Visit
Admission: RE | Admit: 2021-07-02 | Discharge: 2021-07-02 | Disposition: A | Discharge status: Home / Self Care | Payer: Self-pay | Source: Ambulatory Visit | Attending: Radiation Oncology | Admitting: Radiation Oncology

## 2021-07-02 DIAGNOSIS — C7949 Secondary malignant neoplasm of other parts of nervous system: Secondary | ICD-10-CM

## 2021-07-02 NOTE — Telephone Encounter (Signed)
Late entry:  Call placed to pt.'s wife to check on pt.'s status per order of [name].  Langley Gauss states that pt had a lot of pain during the night to his side and back and that she was hoping for a better day today.  [name] notified.  ?

## 2021-07-05 ENCOUNTER — Inpatient Hospital Stay: Payer: Medicare HMO

## 2021-07-06 ENCOUNTER — Inpatient Hospital Stay: Payer: Medicare HMO

## 2021-07-06 ENCOUNTER — Telehealth: Payer: Self-pay

## 2021-07-06 ENCOUNTER — Other Ambulatory Visit: Payer: Self-pay | Admitting: Pharmacist

## 2021-07-06 ENCOUNTER — Inpatient Hospital Stay (HOSPITAL_BASED_OUTPATIENT_CLINIC_OR_DEPARTMENT_OTHER): Payer: Medicare HMO | Admitting: Hematology & Oncology

## 2021-07-06 ENCOUNTER — Encounter: Payer: Self-pay | Admitting: Hematology & Oncology

## 2021-07-06 VITALS — BP 158/85 | HR 90 | Temp 97.8°F | Resp 18 | Ht 72.0 in | Wt 169.0 lb

## 2021-07-06 DIAGNOSIS — C3490 Malignant neoplasm of unspecified part of unspecified bronchus or lung: Secondary | ICD-10-CM

## 2021-07-06 DIAGNOSIS — C7931 Secondary malignant neoplasm of brain: Secondary | ICD-10-CM | POA: Diagnosis not present

## 2021-07-06 DIAGNOSIS — G893 Neoplasm related pain (acute) (chronic): Secondary | ICD-10-CM

## 2021-07-06 DIAGNOSIS — C3492 Malignant neoplasm of unspecified part of left bronchus or lung: Secondary | ICD-10-CM | POA: Diagnosis not present

## 2021-07-06 LAB — CBC WITH DIFFERENTIAL (CANCER CENTER ONLY)
Abs Immature Granulocytes: 0.08 10*3/uL — ABNORMAL HIGH (ref 0.00–0.07)
Basophils Absolute: 0 10*3/uL (ref 0.0–0.1)
Basophils Relative: 0 %
Eosinophils Absolute: 0 10*3/uL (ref 0.0–0.5)
Eosinophils Relative: 0 %
HCT: 39.1 % (ref 39.0–52.0)
Hemoglobin: 13.2 g/dL (ref 13.0–17.0)
Immature Granulocytes: 1 %
Lymphocytes Relative: 14 %
Lymphs Abs: 1.1 10*3/uL (ref 0.7–4.0)
MCH: 30.7 pg (ref 26.0–34.0)
MCHC: 33.8 g/dL (ref 30.0–36.0)
MCV: 90.9 fL (ref 80.0–100.0)
Monocytes Absolute: 0.4 10*3/uL (ref 0.1–1.0)
Monocytes Relative: 5 %
Neutro Abs: 5.9 10*3/uL (ref 1.7–7.7)
Neutrophils Relative %: 80 %
Platelet Count: 404 10*3/uL — ABNORMAL HIGH (ref 150–400)
RBC: 4.3 MIL/uL (ref 4.22–5.81)
RDW: 16.1 % — ABNORMAL HIGH (ref 11.5–15.5)
WBC Count: 7.4 10*3/uL (ref 4.0–10.5)
nRBC: 0 % (ref 0.0–0.2)

## 2021-07-06 LAB — CMP (CANCER CENTER ONLY)
ALT: 65 U/L — ABNORMAL HIGH (ref 0–44)
AST: 26 U/L (ref 15–41)
Albumin: 3.1 g/dL — ABNORMAL LOW (ref 3.5–5.0)
Alkaline Phosphatase: 64 U/L (ref 38–126)
Anion gap: 9 (ref 5–15)
BUN: 22 mg/dL — ABNORMAL HIGH (ref 6–20)
CO2: 25 mmol/L (ref 22–32)
Calcium: 8.9 mg/dL (ref 8.9–10.3)
Chloride: 97 mmol/L — ABNORMAL LOW (ref 98–111)
Creatinine: 0.89 mg/dL (ref 0.61–1.24)
GFR, Estimated: >60 mL/min (ref >60)
Glucose, Bld: 192 mg/dL — ABNORMAL HIGH (ref 70–99)
Potassium: 4.4 mmol/L (ref 3.5–5.1)
Sodium: 131 mmol/L — ABNORMAL LOW (ref 135–145)
Total Bilirubin: 0.3 mg/dL (ref 0.3–1.2)
Total Protein: 6.5 g/dL (ref 6.5–8.1)

## 2021-07-06 LAB — LACTATE DEHYDROGENASE: LDH: 153 U/L (ref 98–192)

## 2021-07-06 MED ORDER — HYDROMORPHONE HCL 1 MG/ML IJ SOLN: 2.0000 mg | Freq: Once | INTRAMUSCULAR | Status: DC

## 2021-07-06 MED ORDER — LACTULOSE 10 GM/15ML PO SOLN: 20.0000 g | Freq: Three times a day (TID) | ORAL | 4 refills | Status: DC

## 2021-07-06 MED ORDER — HYDROMORPHONE HCL 1 MG/ML IJ SOLN
2.0000 mg | Freq: Once | INTRAMUSCULAR | Status: DC
  Filled 2021-07-06: qty 2

## 2021-07-06 MED ORDER — HYDROMORPHONE HCL 4 MG PO TABS: 4.0000 mg | ORAL_TABLET | ORAL | 0 refills | Status: DC | PRN

## 2021-07-06 MED ORDER — LORAZEPAM 1 MG PO TABS: 1.0000 mg | ORAL_TABLET | Freq: Four times a day (QID) | ORAL | 0 refills | Status: DC | PRN

## 2021-07-06 MED ORDER — HYDROMORPHONE HCL 1 MG/ML IJ SOLN
2.0000 mg | Freq: Once | INTRAMUSCULAR | Status: AC
  Administered 2021-07-06: 2 mg via SUBCUTANEOUS

## 2021-07-06 NOTE — Progress Notes (Signed)
Delton See is a nonpreferred product with patient's insurance. ?Changing orders to [facility] to meet insurance requirements per Dr. Antonieta Pert instructions. ?

## 2021-07-06 NOTE — Patient Instructions (Signed)
Hydromorphone Injection ?What is this medication? ?HYDROMORPHONE (hye droe MOR fone) treats severe pain. It is prescribed when other pain medications have not worked or cannot be tolerated. It works by blocking pain signals in the brain. It belongs to a group of medications called opioids. ?This medicine may be used for other purposes; ask your health care provider or pharmacist if you have questions. ?COMMON BRAND NAME(S): Dilaudid, Dilaudid-HP, Simplist Dilaudid ?What should I tell my care team before I take this medication? ?They need to know if you have any of these conditions: ?Brain tumor ?Drug abuse or addiction ?Head injury ?Heart disease ?If you often drink alcohol ?Kidney disease ?Liver disease ?Lung disease, asthma, or breathing problems ?Seizures ?Stomach or intestinal problems ?Taken an MAOI like Marplan, Nardil, or Parnate in the last 14 days ?An unusual or allergic reaction to hydromorphone, other medicines, foods, dyes, or preservatives ?Pregnant or trying to get pregnant ?Breast-feeding ?How should I use this medication? ?This medication is for injection into a vein, into a muscle, or under the skin. This medication is given in a hospital or clinic. ?In rare cases, you might get this medication at home. You will be taught how to give this medication. Use exactly as directed. Take your medication at regular intervals. Do not take your medication more often than directed. ?It is important that you put your used needles and syringes in a special sharps container. Do not put them in a trash can. If you do not have a sharps container, call your pharmacist or care team to get one. ?Talk to your care team about the use of this medication in children. Special care may be needed. ?Overdosage: If you think you have taken too much of this medicine contact a poison control center or emergency room at once. ?NOTE: This medicine is only for you. Do not share this medicine with others. ?What if I miss a dose? ?If  you miss a dose, use it as soon as you can. If it is almost time for your next dose, use only that dose. Do not use double or extra doses. ?What may interact with this medication? ?This medication may interact with the following: ?Alcohol ?Antihistamines for allergy, cough and cold ?Certain medications for anxiety or sleep ?Certain medications for depression like amitriptyline, fluoxetine, sertraline ?Certain medications for seizures like phenobarbital, primidone ?General anesthetics like halothane, isoflurane, methoxyflurane, propofol ?Local anesthetics like lidocaine, pramoxine, tetracaine ?MAOIs like Carbex, Eldepryl, Marplan, Nardil, and Parnate ?Medications that relax muscles for surgery ?Other narcotic medications for pain or cough ?Phenothiazines like chlorpromazine, mesoridazine, prochlorperazine, thioridazine ?This list may not describe all possible interactions. Give your health care provider a list of all the medicines, herbs, non-prescription drugs, or dietary supplements you use. Also tell them if you smoke, drink alcohol, or use illegal drugs. Some items may interact with your medicine. ?What should I watch for while using this medication? ?Tell your care team if your pain does not go away, if it gets worse, or if you have new or a different type of pain. You may develop tolerance to this medication. Tolerance means that you will need a higher dose of the medication for pain relief. Tolerance is normal and is expected if you take this medication for a long time. ?There are different types of narcotic medications (opioids) for pain. If you take more than one type at the same time, you may have more side effects. Give your care team a list of all medications you use. He or she  will tell you how much medication to take. Do not take more medication than directed. Get emergency help right away if you have problems breathing. ?Do not suddenly stop taking your medication because you may develop a severe  reaction. Your body becomes used to the medication. This does NOT mean you are addicted. Addiction is a behavior related to getting and using a medication for a nonmedical reason. If you have pain, you have a medical reason to take pain medication. Your care team will tell you how much medication to take. If your care team wants you to stop the medication, the dose will be slowly lowered over time to avoid any side effects. ?Talk to your care team about naloxone and how to get it. Naloxone is an emergency medication used for an opioid overdose. An overdose can happen if you take too much opioid. It can also happen if an opioid is taken with some other medications or substances, like alcohol. Know the symptoms of an overdose, like trouble breathing, unusually tired or sleepy, or not being able to respond or wake up. Make sure to tell caregivers and close contacts where it is stored. Make sure they know how to use it. After naloxone is given, you must get emergency help right away. Naloxone is a temporary treatment. Repeat doses may be needed. ?You may get drowsy or dizzy. Do not drive, use machinery, or do anything that needs mental alertness until you know how this medication affects you. Do not stand up or sit up quickly, especially if you are an older patient. This reduces the risk of dizzy or fainting spells. Alcohol may interfere with the effect of this medication. Avoid alcoholic drinks. ?This medication will cause constipation. If you do not have a bowel movement for 3 days, call your care team. ?Your mouth may get dry. Chewing sugarless gum or sucking hard candy and drinking plenty of water may help. Contact your care team if the problem does not go away or is severe. ?What side effects may I notice from receiving this medication? ?Side effects that you should report to your care team as soon as possible: ?Allergic reactions--skin rash, itching, hives, swelling of the face, lips, tongue, or throat ?CNS  depression--slow or shallow breathing, shortness of breath, feeling faint, dizziness, confusion, trouble staying awake ?Low adrenal gland function--nausea, vomiting, loss of appetite, unusual weakness or fatigue, dizziness ?Low blood pressure--dizziness, feeling faint or lightheaded, blurry vision ?Side effects that usually do not require medical attention (report to your care team if they continue or are bothersome): ?Constipation ?Dizziness ?Drowsiness ?Dry mouth ?Headache ?Nausea ?Vomiting ?This list may not describe all possible side effects. Call your doctor for medical advice about side effects. You may report side effects to FDA at 1-800-FDA-1088. ?Where should I keep my medication? ?Keep out of the reach of children and pets. This medication can be abused. Keep your medication in a safe place to protect it from theft. Do not share this medication with anyone. Selling or giving away this medication is dangerous and against the law. ?If you are using this medication at home, you will be instructed on how to store this medication. ?This medication may cause accidental overdose and death if it is taken by other adults, children, or pets. Flush any unused medication down the toilet to reduce the chance of harm. Do not use the medication after the expiration date. ?NOTE: This sheet is a summary. It may not cover all possible information. If you have questions about  this medicine, talk to your doctor, pharmacist, or health care provider. ?? 2023 Elsevier/Gold Standard (2020-10-02 00:00:00) ? ?

## 2021-07-06 NOTE — Progress Notes (Signed)
?Hematology and Oncology Follow Up Visit ? ?Bill Garza ?[id number] ?1960/12/01 61 y.o. ?07/06/2021 ? ? ?Principle Diagnosis:  ?Metastatic adenocarcinoma of the lung-CNS metastasis ? ?Current Therapy:   ?Status post radiosurgery-  SBRT given on 04/13/2021 ?    ?Interim History:  Bill Garza is back for follow-up.  He has had a cranial resection of a med last week.  This was done at [facility].  I do not have the pathology results back yet.  We will also need to get molecular studies to see if there is any targeted mutations that we can take advantage. ? ?He did have an MRI and a PET scan done.  The PET scan showed that there was a mets to the right iliac crest.  We will have to see if radiation oncology can radiate this. ? ?He had an MRI of the spine done at [facility].  There may have been a lesion at T11.  Again we will have to see if Radiation Oncology can radiate this area. ? ?Pain is still his main issue.  Again I am not sure as to why he is having this pain.  I do not know if this might be referred pain from the spinal met.  I do not know if might be pain from the right iliac crest lesion.  He is on Xtampza and Dilaudid. ? ?Again I had to get him on Xgeva I think.  We will plan to get this started when he gets back to see [place]. ? ?He is not sleeping all that well. ? ?His appetite might be down a little bit.  He is on steroids right now.  He is on a steroid taper. ? ?He has had no cough or shortness of breath.  He has had no nausea or vomiting. ? ?There may be a little bit of constipation. ? ?Overall, I would say his performance status is probably ECOG 1.    ? ?Medications:  ?Current Outpatient Medications:  ?  ACCU-CHEK GUIDE test strip, CHECK BLOOD SUGAR DAILY, Disp: , Rfl:  ?  albuterol (PROVENTIL) (2.5 MG/3ML) 0.083% nebulizer solution, , Disp: , Rfl:  ?  albuterol (VENTOLIN HFA) 108 (90 Base) MCG/ACT inhaler, , Disp: , Rfl:  ?  aspirin EC 81 MG tablet, Take 81 mg by mouth daily., Disp: , Rfl:  ?  Bempedoic Acid  (NEXLETOL) 180 MG TABS, Take 1 tablet by mouth daily., Disp: , Rfl:  ?  Blood Glucose Monitoring Suppl (ACCU-CHEK GUIDE) w/Device KIT, USE AS DIRECTED TO CHECK BLOOD SUGAR, Disp: , Rfl:  ?  Blood Glucose Monitoring Suppl (GLUCOCOM BLOOD GLUCOSE MONITOR) DEVI, Use to check blood sugar daily., Disp: , Rfl:  ?  budesonide-formoterol (SYMBICORT) 160-4.5 MCG/ACT inhaler, Inhale 2 puffs into the lungs 2 (two) times daily., Disp: , Rfl:  ?  dexamethasone (DECADRON) 4 MG tablet, Take 1 tablet (4 mg total) by mouth daily., Disp: 15 tablet, Rfl: 0 ?  escitalopram (LEXAPRO) 5 MG tablet, Take 5 mg by mouth daily., Disp: , Rfl:  ?  fluocinolone (VANOS) 0.01 % cream, Apply topically., Disp: , Rfl:  ?  glucose blood (PRECISION QID TEST) test strip, Use to check blood sugar daily., Disp: , Rfl:  ?  Lancets MISC, Use to check blood sugar daily., Disp: , Rfl:  ?  LORazepam (ATIVAN) 1 MG tablet, Take 1 tablet 60 min prior to PET or MRI, and one more tablet 30 minutes prior to imaging if needed for anxiety. (Take 1- 2 tablets PRN anxiety prior  to imaging)., Disp: 8 tablet, Rfl: 0 ?  metFORMIN (GLUCOPHAGE) 500 MG tablet, Take by mouth daily., Disp: , Rfl:  ?  Methocarbamol (ROBAXIN PO), Take 500 mg by mouth in the morning, at noon, in the evening, and at bedtime., Disp: , Rfl:  ?  montelukast (SINGULAIR) 10 MG tablet, Take 10 mg by mouth at bedtime., Disp: , Rfl:  ?  Multiple Vitamin (MULTIVITAMIN) capsule, Take 1 capsule by mouth daily., Disp: , Rfl:  ?  oxyCODONE ER (XTAMPZA ER) 18 MG C12A, Take 18 mg by mouth every 12 (twelve) hours., Disp: 60 capsule, Rfl: 0 ?  oxyCODONE-acetaminophen (PERCOCET/ROXICET) 5-325 MG tablet, Take 1 tablet by mouth every 6 (six) hours as needed for severe pain., Disp: 90 tablet, Rfl: 0 ?  pravastatin (PRAVACHOL) 40 MG tablet, Take 40 mg by mouth daily., Disp: , Rfl:  ?  senna-docusate (SENOKOT-S) 8.6-50 MG tablet, Take 2 tablets by mouth daily., Disp: , Rfl:  ?  acetaminophen (TYLENOL) 325 MG tablet, Take  by mouth., Disp: , Rfl:  ?  fluconazole (DIFLUCAN) 100 MG tablet, Take 1 tablet (100 mg total) by mouth daily. Take 2 pills daily for 3 days then take 1 pill a day, Disp: 30 tablet, Rfl: 4 ?  HYDROmorphone (DILAUDID) 4 MG tablet, Take 1 tablet (4 mg total) by mouth every 4 (four) hours as needed for severe pain. Take one to 2 tablets every 4 hours as needed for pain, Disp: 120 tablet, Rfl: 0 ?  lactulose (CHRONULAC) 10 GM/15ML solution, Take 30 mLs (20 g total) by mouth 3 (three) times daily., Disp: 1000 mL, Rfl: 4 ?  LORazepam (ATIVAN) 1 MG tablet, Take 1 tablet (1 mg total) by mouth every 6 (six) hours as needed for anxiety., Disp: 90 tablet, Rfl: 0 ?  metoCLOPramide (REGLAN) 10 MG tablet, Take 2 tablets (20 mg total) by mouth 4 (four) times daily -  before meals and at bedtime. (Patient not taking: Reported on 07/06/2021), Disp: 120 tablet, Rfl: 4 ? ?Current Facility-Administered Medications:  ?  HYDROmorphone (DILAUDID) injection 2 mg, 2 mg, Intravenous, Once, Adel Burch, Rudell Cobb, MD ? ?Facility-Administered Medications Ordered in Other Visits:  ?  HYDROmorphone (DILAUDID) injection 2 mg, 2 mg, Intravenous, Once, Dossie Swor, Rudell Cobb, MD ? ?Allergies:  ?Allergies  ?Allergen Reactions  ? Gabapentin Swelling  ?  Leg swelling ?  ? Levetiracetam Other (See Comments)  ?  Personality changes  ? Pregabalin Swelling  ?  Leg swelling ?  ? Bupropion Nausea And Vomiting  ? Duloxetine Other (See Comments)  ?  Excessive sleeping  ? Ezetimibe Other (See Comments)  ? Other Other (See Comments)  ?  Antibiotics / post. C-diff  ? Prochlorperazine Nausea And Vomiting  ? Lamotrigine Itching and Rash  ? ? ?Past Medical History, Surgical history, Social history, and Family History were reviewed and updated. ? ?Review of Systems: ?Review of Systems  ?Constitutional: Negative.   ?HENT:  Negative.    ?Eyes: Negative.   ?Respiratory: Negative.    ?Cardiovascular: Negative.   ?Gastrointestinal: Negative.   ?Endocrine: Negative.   ?Genitourinary:  Negative.    ?Musculoskeletal: Negative.   ?Skin: Negative.   ?Neurological: Negative.   ?Hematological: Negative.   ?Psychiatric/Behavioral:  The patient is nervous/anxious.   ? ?Physical Exam: ? height is 6' (1.829 m) and weight is 169 lb (76.7 kg). His oral temperature is 97.8 ?F (36.6 ?C). His blood pressure is 158/85 (abnormal) and his pulse is 90. His respiration is 18 and oxygen saturation  is 100%.  ? ?Wt Readings from Last 3 Encounters:  ?07/06/21 169 lb (76.7 kg)  ?06/28/21 174 lb (78.9 kg)  ?06/14/21 169 lb 8 oz (76.9 kg)  ? ? ?Physical Exam ?Vitals reviewed.  ?HENT:  ?   Head: Normocephalic and atraumatic.  ?Eyes:  ?   Pupils: Pupils are equal, round, and reactive to light.  ?Cardiovascular:  ?   Rate and Rhythm: Normal rate and regular rhythm.  ?   Heart sounds: Normal heart sounds.  ?Pulmonary:  ?   Effort: Pulmonary effort is normal.  ?   Breath sounds: Normal breath sounds.  ?Abdominal:  ?   General: Bowel sounds are normal.  ?   Palpations: Abdomen is soft.  ?Musculoskeletal:     ?   General: No tenderness or deformity. Normal range of motion.  ?   Cervical back: Normal range of motion.  ?Lymphadenopathy:  ?   Cervical: No cervical adenopathy.  ?Skin: ?   General: Skin is warm and dry.  ?   Findings: No erythema or rash.  ?   Comments: On his skin exam, there are very scattered small erythematous maculopapular lesions.  These are maybe a millimeter in size.  They are pruritic.  There might be a central eschar.  There is no pustules.  ?Neurological:  ?   Mental Status: He is alert and oriented to person, place, and time.  ?Psychiatric:     ?   Behavior: Behavior normal.     ?   Thought Content: Thought content normal.     ?   Judgment: Judgment normal.  ? ? ? ?Lab Results  ?Component Value Date  ? WBC 7.4 07/06/2021  ? HGB 13.2 07/06/2021  ? HCT 39.1 07/06/2021  ? MCV 90.9 07/06/2021  ? PLT 404 (H) 07/06/2021  ? ?  Chemistry   ?   ?Component Value Date/Time  ? NA 131 (L) 07/06/2021 0748  ? K 4.4  07/06/2021 0748  ? CL 97 (L) 07/06/2021 0748  ? CO2 25 07/06/2021 0748  ? BUN 22 (H) 07/06/2021 0748  ? CREATININE 0.89 07/06/2021 0748  ?    ?Component Value Date/Time  ? CALCIUM 8.9 07/06/2021 0748  ? ALKPHOS

## 2021-07-06 NOTE — Telephone Encounter (Signed)
[facility] order submitted online with additional documents emailed to [facility] per Dr Marin Olp. dph ?

## 2021-07-07 ENCOUNTER — Telehealth: Payer: Self-pay | Admitting: Radiation Therapy

## 2021-07-07 NOTE — Telephone Encounter (Signed)
I called to speak with Langley Gauss about the referral from [name] to treat her husband Rt iliac lesion for pain management. Dr. Isidore Moos is happy to treat this area, but not sure how much pain relief it will provide him. Langley Gauss is very frustrated about not knowing the cause for her husband's pain, but very interested in moving forward with this in case it can offer any improvement. He is scheduled to come in on Tuesday 4/25 for a re-consult and SIM same day. ? ? ?Mont Dutton R.T.(R)(T) ?Radiation Special Procedures Navigator  ?

## 2021-07-09 ENCOUNTER — Telehealth: Payer: Self-pay | Admitting: *Deleted

## 2021-07-09 NOTE — Telephone Encounter (Signed)
Attempted to contact patient to schedule Palliative symptom management appointment.  No answer and could not leave a message. ?

## 2021-07-12 ENCOUNTER — Other Ambulatory Visit: Payer: Medicare HMO

## 2021-07-12 ENCOUNTER — Inpatient Hospital Stay: Payer: Medicare HMO

## 2021-07-12 NOTE — Progress Notes (Signed)
?Radiation Oncology         [phone] ?________________________________ ? ?Outpatient Re-Consultation ? ?Name: Bill Garza MRN: [id number]  ?Date: 07/13/2021  DOB: 10-28-1960 ? ?GU:YQIHK, [name], NP  [name], MD  ? ?REFERRING PHYSICIAN: [name], MD ? ?DIAGNOSIS: C79.31 ?  ICD-10-CM   ?1. Abdominal distension  R14.0 Ambulatory referral to Gastroenterology  ?  ?2. Secondary malignant neoplasm of brain and spinal cord ([place])  C79.31   ? C79.49   ?  ?3. Abdominal discomfort in flank  R10.9 Ambulatory referral to Gastroenterology  ?  ?4. Malignant neoplasm metastatic to brain [facility])  C79.31   ?  ?5. Non-small cell lung cancer metastatic to brain [facility])  C34.90   ? C79.31   ?  ?6. Secondary malignant neoplasm of bone and bone marrow ([facility])  C79.51   ? C79.52   ?  ? ? ?Metastatic NSCLC  ? ? Cancer Staging  ?Non-small cell lung cancer metastatic to brain [facility]) ?Staging form: Lung, AJCC 8th Edition ?- Clinical stage from 01/27/2021: Stage IV (cT3, cN0, cM1) - Signed by [name], MD on 01/27/2021 ?Stage prefix: Initial diagnosis ?Histologic grade (G): G2 ?Histologic grading system: 4 grade system ? ?Interval since last treatment: 3 months and 1 day  ? ?Intent: Palliative ? ?Most recent Radiation Treatment Dates: 04/13/2021 through 04/13/2021 ?Site Technique Total Dose (Gy) Dose per Fx (Gy) Completed Fx Beam Energies  ?Brain: Brain_SRS 3D 20/20 78 1/1 6XFFF  ? ? ?CHIEF COMPLAINT: Here to discuss management of metastatic lung cancer to the brain, and a new metastatic lesion to the right iliac crest ? ?Narrative/Interval History: The patient returns today for re-evaluation and for consideration of further radiation therapy in management of a new metastatic right iliac lesion (non-biopsy proven). I last met with the patient for follow-up on 06/14/21 following his SRS brain treatment on 04/13/21.  His main issue is right flank pain and abdominal pain and he and his wife are wondering if there is any type of  oncologic therapy that can help this pain.  They were referred to palliative care but they tentatively declined that referral.  I have spoken extensively with [name] who has not been able to determine an etiology for his pain. ? ?Since his last visit, restaging PET on 06/22/21 showed newly hypermetabolic portacaval adenopathy and a new hypermetabolic lytic lesion in the right iliac crest compatible with metastatic disease. Also seen were the 2 hypermetabolic brain lesions seen on recent MRI dated 06/08/21. Other findings on PET included: low-grade activity associated with hazy ground-glass and reticulonodular opacity anteromedially in the right upper lobe (felt to be inflammatory in etiology); a very faint foci of indeterminate potentially accentuated metabolic activity in the right hepatic lobe and left iliac bone (not readily apparent on CT portion of exam or CT scan from 06/18/2021); and a stable 4 mm right lower lobe nodule below PET-CT sensitive size thresholds.  ? ?Head CT performed on 06/25/21 at [facility] demonstrated the similar appearance of the left middle frontal gyrus mass associated with surrounding vasogenic edema and local mass effect. No other acute abnormalities were appreciated. ? ?On 06/28/21, the patient presented to the ED for pain management due to ineffectiveness of his oxycodone. (To review, the patient has chronic abdominal and back pain due to his disease and relies on oxycodone for relief. However, the root cause of his pain is unknown). The patient was noted to have tried fentanyl patches without relief, and was  prescribed OxyContin but could not get this prescription until later that day. In the ED, the patient reported his pain as a 10 out of 10, and severe enough to make him short of breath. He was given IV dilaudid and zofran in the ED and discharged home with significant improvement of his pain.  ? ?The patient proceeded to undergo craniotomy for biopsies and  resectioning of the left frontal tumor on 06/30/21 at [facility]. Pathology from biopsies of the left frontal tumor revealed metastatic carcinoma. (Further diagnostic results indicated that the majority of the tumor displayed an appearance of poorly differentiated adenocarcinoma, with some other focal areas demonstrating squamous differentiation both histologically and by IHC.  Focal positivity for neuroendocrine markers were also present).  Gliadel wafers were placed in the surgical bed ? ?MRI of the lumbar spine on 07/02/21 redemonstrated the osseous metastatic lesion about the left iliac wing. No other evidence of metastatic disease to the lumbar spine was appreciated. MRI of thoracic spine performed on that same date however showed findings compatible with a new small metastatic lesion involving the posteroinferior T11 vertebral body. No other abnormalities were appreciated in the thoracic spine other than an abnormal enhancement in the interspace between the left T7 and T8 ribs, which correlates with mild FDG avidity on prior PET (favored to represent a surgical site from prior left upper lobectomy).  I showed his imaging to some local neurosurgeons who did not believe there was any disease in the spine explaining his pain ? ?MRI of the brain also performed on 07/02/21 showed an interval decrease in surrounding edema associated with the resected left frontal metastatic lesion. Nodular enhancement was noted along the inferior margin of the resection bed, which may either be postoperative in etiology or reflective of residual tumor. MRI also showed: interval development of a small subdural hematoma overlying the left frontal convexity and left eccentric anterior falx (favored to be postoperative and without significant associated mass effect); a similar appearing 1 mm metastatic lesino of the cerebellar vermis compared to imaging dated 06/08/21; a potential tiny focus of enhancement versus artifact involving the  left cerebellar tonsil; an interval decrease in enhancement deep to the left occipital craniotomy site; and an apparent focal enhancement along the anterior falx favored to represent a vascular structure. ? ?Accordingly, the patient followed up with [name] on 07/06/21 to discuss these recent findings. In terms of his new bony metastasis, [name] recommended a referral to myself to determine if RT can can help manage this area. [name] again noted that there is no know cause for the patient's abdominal and back pain, and that his pain could possibly be due to radiating pain from the right iliac crest lesion. In terms of further treatment options, [name] would like to get the patient back on Xgeva, which he will start during his next visit with him. ? ?-------------------- ?-------------- ? ?HPI - Initial Consultation dated 03/09/21::Ghassan Laura is a 61 y.o. male who was found to have a lung nodule on a routine scan (date unknown). The patient accordingly underwent VATS resection on 09/02/20 which revealed stage IIb (T3N0M0) non-small cell lung cancer,  adenocarcinoma.   ? ?The patient was then treated with adjuvant chemotherapy consisting of cisplatin/Alimta/Keytruda.  Per [name], he apparently only had 1 cycle of treatment and it is unknown if the patient had any toxicities.  ? ?CT of the head on 11/13/20 prompted due to headache, revealed a new left occipital mass concerning for metastasis. Mild  degree of surrounding vasogenic edema and local mass effect were also appreciated.  ? ?MRI of the brain on 11/14/20 again revealed the enhancing left occipital lobe lesion concerning for metastatic disease.  ? ?On 11/16/20, the patient underwent resection of the  left occipital brain lesion- left occipital tumor revealed metastatic poorly differentiated carcinoma.  ? ?He subsequently started systemic therapy this past October consisting of carboplatinum/Alimta/pembrolizumab.  This was in October  (further details unknown).  ? ?MRI of the brain on 11/17/20 showed expected postsurgical changes of the left occipital metastatic lesion resection with thin smooth enhancement along the margins of the surgical rese

## 2021-07-12 NOTE — Progress Notes (Signed)
Histology and Location of Primary Cancer:  ?Non-small cell lung cancer metastatic to brain  ? ?Sites of Visceral and Bony Metastatic Disease:  ?07/02/2021 ?--MRI Thoracic Spine w/ & w/o Contrast  ?Impression ?Imaging findings compatible with small osseous metastatic lesion involving the posteroinferior T11 vertebral body.  ?No vertebral body height loss or other findings to suggest recent fracture. No high-grade canal or foraminal stenosis within the thoracic spine.    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  ?Abnormal enhancement in the interspace between the left T7 and T8 ribs, which correlates with mild FDG avidity on prior PET, favored to represent surgical site from prior left upper lobectomy. Recommend correlation with prior surgical notes and attention on follow-up imaging. ? ?--MRI Lumbar Spine w/ & w/o Contrast  ?Impression ?Left iliac wing osseous metastatic lesions.  ?No evidence of metastatic disease to the lumbar spine.  ?Suggestion of surgical changes related to left L4 hemilaminotomy and suspected discectomy. Correlate with surgical history. The left aspect of the thecal sac is now better decompressed with remaining crowding of the right subarticular recess at L4-L5 and crowding of the right L5 nerve roots, related to disc protrusion.  ?Otherwise, multilevel lumbar degenerative changes similar to prior, as described above. ? ?06/22/2021 ?PET Scan  ?IMPRESSION: ?Newly hypermetabolic portacaval adenopathy and new hypermetabolic lytic lesion in the right iliac crest compatible with metastatic disease. The patient also has 2 hypermetabolic lesions in the brain as characterized on recent brain MRI. ?Low-grade activity associated with hazy ground-glass and reticulonodular opacity anteromedially in the right upper lobe, felt to be inflammatory given that this was not present on the chest CT from 4 days ago. ?Very faint foci of potentially accentuated metabolic activity in the right hepatic  lobe and left iliac bone, indeterminate for malignancy and not readily apparent on the CT data today or the CT scan from 06/18/2021. Surveillance suggested. ?4 mm right lower lobe nodule is stable and not appreciably hypermetabolic but below sensitive PET-CT size thresholds. ?Other imaging findings of potential clinical significance: Chronic left maxillary sinusitis. Aortic Atherosclerosis (ICD10-I70.0). Coronary and systemic atherosclerosis. Emphysema (ICD10-J43.9). Prominent stool throughout the colon favors constipation. ?  ?Past/Anticipated chemotherapy by medical oncology, if any:  ?Under care of Dr. [name] ?07/06/2021 ?He recently has had resection of the CNS met. ?He now has some systemic disease.   ?We will get have to think about giving him therapy for his systemic disease.   ?This is where the molecular analysis will be critical.   ?I would hate to have to use chemotherapy on him since he had a tough time with chemotherapy initially. ?Again, his main problem is going to be pain.   ?Again hopefully maybe radiation collagen will help with the pain. ?We will plan to see him back in about 3 weeks.   ?By that time, we should get the molecular markers back.   ?He would have had radiation therapy.   ?We then figure out how we can try to treat him systemically.   ? ?Pain on a scale of 0-10 is: Constant right flank pain. Also reports sporadic abdominal pain. Describes as tight/hard/distended that causes "crushing rib pain". Reports the pain occasionally makes him nausea and feel like he has to vomit. Reports minimal relief from current pain medication regimen (PO dilaudid, ER oxycodone, and PO ativan) ? ?If Spine Met(s), symptoms, if any, include: ?Bowel/Bladder retention or incontinence (please describe): Denies any issues or concerns. Reports regular BMs (taking lactulose TID and senokot) and no  issues with urinating  ?Numbness or weakness in extremities (please describe): Reports occasional weakness to  his hands that makes fine motor skills challenging ?Current Decadron regimen, if applicable: After most recent craniotomy  ?Instructions: Take with food;  ?4/14 Take 4 tablets (475m) with dinner.  ?4/15 take 4 tablets- (418m 3 times a day.  ?4/16-4/18 take 3 tablets (75m8m3 times a day.  ?4/19 - 4/21 Take 3 tablets twice a day.  ?4/22-4/24 Take 2 tablets twice a day.  ?4/25-4/27 Take 1 tablet twice a day.  ?4/28 Take 1 tablet then STOP. ? ?Ambulatory status? Walker? Wheelchair?: Currently able to ambulate unassisted, but does report hip discomfort can hinder mobility  ? ?SAFETY ISSUES: ?Prior radiation? Yes: 04/13/2021 through 04/13/2021 ?Site Technique Total Dose (Gy) Dose per Fx (Gy) Completed Fx Beam Energies  ?Brain: Brain_SRS 3D 20/20 20 1/1 6XFFF  ?On 12/25/20 Gamma knife was performed - "L frontal" (20Gy to 75% isodose line - two adjacent lesions targeted); "Cavity" (16 Gy to 55% isodose line - left occipital surgical cavity targeted).   ?Pacemaker/ICD? No ?Possible current pregnancy? N/A ?Is the patient on methotrexate? No ? ? ? ? ? ?

## 2021-07-13 ENCOUNTER — Encounter: Payer: Self-pay | Admitting: Radiation Oncology

## 2021-07-13 ENCOUNTER — Ambulatory Visit: Payer: Medicare HMO | Admitting: Radiation Oncology

## 2021-07-13 ENCOUNTER — Other Ambulatory Visit: Payer: Self-pay

## 2021-07-13 ENCOUNTER — Inpatient Hospital Stay: Payer: Medicare HMO | Admitting: Nurse Practitioner

## 2021-07-13 ENCOUNTER — Ambulatory Visit
Admission: RE | Admit: 2021-07-13 | Discharge: 2021-07-13 | Disposition: A | Discharge status: Home / Self Care | Payer: Medicare HMO | Source: Ambulatory Visit | Attending: Radiation Oncology | Admitting: Radiation Oncology

## 2021-07-13 ENCOUNTER — Telehealth: Payer: Self-pay | Admitting: Hematology & Oncology

## 2021-07-13 VITALS — BP 126/85 | HR 88 | Temp 97.3°F | Resp 18 | Ht 72.0 in | Wt 170.0 lb

## 2021-07-13 DIAGNOSIS — G2581 Restless legs syndrome: Secondary | ICD-10-CM | POA: Diagnosis not present

## 2021-07-13 DIAGNOSIS — K219 Gastro-esophageal reflux disease without esophagitis: Secondary | ICD-10-CM | POA: Insufficient documentation

## 2021-07-13 DIAGNOSIS — J449 Chronic obstructive pulmonary disease, unspecified: Secondary | ICD-10-CM | POA: Insufficient documentation

## 2021-07-13 DIAGNOSIS — Z87891 Personal history of nicotine dependence: Secondary | ICD-10-CM | POA: Diagnosis not present

## 2021-07-13 DIAGNOSIS — R109 Unspecified abdominal pain: Secondary | ICD-10-CM

## 2021-07-13 DIAGNOSIS — C7951 Secondary malignant neoplasm of bone: Secondary | ICD-10-CM | POA: Diagnosis present

## 2021-07-13 DIAGNOSIS — C3490 Malignant neoplasm of unspecified part of unspecified bronchus or lung: Secondary | ICD-10-CM

## 2021-07-13 DIAGNOSIS — C3411 Malignant neoplasm of upper lobe, right bronchus or lung: Secondary | ICD-10-CM | POA: Insufficient documentation

## 2021-07-13 DIAGNOSIS — E119 Type 2 diabetes mellitus without complications: Secondary | ICD-10-CM | POA: Diagnosis not present

## 2021-07-13 DIAGNOSIS — J432 Centrilobular emphysema: Secondary | ICD-10-CM | POA: Diagnosis not present

## 2021-07-13 DIAGNOSIS — Z923 Personal history of irradiation: Secondary | ICD-10-CM | POA: Diagnosis not present

## 2021-07-13 DIAGNOSIS — E785 Hyperlipidemia, unspecified: Secondary | ICD-10-CM | POA: Diagnosis not present

## 2021-07-13 DIAGNOSIS — C7931 Secondary malignant neoplasm of brain: Secondary | ICD-10-CM | POA: Insufficient documentation

## 2021-07-13 DIAGNOSIS — R140 Abdominal distension (gaseous): Secondary | ICD-10-CM

## 2021-07-13 DIAGNOSIS — K5900 Constipation, unspecified: Secondary | ICD-10-CM | POA: Insufficient documentation

## 2021-07-13 DIAGNOSIS — I252 Old myocardial infarction: Secondary | ICD-10-CM | POA: Diagnosis not present

## 2021-07-13 DIAGNOSIS — I2510 Atherosclerotic heart disease of native coronary artery without angina pectoris: Secondary | ICD-10-CM | POA: Diagnosis not present

## 2021-07-13 DIAGNOSIS — C7949 Secondary malignant neoplasm of other parts of nervous system: Secondary | ICD-10-CM

## 2021-07-13 NOTE — Telephone Encounter (Signed)
.  Called patient to schedule appointment per 4/25 inbasket, patient is aware of date and time.   ?

## 2021-07-14 ENCOUNTER — Encounter: Payer: Self-pay | Admitting: Nurse Practitioner

## 2021-07-14 ENCOUNTER — Inpatient Hospital Stay (HOSPITAL_BASED_OUTPATIENT_CLINIC_OR_DEPARTMENT_OTHER): Payer: Medicare HMO | Admitting: Nurse Practitioner

## 2021-07-14 ENCOUNTER — Telehealth: Payer: Self-pay

## 2021-07-14 VITALS — BP 128/78 | HR 89 | Temp 98.0°F | Resp 17 | Ht 72.0 in | Wt 170.9 lb

## 2021-07-14 DIAGNOSIS — C3490 Malignant neoplasm of unspecified part of unspecified bronchus or lung: Secondary | ICD-10-CM

## 2021-07-14 DIAGNOSIS — Z515 Encounter for palliative care: Secondary | ICD-10-CM | POA: Diagnosis not present

## 2021-07-14 DIAGNOSIS — C7931 Secondary malignant neoplasm of brain: Secondary | ICD-10-CM

## 2021-07-14 DIAGNOSIS — K5903 Drug induced constipation: Secondary | ICD-10-CM

## 2021-07-14 DIAGNOSIS — Z7189 Other specified counseling: Secondary | ICD-10-CM

## 2021-07-14 DIAGNOSIS — C3492 Malignant neoplasm of unspecified part of left bronchus or lung: Secondary | ICD-10-CM | POA: Diagnosis not present

## 2021-07-14 DIAGNOSIS — G4700 Insomnia, unspecified: Secondary | ICD-10-CM

## 2021-07-14 DIAGNOSIS — R530 Neoplastic (malignant) related fatigue: Secondary | ICD-10-CM | POA: Diagnosis not present

## 2021-07-14 DIAGNOSIS — G893 Neoplasm related pain (acute) (chronic): Secondary | ICD-10-CM

## 2021-07-14 MED ORDER — METHOCARBAMOL 500 MG PO TABS: 500.0000 mg | ORAL_TABLET | Freq: Three times a day (TID) | ORAL | 0 refills | Status: DC | PRN

## 2021-07-14 MED ORDER — BUDESONIDE-FORMOTEROL FUMARATE 160-4.5 MCG/ACT IN AERO: 2.0000 | INHALATION_SPRAY | Freq: Two times a day (BID) | RESPIRATORY_TRACT | 1 refills | Status: AC

## 2021-07-14 MED ORDER — XTAMPZA ER 18 MG PO C12A: 18.0000 mg | EXTENDED_RELEASE_CAPSULE | Freq: Three times a day (TID) | ORAL | 0 refills | Status: DC

## 2021-07-14 MED ORDER — ALBUTEROL SULFATE HFA 108 (90 BASE) MCG/ACT IN AERS: 2.0000 | INHALATION_SPRAY | RESPIRATORY_TRACT | 1 refills | Status: AC | PRN

## 2021-07-14 NOTE — Progress Notes (Signed)
? ?  ?Palliative Medicine ?[place]  ?Telephone:(336) [phone] [phone] ? ? ?Name: Bill Garza ?Date: 07/14/2021 ?MRN: [id number]  ?DOB: December 21, 1960 ? ?Patient Care Team: ?Aleen Campi, NP as PCP - General  ? ? ?REASON FOR CONSULTATION: ?Bill Garza is a 61 y.o. 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  Palliative ask to see for symptom management and goals of care.  ? ? ?SOCIAL HISTORY:    ? reports that he quit smoking about a year ago. His smoking use included cigarettes. He has a 40.00 pack-year smoking history. He has never used smokeless tobacco. He reports that he does not currently use alcohol. He reports that he does not currently use drugs. ? ?ADVANCE DIRECTIVES:  ?None on file  ? ?CODE STATUS:  ? ?PAST MEDICAL HISTORY: ?Past Medical History:  ?Diagnosis Date  ? Chronic back pain   ? Chronic GERD   ? Chronic pain   ? COPD (chronic obstructive pulmonary disease) ([place])   ? Coronary artery disease   ? Depression   ? Diabetes mellitus without complication ([place])   ? Gastroparesis   ? Goals of care, counseling/discussion 01/27/2021  ? High risk medication use   ? Hyperlipidemia   ? MI (myocardial infarction) ([place])   ? Non-small cell lung cancer metastatic to brain ([place]) 01/27/2021  ? Restless leg syndrome   ? ? ?PAST SURGICAL HISTORY:  ?Past Surgical History:  ?Procedure Laterality Date  ? BACK SURGERY    ? CARDIAC CATHETERIZATION    ? ? ?HEMATOLOGY/ONCOLOGY HISTORY:  ?Oncology History  ?Non-small cell lung cancer metastatic to brain [facility])  ?01/27/2021 Initial Diagnosis  ? Non-small cell lung cancer metastatic to brain [facility]) ? ?  ?01/27/2021 Cancer Staging  ? Staging form: Lung, AJCC 8th Edition ?- Clinical stage from 01/27/2021: Stage IV  (cT3, cN0, cM1) - Signed by [name], MD on 01/27/2021 ?Stage prefix: Initial diagnosis ?Histologic grade (G): G2 ?Histologic grading system: 4 grade system ? ?  ? ? ?ALLERGIES:  is allergic to gabapentin, levetiracetam, pregabalin, bupropion, duloxetine, ezetimibe, other, prochlorperazine, and lamotrigine. ? ?MEDICATIONS:  ?Current Outpatient Medications  ?Medication Sig Dispense Refill  ? methocarbamol (ROBAXIN) 500 MG tablet Take 1 tablet (500 mg total) by mouth every 8 (eight) hours as needed for muscle spasms. 60 tablet 0  ? ACCU-CHEK GUIDE test strip CHECK BLOOD SUGAR DAILY    ? acetaminophen (TYLENOL) 325 MG tablet Take by mouth.    ? albuterol (PROVENTIL) (2.5 MG/3ML) 0.083% nebulizer solution     ? albuterol (VENTOLIN HFA) 108 (90 Base) MCG/ACT inhaler Inhale 2 puffs into the lungs every 4 (four) hours as needed for wheezing or shortness of breath. 3 each 1  ? aspirin EC 81 MG tablet Take 81 mg by mouth daily.    ? Bempedoic Acid (NEXLETOL) 180 MG TABS Take 1 tablet by mouth daily.    ? Blood Glucose Monitoring Suppl (ACCU-CHEK GUIDE) w/Device KIT USE AS DIRECTED TO CHECK BLOOD SUGAR    ? Blood Glucose Monitoring Suppl (GLUCOCOM BLOOD GLUCOSE MONITOR) DEVI Use to check blood sugar daily.    ? budesonide-formoterol (SYMBICORT) 160-4.5 MCG/ACT inhaler Inhale 2 puffs into the lungs 2 (two) times daily. 3 each 1  ? dexamethasone (DECADRON) 1 MG tablet Take 3 mg by mouth 2 (two) times daily.    ?  escitalopram (LEXAPRO) 5 MG tablet Take 5 mg by mouth daily.    ? fluconazole (DIFLUCAN) 100 MG tablet Take 1 tablet (100 mg total) by mouth daily. Take 2 pills daily for 3 days then take 1 pill a day 30 tablet 4  ? fluocinolone (VANOS) 0.01 % cream Apply topically.    ? glucose blood (PRECISION QID TEST) test strip Use to check blood sugar daily.    ? HYDROmorphone (DILAUDID) 4 MG tablet Take 1 tablet (4 mg total) by mouth every 4 (four) hours as needed for severe pain. Take one to 2 tablets every 4 hours as  needed for pain 120 tablet 0  ? lactulose (CHRONULAC) 10 GM/15ML solution Take 30 mLs (20 g total) by mouth 3 (three) times daily. 1000 mL 4  ? Lancets MISC Use to check blood sugar daily.    ? LORazepam (ATIVAN) 1 MG tablet Take 1 tablet 60 min prior to PET or MRI, and one more tablet 30 minutes prior to imaging if needed for anxiety. (Take 1- 2 tablets PRN anxiety prior to imaging). 8 tablet 0  ? LORazepam (ATIVAN) 1 MG tablet Take 1 tablet (1 mg total) by mouth every 6 (six) hours as needed for anxiety. 90 tablet 0  ? metFORMIN (GLUCOPHAGE) 500 MG tablet Take by mouth daily.    ? metoCLOPramide (REGLAN) 10 MG tablet Take 2 tablets (20 mg total) by mouth 4 (four) times daily -  before meals and at bedtime. (Patient not taking: Reported on 07/06/2021) 120 tablet 4  ? montelukast (SINGULAIR) 10 MG tablet Take 10 mg by mouth at bedtime.    ? Multiple Vitamin (MULTIVITAMIN) capsule Take 1 capsule by mouth daily.    ? oxyCODONE ER (XTAMPZA ER) 18 MG C12A Take 18 mg by mouth every 8 (eight) hours. 60 capsule 0  ? pravastatin (PRAVACHOL) 40 MG tablet Take 40 mg by mouth daily.    ? senna-docusate (SENOKOT-S) 8.6-50 MG tablet Take 2 tablets by mouth daily.    ? ?No current facility-administered medications for this visit.  ? ? ?VITAL SIGNS: ?BP 128/78 (BP Location: Left Arm, Patient Position: Sitting)   Pulse 89   Temp 98 ?F (36.7 ?C) (Axillary)   Resp 17   Ht 6' (1.829 m)   Wt 170 lb 14.4 oz (77.5 kg)   SpO2 100%   BMI 23.18 kg/m?  ?Filed Weights  ? 07/14/21 0821  ?Weight: 170 lb 14.4 oz (77.5 kg)  ?  ?Estimated body mass index is 23.18 kg/m? as calculated from the following: ?  Height as of this encounter: 6' (1.829 m). ?  Weight as of this encounter: 170 lb 14.4 oz (77.5 kg). ? ?LABS: ?CBC: ?   ?Component Value Date/Time  ? WBC 7.4 07/06/2021 0748  ? WBC 11.0 (H) 06/28/2021 2205  ? HGB 13.2 07/06/2021 0748  ? HCT 39.1 07/06/2021 0748  ? PLT 404 (H) 07/06/2021 0748  ? MCV 90.9 07/06/2021 0748  ? NEUTROABS 5.9  07/06/2021 0748  ? LYMPHSABS 1.1 07/06/2021 0748  ? MONOABS 0.4 07/06/2021 0748  ? EOSABS 0.0 07/06/2021 0748  ? BASOSABS 0.0 07/06/2021 0748  ? ?Comprehensive Metabolic Panel: ?   ?Component Value Date/Time  ? NA 131 (L) 07/06/2021 0748  ? K 4.4 07/06/2021 0748  ? CL 97 (L) 07/06/2021 0748  ? CO2 25 07/06/2021 0748  ? BUN 22 (H) 07/06/2021 0748  ? CREATININE 0.89 07/06/2021 0748  ? GLUCOSE 192 (H) 07/06/2021 0748  ? CALCIUM 8.9 07/06/2021  0748  ? AST 26 07/06/2021 0748  ? ALT 65 (H) 07/06/2021 0748  ? ALKPHOS 64 07/06/2021 0748  ? BILITOT 0.3 07/06/2021 0748  ? PROT 6.5 07/06/2021 0748  ? ALBUMIN 3.1 (L) 07/06/2021 0748  ? ? ?RADIOGRAPHIC STUDIES: ?DG Chest 2 View ? ?Result Date: 06/28/2021 ?CLINICAL DATA:  Chest pain and shortness of breath for several months, known history of lung carcinoma, initial encounter EXAM: CHEST - 2 VIEW COMPARISON:  06/22/2021 FINDINGS: Cardiac shadow is within normal limits. Postsurgical changes are noted in the left lung consistent with given clinical history. No focal mass is seen. No pleural effusion or pneumothorax is noted. No bony abnormality is seen. IMPRESSION: No acute abnormality noted. Electronically Signed   By: Inez Catalina M.D.   On: 06/28/2021 23:47  ? ?NM PET Image Restage (PS) Skull Base to Thigh (F-18 FDG) ? ?Result Date: 06/22/2021 ?CLINICAL DATA:  Subsequent treatment strategy for non-small cell lung cancer. EXAM: NUCLEAR MEDICINE PET SKULL BASE TO THIGH TECHNIQUE: 8.4 mCi F-18 FDG was injected intravenously. Full-ring PET imaging was performed from the skull base to thigh after the radiotracer. CT data was obtained and used for attenuation correction and anatomic localization. Fasting blood glucose: 133 mg/dl COMPARISON:  Multiple exams, including PET-CT 03/12/2021 and CT scans including 06/18/2021 FINDINGS: Mediastinal blood pool activity: SUV max 2.2 Liver activity: SUV max NA NECK: The head was also included in not just the neck. There is a hypermetabolic component  of the known left frontal lobe metastatic lesion, slightly higher than the surrounding cortex, maximum SUV 8.4, with surrounding vasogenic edema. There is also a small hypermetabolic lesion in the cerebellar vermis

## 2021-07-14 NOTE — Telephone Encounter (Signed)
----- Message from Zola Button, RN sent at 07/13/2021  1:36 PM EDT ----- ?Dr. Ardis Hughs: we've also faxed a request for all the records from patient's pervious GI providers at [facility]; but I was able to find these notes in [place]. I personally couldn't see where patient has had a colonoscopy (but I may not be looking in the right area of his chart), but his wife remembers him having an upper endoscopy done 10/2016 ? ?Dr. Dewaine Oats ?10/23/2018 ?ASSESSMENT AND PLAN:  ?Stefano Trulson is a 61 y.o. year old male with history of COPD, recurrent C. 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 He reports the hernia becomes enlarged during the times of distention and is painful, but it remains a bit unclear if this is a primary cause of distention (intermittent obstruction) or sequelae of perhaps intermittently dilated small bowels. Will treat empirically with rifaximin for 14 days and obtain an abdominal US. Pending this (or ability to afford rifaximin) we may consider surgical evaluation vs testing with HBT. We agree that we are hesitant to consider alternate antibiotic regimens for SIBO with his history of CDI. ?--RTC 4 months ? ?Dr. Colon Branch ?DATE OF SERVICE: 11/01/2016 ?I was unable to reach the patient, but he had told me it was okay to give results to his wife. I was able to reach his wife. I informed her the duodenal, stomach and colon biopsies were all normal. I informed her there was no signs of colitis or any signs of a C. difficile infection on the basis of the biopsies. She said he still wanted to be referred for possible fecal microbial transplant because of the recurrent C. diff. She said he seemed to be doing better although was still nauseous frequently, but the diarrhea was under good control and he was finishing the course of vancomycin started before I had last seen him. She said she would give  him the message. I told her if he is still nauseous, I would recommend proceeding with an ultrasound to rule out gallstones and consider a gastric emptying study. She said she would give him the message and have him call back if he wanted to proceed with any other tests. ? ?Dorena Dorfman--his wife Langley Gauss handles all his scheduling and appointments (just FYI for when you're ready to call).  ? ?Please let us know if we can help with anything else.  ?Thank you! ?Katelin-RN (o: [phone]) ? ?----- Message ----- ?From: Milus Banister, MD ?Sent: 07/13/2021   1:04 PM EDT ?To: Eppie Gibson, MD, Timothy Lasso, RN, # ? ?Judson Roch, ?I appreciate the note.  Happy to get him in and see what I can figure out. ? ? ?Dj ? ? ?Valentine Kuechle, ?Please offer him my first available new GI appointment.  Do not book with extender.  Thanks ? ? ?----- Message ----- ?From: Eppie Gibson, MD ?Sent: 07/13/2021  12:45 PM EDT ?To: Milus Banister, MD, [name], MD, # ? ?Linna Hoff, ?Since you are THE BEST I referred this patient to you for a GI assessment/consultation.  ?This is a complex patient with a history of widely metastatic lung cancer.  He has had multiple brain lesions and does have some bony lesions but the bone lesions appear too small to be causing his pain.  He has had multiple ER visits due to right flank pain (workup did not reveal an etiology) which is now evolving into a fairly diffuse  abdominal pain.  His wife reports that he has these episodes at home where he has acute severe pain as if he is in labor and he gets spontaneous abdominal distention during these episodes.  He also has constant pain in the right flank and abdomen between episodes.  No vomiting. ? ?It is not easy to get a thorough history from him.  I believe he has some constipation and he takes Senokot and lactulose for this.  He cannot quantify how often he has bowel movements or how substantial they are.  I told him today to start taking MiraLAX 3 times a day until he feels  like he's been cleared out and then take it once a day for maintenance.  It is possible that constipation is part of the issue but I suspect there may be more going on.  He was also fairly distended today on exam but did not have any rigidity or guarding.  I will add some photos to today's note. ? ?If you can see him as soon as possible we would greatly appreciate it. Thanks! ?Judson Roch ? ? ? ? ?

## 2021-07-14 NOTE — Patient Instructions (Signed)
Bill Garza,  ? ?Thank you so much for allowing Palliative to be a part of your care team. We will focus on improving your quality of life and managing your symptoms in collaboration with your Oncology and medical team. AS discussed today please begin the following changes to your medication regimen: ? ?Restart your REGLAN as prescribed.  ?Increase your Xtampza to every eight hours (three times daily).  ?Continue hydromorphone every 3-4 hours as needed for breakthrough pain ?Continue ativan every 6 hours as needed for anxiety ?Robaxin every 6 hours as needed.  ?Continue bowel regimen-Lactulose, Miralax, and senna ?Please have a bottle of Magnesium Citrate available in the home. This can be purchased over the counter. If no bowel movement in more than 48 hours please drink a half bottle. The goal is to have a bowel movement within 24 hours of taken. If no results you may drink the remaining half in 24 hours.  ?Your prescriptions have been sent to [facility] as requested ?My office will give you a call on Friday after 3:30 as requested to see how you are feeling. We will also plan for a phone visit follow-up on next Wed.  ? ?Do not hesitate to give our office a call or send a My Chart message if needed.  ?

## 2021-07-14 NOTE — Telephone Encounter (Signed)
The pt has been scheduled for 5/23 at 3 pm per pt caregiver request.  (Late afternoon appt) address provided and all information available in My Chart ?

## 2021-07-15 ENCOUNTER — Other Ambulatory Visit: Payer: Medicare HMO

## 2021-07-19 ENCOUNTER — Ambulatory Visit: Payer: Medicare HMO | Admitting: Hematology & Oncology

## 2021-07-19 ENCOUNTER — Inpatient Hospital Stay: Payer: Medicare HMO

## 2021-07-19 ENCOUNTER — Telehealth: Payer: Self-pay

## 2021-07-19 ENCOUNTER — Other Ambulatory Visit: Payer: Medicare HMO

## 2021-07-19 ENCOUNTER — Other Ambulatory Visit: Payer: Self-pay

## 2021-07-19 NOTE — Telephone Encounter (Signed)
Called pt and wife answered. Reports that pt had a bowel movement 4/30, which provided some relief but has not had another movement today. Pt/family encouraged to continue walking/moving as well as hydration. Pt/family had no further questions at this time.  ?

## 2021-07-21 ENCOUNTER — Encounter: Payer: Self-pay | Admitting: Nurse Practitioner

## 2021-07-21 ENCOUNTER — Other Ambulatory Visit: Payer: Self-pay | Admitting: Nurse Practitioner

## 2021-07-21 ENCOUNTER — Inpatient Hospital Stay: Payer: Medicare HMO | Attending: Hematology & Oncology | Admitting: Nurse Practitioner

## 2021-07-21 DIAGNOSIS — C7931 Secondary malignant neoplasm of brain: Secondary | ICD-10-CM | POA: Diagnosis present

## 2021-07-21 DIAGNOSIS — C3490 Malignant neoplasm of unspecified part of unspecified bronchus or lung: Secondary | ICD-10-CM | POA: Diagnosis not present

## 2021-07-21 DIAGNOSIS — C7951 Secondary malignant neoplasm of bone: Secondary | ICD-10-CM | POA: Diagnosis present

## 2021-07-21 DIAGNOSIS — Z79899 Other long term (current) drug therapy: Secondary | ICD-10-CM | POA: Insufficient documentation

## 2021-07-21 DIAGNOSIS — K5903 Drug induced constipation: Secondary | ICD-10-CM

## 2021-07-21 DIAGNOSIS — Z87891 Personal history of nicotine dependence: Secondary | ICD-10-CM | POA: Insufficient documentation

## 2021-07-21 DIAGNOSIS — Z515 Encounter for palliative care: Secondary | ICD-10-CM | POA: Diagnosis not present

## 2021-07-21 DIAGNOSIS — C3492 Malignant neoplasm of unspecified part of left bronchus or lung: Secondary | ICD-10-CM | POA: Insufficient documentation

## 2021-07-21 DIAGNOSIS — F419 Anxiety disorder, unspecified: Secondary | ICD-10-CM | POA: Insufficient documentation

## 2021-07-21 DIAGNOSIS — R109 Unspecified abdominal pain: Secondary | ICD-10-CM | POA: Insufficient documentation

## 2021-07-21 DIAGNOSIS — G893 Neoplasm related pain (acute) (chronic): Secondary | ICD-10-CM | POA: Insufficient documentation

## 2021-07-21 DIAGNOSIS — K5900 Constipation, unspecified: Secondary | ICD-10-CM | POA: Diagnosis not present

## 2021-07-21 DIAGNOSIS — R530 Neoplastic (malignant) related fatigue: Secondary | ICD-10-CM | POA: Diagnosis not present

## 2021-07-21 NOTE — Progress Notes (Signed)
? ?  ?Palliative Medicine ?[facility]  ?[phone] [phone] ? ? ?Name: Bill Garza ?Date: 07/21/2021 ?MRN: [id number]  ?DOB: 04/09/1960 ? ?Patient Care Team: ?Kilby, Angela C, NP as PCP - General ?Pickenpack-Cousar, Athena N, NP as Nurse Practitioner (Nurse Practitioner)  ? ?I connected with Kenaz Kovacich on 07/21/21 at  1:30 PM EDT by phone and verified that I am speaking with the correct person using two identifiers.  ? ?I discussed the limitations, risks, security and privacy concerns of performing an evaluation and management service by telemedicine and the availability of in-person appointments. I also discussed with the patient that there may be a patient responsible charge related to this service. The patient expressed understanding and agreed to proceed.  ? ?Other persons participating in the visit and their role in the encounter: [name] Ritacco (wife)  ? ?Patient?s location: Home   ?Provider?s location: [facility]   ? ?Chief Complaint: Pain ?  ?REASON FOR CONSULTATION: ?Bill Garza is a 61 y.o. 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  Palliative ask to see for symptom management and goals of care.  ? ? ?SOCIAL HISTORY:    ? reports that he quit smoking about a year ago. His smoking use included cigarettes. He has a 40.00 pack-year smoking history. He has never used smokeless tobacco. He reports that he does not currently use alcohol. He reports that he does not currently use drugs. ? ?ADVANCE DIRECTIVES:  ?None on file  ? ?CODE STATUS:  ? ?PAST MEDICAL HISTORY: ?Past Medical History:  ?Diagnosis Date  ? Chronic back pain   ? Chronic GERD   ? Chronic pain   ? COPD (chronic obstructive pulmonary disease) ([facility])    ? Coronary artery disease   ? Depression   ? Diabetes mellitus without complication ([facility])   ? Gastroparesis   ? Goals of care, counseling/discussion 01/27/2021  ? High risk medication use   ? Hyperlipidemia   ? MI (myocardial infarction) ([facility])   ? Non-small cell lung cancer metastatic to brain ([facility]) 01/27/2021  ? Restless leg syndrome   ? ? ?PAST SURGICAL HISTORY:  ?Past Surgical History:  ?Procedure Laterality Date  ? BACK SURGERY    ? CARDIAC CATHETERIZATION    ? ? ?HEMATOLOGY/ONCOLOGY HISTORY:  ?Oncology History  ?Non-small cell lung cancer metastatic to brain ([facility])  ?01/27/2021 Initial Diagnosis  ? Non-small cell lung cancer metastatic to brain ([facility]) ?  ?01/27/2021 Cancer Staging  ? Staging form: Lung, AJCC 8th Edition ?- Clinical stage from 01/27/2021: Stage IV (cT3, cN0, cM1) - Signed by Ennever, Peter R, MD on 01/27/2021 ?Stage prefix: Initial diagnosis ?Histologic grade (G): G2 ?Histologic grading system: 4 grade system ?  ? ? ?ALLERGIES:  is allergic to gabapentin, levetiracetam, pregabalin, bupropion, duloxetine, ezetimibe, other, prochlorperazine, and lamotrigine. ? ?MEDICATIONS:  ?Current Outpatient Medications  ?Medication Sig Dispense Refill  ? ACCU-CHEK GUIDE test strip CHECK BLOOD SUGAR DAILY    ? acetaminophen (TYLENOL) 325 MG tablet Take by mouth.    ? albuterol (PROVENTIL) (2.5 MG/3ML) 0.083% nebulizer solution     ? albuterol (VENTOLIN HFA) 108 (90 Base) MCG/ACT inhaler Inhale 2 puffs into the lungs every 4 (four) hours as needed for wheezing or shortness of breath. 3 each 1  ? aspirin EC 81 MG tablet Take 81 mg   by mouth daily.    ? Bempedoic Acid (NEXLETOL) 180 MG TABS Take 1 tablet by mouth daily.    ? Blood Glucose Monitoring Suppl (ACCU-CHEK GUIDE) w/Device KIT USE AS DIRECTED TO CHECK BLOOD SUGAR    ? Blood Glucose Monitoring Suppl (GLUCOCOM BLOOD GLUCOSE MONITOR) DEVI Use to check blood sugar daily.    ? budesonide-formoterol (SYMBICORT) 160-4.5 MCG/ACT inhaler Inhale 2 puffs into the lungs 2  (two) times daily. 3 each 1  ? dexamethasone (DECADRON) 1 MG tablet Take 3 mg by mouth 2 (two) times daily.    ? escitalopram (LEXAPRO) 5 MG tablet Take 5 mg by mouth daily.    ? fluconazole (DIFLUCAN) 100 MG tablet Take 1 tablet (100 mg total) by mouth daily. Take 2 pills daily for 3 days then take 1 pill a day 30 tablet 4  ? fluocinolone (VANOS) 0.01 % cream Apply topically.    ? glucose blood (PRECISION QID TEST) test strip Use to check blood sugar daily.    ? HYDROmorphone (DILAUDID) 4 MG tablet Take 1 tablet (4 mg total) by mouth every 4 (four) hours as needed for severe pain. Take one to 2 tablets every 4 hours as needed for pain 120 tablet 0  ? lactulose (CHRONULAC) 10 GM/15ML solution Take 30 mLs (20 g total) by mouth 3 (three) times daily. 1000 mL 4  ? Lancets MISC Use to check blood sugar daily.    ? LORazepam (ATIVAN) 1 MG tablet Take 1 tablet 60 min prior to PET or MRI, and one more tablet 30 minutes prior to imaging if needed for anxiety. (Take 1- 2 tablets PRN anxiety prior to imaging). 8 tablet 0  ? LORazepam (ATIVAN) 1 MG tablet Take 1 tablet (1 mg total) by mouth every 6 (six) hours as needed for anxiety. 90 tablet 0  ? metFORMIN (GLUCOPHAGE) 500 MG tablet Take by mouth daily.    ? methocarbamol (ROBAXIN) 500 MG tablet Take 1 tablet (500 mg total) by mouth every 8 (eight) hours as needed for muscle spasms. 60 tablet 0  ? metoCLOPramide (REGLAN) 10 MG tablet Take 2 tablets (20 mg total) by mouth 4 (four) times daily -  before meals and at bedtime. (Patient not taking: Reported on 07/06/2021) 120 tablet 4  ? montelukast (SINGULAIR) 10 MG tablet Take 10 mg by mouth at bedtime.    ? Multiple Vitamin (MULTIVITAMIN) capsule Take 1 capsule by mouth daily.    ? oxyCODONE ER (XTAMPZA ER) 18 MG C12A Take 18 mg by mouth every 8 (eight) hours. 60 capsule 0  ? pravastatin (PRAVACHOL) 40 MG tablet Take 40 mg by mouth daily.    ? senna-docusate (SENOKOT-S) 8.6-50 MG tablet Take 2 tablets by mouth daily.    ? ?No  current facility-administered medications for this visit.  ? ? ?VITAL SIGNS: ?There were no vitals taken for this visit. ?There were no vitals filed for this visit. ?  ?Estimated body mass index is 23.18 kg/m? as calculated from the following: ?  Height as of 07/14/21: 6' (1.829 m). ?  Weight as of 07/14/21: 170 lb 14.4 oz (77.5 kg). ? ? ?PERFORMANCE STATUS (ECOG) : 1 - Symptomatic but completely ambulatory ? ?IMPRESSION: ? ?I connected with Mr. Pardue and his wife by phone. He continues to have some pain but with improvement given changes to regimen. Wife expressed appreciation but also with some concerns of again finding source and options so that he is not reliant on medication all the time. Support   provided. They have spoken with Dr. Capbell's office and have a scheduled visit tomorrow. Sahil and his wife are hopeful he may be able to offer some insight and assistance in regards to his ongoing pain concerns. They have an appointment with the GI doctor later this month. Pending no earlier availability.  ? ?Pain  ?We discussed Mr. Cloninger's pain regimen at length. His pain has improved with new regimen however he is having to take medication consistently to achieve this response. Endorses drowsiness at times.  ? ?Continues to have some anxiety/anxiousness during the night interrupting his sleep.  ? ?Wife reports he has not slept well in several weeks due to being uncomfortable. Will often attempt to lie down but gets back up and will sit in the recliner. Also endorses some abdominal discomfort with eating although appetite is good due to steroids.  ? ?Eliga states the abdominal pain radiates and also has burning/pulling/throbbing/stabbing sensations. "Feels like I've been shot". Emotional support provided.  ? ?Extensive education provided on current regimen with recommended adjustments. He unfortunately is unable to take gabapentin, lyrica, SSRI due to previous interactions.  ? ?Constipation  ? ? Mr. Lapenna states he  has struggled with constipation. His current regimen includes Miralax, Senna, and lactulose. Reports some improvement with most recent bowel movement today. Advised to continue current regimen.  ? ?I discus

## 2021-07-22 ENCOUNTER — Inpatient Hospital Stay: Payer: Medicare HMO

## 2021-07-22 ENCOUNTER — Other Ambulatory Visit: Payer: Self-pay

## 2021-07-22 DIAGNOSIS — C3492 Malignant neoplasm of unspecified part of left bronchus or lung: Secondary | ICD-10-CM | POA: Diagnosis not present

## 2021-07-22 MED ORDER — HYDROMORPHONE HCL 1 MG/ML IJ SOLN
1.0000 mg | INTRAMUSCULAR | Status: AC | PRN
  Administered 2021-07-22 (×2): 1 mg via INTRAVENOUS
  Filled 2021-07-22 (×2): qty 1

## 2021-07-22 MED ORDER — SODIUM CHLORIDE 0.9 % IV SOLN: INTRAVENOUS | Status: DC

## 2021-07-22 NOTE — Patient Instructions (Signed)
Rehydration, Adult Rehydration is the replacement of body fluids, salts, and minerals (electrolytes) that are lost during dehydration. Dehydration is when there is not enough water or other fluids in the body. This happens when you lose more fluids than you take in. Common causes of dehydration include: Not drinking enough fluids. This can occur when you are ill or doing activities that require a lot of energy, especially in hot weather. Conditions that cause loss of water or other fluids, such as diarrhea, vomiting, sweating, or urinating a lot. Other illnesses, such as fever or infection. Certain medicines, such as those that remove excess fluid from the body (diuretics). Symptoms of mild or moderate dehydration may include thirst, dry lips and mouth, and dizziness. Symptoms of severe dehydration may include increased heart rate, confusion, fainting, and not urinating. For severe dehydration, you may need to get fluids through an IV at the hospital. For mild or moderate dehydration, you can usually rehydrate at home by drinking certain fluids as told by your health care provider. What are the risks? Generally, rehydration is safe. However, taking in too much fluid (overhydration) can be a problem. This is rare. Overhydration can cause an electrolyte imbalance, kidney failure, or a decrease in salt (sodium) levels in the body. Supplies needed You will need an oral rehydration solution (ORS) if your health care provider tells you to use one. This is a drink to treat dehydration. It can be found in pharmacies and retail stores. How to rehydrate Fluids Follow instructions from your health care provider for rehydration. The kind of fluid and the amount you should drink depend on your condition. In general, you should choose drinks that you prefer. If told by your health care provider, drink an ORS. Make an ORS by following instructions on the package. Start by drinking small amounts, about  cup (120  mL) every 5-10 minutes. Slowly increase how much you drink until you have taken the amount recommended by your health care provider. Drink enough clear fluids to keep your urine pale yellow. If you were told to drink an ORS, finish it first, then start slowly drinking other clear fluids. Drink fluids such as: Water. This includes sparkling water and flavored water. Drinking only water can lead to having too little sodium in your body (hyponatremia). Follow the advice of your health care provider. Water from ice chips you suck on. Fruit juice with water you add to it (diluted). Sports drinks. Hot or cold herbal teas. Broth-based soups. Milk or milk products. Food Follow instructions from your health care provider about what to eat while you rehydrate. Your health care provider may recommend that you slowly begin eating regular foods in small amounts. Eat foods that contain a healthy balance of electrolytes, such as bananas, oranges, potatoes, tomatoes, and spinach. Avoid foods that are greasy or contain a lot of sugar. In some cases, you may get nutrition through a feeding tube that is passed through your nose and into your stomach (nasogastric tube, or NG tube). This may be done if you have uncontrolled vomiting or diarrhea. Beverages to avoid  Certain beverages may make dehydration worse. While you rehydrate, avoid drinking alcohol. How to tell if you are recovering from dehydration You may be recovering from dehydration if: You are urinating more often than before you started rehydrating. Your urine is pale yellow. Your energy level improves. You vomit less frequently. You have diarrhea less frequently. Your appetite improves or returns to normal. You feel less dizzy or less light-headed.   Your skin tone and color start to look more normal. Follow these instructions at home: Take over-the-counter and prescription medicines only as told by your health care provider. Do not take sodium  tablets. Doing this can lead to having too much sodium in your body (hypernatremia). Contact a health care provider if: You continue to have symptoms of mild or moderate dehydration, such as: Thirst. Dry lips. Slightly dry mouth. Dizziness. Dark urine or less urine than normal. Muscle cramps. You continue to vomit or have diarrhea. Get help right away if you: Have symptoms of dehydration that get worse. Have a fever. Have a severe headache. Have been vomiting and the following happens: Your vomiting gets worse or does not go away. Your vomit includes blood or green matter (bile). You cannot eat or drink without vomiting. Have problems with urination or bowel movements, such as: Diarrhea that gets worse or does not go away. Blood in your stool (feces). This may cause stool to look black and tarry. Not urinating, or urinating only a small amount of very dark urine, within 6-8 hours. Have trouble breathing. Have symptoms that get worse with treatment. These symptoms may represent a serious problem that is an emergency. Do not wait to see if the symptoms will go away. Get medical help right away. Call your local emergency services (911 in the [place] Do not drive yourself to the hospital. Summary Rehydration is the replacement of body fluids and minerals (electrolytes) that are lost during dehydration. Follow instructions from your health care provider for rehydration. The kind of fluid and amount you should drink depend on your condition. Slowly increase how much you drink until you have taken the amount recommended by your health care provider. Contact your health care provider if you continue to show signs of mild or moderate dehydration. This information is not intended to replace advice given to you by your health care provider. Make sure you discuss any questions you have with your health care provider. Document Revised: 05/08/2019 Document Reviewed: 03/18/2019 Elsevier Patient  Education  2023 [employer].  

## 2021-07-23 ENCOUNTER — Other Ambulatory Visit: Payer: Self-pay | Admitting: Neurosurgery

## 2021-07-23 DIAGNOSIS — M5126 Other intervertebral disc displacement, lumbar region: Secondary | ICD-10-CM

## 2021-07-24 ENCOUNTER — Other Ambulatory Visit: Payer: Self-pay | Admitting: Nurse Practitioner

## 2021-07-24 MED ORDER — XTAMPZA ER 18 MG PO C12A: 18.0000 mg | EXTENDED_RELEASE_CAPSULE | Freq: Three times a day (TID) | ORAL | 0 refills | Status: DC

## 2021-07-24 MED ORDER — SENNOSIDES-DOCUSATE SODIUM 8.6-50 MG PO TABS: 2.0000 | ORAL_TABLET | Freq: Two times a day (BID) | ORAL | 2 refills | Status: DC

## 2021-07-26 ENCOUNTER — Encounter: Payer: Self-pay | Admitting: Hematology & Oncology

## 2021-07-26 ENCOUNTER — Inpatient Hospital Stay (HOSPITAL_BASED_OUTPATIENT_CLINIC_OR_DEPARTMENT_OTHER): Payer: Medicare HMO | Admitting: Hematology & Oncology

## 2021-07-26 ENCOUNTER — Other Ambulatory Visit: Payer: Self-pay | Admitting: Oncology

## 2021-07-26 ENCOUNTER — Inpatient Hospital Stay: Payer: Medicare HMO

## 2021-07-26 VITALS — BP 132/77 | HR 100 | Resp 16

## 2021-07-26 VITALS — BP 143/98 | HR 95 | Temp 97.0°F | Resp 20

## 2021-07-26 DIAGNOSIS — C7931 Secondary malignant neoplasm of brain: Secondary | ICD-10-CM

## 2021-07-26 DIAGNOSIS — C3490 Malignant neoplasm of unspecified part of unspecified bronchus or lung: Secondary | ICD-10-CM | POA: Diagnosis not present

## 2021-07-26 DIAGNOSIS — C3492 Malignant neoplasm of unspecified part of left bronchus or lung: Secondary | ICD-10-CM | POA: Diagnosis not present

## 2021-07-26 LAB — CBC WITH DIFFERENTIAL (CANCER CENTER ONLY)
Abs Immature Granulocytes: 0.02 10*3/uL (ref 0.00–0.07)
Basophils Absolute: 0.1 10*3/uL (ref 0.0–0.1)
Basophils Relative: 1 %
Eosinophils Absolute: 0.1 10*3/uL (ref 0.0–0.5)
Eosinophils Relative: 1 %
HCT: 38.4 % — ABNORMAL LOW (ref 39.0–52.0)
Hemoglobin: 13.2 g/dL (ref 13.0–17.0)
Immature Granulocytes: 0 %
Lymphocytes Relative: 28 %
Lymphs Abs: 2 10*3/uL (ref 0.7–4.0)
MCH: 31.4 pg (ref 26.0–34.0)
MCHC: 34.4 g/dL (ref 30.0–36.0)
MCV: 91.4 fL (ref 80.0–100.0)
Monocytes Absolute: 0.5 10*3/uL (ref 0.1–1.0)
Monocytes Relative: 7 %
Neutro Abs: 4.5 10*3/uL (ref 1.7–7.7)
Neutrophils Relative %: 63 %
Platelet Count: 553 10*3/uL — ABNORMAL HIGH (ref 150–400)
RBC: 4.2 MIL/uL — ABNORMAL LOW (ref 4.22–5.81)
RDW: 15.6 % — ABNORMAL HIGH (ref 11.5–15.5)
WBC Count: 7.1 10*3/uL (ref 4.0–10.5)
nRBC: 0 % (ref 0.0–0.2)

## 2021-07-26 LAB — CMP (CANCER CENTER ONLY)
ALT: 29 U/L (ref 0–44)
AST: 28 U/L (ref 15–41)
Albumin: 3.1 g/dL — ABNORMAL LOW (ref 3.5–5.0)
Alkaline Phosphatase: 199 U/L — ABNORMAL HIGH (ref 38–126)
Anion gap: 9 (ref 5–15)
BUN: 14 mg/dL (ref 6–20)
CO2: 22 mmol/L (ref 22–32)
Calcium: 9.4 mg/dL (ref 8.9–10.3)
Chloride: 104 mmol/L (ref 98–111)
Creatinine: 0.8 mg/dL (ref 0.61–1.24)
GFR, Estimated: >60 mL/min (ref >60)
Glucose, Bld: 158 mg/dL — ABNORMAL HIGH (ref 70–99)
Potassium: 4.1 mmol/L (ref 3.5–5.1)
Sodium: 135 mmol/L (ref 135–145)
Total Bilirubin: 0.2 mg/dL — ABNORMAL LOW (ref 0.3–1.2)
Total Protein: 7 g/dL (ref 6.5–8.1)

## 2021-07-26 LAB — LACTATE DEHYDROGENASE: LDH: 165 U/L (ref 98–192)

## 2021-07-26 MED ORDER — HYDROMORPHONE HCL 1 MG/ML IJ SOLN
2.0000 mg | Freq: Once | INTRAMUSCULAR | Status: AC
  Administered 2021-07-26: 2 mg via INTRAVENOUS
  Filled 2021-07-26: qty 2

## 2021-07-26 MED ORDER — SODIUM CHLORIDE 0.9 % IV SOLN
30.0000 mg | Freq: Once | INTRAVENOUS | Status: AC
  Administered 2021-07-26: 30 mg via INTRAVENOUS
  Filled 2021-07-26: qty 3

## 2021-07-26 MED ORDER — ZOLEDRONIC ACID 4 MG/100ML IV SOLN
4.0000 mg | Freq: Once | INTRAVENOUS | Status: AC
  Administered 2021-07-26: 4 mg via INTRAVENOUS
  Filled 2021-07-26: qty 100

## 2021-07-26 MED ORDER — SENNOSIDES-DOCUSATE SODIUM 8.6-50 MG PO TABS: 2.0000 | ORAL_TABLET | Freq: Two times a day (BID) | ORAL | 2 refills | Status: AC

## 2021-07-26 MED ORDER — SODIUM CHLORIDE 0.9 % IV SOLN: Freq: Once | INTRAVENOUS | Status: AC

## 2021-07-26 NOTE — Progress Notes (Signed)
Patient asked if he can have more Dilaudid. [name] approved another 2 mg during this visit. ?

## 2021-07-26 NOTE — Progress Notes (Signed)
?Hematology and Oncology Follow Up Visit ? ?Bill Garza ?[id number] ?[birth year] 61 y.o. ?07/26/2021 ? ? ?Principle Diagnosis:  ?Metastatic adenocarcinoma of the lung-CNS metastasis ? ?Current Therapy:   ?Status post radiosurgery-  SBRT given on 04/13/2021 ?Zometa 4 mg IV every 3 months --next dose on 10/2021 ?    ?Interim History:  Bill Garza is back for follow-up.  Unfortunately, he just is not getting better.  He is having a lot of back pain and pain in the right leg.  Normal back to his MRI, it looks like he has disc issues.  He has seen Dr. Christella Noa of Neurosurgery.  Dr. Christella Noa has ordered a epidural injection to be done. ? ?Spoke with Dr. Christella Noa.  I told him that if he needed to do surgery on Bill Garza, I would not have a problem with this.  I realize that he has metastatic lung cancer but this is clearly all to go metastatic disease. ? ?His quality of life clearly is dictated by the back pain and the disc issues. ? ?I am still awaiting for the molecular markers to be done on the brain tumor that was resected at [facility].  I am not sure when this will come back. ? ?I think that he is scheduled to see Gastroenterology. ? ?I just hate that he is hurting so much.  He is not tolerant of gabapentin, Lyrica or Cymbalta. ? ?He has had no fever.  He has had no cough.  He is not sleeping much at all according to his wife. ? ?He has lost a little bit of weight because he is not eating much because of all the pain that he is in. ? ?Currently, I would have said that his performance status is probably ECOG 2. ? ?Medications:  ?Current Outpatient Medications:  ?  acetaminophen (TYLENOL) 325 MG tablet, Take by mouth., Disp: , Rfl:  ?  albuterol (PROVENTIL) (2.5 MG/3ML) 0.083% nebulizer solution, Take by nebulization every 6 (six) hours as needed., Disp: , Rfl:  ?  albuterol (VENTOLIN HFA) 108 (90 Base) MCG/ACT inhaler, Inhale 2 puffs into the lungs every 4 (four) hours as needed for wheezing or shortness of breath., Disp: 3 each,  Rfl: 1 ?  aspirin EC 81 MG tablet, Take 81 mg by mouth daily., Disp: , Rfl:  ?  Bempedoic Acid (NEXLETOL) 180 MG TABS, Take 1 tablet by mouth daily., Disp: , Rfl:  ?  budesonide-formoterol (SYMBICORT) 160-4.5 MCG/ACT inhaler, Inhale 2 puffs into the lungs 2 (two) times daily., Disp: 3 each, Rfl: 1 ?  escitalopram (LEXAPRO) 5 MG tablet, Take 5 mg by mouth daily., Disp: , Rfl:  ?  fluocinolone (VANOS) 0.01 % cream, Apply topically 3 (three) times daily as needed., Disp: , Rfl:  ?  HYDROmorphone (DILAUDID) 4 MG tablet, Take 1 tablet (4 mg total) by mouth every 4 (four) hours as needed for severe pain. Take one to 2 tablets every 4 hours as needed for pain, Disp: 120 tablet, Rfl: 0 ?  lactulose (CHRONULAC) 10 GM/15ML solution, Take 30 mLs (20 g total) by mouth 3 (three) times daily., Disp: 1000 mL, Rfl: 4 ?  LORazepam (ATIVAN) 1 MG tablet, Take 1 tablet (1 mg total) by mouth every 6 (six) hours as needed for anxiety., Disp: 90 tablet, Rfl: 0 ?  metFORMIN (GLUCOPHAGE) 500 MG tablet, Take by mouth daily., Disp: , Rfl:  ?  methocarbamol (ROBAXIN) 500 MG tablet, Take 1 tablet (500 mg total) by mouth every 8 (eight) hours as needed  for muscle spasms., Disp: 60 tablet, Rfl: 0 ?  metoCLOPramide (REGLAN) 10 MG tablet, Take 2 tablets (20 mg total) by mouth 4 (four) times daily -  before meals and at bedtime., Disp: 120 tablet, Rfl: 4 ?  montelukast (SINGULAIR) 10 MG tablet, Take 10 mg by mouth at bedtime., Disp: , Rfl:  ?  Multiple Vitamin (MULTIVITAMIN) capsule, Take 1 capsule by mouth daily., Disp: , Rfl:  ?  oxyCODONE ER (XTAMPZA ER) 18 MG C12A, Take 18 mg by mouth every 8 (eight) hours., Disp: 90 capsule, Rfl: 0 ?  pravastatin (PRAVACHOL) 40 MG tablet, Take 40 mg by mouth daily., Disp: , Rfl:  ?  ACCU-CHEK GUIDE test strip, CHECK BLOOD SUGAR DAILY (Patient not taking: Reported on 07/26/2021), Disp: , Rfl:  ?  Blood Glucose Monitoring Suppl (ACCU-CHEK GUIDE) w/Device KIT, USE AS DIRECTED TO CHECK BLOOD SUGAR (Patient not  taking: Reported on 07/26/2021), Disp: , Rfl:  ?  Blood Glucose Monitoring Suppl (GLUCOCOM BLOOD GLUCOSE MONITOR) DEVI, Use to check blood sugar daily. (Patient not taking: Reported on 07/26/2021), Disp: , Rfl:  ?  glucose blood (PRECISION QID TEST) test strip, Use to check blood sugar daily. (Patient not taking: Reported on 07/26/2021), Disp: , Rfl:  ?  Lancets MISC, Use to check blood sugar daily. (Patient not taking: Reported on 07/26/2021), Disp: , Rfl:  ?  LORazepam (ATIVAN) 1 MG tablet, Take 1 tablet 60 min prior to PET or MRI, and one more tablet 30 minutes prior to imaging if needed for anxiety. (Take 1- 2 tablets PRN anxiety prior to imaging). (Patient not taking: Reported on 07/26/2021), Disp: 8 tablet, Rfl: 0 ?  senna-docusate (SENOKOT-S) 8.6-50 MG tablet, Take 2 tablets by mouth 2 (two) times daily., Disp: 90 tablet, Rfl: 2 ? ?Allergies:  ?Allergies  ?Allergen Reactions  ? Gabapentin Swelling  ?  Leg swelling ?  ? Levetiracetam Other (See Comments)  ?  Personality changes  ? Pregabalin Swelling  ?  Leg swelling ?  ? Bupropion Nausea And Vomiting  ? Duloxetine Other (See Comments)  ?  Excessive sleeping  ? Ezetimibe Other (See Comments)  ? Other Other (See Comments)  ?  Antibiotics / post. C-diff  ? Prochlorperazine Nausea And Vomiting  ? Lamotrigine Itching and Rash  ? ? ?Past Medical History, Surgical history, Social history, and Family History were reviewed and updated. ? ?Review of Systems: ?Review of Systems  ?Constitutional: Negative.   ?HENT:  Negative.    ?Eyes: Negative.   ?Respiratory: Negative.    ?Cardiovascular: Negative.   ?Gastrointestinal: Negative.   ?Endocrine: Negative.   ?Genitourinary: Negative.    ?Musculoskeletal: Negative.   ?Skin: Negative.   ?Neurological: Negative.   ?Hematological: Negative.   ?Psychiatric/Behavioral:  The patient is nervous/anxious.   ? ?Physical Exam: ? oral temperature is 97 ?F (36.1 ?C) (abnormal). His blood pressure is 143/98 (abnormal) and his pulse is 95. His  respiration is 20 and oxygen saturation is 96%.  ? ?Wt Readings from Last 3 Encounters:  ?07/22/21 169 lb (76.7 kg)  ?07/14/21 170 lb 14.4 oz (77.5 kg)  ?07/13/21 170 lb (77.1 kg)  ? ? ?Physical Exam ?Vitals reviewed.  ?HENT:  ?   Head: Normocephalic and atraumatic.  ?Eyes:  ?   Pupils: Pupils are equal, round, and reactive to light.  ?Cardiovascular:  ?   Rate and Rhythm: Normal rate and regular rhythm.  ?   Heart sounds: Normal heart sounds.  ?Pulmonary:  ?   Effort: Pulmonary effort  is normal.  ?   Breath sounds: Normal breath sounds.  ?Abdominal:  ?   General: Bowel sounds are normal.  ?   Palpations: Abdomen is soft.  ?Musculoskeletal:     ?   General: No tenderness or deformity. Normal range of motion.  ?   Cervical back: Normal range of motion.  ?Lymphadenopathy:  ?   Cervical: No cervical adenopathy.  ?Skin: ?   General: Skin is warm and dry.  ?   Findings: No erythema or rash.  ?   Comments: On his skin exam, there are very scattered small erythematous maculopapular lesions.  These are maybe a millimeter in size.  They are pruritic.  There might be a central eschar.  There is no pustules.  ?Neurological:  ?   Mental Status: He is alert and oriented to person, place, and time.  ?Psychiatric:     ?   Behavior: Behavior normal.     ?   Thought Content: Thought content normal.     ?   Judgment: Judgment normal.  ? ? ? ?Lab Results  ?Component Value Date  ? WBC 7.1 07/26/2021  ? HGB 13.2 07/26/2021  ? HCT 38.4 (L) 07/26/2021  ? MCV 91.4 07/26/2021  ? PLT 553 (H) 07/26/2021  ? ?  Chemistry   ?   ?Component Value Date/Time  ? NA 131 (L) 07/06/2021 0748  ? K 4.4 07/06/2021 0748  ? CL 97 (L) 07/06/2021 0748  ? CO2 25 07/06/2021 0748  ? BUN 22 (H) 07/06/2021 0748  ? CREATININE 0.89 07/06/2021 0748  ?    ?Component Value Date/Time  ? CALCIUM 8.9 07/06/2021 0748  ? ALKPHOS 64 07/06/2021 0748  ? AST 26 07/06/2021 0748  ? ALT 65 (H) 07/06/2021 0748  ? BILITOT 0.3 07/06/2021 0748  ?  ? ? ?Impression and Plan: ?Bill Garza is  a very nice 61 year old white male.  He has a history of metastatic adenocarcinoma of the lung.  He initially presented with localized-stage IIB -adenocarcinoma of the left lung.  He was placed on clinical tria

## 2021-07-26 NOTE — Patient Instructions (Addendum)
Zoledronic Acid Injection (Cancer) ?What is this medication? ?ZOLEDRONIC ACID (ZOE le dron ik AS id) treats high calcium levels in the blood caused by cancer. It may also be used with chemotherapy to treat weakened bones caused by cancer. It works by slowing down the release of calcium from bones. This lowers calcium levels in your blood. It also makes your bones stronger and less likely to break (fracture). It belongs to a group of medications called bisphosphonates. ?This medicine may be used for other purposes; ask your health care provider or pharmacist if you have questions. ?COMMON BRAND NAME(S): Zometa, Zometa Powder ?What should I tell my care team before I take this medication? ?They need to know if you have any of these conditions: ?Dehydration ?Dental disease ?Kidney disease ?Liver disease ?Low levels of calcium in the blood ?Lung or breathing disease, such as asthma ?Receiving steroids, such as dexamethasone or prednisone ?An unusual or allergic reaction to zoledronic acid, other medications, foods, dyes, or preservatives ?Pregnant or trying to get pregnant ?Breast-feeding ?How should I use this medication? ?This medication is injected into a vein. It is given by your care team in a hospital or clinic setting. ?Talk to your care team about the use of this medication in children. Special care may be needed. ?Overdosage: If you think you have taken too much of this medicine contact a poison control center or emergency room at once. ?NOTE: This medicine is only for you. Do not share this medicine with others. ?What if I miss a dose? ?Keep appointments for follow-up doses. It is important not to miss your dose. Call your care team if you are unable to keep an appointment. ?What may interact with this medication? ?Certain antibiotics given by injection ?Diuretics, such as bumetanide, furosemide ?NSAIDs, medications for pain and inflammation, such as ibuprofen or naproxen ?Teriparatide ?Thalidomide ?This list  may not describe all possible interactions. Give your health care provider a list of all the medicines, herbs, non-prescription drugs, or dietary supplements you use. Also tell them if you smoke, drink alcohol, or use illegal drugs. Some items may interact with your medicine. ?What should I watch for while using this medication? ?Visit your care team for regular checks on your progress. It may be some time before you see the benefit from this medication. ?Some people who take this medication have severe bone, joint, or muscle pain. This medication may also increase your risk for jaw problems or a broken thigh bone. Tell your care team right away if you have severe pain in your jaw, bones, joints, or muscles. Tell you care team if you have any pain that does not go away or that gets worse. ?Tell your dentist and dental surgeon that you are taking this medication. You should not have major dental surgery while on this medication. See your dentist to have a dental exam and fix any dental problems before starting this medication. Take good care of your teeth while on this medication. Make sure you see your dentist for regular follow-up appointments. ?You should make sure you get enough calcium and vitamin D while you are taking this medication. Discuss the foods you eat and the vitamins you take with your care team. ?Check with your care team if you have severe diarrhea, nausea, and vomiting, or if you sweat a lot. The loss of too much body fluid may make it dangerous for you to take this medication. ?You may need bloodwork while taking this medication. ?Talk to your care team if  you wish to become pregnant or think you might be pregnant. This medication can cause serious birth defects. ?What side effects may I notice from receiving this medication? ?Side effects that you should report to your care team as soon as possible: ?Allergic reactions--skin rash, itching, hives, swelling of the face, lips, tongue, or  throat ?Kidney injury--decrease in the amount of urine, swelling of the ankles, hands, or feet ?Low calcium level--muscle pain or cramps, confusion, tingling, or numbness in the hands or feet ?Osteonecrosis of the jaw--pain, swelling, or redness in the mouth, numbness of the jaw, poor healing after dental work, unusual discharge from the mouth, visible bones in the mouth ?Severe bone, joint, or muscle pain ?Side effects that usually do not require medical attention (report to your care team if they continue or are bothersome): ?Constipation ?Fatigue ?Fever ?Loss of appetite ?Nausea ?Stomach pain ?This list may not describe all possible side effects. Call your doctor for medical advice about side effects. You may report side effects to FDA at 1-800-FDA-1088. ?Where should I keep my medication? ?This medication is given in a hospital or clinic. It will not be stored at home. ?NOTE: This sheet is a summary. It may not cover all possible information. If you have questions about this medicine, talk to your doctor, pharmacist, or health care provider. ?? 2023 Elsevier/Gold Standard (2021-04-30 00:00:00) ? ?

## 2021-07-27 ENCOUNTER — Ambulatory Visit
Admission: RE | Admit: 2021-07-27 | Discharge: 2021-07-27 | Disposition: A | Discharge status: Home / Self Care | Payer: Medicare HMO | Source: Ambulatory Visit | Attending: Neurosurgery | Admitting: Neurosurgery

## 2021-07-27 DIAGNOSIS — M5126 Other intervertebral disc displacement, lumbar region: Secondary | ICD-10-CM

## 2021-07-27 MED ORDER — IOPAMIDOL (ISOVUE-M 200) INJECTION 41%
1.0000 mL | Freq: Once | INTRAMUSCULAR | Status: AC
  Administered 2021-07-27: 1 mL via EPIDURAL

## 2021-07-27 MED ORDER — METHYLPREDNISOLONE ACETATE 40 MG/ML INJ SUSP (RADIOLOG
80.0000 mg | Freq: Once | INTRAMUSCULAR | Status: AC
  Administered 2021-07-27: 80 mg via EPIDURAL

## 2021-07-27 NOTE — Discharge Instructions (Signed)

## 2021-07-28 ENCOUNTER — Other Ambulatory Visit: Payer: Self-pay | Admitting: *Deleted

## 2021-07-28 ENCOUNTER — Other Ambulatory Visit: Payer: Self-pay | Admitting: Nurse Practitioner

## 2021-07-28 ENCOUNTER — Encounter: Payer: Self-pay | Admitting: Nurse Practitioner

## 2021-07-28 ENCOUNTER — Inpatient Hospital Stay (HOSPITAL_BASED_OUTPATIENT_CLINIC_OR_DEPARTMENT_OTHER): Payer: Medicare HMO | Admitting: Nurse Practitioner

## 2021-07-28 ENCOUNTER — Inpatient Hospital Stay: Payer: Medicare HMO

## 2021-07-28 ENCOUNTER — Ambulatory Visit: Payer: Medicare HMO

## 2021-07-28 ENCOUNTER — Telehealth: Payer: Self-pay | Admitting: Nurse Practitioner

## 2021-07-28 ENCOUNTER — Telehealth: Payer: Self-pay | Admitting: *Deleted

## 2021-07-28 ENCOUNTER — Other Ambulatory Visit: Payer: Self-pay

## 2021-07-28 ENCOUNTER — Encounter: Payer: Self-pay | Admitting: *Deleted

## 2021-07-28 VITALS — BP 133/90 | HR 95 | Temp 98.2°F | Resp 16

## 2021-07-28 DIAGNOSIS — G893 Neoplasm related pain (acute) (chronic): Secondary | ICD-10-CM

## 2021-07-28 DIAGNOSIS — Z515 Encounter for palliative care: Secondary | ICD-10-CM

## 2021-07-28 DIAGNOSIS — K5900 Constipation, unspecified: Secondary | ICD-10-CM | POA: Diagnosis not present

## 2021-07-28 DIAGNOSIS — C7931 Secondary malignant neoplasm of brain: Secondary | ICD-10-CM

## 2021-07-28 DIAGNOSIS — R110 Nausea: Secondary | ICD-10-CM

## 2021-07-28 DIAGNOSIS — C7951 Secondary malignant neoplasm of bone: Secondary | ICD-10-CM

## 2021-07-28 DIAGNOSIS — R530 Neoplastic (malignant) related fatigue: Secondary | ICD-10-CM

## 2021-07-28 DIAGNOSIS — C3490 Malignant neoplasm of unspecified part of unspecified bronchus or lung: Secondary | ICD-10-CM

## 2021-07-28 DIAGNOSIS — C3492 Malignant neoplasm of unspecified part of left bronchus or lung: Secondary | ICD-10-CM | POA: Diagnosis not present

## 2021-07-28 DIAGNOSIS — R112 Nausea with vomiting, unspecified: Secondary | ICD-10-CM

## 2021-07-28 MED ORDER — HYDROMORPHONE HCL 1 MG/ML IJ SOLN: 1.0000 mg | Freq: Once | INTRAMUSCULAR | Status: AC

## 2021-07-28 MED ORDER — DEXAMETHASONE SODIUM PHOSPHATE 100 MG/10ML IJ SOLN
20.0000 mg | Freq: Once | INTRAMUSCULAR | Status: AC
  Administered 2021-07-28: 20 mg via INTRAVENOUS
  Filled 2021-07-28: qty 20

## 2021-07-28 MED ORDER — HYDROMORPHONE HCL 1 MG/ML IJ SOLN
INTRAMUSCULAR | Status: AC
  Administered 2021-07-28: 1 mg via INTRAVENOUS
  Filled 2021-07-28: qty 1

## 2021-07-28 MED ORDER — HYDROMORPHONE HCL 1 MG/ML IJ SOLN
2.0000 mg | Freq: Once | INTRAMUSCULAR | Status: AC | PRN
  Administered 2021-07-28: 2 mg via INTRAVENOUS
  Filled 2021-07-28: qty 2

## 2021-07-28 MED ORDER — HYDROMORPHONE HCL 1 MG/ML IJ SOLN
1.0000 mg | INTRAMUSCULAR | Status: AC | PRN
  Administered 2021-07-28 (×2): 1 mg via INTRAVENOUS
  Filled 2021-07-28 (×2): qty 1

## 2021-07-28 MED ORDER — HYDROMORPHONE HCL 1 MG/ML IJ SOLN: 2.0000 mg | INTRAMUSCULAR | Status: DC | PRN

## 2021-07-28 MED ORDER — SODIUM CHLORIDE 0.9 % IV SOLN: INTRAVENOUS | Status: DC

## 2021-07-28 MED ORDER — ONDANSETRON HCL 4 MG/2ML IJ SOLN
4.0000 mg | Freq: Once | INTRAMUSCULAR | Status: AC
  Administered 2021-07-28: 4 mg via INTRAVENOUS
  Filled 2021-07-28: qty 2

## 2021-07-28 NOTE — Progress Notes (Signed)
? ?  ?Palliative Medicine ?[facility]  ?[phone] [phone] ? ? ?Name: Bill Garza ?Date: 07/28/2021 ?MRN: [id number]  ?DOB: 1960/03/26 ? ?Patient Care Team: ?Aleen Campi, NP as PCP - General ?Pickenpack-Cousar, Carlena Sax, NP as Nurse Practitioner (Nurse Practitioner)  ? ?  ?REASON FOR CONSULTATION: ?Bill Garza is a 61 y.o. 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  Palliative ask to see for symptom management and goals of care.  ? ? ?SOCIAL HISTORY:    ? reports that he quit smoking about a year ago. His smoking use included cigarettes. He has a 40.00 pack-year smoking history. He has never used smokeless tobacco. He reports that he does not currently use alcohol. He reports that he does not currently use drugs. ? ?ADVANCE DIRECTIVES:  ?None on file  ? ?CODE STATUS:  ? ?PAST MEDICAL HISTORY: ?Past Medical History:  ?Diagnosis Date  ? Chronic back pain   ? Chronic GERD   ? Chronic pain   ? COPD (chronic obstructive pulmonary disease) ([place])   ? Coronary artery disease   ? Depression   ? Diabetes mellitus without complication ([place])   ? Gastroparesis   ? Goals of care, counseling/discussion 01/27/2021  ? High risk medication use   ? Hyperlipidemia   ? MI (myocardial infarction) ([place])   ? Non-small cell lung cancer metastatic to brain ([place]) 01/27/2021  ? Restless leg syndrome   ? ? ?PAST SURGICAL HISTORY:  ?Past Surgical History:  ?Procedure Laterality Date  ? BACK SURGERY    ? CARDIAC CATHETERIZATION    ? ? ?HEMATOLOGY/ONCOLOGY HISTORY:  ?Oncology History  ?Non-small cell lung cancer metastatic to brain [facility])  ?01/27/2021 Initial Diagnosis  ? Non-small cell lung cancer metastatic to brain [facility]) ? ?  ?01/27/2021 Cancer Staging  ?  Staging form: Lung, AJCC 8th Edition ?- Clinical stage from 01/27/2021: Stage IV (cT3, cN0, cM1) - Signed by [name], MD on 01/27/2021 ?Stage prefix: Initial diagnosis ?Histologic grade (G): G2 ?Histologic grading system: 4 grade system ? ?  ? ? ?ALLERGIES:  is allergic to gabapentin, levetiracetam, pregabalin, bupropion, duloxetine, ezetimibe, other, prochlorperazine, and lamotrigine. ? ?MEDICATIONS:  ?Current Outpatient Medications  ?Medication Sig Dispense Refill  ? ACCU-CHEK GUIDE test strip CHECK BLOOD SUGAR DAILY (Patient not taking: Reported on 07/26/2021)    ? acetaminophen (TYLENOL) 325 MG tablet Take by mouth.    ? albuterol (PROVENTIL) (2.5 MG/3ML) 0.083% nebulizer solution Take by nebulization every 6 (six) hours as needed.    ? albuterol (VENTOLIN HFA) 108 (90 Base) MCG/ACT inhaler Inhale 2 puffs into the lungs every 4 (four) hours as needed for wheezing or shortness of breath. 3 each 1  ? aspirin EC 81 MG tablet Take 81 mg by mouth daily.    ? Bempedoic Acid (NEXLETOL) 180 MG TABS Take 1 tablet by mouth daily.    ? Blood Glucose Monitoring Suppl (ACCU-CHEK GUIDE) w/Device KIT USE AS DIRECTED TO CHECK BLOOD SUGAR (Patient not taking: Reported on 07/26/2021)    ? Blood Glucose Monitoring Suppl (GLUCOCOM BLOOD GLUCOSE MONITOR) DEVI Use to check blood sugar daily. (Patient not taking: Reported on 07/26/2021)    ? budesonide-formoterol (SYMBICORT) 160-4.5 MCG/ACT inhaler Inhale 2 puffs into the lungs 2 (two) times daily. 3 each 1  ? escitalopram (LEXAPRO) 5 MG tablet Take  5 mg by mouth daily.    ? fluocinolone (VANOS) 0.01 % cream Apply topically 3 (three) times daily as needed.    ? glucose blood (PRECISION QID TEST) test strip Use to check blood sugar daily. (Patient not taking: Reported on 07/26/2021)    ? HYDROmorphone (DILAUDID) 4 MG tablet Take 1 tablet (4 mg total) by mouth every 4 (four) hours as needed for severe pain. Take one to 2 tablets every 4 hours as needed for pain 120 tablet 0  ? lactulose  (CHRONULAC) 10 GM/15ML solution Take 30 mLs (20 g total) by mouth 3 (three) times daily. 1000 mL 4  ? Lancets MISC Use to check blood sugar daily. (Patient not taking: Reported on 07/26/2021)    ? LORazepam (ATIVAN) 1 MG tablet Take 1 tablet 60 min prior to PET or MRI, and one more tablet 30 minutes prior to imaging if needed for anxiety. (Take 1- 2 tablets PRN anxiety prior to imaging). (Patient not taking: Reported on 07/26/2021) 8 tablet 0  ? LORazepam (ATIVAN) 1 MG tablet Take 1 tablet (1 mg total) by mouth every 6 (six) hours as needed for anxiety. 90 tablet 0  ? metFORMIN (GLUCOPHAGE) 500 MG tablet Take by mouth daily.    ? methocarbamol (ROBAXIN) 500 MG tablet Take 1 tablet (500 mg total) by mouth every 8 (eight) hours as needed for muscle spasms. 60 tablet 0  ? metoCLOPramide (REGLAN) 10 MG tablet Take 2 tablets (20 mg total) by mouth 4 (four) times daily -  before meals and at bedtime. 120 tablet 4  ? montelukast (SINGULAIR) 10 MG tablet Take 10 mg by mouth at bedtime.    ? Multiple Vitamin (MULTIVITAMIN) capsule Take 1 capsule by mouth daily.    ? oxyCODONE ER (XTAMPZA ER) 18 MG C12A Take 18 mg by mouth every 8 (eight) hours. 90 capsule 0  ? pravastatin (PRAVACHOL) 40 MG tablet Take 40 mg by mouth daily.    ? senna-docusate (SENOKOT-S) 8.6-50 MG tablet Take 2 tablets by mouth 2 (two) times daily. 90 tablet 2  ? ?No current facility-administered medications for this visit.  ? ?Facility-Administered Medications Ordered in Other Visits  ?Medication Dose Route Frequency Provider Last Rate Last Admin  ? 0.9 %  sodium chloride infusion   Intravenous Continuous Pickenpack-Cousar, Kalandra Masters N, NP 500 mL/hr at 07/28/21 1015 New Bag at 07/28/21 1015  ? ? ?VITAL SIGNS: ?There were no vitals taken for this visit. ?There were no vitals filed for this visit. ?  ?Estimated body mass index is 22.92 kg/m? as calculated from the following: ?  Height as of 07/14/21: 6' (1.829 m). ?  Weight as of 07/22/21: 169 lb (76.7  kg). ? ? ?PERFORMANCE STATUS (ECOG) : 1 - Symptomatic but completely ambulatory ? ?IMPRESSION: ? ?Bill Garza and his wife presents to the clinic today due to increase in pain and need for ongoing symptom management. He was seen by Dr. Wilhelmina Mcardle on yesterday and received his 2nd injection. They verbalized understanding per his team pain may get worst before it gets better but with hopes of some improvement. Bill Garza appears uncomfortable. Wife reports pain intensified early this morning. He has not slept all night and also began having nausea. Despite home regimen no relief. Denies numbness or tingling, headache, dizziness, or any new areas of pain. No recent falls or injuries.  ? ?Pain  ?We discussed Bill Garza pain regimen at length. His pain was somewhat improved with previous regimen however the past week seems  to have intensified.  ? ?Continues to have some anxiety/anxiousness during the night interrupting his sleep.  ? ?He also saw [name] this week and received supportive care (IVF and medication) during his visit. We discussed at length ongoing pain management and possible hospital admission if pain continues to be uncontrolled.  ? ?He would like to try to manage outside the hospital if at all possible. We will try to do what we can to get him to a good place over the next few days. If pain continues to be intense despite home regimen and interventions he most likely will have to be admitted to allow observation and management of pain as much as he is hopeful this will not be required.  ? ?Extensive education provided on current regimen with recommended adjustments. He unfortunately is unable to take gabapentin, lyrica, SSRI due to previous interactions.  ? ?Constipation  ? ? Mr. Bill Garza states he has struggled with constipation. His current regimen includes Miralax, Senna, and lactulose.  ? ?Advised to continue current regimen.  ? ?I discussed the importance of continued conversation with family and their  medical providers regarding overall plan of care and treatment options, ensuring decisions are within the context of the patients values and GOCs. ? ?Bill Garza will be scheduled to come in tomorrow and Friday for IVF and IV pain

## 2021-07-28 NOTE — Telephone Encounter (Signed)
Spoke to patient's wife, who expressed that he needs to be seen as soon as possible, as he has been vomiting all night and is in severe intractable pain.  Advised to bring him in as soon as they could get here and will be treated in symptom management. ?

## 2021-07-28 NOTE — Telephone Encounter (Signed)
.  Called patient to schedule appointment per 5.9 inbasket, patient is aware of date and time.   ?

## 2021-07-28 NOTE — Progress Notes (Signed)
Patient assessed and states he is feeling much better than when he came in.  He appears to be as comfortable as he can be at this point.  Assessed site in lower back where he had injection yesterday.  Band-Aid removed and site is clean dry and intact.  No signs of infection or swelling noted.  Wife present to take him home ambulatory and  in stable condition.   No acute distress noted. ?

## 2021-07-29 ENCOUNTER — Inpatient Hospital Stay: Payer: Medicare HMO

## 2021-07-29 ENCOUNTER — Other Ambulatory Visit: Payer: Self-pay | Admitting: *Deleted

## 2021-07-29 DIAGNOSIS — K5900 Constipation, unspecified: Secondary | ICD-10-CM

## 2021-07-29 DIAGNOSIS — G893 Neoplasm related pain (acute) (chronic): Secondary | ICD-10-CM

## 2021-07-29 DIAGNOSIS — R110 Nausea: Secondary | ICD-10-CM

## 2021-07-29 DIAGNOSIS — C3490 Malignant neoplasm of unspecified part of unspecified bronchus or lung: Secondary | ICD-10-CM

## 2021-07-29 DIAGNOSIS — R530 Neoplastic (malignant) related fatigue: Secondary | ICD-10-CM

## 2021-07-29 DIAGNOSIS — C3492 Malignant neoplasm of unspecified part of left bronchus or lung: Secondary | ICD-10-CM | POA: Diagnosis not present

## 2021-07-29 DIAGNOSIS — Z515 Encounter for palliative care: Secondary | ICD-10-CM

## 2021-07-29 MED ORDER — LORAZEPAM 2 MG/ML IJ SOLN
0.5000 mg | Freq: Once | INTRAMUSCULAR | Status: AC
  Administered 2021-07-29: 0.5 mg via INTRAVENOUS
  Filled 2021-07-29: qty 1

## 2021-07-29 MED ORDER — OXYCODONE-ACETAMINOPHEN 5-325 MG PO TABS: 1.0000 | ORAL_TABLET | ORAL | 0 refills | Status: DC | PRN

## 2021-07-29 MED ORDER — SODIUM CHLORIDE 0.9 % IV SOLN: INTRAVENOUS | Status: DC

## 2021-07-29 MED ORDER — OXYCODONE HCL 5 MG PO TABS
5.0000 mg | ORAL_TABLET | Freq: Once | ORAL | Status: AC
  Administered 2021-07-29: 5 mg via ORAL
  Filled 2021-07-29: qty 1

## 2021-07-29 MED ORDER — ACETAMINOPHEN 325 MG PO TABS
325.0000 mg | ORAL_TABLET | Freq: Once | ORAL | Status: AC
  Administered 2021-07-29: 325 mg via ORAL
  Filled 2021-07-29: qty 1

## 2021-07-29 NOTE — Progress Notes (Addendum)
Spoke with patient's wife who expressed that he would like to exchange his p.o. Dilaudid for Percocet 5/325 mg as previously prescribed.  States that the Dilaudid is not effectively managing his pain like the Percocet did, when he was on it previously.  Addressed sleep issues and advised that he take his Ativan prior to bedtime to facilitate sleep.  Advised percocet would be sent in.  Verbalized understanding. Patient will receive Percocet, per order from [facility] NP in infusion if he needs something for pain, as he declined Dilaudid injection.  Went to infusion to speak with him regarding changes and assess for any other needs and patient was resting well with eyes closed.  Wife, Bill Garza made aware to take remaining Dilaudid to the [facility] for disposal. ?

## 2021-07-29 NOTE — Patient Instructions (Signed)
Rehydration, Adult Rehydration is the replacement of body fluids, salts, and minerals (electrolytes) that are lost during dehydration. Dehydration is when there is not enough water or other fluids in the body. This happens when you lose more fluids than you take in. Common causes of dehydration include: Not drinking enough fluids. This can occur when you are ill or doing activities that require a lot of energy, especially in hot weather. Conditions that cause loss of water or other fluids, such as diarrhea, vomiting, sweating, or urinating a lot. Other illnesses, such as fever or infection. Certain medicines, such as those that remove excess fluid from the body (diuretics). Symptoms of mild or moderate dehydration may include thirst, dry lips and mouth, and dizziness. Symptoms of severe dehydration may include increased heart rate, confusion, fainting, and not urinating. For severe dehydration, you may need to get fluids through an IV at the hospital. For mild or moderate dehydration, you can usually rehydrate at home by drinking certain fluids as told by your health care provider. What are the risks? Generally, rehydration is safe. However, taking in too much fluid (overhydration) can be a problem. This is rare. Overhydration can cause an electrolyte imbalance, kidney failure, or a decrease in salt (sodium) levels in the body. Supplies needed You will need an oral rehydration solution (ORS) if your health care provider tells you to use one. This is a drink to treat dehydration. It can be found in pharmacies and retail stores. How to rehydrate Fluids Follow instructions from your health care provider for rehydration. The kind of fluid and the amount you should drink depend on your condition. In general, you should choose drinks that you prefer. If told by your health care provider, drink an ORS. Make an ORS by following instructions on the package. Start by drinking small amounts, about  cup (120  mL) every 5-10 minutes. Slowly increase how much you drink until you have taken the amount recommended by your health care provider. Drink enough clear fluids to keep your urine pale yellow. If you were told to drink an ORS, finish it first, then start slowly drinking other clear fluids. Drink fluids such as: Water. This includes sparkling water and flavored water. Drinking only water can lead to having too little sodium in your body (hyponatremia). Follow the advice of your health care provider. Water from ice chips you suck on. Fruit juice with water you add to it (diluted). Sports drinks. Hot or cold herbal teas. Broth-based soups. Milk or milk products. Food Follow instructions from your health care provider about what to eat while you rehydrate. Your health care provider may recommend that you slowly begin eating regular foods in small amounts. Eat foods that contain a healthy balance of electrolytes, such as bananas, oranges, potatoes, tomatoes, and spinach. Avoid foods that are greasy or contain a lot of sugar. In some cases, you may get nutrition through a feeding tube that is passed through your nose and into your stomach (nasogastric tube, or NG tube). This may be done if you have uncontrolled vomiting or diarrhea. Beverages to avoid  Certain beverages may make dehydration worse. While you rehydrate, avoid drinking alcohol. How to tell if you are recovering from dehydration You may be recovering from dehydration if: You are urinating more often than before you started rehydrating. Your urine is pale yellow. Your energy level improves. You vomit less frequently. You have diarrhea less frequently. Your appetite improves or returns to normal. You feel less dizzy or less light-headed.   Your skin tone and color start to look more normal. Follow these instructions at home: Take over-the-counter and prescription medicines only as told by your health care provider. Do not take sodium  tablets. Doing this can lead to having too much sodium in your body (hypernatremia). Contact a health care provider if: You continue to have symptoms of mild or moderate dehydration, such as: Thirst. Dry lips. Slightly dry mouth. Dizziness. Dark urine or less urine than normal. Muscle cramps. You continue to vomit or have diarrhea. Get help right away if you: Have symptoms of dehydration that get worse. Have a fever. Have a severe headache. Have been vomiting and the following happens: Your vomiting gets worse or does not go away. Your vomit includes blood or green matter (bile). You cannot eat or drink without vomiting. Have problems with urination or bowel movements, such as: Diarrhea that gets worse or does not go away. Blood in your stool (feces). This may cause stool to look black and tarry. Not urinating, or urinating only a small amount of very dark urine, within 6-8 hours. Have trouble breathing. Have symptoms that get worse with treatment. These symptoms may represent a serious problem that is an emergency. Do not wait to see if the symptoms will go away. Get medical help right away. Call your local emergency services (911 in the [place] Do not drive yourself to the hospital. Summary Rehydration is the replacement of body fluids and minerals (electrolytes) that are lost during dehydration. Follow instructions from your health care provider for rehydration. The kind of fluid and amount you should drink depend on your condition. Slowly increase how much you drink until you have taken the amount recommended by your health care provider. Contact your health care provider if you continue to show signs of mild or moderate dehydration. This information is not intended to replace advice given to you by your health care provider. Make sure you discuss any questions you have with your health care provider. Document Revised: 05/08/2019 Document Reviewed: 03/18/2019 Elsevier Patient  Education  2023 [employer].  

## 2021-07-30 ENCOUNTER — Other Ambulatory Visit: Payer: Self-pay

## 2021-07-30 ENCOUNTER — Encounter: Payer: Self-pay | Admitting: Hematology & Oncology

## 2021-07-30 ENCOUNTER — Inpatient Hospital Stay: Payer: Medicare HMO

## 2021-07-30 ENCOUNTER — Observation Stay (HOSPITAL_COMMUNITY): Payer: Medicare HMO

## 2021-07-30 ENCOUNTER — Encounter (HOSPITAL_COMMUNITY): Payer: Self-pay

## 2021-07-30 ENCOUNTER — Observation Stay (HOSPITAL_COMMUNITY)
Admission: EM | Admit: 2021-07-30 | Discharge: 2021-07-30 | Disposition: A | Discharge status: Home / Self Care | Payer: Medicare HMO | Attending: Emergency Medicine | Admitting: Emergency Medicine

## 2021-07-30 ENCOUNTER — Other Ambulatory Visit: Payer: Self-pay | Admitting: Radiation Therapy

## 2021-07-30 DIAGNOSIS — C3490 Malignant neoplasm of unspecified part of unspecified bronchus or lung: Secondary | ICD-10-CM | POA: Diagnosis present

## 2021-07-30 DIAGNOSIS — I10 Essential (primary) hypertension: Secondary | ICD-10-CM

## 2021-07-30 DIAGNOSIS — Z7982 Long term (current) use of aspirin: Secondary | ICD-10-CM | POA: Insufficient documentation

## 2021-07-30 DIAGNOSIS — Z85118 Personal history of other malignant neoplasm of bronchus and lung: Secondary | ICD-10-CM | POA: Insufficient documentation

## 2021-07-30 DIAGNOSIS — E119 Type 2 diabetes mellitus without complications: Secondary | ICD-10-CM

## 2021-07-30 DIAGNOSIS — K5909 Other constipation: Secondary | ICD-10-CM

## 2021-07-30 DIAGNOSIS — Z79899 Other long term (current) drug therapy: Secondary | ICD-10-CM | POA: Insufficient documentation

## 2021-07-30 DIAGNOSIS — Z720 Tobacco use: Secondary | ICD-10-CM

## 2021-07-30 DIAGNOSIS — J449 Chronic obstructive pulmonary disease, unspecified: Secondary | ICD-10-CM

## 2021-07-30 DIAGNOSIS — C801 Malignant (primary) neoplasm, unspecified: Secondary | ICD-10-CM | POA: Insufficient documentation

## 2021-07-30 DIAGNOSIS — R748 Abnormal levels of other serum enzymes: Secondary | ICD-10-CM

## 2021-07-30 DIAGNOSIS — Z85841 Personal history of malignant neoplasm of brain: Secondary | ICD-10-CM | POA: Insufficient documentation

## 2021-07-30 DIAGNOSIS — C7931 Secondary malignant neoplasm of brain: Secondary | ICD-10-CM | POA: Diagnosis present

## 2021-07-30 DIAGNOSIS — K219 Gastro-esophageal reflux disease without esophagitis: Secondary | ICD-10-CM

## 2021-07-30 DIAGNOSIS — C799 Secondary malignant neoplasm of unspecified site: Secondary | ICD-10-CM

## 2021-07-30 DIAGNOSIS — Z7984 Long term (current) use of oral hypoglycemic drugs: Secondary | ICD-10-CM | POA: Diagnosis not present

## 2021-07-30 DIAGNOSIS — F32A Depression, unspecified: Secondary | ICD-10-CM

## 2021-07-30 DIAGNOSIS — M545 Low back pain, unspecified: Secondary | ICD-10-CM | POA: Diagnosis present

## 2021-07-30 DIAGNOSIS — K3184 Gastroparesis: Secondary | ICD-10-CM

## 2021-07-30 DIAGNOSIS — C779 Secondary and unspecified malignant neoplasm of lymph node, unspecified: Secondary | ICD-10-CM | POA: Insufficient documentation

## 2021-07-30 DIAGNOSIS — E785 Hyperlipidemia, unspecified: Secondary | ICD-10-CM

## 2021-07-30 LAB — COMPREHENSIVE METABOLIC PANEL
ALT: 31 U/L (ref 0–44)
AST: 30 U/L (ref 15–41)
Albumin: 3 g/dL — ABNORMAL LOW (ref 3.5–5.0)
Alkaline Phosphatase: 223 U/L — ABNORMAL HIGH (ref 38–126)
Anion gap: 6 (ref 5–15)
BUN: 14 mg/dL (ref 6–20)
CO2: 23 mmol/L (ref 22–32)
Calcium: 8 mg/dL — ABNORMAL LOW (ref 8.9–10.3)
Chloride: 104 mmol/L (ref 98–111)
Creatinine, Ser: 0.73 mg/dL (ref 0.61–1.24)
GFR, Estimated: >60 mL/min (ref >60)
Glucose, Bld: 171 mg/dL — ABNORMAL HIGH (ref 70–99)
Potassium: 3.8 mmol/L (ref 3.5–5.1)
Sodium: 133 mmol/L — ABNORMAL LOW (ref 135–145)
Total Bilirubin: 0.4 mg/dL (ref 0.3–1.2)
Total Protein: 6.5 g/dL (ref 6.5–8.1)

## 2021-07-30 LAB — CBC WITH DIFFERENTIAL/PLATELET
Abs Immature Granulocytes: 0.04 10*3/uL (ref 0.00–0.07)
Basophils Absolute: 0 10*3/uL (ref 0.0–0.1)
Basophils Relative: 1 %
Eosinophils Absolute: 0 10*3/uL (ref 0.0–0.5)
Eosinophils Relative: 1 %
HCT: 34.4 % — ABNORMAL LOW (ref 39.0–52.0)
Hemoglobin: 11.7 g/dL — ABNORMAL LOW (ref 13.0–17.0)
Immature Granulocytes: 1 %
Lymphocytes Relative: 26 %
Lymphs Abs: 2 10*3/uL (ref 0.7–4.0)
MCH: 32 pg (ref 26.0–34.0)
MCHC: 34 g/dL (ref 30.0–36.0)
MCV: 94 fL (ref 80.0–100.0)
Monocytes Absolute: 0.5 10*3/uL (ref 0.1–1.0)
Monocytes Relative: 7 %
Neutro Abs: 5 10*3/uL (ref 1.7–7.7)
Neutrophils Relative %: 64 %
Platelets: 581 10*3/uL — ABNORMAL HIGH (ref 150–400)
RBC: 3.66 MIL/uL — ABNORMAL LOW (ref 4.22–5.81)
RDW: 15.8 % — ABNORMAL HIGH (ref 11.5–15.5)
WBC: 7.5 10*3/uL (ref 4.0–10.5)
nRBC: 0 % (ref 0.0–0.2)

## 2021-07-30 IMAGING — MR MR THORACIC SPINE WO/W CM
14 of 16 series · 35 of 48 positions shown · IV contrast (gadavist)
Comparison: Outside study [DATE]

CLINICAL DATA: Mid back pain, history of metastatic lung cancer

EXAM:
MRI THORACIC WITHOUT AND WITH CONTRAST
TECHNIQUE: Multiplanar and multiecho pulse sequences of the thoracic spine were
obtained without and with intravenous contrast.
CONTRAST:  7mL GADAVIST GADOBUTROL 1 MMOL/ML IV SOLN

[Series 16: T1 · sagittal · 4.0mm · 1.72mm/px · 1 of 5 slices shown (1 of 5)]
[im 1/5]
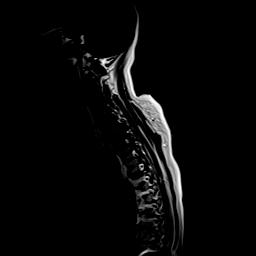

[Series 17: STIR · sagittal · 3.0mm · 1.00mm/px · 1 of 15 slices shown (1 of 2)]
[im 1/15]
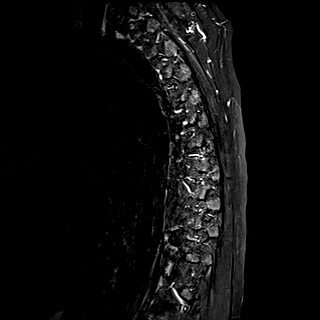

[Series 18: T1 · sagittal · 3.0mm · 1.00mm/px · 1 of 15 slices shown (2 of 5)]
[im 1/15]
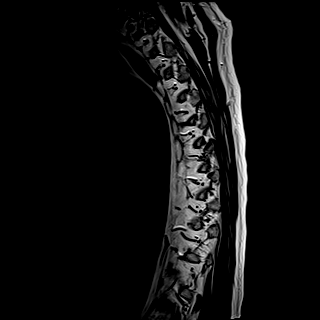

[Series 19: T2 · sagittal · 3.0mm · 0.83mm/px · 1 of 15 slices shown (1 of 3)]
[im 1/15]
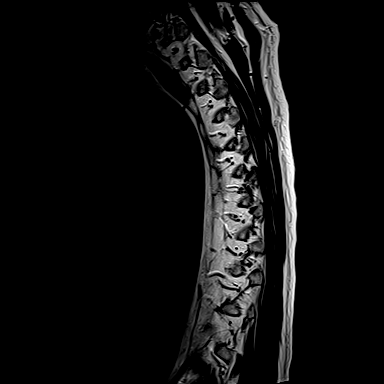

[Series 20: T2 · axial · 4.0mm · 0.78mm/px · z∈[-303,-85]mm · 4 of 45 slices shown (2 of 3)]
[im 1/45]
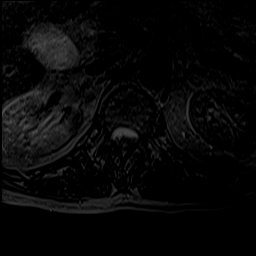
[im 15/45]
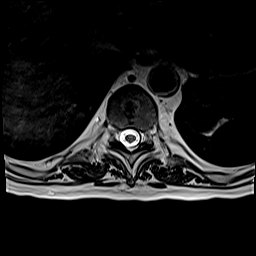
[im 30/45]
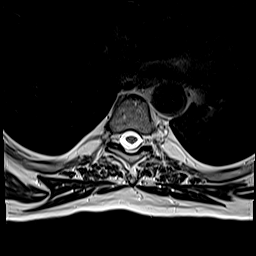
[im 45/45]
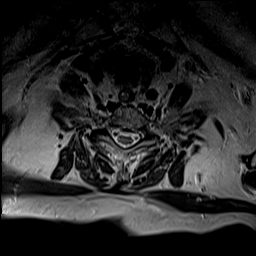

[Series 22: T1 · axial · 4.0mm · 0.52mm/px · z∈[-303,-85]mm · 5 of 45 slices shown (3 of 5)]
[im 1/45]
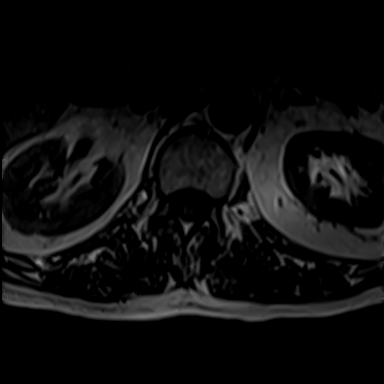
[im 12/45]
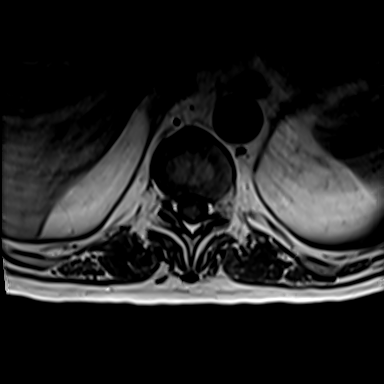
[im 23/45]
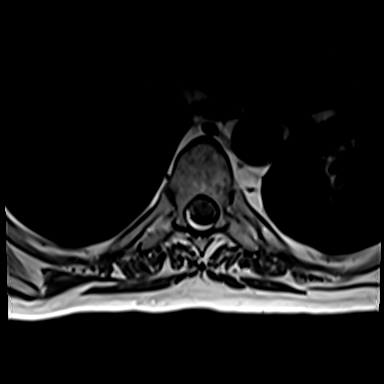
[im 34/45]
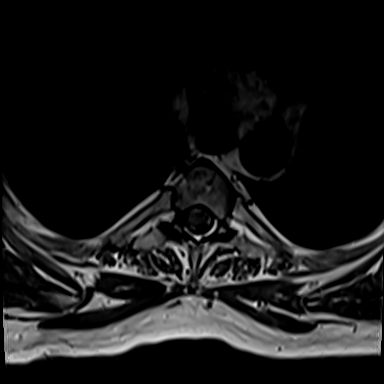
[im 45/45]
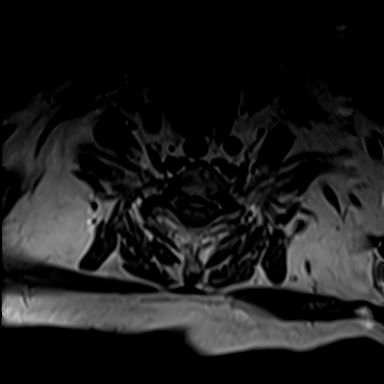

[Series 23: T1 · sagittal · 4.0mm · 0.81mm/px · 2 of 17 slices shown (4 of 5)]
[im 1/17]
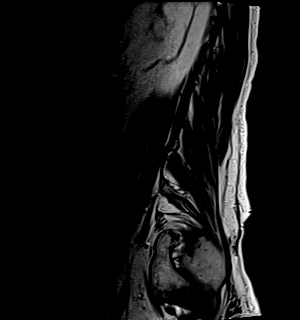
[im 17/17]
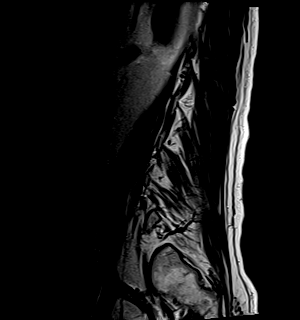

[Series 24: STIR · sagittal · 4.0mm · 0.51mm/px · 2 of 17 slices shown (2 of 2)]
[im 1/17]
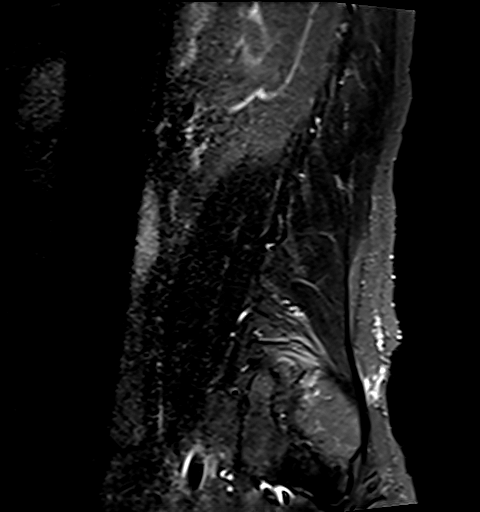
[im 17/17]
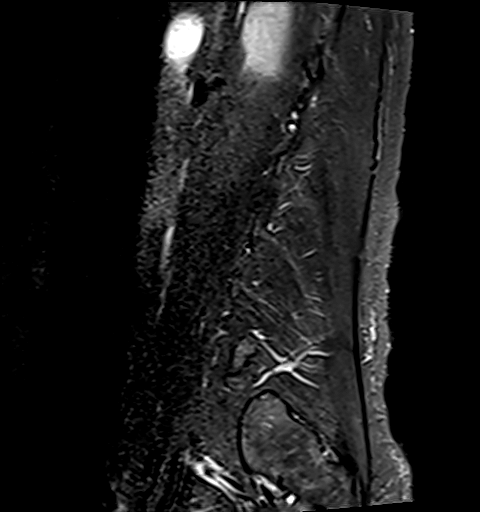

[Series 25: T2 · axial · 4.0mm · 0.62mm/px · z∈[-544,-341]mm · 5 of 41 slices shown (3 of 3)]
[im 1/41]
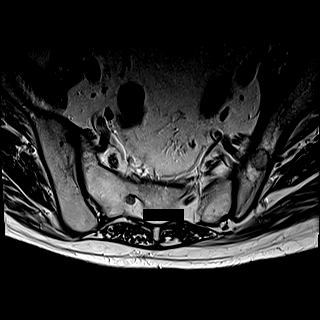
[im 11/41]
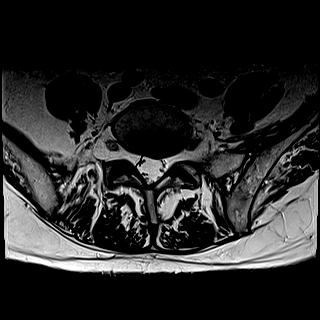
[im 21/41]
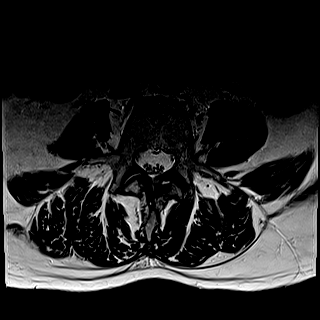
[im 31/41]
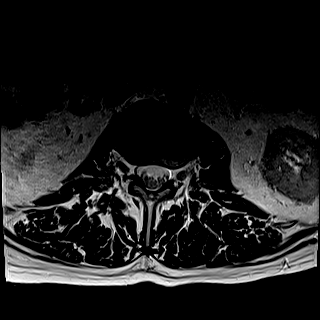
[im 41/41]
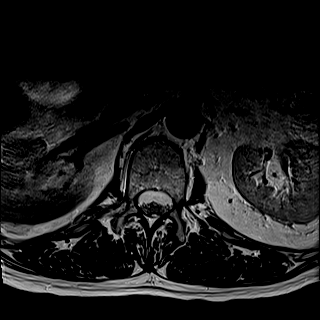

[Series 26: T1 · axial · 4.0mm · 0.39mm/px · z∈[-544,-341]mm · 5 of 41 slices shown (5 of 5)]
[im 1/41]
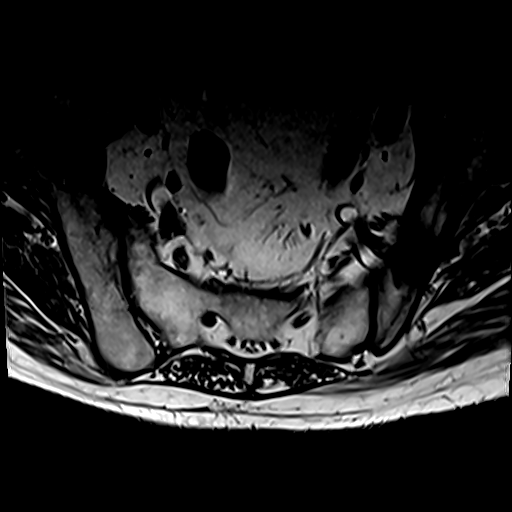
[im 11/41]
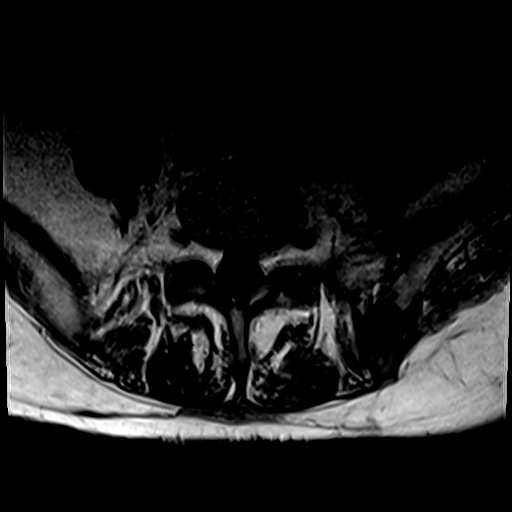
[im 21/41]
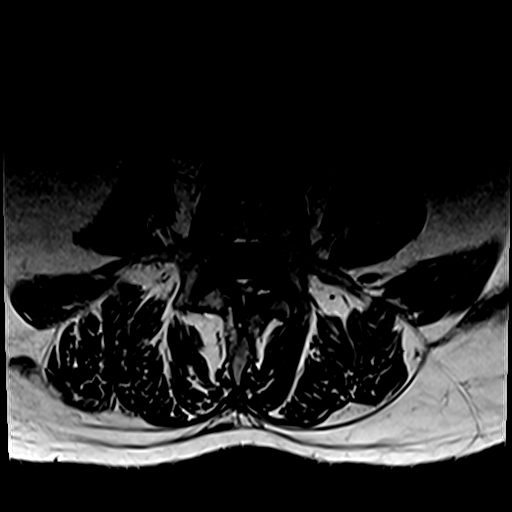
[im 31/41]
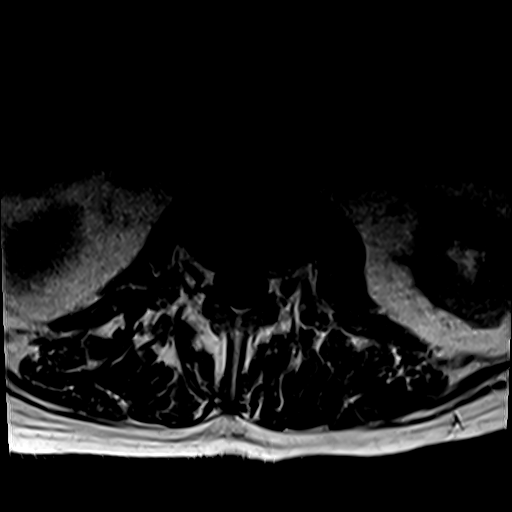
[im 41/41]
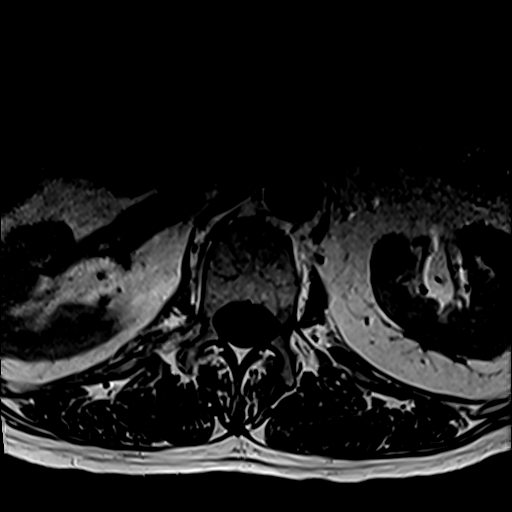

[Series 27: T2 post-contrast · sagittal · 4.0mm · 0.81mm/px · 2 of 17 slices shown]
[im 1/17]
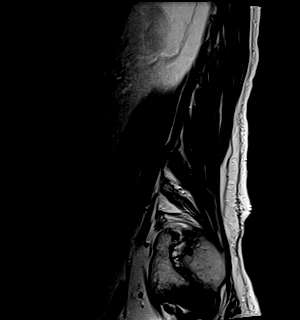
[im 17/17]
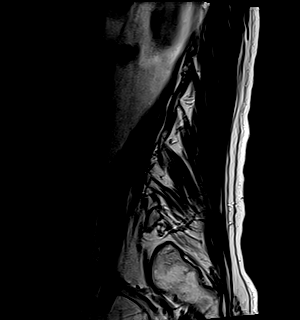

[Series 28: T1 fat-sat post-contrast · sagittal · 4.0mm · 0.81mm/px · 2 of 17 slices shown (1 of 2)]
[im 1/17]
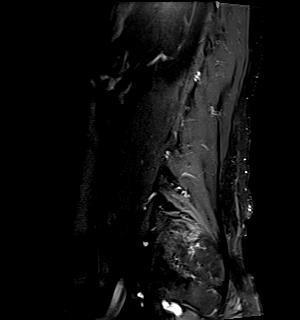
[im 17/17]
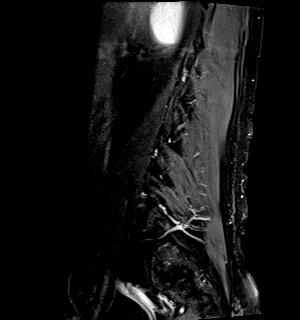

[Series 29: T1 post-contrast · axial · 4.0mm · 0.57mm/px · z∈[-545,-497]mm · 2 of 41 slices shown]
[im 1/41]
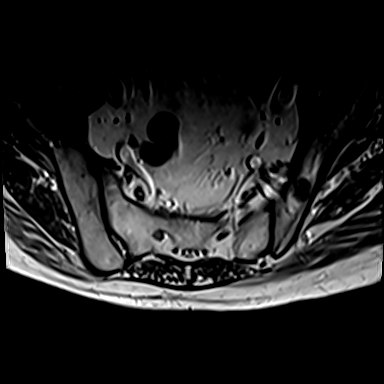
[im 11/41]
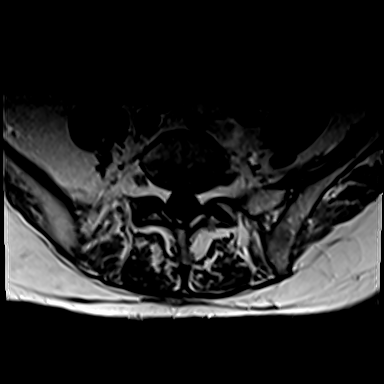

[Series 30: T1 fat-sat post-contrast · sagittal · 3.0mm · 1.00mm/px · 2 of 15 slices shown (2 of 2)]
[im 1/15]
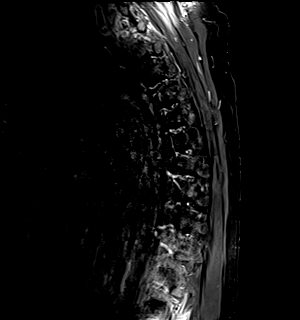
[im 15/15]
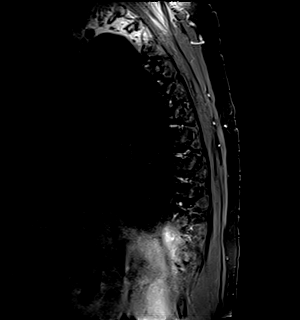

[35 of 48 positions shown; findings below may reference images not displayed]

FINDINGS: Alignment:  Stable.

Vertebrae: Stable vertebral body heights with mid to lower thoracic
spine degenerative endplate irregularity. Abnormal marrow signal and
enhancement at the right posterior aspect of T11 has increased in
size. Increased abnormal signal and enhancement at the inferior
endplates of T6 and T8. Increased focus of abnormal signal and
enhancement at the superior endplate of T3. Stable lesion at T9
probably reflects a hemangioma.

Cord:  No abnormal signal.  No abnormal intrathecal enhancement.

Paraspinal and other soft tissues: Unremarkable.

Disc levels: Stable minor degenerative changes without significant
canal or foraminal stenosis.
IMPRESSION: Increased foci of abnormal signal and enhancement including
involvement of T3, T6, T8, and T11 consistent with metastatic
disease. There is no significant extraosseous extension. No
pathologic fracture.

## 2021-07-30 IMAGING — MR MR LUMBAR SPINE WO/W CM
14 of 16 series · 34 of 48 positions shown · IV contrast (gadavist)
Comparison: None Available.

CLINICAL DATA: Low back pain, cancer suspected

EXAM:
MRI LUMBAR SPINE WITHOUT AND WITH CONTRAST
TECHNIQUE: Multiplanar and multiecho pulse sequences of the lumbar spine were
obtained without and with intravenous contrast.
CONTRAST:  7mL GADAVIST GADOBUTROL 1 MMOL/ML IV SOLN

[Series 17: T1 · sagittal · 4.0mm · 1.72mm/px · 1 of 5 slices shown (1 of 5)]
[im 1/5]
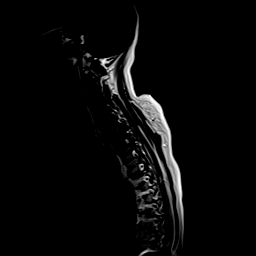

[Series 18: STIR · sagittal · 3.0mm · 1.00mm/px · 1 of 15 slices shown (1 of 2)]
[im 1/15]
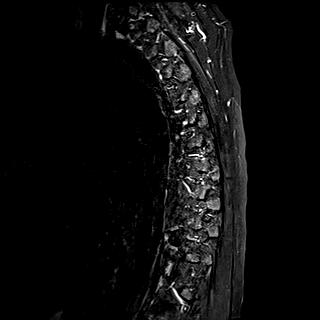

[Series 19: T1 · sagittal · 3.0mm · 1.00mm/px · 1 of 15 slices shown (2 of 5)]
[im 1/15]
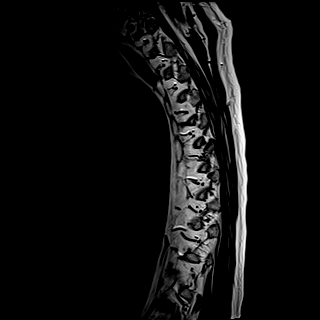

[Series 20: T2 · sagittal · 3.0mm · 0.83mm/px · 1 of 15 slices shown (1 of 3)]
[im 1/15]
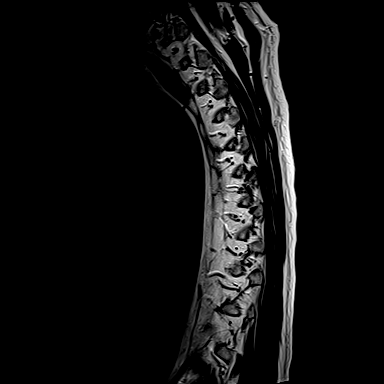

[Series 21: T2 · axial · 4.0mm · 0.78mm/px · z∈[-303,-85]mm · 4 of 45 slices shown (2 of 3)]
[im 1/45]
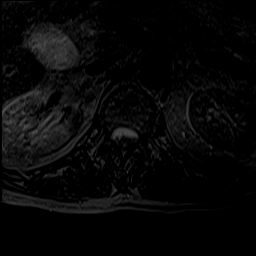
[im 15/45]
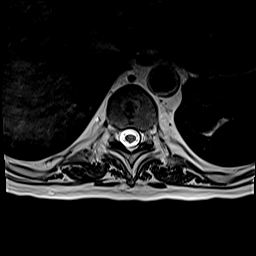
[im 30/45]
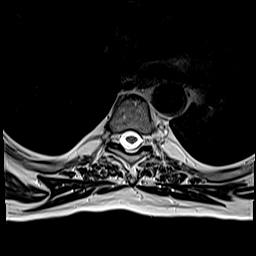
[im 45/45]
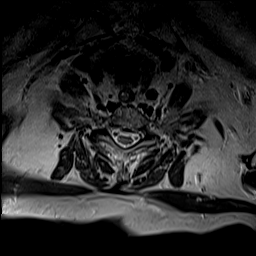

[Series 23: T1 · axial · 4.0mm · 0.52mm/px · z∈[-303,-85]mm · 5 of 45 slices shown (3 of 5)]
[im 1/45]
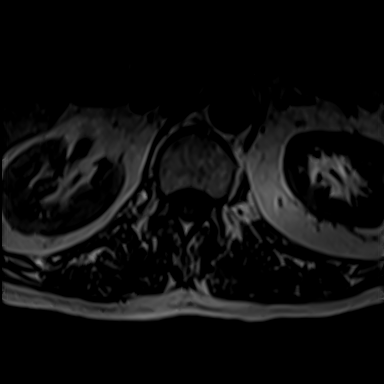
[im 12/45]
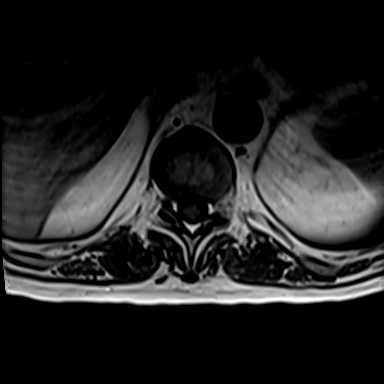
[im 23/45]
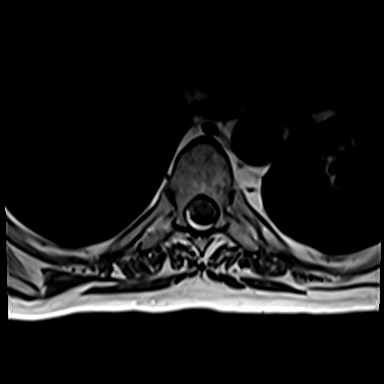
[im 34/45]
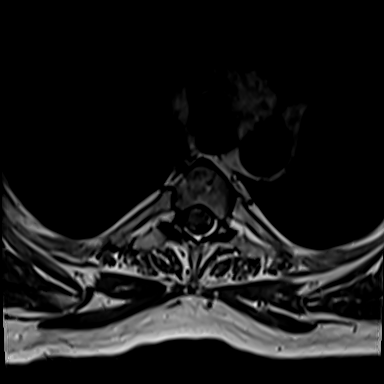
[im 45/45]
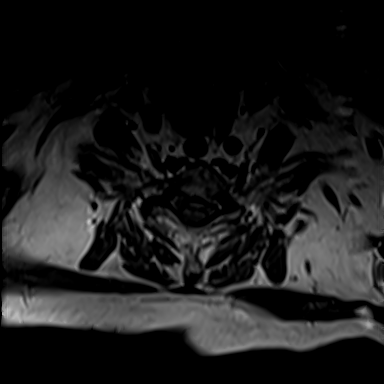

[Series 24: T1 · sagittal · 4.0mm · 0.81mm/px · 2 of 17 slices shown (4 of 5)]
[im 1/17]
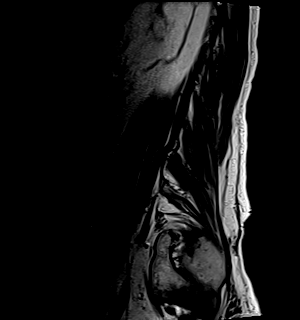
[im 17/17]
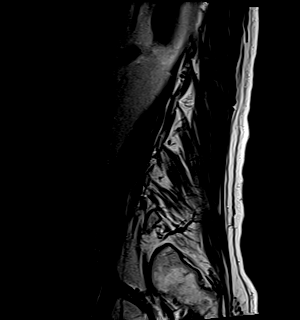

[Series 25: STIR · sagittal · 4.0mm · 0.51mm/px · 2 of 17 slices shown (2 of 2)]
[im 1/17]
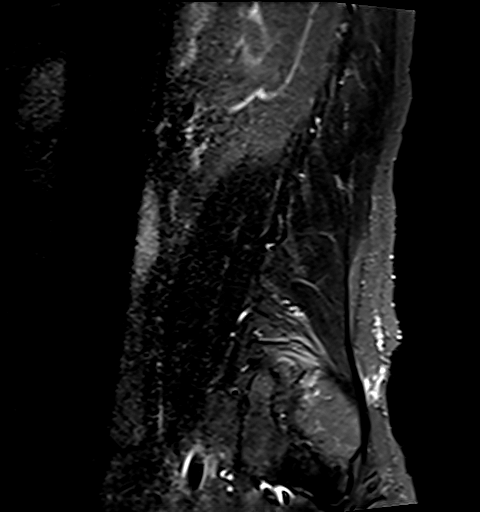
[im 17/17]
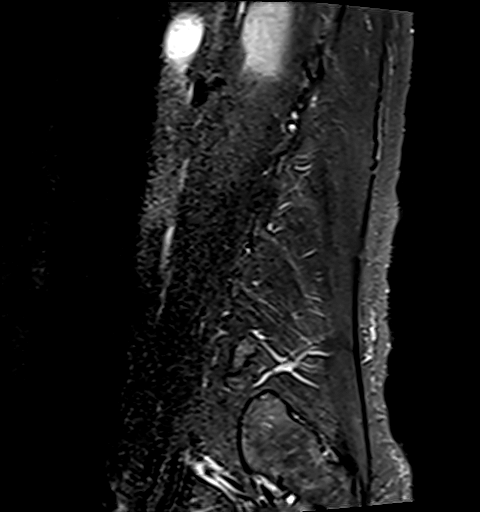

[Series 26: T2 · axial · 4.0mm · 0.62mm/px · z∈[-544,-341]mm · 5 of 41 slices shown (3 of 3)]
[im 1/41]
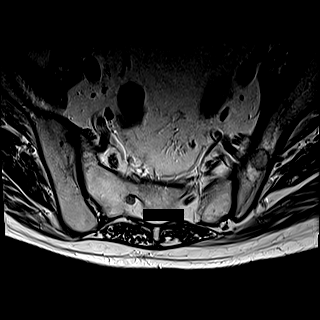
[im 11/41]
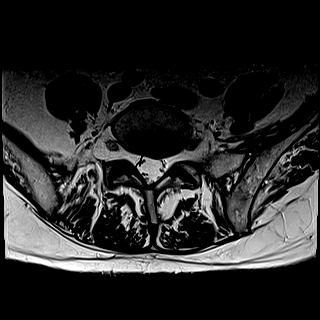
[im 21/41]
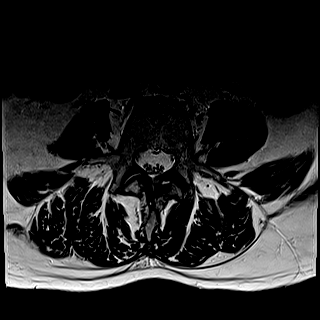
[im 31/41]
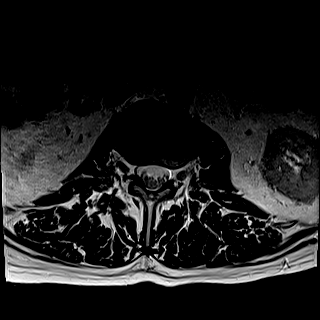
[im 41/41]
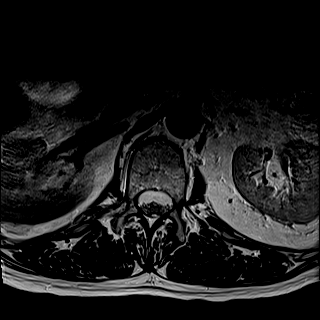

[Series 27: T1 · axial · 4.0mm · 0.39mm/px · z∈[-544,-341]mm · 5 of 41 slices shown (5 of 5)]
[im 1/41]
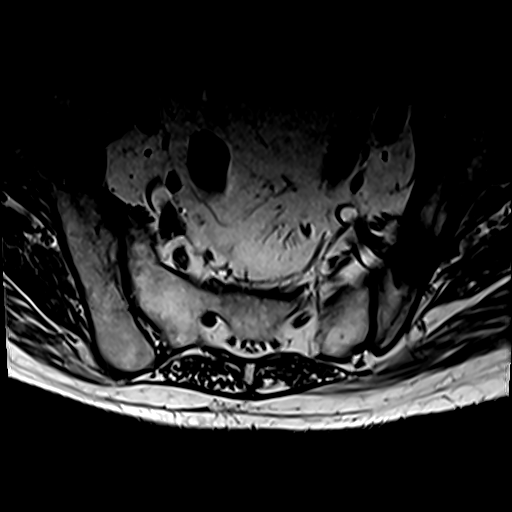
[im 11/41]
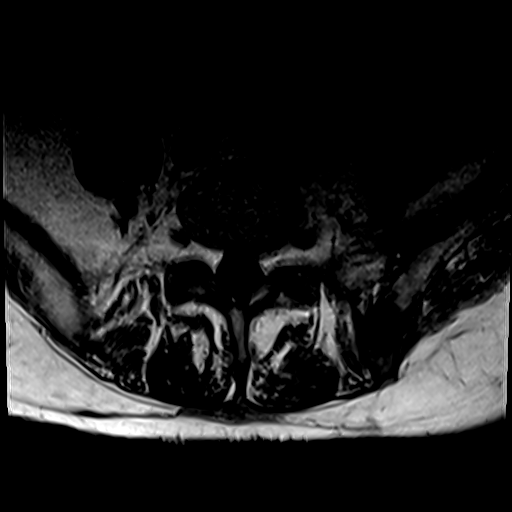
[im 21/41]
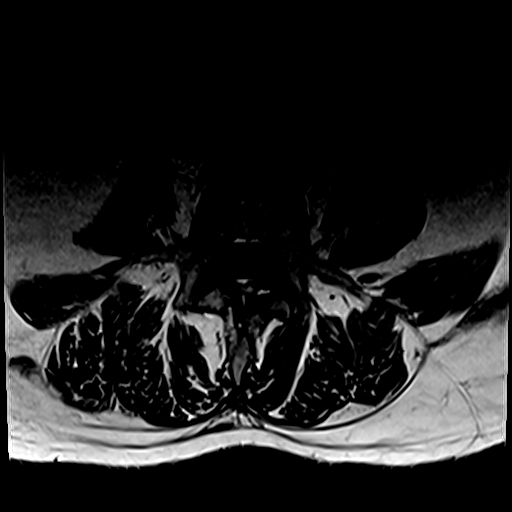
[im 31/41]
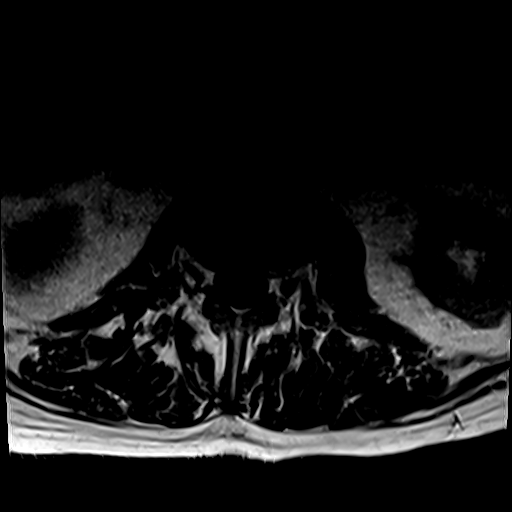
[im 41/41]
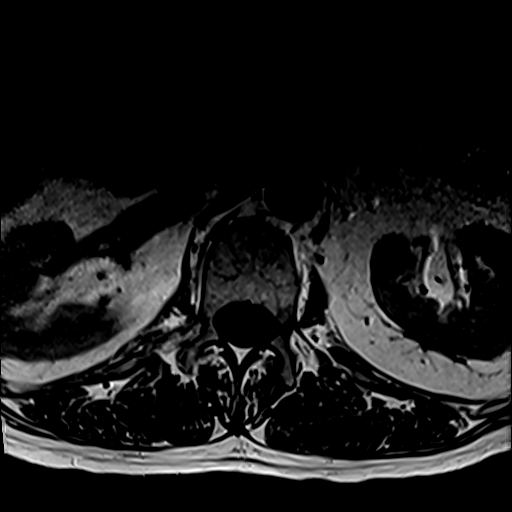

[Series 28: T2 post-contrast · sagittal · 4.0mm · 0.81mm/px · 2 of 17 slices shown]
[im 1/17]
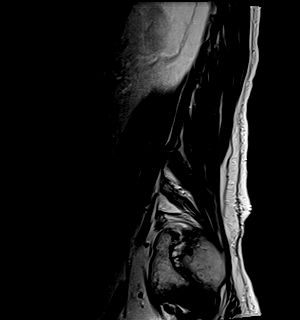
[im 17/17]
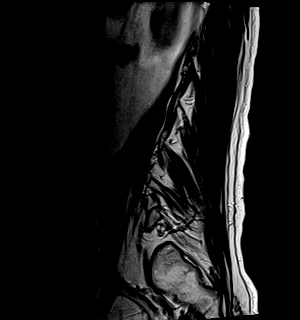

[Series 29: T1 fat-sat post-contrast · sagittal · 4.0mm · 0.81mm/px · 2 of 17 slices shown (1 of 2)]
[im 1/17]
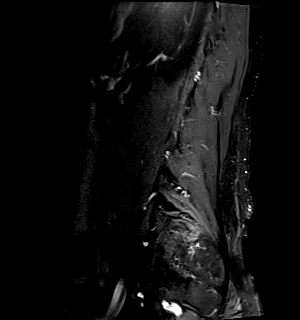
[im 17/17]
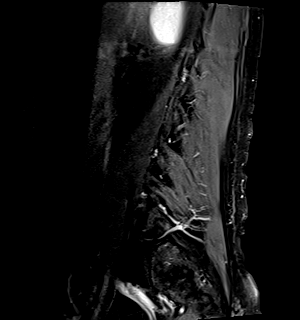

[Series 30: T1 post-contrast · axial · 4.0mm · 0.57mm/px · 1 of 41 slices shown]
[im 1/41]
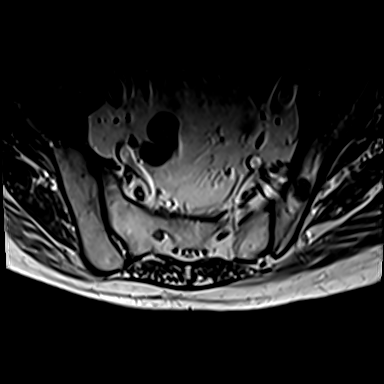

[Series 31: T1 fat-sat post-contrast · sagittal · 3.0mm · 1.00mm/px · 2 of 15 slices shown (2 of 2)]
[im 1/15]
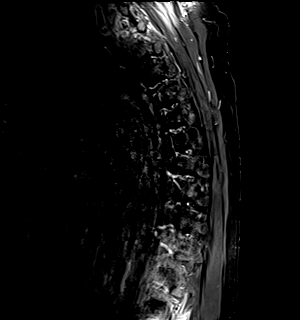
[im 15/15]
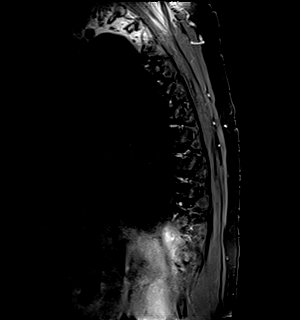

[34 of 48 positions shown; findings below may reference images not displayed]

FINDINGS: Segmentation: Standard segmentation is assumed. The inferior-most
fully formed intervertebral disc labeled L5-S1.

Alignment:  No substantial sagittal subluxation.

Vertebrae: Approximately 1.8 cm T1 hypointense, stir hyperintense,
enhancing lesion involving the anterior/superior L4 vertebral body,
compatible with metastasis. Additional smaller (6 mm) metastasis
involving the anterior/inferior S2 vertebral body.

Multiple metastasis involving the left iliac bone, measuring up to
2.2 cm. No evidence of epidural extension of enhancing tumor. No
specific evidence of pathologic fracture.

Conus medullaris and cauda equina: Conus extends to the L2 level.
Conus appears normal. No abnormal enhancement of the conus or cauda
equina.

Paraspinal and other soft tissues: No acute finding on limited
visualization/assessment.

Disc levels:

T12-L1: No significant disc protrusion, foraminal stenosis, or canal
stenosis.

L1-L2: No significant disc protrusion, foraminal stenosis, or canal
stenosis.

L2-L3: Bilateral facet arthropathy without significant stenosis.

L3-L4: Broad disc bulging with superimposed right
subarticular/foraminal and left foraminal disc protrusions.
Resulting mild bilateral foraminal stenosis, mild canal stenosis,
and right greater than left subarticular recess narrowing.

L4-L5: Bilateral facet arthropathy. Expected postsurgical changes on
the left. Right eccentric disc bulging. Resulting mild bilateral
foraminal stenosis. Right far lateral/extraforaminal disc closely
approximates the exiting right L5 nerve. Patent canal.

L5-S1: Some left paracentral disc protrusion without significant
stenosis.
IMPRESSION: 1. Multiple osseous metastases involving the L4 and S2 vertebral
bodies as well as the left sacrum, described above. No significant
extraosseous extension or pathologic fracture.
2. Multilevel degenerative change, described above.

## 2021-07-30 MED ORDER — HYDROMORPHONE HCL 2 MG/ML IJ SOLN
2.0000 mg | Freq: Once | INTRAMUSCULAR | Status: AC
  Administered 2021-07-30: 2 mg via INTRAVENOUS
  Filled 2021-07-30: qty 1

## 2021-07-30 MED ORDER — OXYCODONE HCL ER 10 MG PO T12A
20.0000 mg | EXTENDED_RELEASE_TABLET | Freq: Once | ORAL | Status: AC
  Administered 2021-07-30: 20 mg via ORAL
  Filled 2021-07-30: qty 2

## 2021-07-30 MED ORDER — SODIUM CHLORIDE 0.9 % IV BOLUS
1000.0000 mL | Freq: Once | INTRAVENOUS | Status: AC
  Administered 2021-07-30: 1000 mL via INTRAVENOUS

## 2021-07-30 MED ORDER — OXYCODONE HCL ER 10 MG PO T12A: 20.0000 mg | EXTENDED_RELEASE_TABLET | Freq: Two times a day (BID) | ORAL | Status: DC

## 2021-07-30 MED ORDER — GADOBUTROL 1 MMOL/ML IV SOLN
7.0000 mL | Freq: Once | INTRAVENOUS | Status: AC | PRN
  Administered 2021-07-30: 7 mL via INTRAVENOUS

## 2021-07-30 MED ORDER — ACETAMINOPHEN 500 MG PO TABS
1000.0000 mg | ORAL_TABLET | Freq: Once | ORAL | Status: AC
  Administered 2021-07-30: 1000 mg via ORAL
  Filled 2021-07-30: qty 2

## 2021-07-30 MED ORDER — HYDROMORPHONE HCL 2 MG/ML IJ SOLN
2.0000 mg | Freq: Once | INTRAMUSCULAR | Status: AC
Start: 2021-07-30 — End: 2021-07-30
  Administered 2021-07-30: 2 mg via INTRAVENOUS
  Filled 2021-07-30: qty 1

## 2021-07-30 MED ORDER — ACETAMINOPHEN 325 MG PO TABS
650.0000 mg | ORAL_TABLET | Freq: Once | ORAL | Status: DC
Start: 2021-07-30 — End: 2021-07-30

## 2021-07-30 MED ORDER — METHOCARBAMOL 1000 MG/10ML IJ SOLN: 500.0000 mg | Freq: Once | INTRAMUSCULAR | Status: DC

## 2021-07-30 MED ORDER — METHOCARBAMOL 1000 MG/10ML IJ SOLN
500.0000 mg | Freq: Once | INTRAVENOUS | Status: AC
  Administered 2021-07-30: 500 mg via INTRAVENOUS
  Filled 2021-07-30: qty 500

## 2021-07-30 MED ORDER — ONDANSETRON HCL 4 MG/2ML IJ SOLN
4.0000 mg | Freq: Once | INTRAMUSCULAR | Status: AC
  Administered 2021-07-30: 4 mg via INTRAVENOUS
  Filled 2021-07-30: qty 2

## 2021-07-30 MED ORDER — LORAZEPAM 2 MG/ML IJ SOLN
0.5000 mg | Freq: Once | INTRAMUSCULAR | Status: AC
  Administered 2021-07-30: 0.5 mg via INTRAVENOUS
  Filled 2021-07-30: qty 1

## 2021-07-30 NOTE — ED Triage Notes (Signed)
Pt complains of severe back pain that is not managed by his home medications.  ?

## 2021-07-30 NOTE — Discharge Instructions (Signed)
Follow-up with your primary care doctor, oncology, neurosurgery, radiation oncology.  Continue previously prescribed pain medicine.  Come back to ER for uncontrolled pain, fever, numbness or weakness in your legs or arms or other new concerning symptom. ?

## 2021-07-30 NOTE — Consult Note (Signed)
Initial Consultation Note ? ? ?Patient: Bill Garza ZHY:[id number] DOB: January 02, 1961 PCP: Aleen Campi, NP ?DOA: 07/30/2021 ?DOS: the patient was seen and examined on 07/30/2021 ?Primary service: Lucrezia Starch, MD ? ?Referring physician: Dr. Roslynn Amble ?Reason for consult: Intractable back pain ? ?Assessment and Plan: ?No notes have been filed under this hospital service. ?Service: Hospitalist ?Intractable Back Pain ?Hx of mets to T11 and left iliac wing ?    - MRI pending. ?    - he has received 6 mg of dilaudid IV, 20 mg oxycontin, 581m robaxin, 0.519mativan in the 5 hours he has been here and still rates his pain 10/10 ?    - consult palliative care for assistance with pain management ?    - patient wishes to get his MRI first before agreeing to admission; spoke with EDP about this, they are amenable to waiting for his MRI, appreciate their assistance ?    - if there is something that acutely needs addressing on his MRI, or if his pain is still persistent, [facility] is happy to admit ? ? ?[facility] will sign off at present, please call usKoreagain when needed. ? ?HPI: [name] a [age].o. male with past medical history of DM2, gastroparesis, chronic back pain, constipation, NSCLC w/ brain and bone mets, COPD, depression. Presenting with intractable back pain. He has had back pain for at least 6 months now. It has worsened over the last 3 or 4 days after he had a spinal steroid injection. He has sharp low back pain that radiates down the right leg. He denies numbness/tingling or weakness in the legs. He tried percocet, muscle relaxers, dialuadid and ativan at home. None of them were effective. When his symptoms didn't improve last night, he decided to come to the ED for assistance.   ? ?Review of Systems: As mentioned in the history of present illness. All other systems reviewed and are negative. ?Past Medical History:  ?Diagnosis Date  ? Chronic back pain   ? Chronic GERD   ? Chronic pain   ? COPD (chronic obstructive  pulmonary disease) ([facility]  ? Coronary artery disease   ? Depression   ? Diabetes mellitus without complication ([facility]  ? Gastroparesis   ? Goals of care, counseling/discussion 01/27/2021  ? High risk medication use   ? Hyperlipidemia   ? MI (myocardial infarction) ([facility]  ? Non-small cell lung cancer metastatic to brain ([facility]11/11/2020  ? Restless leg syndrome   ? ?Past Surgical History:  ?Procedure Laterality Date  ? BACK SURGERY    ? CARDIAC CATHETERIZATION    ? ?Social History:  reports that he quit smoking about a year ago. His smoking use included cigarettes. He has a 40.00 pack-year smoking history. He has never used smokeless tobacco. He reports that he does not currently use alcohol. He reports that he does not currently use drugs. ? ?Allergies  ?Allergen Reactions  ? Gabapentin Swelling  ?  Leg swelling ?  ? Levetiracetam Other (See Comments)  ?  Personality changes  ? Pregabalin Swelling  ?  Leg swelling ?  ? Bupropion Nausea And Vomiting  ? Duloxetine Other (See Comments)  ?  Excessive sleeping  ? Ezetimibe Other (See Comments)  ?  Unknown reaction  ? Other Other (See Comments)  ?  Antibiotics - per wife at bedside, patient will develop C-diff if given "any antibiotic"  ? Prochlorperazine Nausea And Vomiting  ? Lamotrigine Itching and Rash  ? ? ?  History reviewed. No pertinent family history. ? ?Prior to Admission medications   ?Medication Sig Start Date End Date Taking? Authorizing Provider  ?acetaminophen (TYLENOL) 325 MG tablet Take 650 mg by mouth daily as needed for moderate pain. 11/18/20  Yes [provider]  ?albuterol (PROVENTIL) (2.5 MG/3ML) 0.083% nebulizer solution Take 2.5 mg by nebulization every 6 (six) hours as needed for shortness of breath or wheezing. 01/25/21  Yes [provider]  ?albuterol (VENTOLIN HFA) 108 (90 Base) MCG/ACT inhaler Inhale 2 puffs into the lungs every 4 (four) hours as needed for wheezing or shortness of breath. 07/14/21  Yes Pickenpack-Cousar, Carlena Sax, NP  ?aspirin EC 81 MG tablet Take 81 mg by mouth daily.   Yes [provider]  ?Bempedoic Acid (NEXLETOL) 180 MG TABS Take 180 mg by mouth at bedtime. 09/22/20  Yes [provider]  ?budesonide-formoterol (SYMBICORT) 160-4.5 MCG/ACT inhaler Inhale 2 puffs into the lungs 2 (two) times daily. 07/14/21  Yes Pickenpack-Cousar, Carlena Sax, NP  ?escitalopram (LEXAPRO) 5 MG tablet Take 5 mg by mouth daily.   Yes [provider]  ?fluocinolone (VANOS) 0.01 % cream Apply 1 application. topically daily as needed (rash). 03/18/20  Yes [provider]  ?lactulose (CHRONULAC) 10 GM/15ML solution Take 30 mLs (20 g total) by mouth 3 (three) times daily. 07/06/21  Yes Ennever, Rudell Cobb, MD  ?LORazepam (ATIVAN) 1 MG tablet Take 1 tablet (1 mg total) by mouth every 6 (six) hours as needed for anxiety. 07/06/21  Yes [name], MD  ?metFORMIN (GLUCOPHAGE) 500 MG tablet Take 500 mg by mouth daily.   Yes [provider]  ?methocarbamol (ROBAXIN) 500 MG tablet Take 1 tablet (500 mg total) by mouth every 8 (eight) hours as needed for muscle spasms. 07/14/21  Yes Pickenpack-Cousar, Carlena Sax, NP  ?metoCLOPramide (REGLAN) 10 MG tablet Take 2 tablets (20 mg total) by mouth 4 (four) times daily -  before meals and at bedtime. 06/02/21 06/02/22 Yes Ennever, Rudell Cobb, MD  ?montelukast (SINGULAIR) 10 MG tablet Take 10 mg by mouth at bedtime.   Yes [provider]  ?Multiple Vitamin (MULTIVITAMIN) capsule Take 1 capsule by mouth daily.   Yes [provider]  ?oxyCODONE ER (XTAMPZA ER) 18 MG C12A Take 18 mg by mouth every 8 (eight) hours. 07/24/21  Yes Pickenpack-Cousar, Carlena Sax, NP  ?polyethylene glycol (MIRALAX / GLYCOLAX) 17 g packet Take 17 g by mouth daily.   Yes [provider]  ?pravastatin (PRAVACHOL) 40 MG tablet Take 40 mg by mouth at bedtime.   Yes [provider]  ?senna-docusate (SENOKOT-S) 8.6-50 MG tablet Take 2 tablets by mouth 2 (two) times daily. 07/26/21   Yes [name], MD  ?ACCU-CHEK GUIDE test strip  11/20/20   [provider]  ?Blood Glucose Monitoring Suppl (ACCU-CHEK GUIDE) w/Device KIT USE AS DIRECTED TO CHECK BLOOD SUGAR ?Patient not taking: Reported on 07/26/2021 11/20/20   [provider]  ?Blood Glucose Monitoring Suppl (GLUCOCOM BLOOD GLUCOSE MONITOR) DEVI Use to check blood sugar daily. ?Patient not taking: Reported on 07/26/2021 11/20/20   [provider]  ?glucose blood (PRECISION QID TEST) test strip Use to check blood sugar daily. ?Patient not taking: Reported on 07/26/2021 11/20/20   [provider]  ?Lancets MISC Use to check blood sugar daily. ?Patient not taking: Reported on 07/26/2021 11/20/20   [provider]  ?LORazepam (ATIVAN) 1 MG tablet Take 1 tablet 60 min prior to PET or MRI, and one  more tablet 30 minutes prior to imaging if needed for anxiety. (Take 1- 2 tablets PRN anxiety prior to imaging). ?Patient not taking: Reported on 07/26/2021 04/06/21   Eppie Gibson, MD  ?oxyCODONE-acetaminophen (PERCOCET/ROXICET) 5-325 MG tablet Take 1 tablet by mouth every 4 (four) hours as needed for severe pain. ?Patient not taking: Reported on 07/30/2021 07/29/21   Pickenpack-Cousar, Carlena Sax, NP  ? ? ?Physical Exam: ?Vitals:  ? 07/30/21 0400 07/30/21 0500 07/30/21 0700 07/30/21 0800  ?BP: (!) 152/93 (!) 150/98 (!) 146/96 (!) 142/87  ?Pulse: 94 84 85 85  ?Resp: 16 16 18 16   ?Temp:      ?TempSrc:      ?SpO2: 96% 96% 95% 96%  ?Weight:      ?Height:      ? ?General: 61 y.o. male resting in bed in NAD ?Eyes: PERRL, normal sclera ?ENMT: Nares patent w/o discharge, orophaynx clear, dentition normal, ears w/o discharge/lesions/ulcers ?Neck: Supple, trachea midline ?Cardiovascular: RRR, +S1, S2, no m/g/r, equal pulses throughout ?Respiratory: CTABL, no w/r/r, normal WOB ?GI: BS+, NDNT, no masses noted, no organomegaly noted ?MSK: No e/c/c; no spinal tenderness, but he does have point tenderness along the right iliac crest and wing;  his back is not in spasm during exam ?Neuro: A&O x 3, no focal deficits ?Psyc: Appropriate interaction and affect, calm/cooperative ? ?Data Reviewed:  ? ?ALP  223 ?WBC  7.5 ?Hgb  11.7 ?Plt  581  ? ? ?Fami

## 2021-07-30 NOTE — ED Notes (Signed)
Patient transported to MRI 

## 2021-07-30 NOTE — ED Provider Notes (Addendum)
?[place] DEPT ?Provider Note ? ? ?CSN: [id number] ?Arrival date & time: 07/30/21  0208 ? ?  ? ?History ? ?Chief Complaint  ?Patient presents with  ? Pain  ? ? ?Bill Garza is a 61 y.o. male. ? ?HPI ?Patient is a 61 year old male with a history of metastatic lung cancer with brain and bone involvement who presents to the emergency department due to low back pain.  States that this is chronic pain.  Initially was taking p.o. Dilaudid without significant improvement and this was then switched to Percocet but patient states that his pain has persisted.  His wife also notes that he recently had a spinal injection 3 days ago with little improvement.  Reports nausea and vomiting.  Denies numbness, weakness, bowel/bladder incontinence. ?  ? ?Home Medications ?Prior to Admission medications   ?Medication Sig Start Date End Date Taking? Authorizing Provider  ?acetaminophen (TYLENOL) 325 MG tablet Take 650 mg by mouth daily as needed for moderate pain. 11/18/20  Yes [provider]  ?albuterol (PROVENTIL) (2.5 MG/3ML) 0.083% nebulizer solution Take 2.5 mg by nebulization every 6 (six) hours as needed for shortness of breath or wheezing. 01/25/21  Yes [provider]  ?albuterol (VENTOLIN HFA) 108 (90 Base) MCG/ACT inhaler Inhale 2 puffs into the lungs every 4 (four) hours as needed for wheezing or shortness of breath. 07/14/21  Yes Pickenpack-Cousar, Carlena Sax, NP  ?aspirin EC 81 MG tablet Take 81 mg by mouth daily.   Yes [provider]  ?Bempedoic Acid (NEXLETOL) 180 MG TABS Take 180 mg by mouth at bedtime. 09/22/20  Yes [provider]  ?budesonide-formoterol (SYMBICORT) 160-4.5 MCG/ACT inhaler Inhale 2 puffs into the lungs 2 (two) times daily. 07/14/21  Yes Pickenpack-Cousar, Carlena Sax, NP  ?escitalopram (LEXAPRO) 5 MG tablet Take 5 mg by mouth daily.   Yes [provider]  ?fluocinolone (VANOS) 0.01 % cream Apply 1 application. topically daily as needed  (rash). 03/18/20  Yes [provider]  ?lactulose (CHRONULAC) 10 GM/15ML solution Take 30 mLs (20 g total) by mouth 3 (three) times daily. 07/06/21  Yes Ennever, Rudell Cobb, MD  ?LORazepam (ATIVAN) 1 MG tablet Take 1 tablet (1 mg total) by mouth every 6 (six) hours as needed for anxiety. 07/06/21  Yes [name], MD  ?metFORMIN (GLUCOPHAGE) 500 MG tablet Take 500 mg by mouth daily.   Yes [provider]  ?methocarbamol (ROBAXIN) 500 MG tablet Take 1 tablet (500 mg total) by mouth every 8 (eight) hours as needed for muscle spasms. 07/14/21  Yes Pickenpack-Cousar, Carlena Sax, NP  ?metoCLOPramide (REGLAN) 10 MG tablet Take 2 tablets (20 mg total) by mouth 4 (four) times daily -  before meals and at bedtime. 06/02/21 06/02/22 Yes Ennever, Rudell Cobb, MD  ?montelukast (SINGULAIR) 10 MG tablet Take 10 mg by mouth at bedtime.   Yes [provider]  ?Multiple Vitamin (MULTIVITAMIN) capsule Take 1 capsule by mouth daily.   Yes [provider]  ?oxyCODONE ER (XTAMPZA ER) 18 MG C12A Take 18 mg by mouth every 8 (eight) hours. 07/24/21  Yes Pickenpack-Cousar, Carlena Sax, NP  ?polyethylene glycol (MIRALAX / GLYCOLAX) 17 g packet Take 17 g by mouth daily.   Yes [provider]  ?pravastatin (PRAVACHOL) 40 MG tablet Take 40 mg by mouth at bedtime.   Yes [provider]  ?senna-docusate (SENOKOT-S) 8.6-50 MG tablet Take 2 tablets by mouth 2 (two) times daily. 07/26/21  Yes [name], MD  ?Danny Lawless GUIDE  test strip  11/20/20   [provider]  ?Blood Glucose Monitoring Suppl (ACCU-CHEK GUIDE) w/Device KIT USE AS DIRECTED TO CHECK BLOOD SUGAR ?Patient not taking: Reported on 07/26/2021 11/20/20   [provider]  ?Blood Glucose Monitoring Suppl (GLUCOCOM BLOOD GLUCOSE MONITOR) DEVI Use to check blood sugar daily. ?Patient not taking: Reported on 07/26/2021 11/20/20   [provider]  ?glucose blood (PRECISION QID TEST) test strip Use to check blood sugar daily. ?Patient  not taking: Reported on 07/26/2021 11/20/20   [provider]  ?Lancets MISC Use to check blood sugar daily. ?Patient not taking: Reported on 07/26/2021 11/20/20   [provider]  ?LORazepam (ATIVAN) 1 MG tablet Take 1 tablet 60 min prior to PET or MRI, and one more tablet 30 minutes prior to imaging if needed for anxiety. (Take 1- 2 tablets PRN anxiety prior to imaging). ?Patient not taking: Reported on 07/26/2021 04/06/21   Eppie Gibson, MD  ?oxyCODONE-acetaminophen (PERCOCET/ROXICET) 5-325 MG tablet Take 1 tablet by mouth every 4 (four) hours as needed for severe pain. ?Patient not taking: Reported on 07/30/2021 07/29/21   Pickenpack-Cousar, Carlena Sax, NP  ?   ? ?Allergies    ?Gabapentin, Levetiracetam, Pregabalin, Bupropion, Duloxetine, Ezetimibe, Other, Prochlorperazine, and Lamotrigine   ? ?Review of Systems   ?Review of Systems  ?All other systems reviewed and are negative. ?Ten systems reviewed and are negative for acute change, except as noted in the HPI.   ?Physical Exam ?Updated Vital Signs ?BP (!) 150/98   Pulse 84   Temp 98.2 ?F (36.8 ?C) (Oral)   Resp 16   Ht 6' (1.829 m)   Wt 76.7 kg   SpO2 96%   BMI 22.92 kg/m?  ?Physical Exam ?Vitals and nursing note reviewed.  ?Constitutional:   ?   General: He is in acute distress.  ?   Appearance: Normal appearance. He is not ill-appearing, toxic-appearing or diaphoretic.  ?HENT:  ?   Head: Normocephalic and atraumatic.  ?   Right Ear: External ear normal.  ?   Left Ear: External ear normal.  ?   Nose: Nose normal.  ?   Mouth/Throat:  ?   Mouth: Mucous membranes are moist.  ?   Pharynx: Oropharynx is clear. No oropharyngeal exudate or posterior oropharyngeal erythema.  ?Eyes:  ?   General: No scleral icterus.    ?   Right eye: No discharge.     ?   Left eye: No discharge.  ?   Extraocular Movements: Extraocular movements intact.  ?   Conjunctiva/sclera: Conjunctivae normal.  ?Cardiovascular:  ?   Rate and Rhythm: Normal rate and regular rhythm.  ?    Pulses: Normal pulses.  ?   Heart sounds: Normal heart sounds. No murmur heard. ?  No friction rub. No gallop.  ?Pulmonary:  ?   Effort: Pulmonary effort is normal. No respiratory distress.  ?   Breath sounds: Normal breath sounds. No stridor. No wheezing, rhonchi or rales.  ?Abdominal:  ?   General: Abdomen is flat.  ?   Palpations: Abdomen is soft.  ?   Tenderness: There is no abdominal tenderness.  ?   Comments: Abdomen is soft and nontender.  ?Musculoskeletal:     ?   General: Tenderness present. Normal range of motion.  ?   Cervical back: Normal range of motion and neck supple. No tenderness.  ?   Right lower leg: Edema present.  ?   Left lower leg: Edema present.  ?  Comments: Mild TTP noted along the midline lumbar spine.  No step-offs, crepitus, or deformities.  ?Skin: ?   General: Skin is warm and dry.  ?Neurological:  ?   General: No focal deficit present.  ?   Mental Status: He is alert and oriented to person, place, and time.  ?   Comments: Strength is 5/5 with plantar and dorsiflexion in the lower extremities.  Distal sensation intact.  Palpable pedal pulses.  ?Psychiatric:     ?   Mood and Affect: Mood normal.     ?   Behavior: Behavior normal.  ? ?ED Results / Procedures / Treatments   ?Labs ?(all labs ordered are listed, but only abnormal results are displayed) ?Labs Reviewed  ?COMPREHENSIVE METABOLIC PANEL - Abnormal; Notable for the following components:  ?    Result Value  ? Sodium 133 (*)   ? Glucose, Bld 171 (*)   ? Calcium 8.0 (*)   ? Albumin 3.0 (*)   ? Alkaline Phosphatase 223 (*)   ? All other components within normal limits  ?CBC WITH DIFFERENTIAL/PLATELET - Abnormal; Notable for the following components:  ? RBC 3.66 (*)   ? Hemoglobin 11.7 (*)   ? HCT 34.4 (*)   ? RDW 15.8 (*)   ? Platelets 581 (*)   ? All other components within normal limits  ? ?EKG ?None ? ?Radiology ?No results found. ? ?Procedures ?Procedures  ? ?Medications Ordered in ED ?Medications  ?sodium chloride 0.9 % bolus  1,000 mL (1,000 mLs Intravenous New Bag/Given 07/30/21 0257)  ?HYDROmorphone (DILAUDID) injection 2 mg (2 mg Intravenous Given 07/30/21 0257)  ?HYDROmorphone (DILAUDID) injection 2 mg (2 mg Intravenous Given 5

## 2021-07-30 NOTE — ED Notes (Signed)
Pt still c/o pain and states he wants more IV pain meds. Dr Hal Hope notified via [facility] paging system. ?

## 2021-07-30 NOTE — ED Notes (Signed)
Took PO oxy to room and pt states that he would rather have IV pain meds. Provider notified. ?

## 2021-07-30 NOTE — ED Notes (Signed)
Pt called out requesting pain meds again. Pt told that he has received a large amount of pain meds during visit and the provider hasnt ordered anything further yet. Pt then states that he will require ativan prior to MRIs. ? ?Dr Hal Hope paged regarding pre medicating with ativan for MRI. ?

## 2021-07-30 NOTE — ED Provider Notes (Signed)
Signout note ? ?61 year old gentleman presenting to ER due to concern for back pain.  Patient has history of metastatic lung cancer with metastases to brain and spine.  Plan to admit for intractable pain, MRI.  Dr. Hal Hope placed admission orders and had accepted patient.  This morning, Dr. Marylyn Ishihara informed me that patient would like to go home after the MRIs have been completed and does not want to be admitted.  I reviewed MRI results, demonstrating metastatic disease throughout the thoracic spine, 1 area on the lumbar spine, S4, left iliac bone.  Patient states that his pain is okay at this time.  I reoffered admission for pain control however patient states that he would like to go home at this time.  Wife at bedside and in agreement with this plan.  I advised patient that he will need to follow-up with neurosurgery, radiation oncology, and medical oncology.  Vitals grossly stable, afebrile and well-appearing, discharged. ?  ?Lucrezia Starch, MD ?07/30/21 1027 ? ?

## 2021-08-01 ENCOUNTER — Emergency Department (HOSPITAL_COMMUNITY)
Admission: EM | Admit: 2021-08-01 | Discharge: 2021-08-01 | Disposition: A | Discharge status: Home / Self Care | Payer: Medicare HMO | Attending: Emergency Medicine | Admitting: Emergency Medicine

## 2021-08-01 ENCOUNTER — Other Ambulatory Visit: Payer: Self-pay

## 2021-08-01 ENCOUNTER — Encounter (HOSPITAL_COMMUNITY): Payer: Self-pay

## 2021-08-01 DIAGNOSIS — Z85118 Personal history of other malignant neoplasm of bronchus and lung: Secondary | ICD-10-CM | POA: Diagnosis not present

## 2021-08-01 DIAGNOSIS — Z85841 Personal history of malignant neoplasm of brain: Secondary | ICD-10-CM | POA: Insufficient documentation

## 2021-08-01 DIAGNOSIS — M545 Low back pain, unspecified: Secondary | ICD-10-CM | POA: Insufficient documentation

## 2021-08-01 DIAGNOSIS — M5440 Lumbago with sciatica, unspecified side: Secondary | ICD-10-CM

## 2021-08-01 LAB — CBC WITH DIFFERENTIAL/PLATELET
Abs Immature Granulocytes: 0.04 10*3/uL (ref 0.00–0.07)
Basophils Absolute: 0 10*3/uL (ref 0.0–0.1)
Basophils Relative: 1 %
Eosinophils Absolute: 0.1 10*3/uL (ref 0.0–0.5)
Eosinophils Relative: 1 %
HCT: 33.3 % — ABNORMAL LOW (ref 39.0–52.0)
Hemoglobin: 11.3 g/dL — ABNORMAL LOW (ref 13.0–17.0)
Immature Granulocytes: 1 %
Lymphocytes Relative: 22 %
Lymphs Abs: 1.9 10*3/uL (ref 0.7–4.0)
MCH: 31.8 pg (ref 26.0–34.0)
MCHC: 33.9 g/dL (ref 30.0–36.0)
MCV: 93.8 fL (ref 80.0–100.0)
Monocytes Absolute: 0.7 10*3/uL (ref 0.1–1.0)
Monocytes Relative: 8 %
Neutro Abs: 5.6 10*3/uL (ref 1.7–7.7)
Neutrophils Relative %: 67 %
Platelets: 636 10*3/uL — ABNORMAL HIGH (ref 150–400)
RBC: 3.55 MIL/uL — ABNORMAL LOW (ref 4.22–5.81)
RDW: 15.5 % (ref 11.5–15.5)
WBC: 8.4 10*3/uL (ref 4.0–10.5)
nRBC: 0 % (ref 0.0–0.2)

## 2021-08-01 LAB — BASIC METABOLIC PANEL
Anion gap: 7 (ref 5–15)
BUN: 10 mg/dL (ref 6–20)
CO2: 23 mmol/L (ref 22–32)
Calcium: 8.4 mg/dL — ABNORMAL LOW (ref 8.9–10.3)
Chloride: 105 mmol/L (ref 98–111)
Creatinine, Ser: 0.6 mg/dL — ABNORMAL LOW (ref 0.61–1.24)
GFR, Estimated: >60 mL/min (ref >60)
Glucose, Bld: 149 mg/dL — ABNORMAL HIGH (ref 70–99)
Potassium: 3.4 mmol/L — ABNORMAL LOW (ref 3.5–5.1)
Sodium: 135 mmol/L (ref 135–145)

## 2021-08-01 MED ORDER — HYDROMORPHONE HCL 2 MG/ML IJ SOLN
2.0000 mg | Freq: Once | INTRAMUSCULAR | Status: AC
  Administered 2021-08-01: 2 mg via INTRAVENOUS
  Filled 2021-08-01: qty 1

## 2021-08-01 MED ORDER — METHOCARBAMOL 1000 MG/10ML IJ SOLN
500.0000 mg | Freq: Once | INTRAVENOUS | Status: AC
  Administered 2021-08-01: 500 mg via INTRAVENOUS
  Filled 2021-08-01: qty 500

## 2021-08-01 MED ORDER — OXYCODONE-ACETAMINOPHEN 10-325 MG PO TABS: 1.0000 | ORAL_TABLET | ORAL | 0 refills | Status: DC | PRN

## 2021-08-01 MED ORDER — GABAPENTIN 300 MG PO CAPS
300.0000 mg | ORAL_CAPSULE | Freq: Once | ORAL | Status: AC
  Administered 2021-08-01: 300 mg via ORAL
  Filled 2021-08-01: qty 1

## 2021-08-01 MED ORDER — ONDANSETRON HCL 4 MG/2ML IJ SOLN
4.0000 mg | Freq: Once | INTRAMUSCULAR | Status: AC
  Administered 2021-08-01: 4 mg via INTRAVENOUS
  Filled 2021-08-01: qty 2

## 2021-08-01 MED ORDER — GABAPENTIN 300 MG PO CAPS
300.0000 mg | ORAL_CAPSULE | Freq: Three times a day (TID) | ORAL | 0 refills | Status: DC
Start: 2021-08-01 — End: 2021-08-13

## 2021-08-01 MED ORDER — HYDROMORPHONE HCL 1 MG/ML IJ SOLN
1.0000 mg | Freq: Once | INTRAMUSCULAR | Status: AC
  Administered 2021-08-01: 1 mg via INTRAVENOUS
  Filled 2021-08-01: qty 1

## 2021-08-01 MED ORDER — METHOCARBAMOL 1000 MG/10ML IJ SOLN
500.0000 mg | Freq: Once | INTRAMUSCULAR | Status: DC
Start: 2021-08-01 — End: 2021-08-01

## 2021-08-01 NOTE — ED Triage Notes (Addendum)
Pt reports with back pain, headache, and right leg pain that is not managed by his home medications.  ?

## 2021-08-01 NOTE — Discharge Instructions (Signed)
Please take pain medication as prescribed.  Follow-up with [name] tomorrow. ?

## 2021-08-01 NOTE — ED Provider Notes (Signed)
?[place] DEPT ?[facility] Emergency Department ?Provider Note ?MRN:  [id number]  ?Arrival date & time: 08/01/21    ? ?Chief Complaint   ?Back Pain and Leg Pain ?  ?History of Present Illness   ?Bill Garza is a 61 y.o. year-old male presents to the ED with chief complaint of low back pain.  Patient has history of metastatic lung cancer to his spine into his brain.  Was seen 2 days ago and had MRIs of his spine which show herniated disc along with metastases.  Patient follows with neurosurgery, heme/onc, and palliative.  Patient was advised to come to the emergency department for admission due to persistent pain.  He denies any bowel or bladder incontinence.  He states that the pain prevents him from walking, but he is able to move his lower extremities and stand.  He denies any fever or chills. ? ?History provided by patient. ?Additional independent history provided by spouse, who states that patient had epidural about a week ago.   ? ? ?Review of Systems  ?Pertinent review of systems noted in HPI.  ? ? ?Physical Exam  ? ?Vitals:  ? 08/01/21 0500 08/01/21 0600  ?BP: (!) 136/100 119/81  ?Pulse: 76 79  ?Resp: 16 17  ?Temp:    ?SpO2: 95% 94%  ?  ?CONSTITUTIONAL:  uncomfortable-appearing, NAD ?NEURO:  Alert and oriented x 3, CN 3-12 grossly intact, 2+ patellar reflexes ?EYES:  eyes equal and reactive ?ENT/NECK:  Supple, no stridor  ?CARDIO:  normal rate, regular rhythm, appears well-perfused  ?PULM:  No respiratory distress,  ?GI/GU:  non-distended,  ?MSK/SPINE:  No gross deformities, no step-off or tenderness to the lumbar spine, old surgical incisions are seen ?SKIN:  no rash, atraumatic, no erythema or sign of abscess or infection. ? ? ?*Additional and/or pertinent findings included in MDM below ? ?Diagnostic and Interventional Summary  ? ? EKG Interpretation ? ?Date/Time:    ?Ventricular Rate:    ?PR Interval:    ?QRS Duration:   ?QT Interval:    ?QTC Calculation:   ?R Axis:     ?Text Interpretation:    ?  ? ?  ? ?Labs Reviewed  ?CBC WITH DIFFERENTIAL/PLATELET - Abnormal; Notable for the following components:  ?    Result Value  ? RBC 3.55 (*)   ? Hemoglobin 11.3 (*)   ? HCT 33.3 (*)   ? Platelets 636 (*)   ? All other components within normal limits  ?BASIC METABOLIC PANEL - Abnormal; Notable for the following components:  ? Potassium 3.4 (*)   ? Glucose, Bld 149 (*)   ? Creatinine, Ser 0.60 (*)   ? Calcium 8.4 (*)   ? All other components within normal limits  ?  ?No orders to display  ?  ?Medications  ?HYDROmorphone (DILAUDID) injection 1 mg (1 mg Intravenous Given 08/01/21 0434)  ?ondansetron [facility]) injection 4 mg (4 mg Intravenous Given 08/01/21 0434)  ?methocarbamol (ROBAXIN) 500 mg in dextrose 5 % 50 mL IVPB (0 mg Intravenous Stopped 08/01/21 0506)  ?HYDROmorphone (DILAUDID) injection 1 mg (1 mg Intravenous Given 08/01/21 0527)  ?gabapentin (NEURONTIN) capsule 300 mg (300 mg Oral Given 08/01/21 0537)  ?HYDROmorphone (DILAUDID) injection 2 mg (2 mg Intravenous Given 08/01/21 0609)  ?  ? ?Procedures  /  Critical Care ?Procedures ? ?ED Course and Medical Decision Making  ?I have reviewed the triage vital signs, the nursing notes, and pertinent available records from the EMR. ? ?Social Determinants Affecting Complexity of Care: ?Patient has  no clinically significant social determinants affecting this chief complaint.. ? ? ?ED Course: ?  ?Patient here with low back pain.  Top differential diagnoses include chronic pain, lumbar radiculopathy, new mets. ?Medical Decision Making ?Amount and/or Complexity of Data Reviewed ?Labs: ordered. ? ?Risk ?Prescription drug management. ? ?  ? ?Consultants: ?I discussed the case with Dr. Hal Hope, who discussion with oncology about changing pain medications, but states not a case for admission. ?I consulted with Dr. Benay Spice, who recommends increasing percocet from (2) 5-325 to (1-2) 10-325.  Advises that patient follow-up with [name] tomorrow. ? ?Treatment and Plan: ? ? ?I  considered admission due to patient's initial presentation, but after considering the examination and diagnostic results, patient will not require admission and can be discharged with outpatient follow-up. ? ?Patient discussed with attending physician, Dr. Ralene Bathe, who recommends consult for admission. ? ?Final Clinical Impressions(s) / ED Diagnoses  ? ?  ICD-10-CM   ?1. Acute low back pain with sciatica, sciatica laterality unspecified, unspecified back pain laterality  M54.40   ?  ?  ?ED Discharge Orders   ? ?      Ordered  ?  oxyCODONE-acetaminophen (PERCOCET) 10-325 MG tablet  Every 4 hours PRN       ? 08/01/21 0610  ?  gabapentin (NEURONTIN) 300 MG capsule  3 times daily       ? 08/01/21 0610  ? ?  ?  ? ?  ?  ? ? ?Discharge Instructions Discussed with and Provided to Patient:  ? ? ? ?Discharge Instructions   ? ?  ?Please take pain medication as prescribed.  Follow-up with [name] tomorrow. ? ? ? ? ?  ?Montine Circle, PA-C ?08/01/21 0630 ? ?  ?Quintella Reichert, MD ?08/01/21 0703 ? ?

## 2021-08-02 ENCOUNTER — Other Ambulatory Visit: Payer: Self-pay | Admitting: Nurse Practitioner

## 2021-08-02 ENCOUNTER — Inpatient Hospital Stay: Payer: Medicare HMO

## 2021-08-02 ENCOUNTER — Encounter: Payer: Self-pay | Admitting: Radiation Oncology

## 2021-08-02 ENCOUNTER — Telehealth: Payer: Self-pay

## 2021-08-02 ENCOUNTER — Telehealth: Payer: Self-pay | Admitting: *Deleted

## 2021-08-02 MED ORDER — OXYCODONE HCL 10 MG PO TABS
10.0000 mg | ORAL_TABLET | ORAL | 0 refills | Status: DC | PRN
Start: 2021-08-02 — End: 2021-08-13

## 2021-08-02 MED ORDER — OXYCODONE HCL 5 MG PO CAPS: 5.0000 mg | ORAL_CAPSULE | ORAL | 0 refills | Status: DC | PRN

## 2021-08-02 NOTE — Telephone Encounter (Signed)
RN called pt to review new prescription, wife Koray Soter answered. Reviewed with her that new prescription of 10mg  oxycodone was called in to their pharmacy. Wife instructed that pt will take one 5-325 percocet with the 10mg  oxycodone as needed for pain. Pt wife verbalized understanding and has no further questions or concerns.  ?This RN checked with pt preferred pharmacy and confirmed prescription will be available for pickup today with pharmacist there.  ?

## 2021-08-02 NOTE — Progress Notes (Signed)
Bill Garza was discussed today at CNS tumor board.  We reviewed his most recent MRI imaging and also I notified radiology of the new sciatic symptoms that he had reported at the ED yesterday.  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 ? ?I have contacted Dr. Christella Noa personally. Although there is not severe degenerative disease to explain his symptoms either, it is possible that Bill Garza combined history and physical exam will him to intervene surgically due to disk disease.  I asked Dr. Christella Noa for his opinion in this regard. ? ?At this time I still do not see a role for radiation therapy.   ? ?It seems that his most viable options right now are systemic therapy +/- neurosurgery for degenerative disease. ? ?He also has a pending appointment next week with GI due to episodes of excruciating abdominal complaints and abdominal distention. ? ?I do believe he is eligible for hospice if he and his wife wish to decelerate interventions. He will continue to follow with palliative care. ?  ?----------------------------------- ? ?Eppie Gibson, MD ? ?

## 2021-08-02 NOTE — Telephone Encounter (Signed)
Call received from patient's wife Langley Gauss stating that patient is in extreme pain, is not able to walk and would like to know what is next for patient's plan. Pt.'s wife states frustration over not being able to fill pt.'s prescription for Percocet 10/325 sent in by the ER yesterday d/t most recent Oxycodone 5/325 refill picked up on 07/29/21.  Penni Homans NP and [name] notified.  Call placed to Dr. Lacy Duverney office per order of [name].  Per [name], Dr. Christella Noa states that he will contact pt.'s wife regarding possible surgery.  Lexine Baton has sent in a prescription for Oxy 5 mg for patient.  [name] aware and states that pt can take two Oxycodone 5 mg tablets along with one Percocet 5/325.  [name] Janalyn Harder RN states  that she will contact pt.'s wife and advise her of above. ?

## 2021-08-05 ENCOUNTER — Encounter: Payer: Medicare HMO | Admitting: Nurse Practitioner

## 2021-08-09 ENCOUNTER — Telehealth: Payer: Self-pay | Admitting: Gastroenterology

## 2021-08-09 ENCOUNTER — Telehealth: Payer: Self-pay

## 2021-08-09 NOTE — Telephone Encounter (Signed)
Pts son Koray Soter. Called (unable to find ROI in system), he is aware. He was requesting his dad be transferred from [facility] back to [facility] and wanted to know how to go about it, informed him he would have to have [facility] transfer him but once he is transferred Dr.Ennever would be made aware. He understood and will talk to the physician at [facility].

## 2021-08-09 NOTE — Telephone Encounter (Signed)
Good Morning Dr. Ardis Hughs,  Patients wife called stating she needed to cancel patients appointment with you for tomorrow at 3:00 due to patient being in the hospital.  Wife stated they would call back at a later time to reschedule.

## 2021-08-10 ENCOUNTER — Ambulatory Visit: Payer: Medicare HMO | Admitting: Gastroenterology

## 2021-08-11 ENCOUNTER — Encounter: Payer: Self-pay | Admitting: Hematology & Oncology

## 2021-08-12 ENCOUNTER — Other Ambulatory Visit: Payer: Self-pay | Admitting: Nurse Practitioner

## 2021-08-12 ENCOUNTER — Telehealth (HOSPITAL_BASED_OUTPATIENT_CLINIC_OR_DEPARTMENT_OTHER): Payer: Medicare HMO | Admitting: Nurse Practitioner

## 2021-08-12 DIAGNOSIS — Z515 Encounter for palliative care: Secondary | ICD-10-CM

## 2021-08-12 DIAGNOSIS — G893 Neoplasm related pain (acute) (chronic): Secondary | ICD-10-CM | POA: Diagnosis not present

## 2021-08-12 DIAGNOSIS — C7931 Secondary malignant neoplasm of brain: Secondary | ICD-10-CM

## 2021-08-12 DIAGNOSIS — C7951 Secondary malignant neoplasm of bone: Secondary | ICD-10-CM

## 2021-08-12 DIAGNOSIS — C3490 Malignant neoplasm of unspecified part of unspecified bronchus or lung: Secondary | ICD-10-CM | POA: Diagnosis not present

## 2021-08-12 DIAGNOSIS — C7952 Secondary malignant neoplasm of bone marrow: Secondary | ICD-10-CM

## 2021-08-12 MED ORDER — DEXAMETHASONE 4 MG PO TABS: 4.0000 mg | ORAL_TABLET | Freq: Every day | ORAL | 0 refills | Status: DC

## 2021-08-12 MED ORDER — KETOROLAC TROMETHAMINE 10 MG PO TABS: 10.0000 mg | ORAL_TABLET | Freq: Three times a day (TID) | ORAL | 0 refills | Status: DC | PRN

## 2021-08-12 MED ORDER — METHADONE HCL 10 MG PO TABS: 10.0000 mg | ORAL_TABLET | Freq: Three times a day (TID) | ORAL | 0 refills | Status: DC

## 2021-08-12 NOTE — Telephone Encounter (Signed)
Received message from Bill Garza. Bill Garza recently discharged from [facility] were he was admitted for pain management. He received ketamine and was on PCA pump during hospitalization as managed by their Pain Management team. Unfortunately Bill continues to have uncontrolled pain despite all efforts to gain some control. [name] is aware and I have spoken with him and provided updates.   Patient is currently taking oxy IR 15mg  every 4 hours for breakthrough pain and Xtampza 27mg  with no relief.   Education provided on plans for possible surgical interventions per [name]. I have also reached out to Dr. Wylene Men for possible intrathecal pump placement as additional option.   Given no improvement despite numerous medication adjustments and recent use of PCA will convert to methadone and discontinue Xtampza. Also will prescribe short course of Toradol and dexamethasone to target any inflammation.   Will start on methadone 10 mg every 8 hours (25% cross tolerance). Bill Garza aware to administer first dose once medication is picked up and also to discontinue use of Xtampza. Discontinue methadone and contact office if increase sedation is noticed.   All questions answered and support provided.   Number and complexity of problems addressed: 2 HIGH - 1 or more chronic illnesses with SEVERE exacerbation, progression, or side effects of treatment - advanced cancer, pain. Any controlled substances utilized were prescribed in the context of palliative care.   Visit consisted of counseling and education dealing with the complex and emotionally intense issues of symptom management and palliative care in the setting of serious and potentially life-threatening illness.Greater than 50%  of this time was spent counseling and coordinating care related to the above assessment and plan.  Alda Lea, AGPCNP-BC  Palliative Medicine Team/[facility]

## 2021-08-13 ENCOUNTER — Telehealth: Payer: Self-pay

## 2021-08-13 ENCOUNTER — Inpatient Hospital Stay: Payer: Medicare HMO

## 2021-08-13 ENCOUNTER — Other Ambulatory Visit: Payer: Self-pay

## 2021-08-13 ENCOUNTER — Inpatient Hospital Stay (HOSPITAL_BASED_OUTPATIENT_CLINIC_OR_DEPARTMENT_OTHER): Payer: Medicare HMO | Admitting: Hematology & Oncology

## 2021-08-13 ENCOUNTER — Encounter: Payer: Self-pay | Admitting: Hematology & Oncology

## 2021-08-13 ENCOUNTER — Other Ambulatory Visit (HOSPITAL_BASED_OUTPATIENT_CLINIC_OR_DEPARTMENT_OTHER): Payer: Self-pay

## 2021-08-13 VITALS — BP 120/78 | HR 97 | Temp 98.3°F | Resp 20

## 2021-08-13 DIAGNOSIS — C7931 Secondary malignant neoplasm of brain: Secondary | ICD-10-CM

## 2021-08-13 DIAGNOSIS — C3490 Malignant neoplasm of unspecified part of unspecified bronchus or lung: Secondary | ICD-10-CM | POA: Diagnosis not present

## 2021-08-13 DIAGNOSIS — C3492 Malignant neoplasm of unspecified part of left bronchus or lung: Secondary | ICD-10-CM | POA: Diagnosis not present

## 2021-08-13 DIAGNOSIS — Z515 Encounter for palliative care: Secondary | ICD-10-CM

## 2021-08-13 LAB — CMP (CANCER CENTER ONLY)
ALT: 49 U/L — ABNORMAL HIGH (ref 0–44)
AST: 46 U/L — ABNORMAL HIGH (ref 15–41)
Albumin: 3.5 g/dL (ref 3.5–5.0)
Alkaline Phosphatase: 638 U/L — ABNORMAL HIGH (ref 38–126)
Anion gap: 10 (ref 5–15)
BUN: 11 mg/dL (ref 6–20)
CO2: 25 mmol/L (ref 22–32)
Calcium: 8.7 mg/dL — ABNORMAL LOW (ref 8.9–10.3)
Chloride: 99 mmol/L (ref 98–111)
Creatinine: 0.64 mg/dL (ref 0.61–1.24)
GFR, Estimated: >60 mL/min (ref >60)
Glucose, Bld: 153 mg/dL — ABNORMAL HIGH (ref 70–99)
Potassium: 3.4 mmol/L — ABNORMAL LOW (ref 3.5–5.1)
Sodium: 134 mmol/L — ABNORMAL LOW (ref 135–145)
Total Bilirubin: 0.4 mg/dL (ref 0.3–1.2)
Total Protein: 6.3 g/dL — ABNORMAL LOW (ref 6.5–8.1)

## 2021-08-13 LAB — LACTATE DEHYDROGENASE: LDH: 295 U/L — ABNORMAL HIGH (ref 98–192)

## 2021-08-13 LAB — CBC WITH DIFFERENTIAL (CANCER CENTER ONLY)
Abs Immature Granulocytes: 0.06 10*3/uL (ref 0.00–0.07)
Basophils Absolute: 0 10*3/uL (ref 0.0–0.1)
Basophils Relative: 0 %
Eosinophils Absolute: 0 10*3/uL (ref 0.0–0.5)
Eosinophils Relative: 0 %
HCT: 35.8 % — ABNORMAL LOW (ref 39.0–52.0)
Hemoglobin: 12.2 g/dL — ABNORMAL LOW (ref 13.0–17.0)
Immature Granulocytes: 1 %
Lymphocytes Relative: 18 %
Lymphs Abs: 1.8 10*3/uL (ref 0.7–4.0)
MCH: 30.7 pg (ref 26.0–34.0)
MCHC: 34.1 g/dL (ref 30.0–36.0)
MCV: 90.2 fL (ref 80.0–100.0)
Monocytes Absolute: 0.9 10*3/uL (ref 0.1–1.0)
Monocytes Relative: 9 %
Neutro Abs: 7.3 10*3/uL (ref 1.7–7.7)
Neutrophils Relative %: 72 %
Platelet Count: 550 10*3/uL — ABNORMAL HIGH (ref 150–400)
RBC: 3.97 MIL/uL — ABNORMAL LOW (ref 4.22–5.81)
RDW: 14.7 % (ref 11.5–15.5)
WBC Count: 10.1 10*3/uL (ref 4.0–10.5)
nRBC: 0 % (ref 0.0–0.2)

## 2021-08-13 LAB — PREALBUMIN: Prealbumin: 14.2 mg/dL — ABNORMAL LOW (ref 18–38)

## 2021-08-13 MED ORDER — DEXAMETHASONE 4 MG PO TABS: 8.0000 mg | ORAL_TABLET | Freq: Two times a day (BID) | ORAL | 2 refills | Status: DC

## 2021-08-13 MED ORDER — DRONABINOL 2.5 MG PO CAPS
2.5000 mg | ORAL_CAPSULE | Freq: Three times a day (TID) | ORAL | 0 refills | Status: DC
  Filled 2021-08-13: qty 90, 30d supply, fill #0
  Filled 2021-08-13: qty 60, 20d supply, fill #0

## 2021-08-13 MED ORDER — DEXAMETHASONE 4 MG PO TABS
8.0000 mg | ORAL_TABLET | Freq: Two times a day (BID) | ORAL | 2 refills | Status: DC
  Filled 2021-08-13 (×2): qty 100, 25d supply, fill #0

## 2021-08-13 MED ORDER — DRONABINOL 2.5 MG PO CAPS: 2.5000 mg | ORAL_CAPSULE | Freq: Three times a day (TID) | ORAL | 0 refills | Status: DC

## 2021-08-13 NOTE — Progress Notes (Signed)
Hematology and Oncology Follow Up Visit  Bill Garza [id number] 1960-03-25 62 y.o. 08/13/2021   Principle Diagnosis:  Metastatic adenocarcinoma of the lung-CNS metastasis  Current Therapy:   Status post radiosurgery-  SBRT given on 04/13/2021 Zometa 4 mg IV every 3 months --next dose on 10/2021     Interim History:  Bill Garza is back for follow-up.  Unfortunately, he just is not getting better.  He is having a lot of back pain and pain in the right leg.  Normal back to his MRI, it looks like he has disc issues.  He has seen Dr. Christella Noa of Neurosurgery.  Dr. Christella Noa has ordered a epidural injection to be done.  Spoke with Dr. Christella Noa.  I told him that if he needed to do surgery on Bill Garza, I would not have a problem with this.  I realize that he has metastatic lung cancer but this is clearly all to go metastatic disease.  His quality of life clearly is dictated by the back pain and the disc issues.  I am still awaiting for the molecular markers to be done on the brain tumor that was resected at [facility].  I am not sure when this will come back.  I think that he is scheduled to see Gastroenterology.  I just hate that he is hurting so much.  He is not tolerant of gabapentin, Lyrica or Cymbalta.  He has had no fever.  He has had no cough.  He is not sleeping much at all according to his wife.  He has lost a little bit of weight because he is not eating much because of all the pain that he is in.  Currently, I would have said that his performance status is probably ECOG 2.  Medications:  Current Outpatient Medications:  .  albuterol (PROVENTIL) (2.5 MG/3ML) 0.083% nebulizer solution, Take 2.5 mg by nebulization every 6 (six) hours as needed for shortness of breath or wheezing., Disp: , Rfl:  .  albuterol (VENTOLIN HFA) 108 (90 Base) MCG/ACT inhaler, Inhale 2 puffs into the lungs every 4 (four) hours as needed for wheezing or shortness of breath., Disp: 3 each, Rfl: 1 .  aspirin EC 81 MG  tablet, Take 81 mg by mouth daily., Disp: , Rfl:  .  Bempedoic Acid (NEXLETOL) 180 MG TABS, Take 180 mg by mouth at bedtime., Disp: , Rfl:  .  budesonide-formoterol (SYMBICORT) 160-4.5 MCG/ACT inhaler, Inhale 2 puffs into the lungs 2 (two) times daily., Disp: 3 each, Rfl: 1 .  dexamethasone (DECADRON) 4 MG tablet, Take 1 tablet (4 mg total) by mouth daily., Disp: 7 tablet, Rfl: 0 .  escitalopram (LEXAPRO) 5 MG tablet, Take 5 mg by mouth daily., Disp: , Rfl:  .  ketorolac (TORADOL) 10 MG tablet, Take 1 tablet (10 mg total) by mouth every 8 (eight) hours as needed., Disp: 60 tablet, Rfl: 0 .  metFORMIN (GLUCOPHAGE) 500 MG tablet, Take 500 mg by mouth daily., Disp: , Rfl:  .  methadone (DOLOPHINE) 10 MG tablet, Take 1 tablet (10 mg total) by mouth every 8 (eight) hours., Disp: 90 tablet, Rfl: 0 .  montelukast (SINGULAIR) 10 MG tablet, Take 10 mg by mouth at bedtime., Disp: , Rfl:  .  Multiple Vitamin (MULTIVITAMIN) capsule, Take 1 capsule by mouth daily., Disp: , Rfl:  .  pravastatin (PRAVACHOL) 40 MG tablet, Take 40 mg by mouth at bedtime., Disp: , Rfl:  .  promethazine (PHENERGAN) 12.5 MG tablet, Take by mouth., Disp: , Rfl:  .  senna-docusate (SENOKOT-S) 8.6-50 MG tablet, Take 2 tablets by mouth 2 (two) times daily., Disp: 90 tablet, Rfl: 2 .  ACCU-CHEK GUIDE test strip, , Disp: , Rfl:  .  Blood Glucose Monitoring Suppl (ACCU-CHEK GUIDE) w/Device KIT, USE AS DIRECTED TO CHECK BLOOD SUGAR (Patient not taking: Reported on 07/26/2021), Disp: , Rfl:  .  Blood Glucose Monitoring Suppl (GLUCOCOM BLOOD GLUCOSE MONITOR) DEVI, Use to check blood sugar daily. (Patient not taking: Reported on 07/26/2021), Disp: , Rfl:  .  fluocinolone (VANOS) 0.01 % cream, Apply 1 application. topically daily as needed (rash). (Patient not taking: Reported on 08/13/2021), Disp: , Rfl:  .  gabapentin (NEURONTIN) 300 MG capsule, Take 1 capsule (300 mg total) by mouth 3 (three) times daily. (Patient not taking: Reported on 08/13/2021),  Disp: 300 capsule, Rfl: 0 .  glucose blood (PRECISION QID TEST) test strip, Use to check blood sugar daily. (Patient not taking: Reported on 07/26/2021), Disp: , Rfl:  .  hydrALAZINE (APRESOLINE) 25 MG tablet, Take by mouth. (Patient not taking: Reported on 08/13/2021), Disp: , Rfl:  .  lactulose (CHRONULAC) 10 GM/15ML solution, Take 30 mLs (20 g total) by mouth 3 (three) times daily. (Patient not taking: Reported on 08/13/2021), Disp: 1000 mL, Rfl: 4 .  Lancets MISC, Use to check blood sugar daily. (Patient not taking: Reported on 07/26/2021), Disp: , Rfl:  .  LORazepam (ATIVAN) 1 MG tablet, Take 1 tablet 60 min prior to PET or MRI, and one more tablet 30 minutes prior to imaging if needed for anxiety. (Take 1- 2 tablets PRN anxiety prior to imaging). (Patient not taking: Reported on 07/26/2021), Disp: 8 tablet, Rfl: 0 .  methocarbamol (ROBAXIN) 500 MG tablet, Take 1 tablet (500 mg total) by mouth every 8 (eight) hours as needed for muscle spasms. (Patient not taking: Reported on 08/13/2021), Disp: 60 tablet, Rfl: 0 .  metoCLOPramide (REGLAN) 10 MG tablet, Take 2 tablets (20 mg total) by mouth 4 (four) times daily -  before meals and at bedtime. (Patient not taking: Reported on 08/13/2021), Disp: 120 tablet, Rfl: 4 .  naloxone (NARCAN) nasal spray 4 mg/0.1 mL, Place into the nose. (Patient not taking: Reported on 08/13/2021), Disp: , Rfl:  .  polyethylene glycol (MIRALAX / GLYCOLAX) 17 g packet, Take 17 g by mouth daily. (Patient not taking: Reported on 08/13/2021), Disp: , Rfl:   Allergies:  Allergies  Allergen Reactions  . Gabapentin Swelling    Leg swelling   . Levetiracetam Other (See Comments)    Personality changes  . Pregabalin Swelling    Leg swelling   . Bupropion Nausea And Vomiting  . Duloxetine Other (See Comments)    Excessive sleeping  . Ezetimibe Other (See Comments)    Unknown reaction  . Other Other (See Comments)    Antibiotics - per wife at bedside, patient will develop C-diff if  given "any antibiotic"  . Prochlorperazine Nausea And Vomiting  . Lamotrigine Itching and Rash    Past Medical History, Surgical history, Social history, and Family History were reviewed and updated.  Review of Systems: Review of Systems  Constitutional: Negative.   HENT:  Negative.    Eyes: Negative.   Respiratory: Negative.    Cardiovascular: Negative.   Gastrointestinal: Negative.   Endocrine: Negative.   Genitourinary: Negative.    Musculoskeletal: Negative.   Skin: Negative.   Neurological: Negative.   Hematological: Negative.   Psychiatric/Behavioral:  The patient is nervous/anxious.    Physical Exam:  oral temperature is  98.3 F (36.8 C). His blood pressure is 120/78 and his pulse is 97. His respiration is 20 and oxygen saturation is 98%.   Wt Readings from Last 3 Encounters:  08/01/21 159 lb (72.1 kg)  07/30/21 169 lb (76.7 kg)  07/22/21 169 lb (76.7 kg)    Physical Exam Vitals reviewed.  HENT:     Head: Normocephalic and atraumatic.  Eyes:     Pupils: Pupils are equal, round, and reactive to light.  Cardiovascular:     Rate and Rhythm: Normal rate and regular rhythm.     Heart sounds: Normal heart sounds.  Pulmonary:     Effort: Pulmonary effort is normal.     Breath sounds: Normal breath sounds.  Abdominal:     General: Bowel sounds are normal.     Palpations: Abdomen is soft.  Musculoskeletal:        General: No tenderness or deformity. Normal range of motion.     Cervical back: Normal range of motion.  Lymphadenopathy:     Cervical: No cervical adenopathy.  Skin:    General: Skin is warm and dry.     Findings: No erythema or rash.     Comments: On his skin exam, there are very scattered small erythematous maculopapular lesions.  These are maybe a millimeter in size.  They are pruritic.  There might be a central eschar.  There is no pustules.  Neurological:     Mental Status: He is alert and oriented to person, place, and time.  Psychiatric:         Behavior: Behavior normal.        Thought Content: Thought content normal.        Judgment: Judgment normal.     Lab Results  Component Value Date   WBC 10.1 08/13/2021   HGB 12.2 (L) 08/13/2021   HCT 35.8 (L) 08/13/2021   MCV 90.2 08/13/2021   PLT 550 (H) 08/13/2021     Chemistry      Component Value Date/Time   NA 135 08/01/2021 0439   K 3.4 (L) 08/01/2021 0439   CL 105 08/01/2021 0439   CO2 23 08/01/2021 0439   BUN 10 08/01/2021 0439   CREATININE 0.60 (L) 08/01/2021 0439   CREATININE 0.80 07/26/2021 1331      Component Value Date/Time   CALCIUM 8.4 (L) 08/01/2021 0439   ALKPHOS 223 (H) 07/30/2021 0247   AST 30 07/30/2021 0247   AST 28 07/26/2021 1331   ALT 31 07/30/2021 0247   ALT 29 07/26/2021 1331   BILITOT 0.4 07/30/2021 0247   BILITOT 0.2 (L) 07/26/2021 1331      Impression and Plan: Bill Garza is a very nice 61 year old white male.  He has a history of metastatic adenocarcinoma of the lung.  He initially presented with localized-stage IIB -adenocarcinoma of the left lung.  He was placed on clinical trial.  He only had 1 cycle of treatment.  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  He did undergo resection of a lesion back in August 2022.  He then underwent radiosurgery.  He has had another round of radiosurgery.    He recently has had resection of the CNS met.  Again, we have to await the Foundation 1.  We really need to try to get this back taken care of.  Again this is controlling his quality of life way more than the lung cancer is.  Hopefully, the epidural injection will help.  I will  give him Decadron at 30 mg IV today.  He will get some IV Dilaudid.  Hopefully this will make him feel a little bit better.  We really cannot even think about any systemic therapy for him right now for his lung cancer because of the issues with his back.  I just want to get his quality of life back to where it needs to be.  We will have him come back  to see [place] in about 3 to 4 weeks.  Hopefully, he will be feeling better.  Again, if he needs surgery for his back, I have no problems with him having this.    [name], MD 5/26/20231:09 PM

## 2021-08-13 NOTE — Telephone Encounter (Signed)
Spoke with patient's wife Bill Garza answering previous questions. This LPN stated per Bill Brave NP, he can take the Toradol and methadone every 6 hrs but the methadone is not to be exceeded over 3 doses a day. Also per her pharmacy in [place], the Toradol is only given 20 tablets every 23 days due to their insurance. Bill Garza verbalized understanding and mentions they are seeing Bill Garza today and that the pt has fluid in his legs/feet and she will mention that to Bill Garza. No further questions.

## 2021-08-13 NOTE — Telephone Encounter (Signed)
Pt wife called. [place] [facility] does not have marinol in stock and requesting a different pharmacy. Called [facility], they have it and will send to them

## 2021-08-13 NOTE — Telephone Encounter (Signed)
Spoke with patients wife Langley Gauss, she states the Toradol and Methadone do seem to be helping him more than anything else they have tried so far. The only problem is it wore off two hours before he could take another dose. Also Langley Gauss mentioned they only had 20 tablets of the Toradol and would need more eventually since he is taking it every 8hrs. This LPN stated Chesley Noon will be notified regarding this. [name] verbalized understanding.

## 2021-08-17 ENCOUNTER — Encounter: Payer: Self-pay | Admitting: Hematology & Oncology

## 2021-08-17 ENCOUNTER — Other Ambulatory Visit: Payer: Self-pay | Admitting: *Deleted

## 2021-08-19 ENCOUNTER — Other Ambulatory Visit: Payer: Self-pay | Admitting: *Deleted

## 2021-08-19 ENCOUNTER — Telehealth: Payer: Self-pay | Admitting: Hematology & Oncology

## 2021-08-19 ENCOUNTER — Telehealth: Payer: Self-pay

## 2021-08-19 ENCOUNTER — Other Ambulatory Visit: Payer: Self-pay | Admitting: Neurosurgery

## 2021-08-19 ENCOUNTER — Encounter: Payer: Self-pay | Admitting: Nutrition

## 2021-08-19 ENCOUNTER — Telehealth: Payer: Self-pay | Admitting: *Deleted

## 2021-08-19 DIAGNOSIS — M5126 Other intervertebral disc displacement, lumbar region: Secondary | ICD-10-CM

## 2021-08-19 MED ORDER — OXYCODONE-ACETAMINOPHEN 10-325 MG PO TABS: 1.0000 | ORAL_TABLET | ORAL | 0 refills | Status: DC | PRN

## 2021-08-19 NOTE — Telephone Encounter (Signed)
This RN called Lorriane Shire, RN, with Dr.Ennever's office, per MD pt will be seen by Dr.Ennever moving forward. Alda Lea, NP notified, our office is available for both pt and family if needed.

## 2021-08-19 NOTE — Progress Notes (Signed)
Patient was identified to be at risk for malnutrition on the MST secondary to wt loss and poor appetite. RD appointment offered but was declined.

## 2021-08-19 NOTE — Telephone Encounter (Signed)
Call received from patient's wife Langley Gauss stating that they have still not received a call from Dr. Lacy Duverney office regarding next steps for patient.    Call placed to Dr. Lacy Duverney office by this RN and request made for Dr. Lacy Duverney office to please contact pt.'s wife regarding next steps for patient.  Call received from patient's wife stating that she received a call from Dr. Lacy Duverney office and they have sent a stat request to [facility] this morning for a Myelogram.  Pt.'s wife also states that pt would like to know if he can go back to taking Percocet d/t the Toradol is not helping his pain at this time. [name] notified.  Order received for pt to hold Toradol and prescription for Percocet 10/325 sent in to pt.'s pharmacy per [name]. Pt.'s wife notified of above and is appreciative of all assistance.

## 2021-08-19 NOTE — Telephone Encounter (Signed)
Pt called, spoke with wife Langley Gauss, notified her that pt toradol was approved and available for pick up, pt voiced understanding, no further needs.

## 2021-08-19 NOTE — Telephone Encounter (Signed)
Called pt to sch per 5/31 inbasket, pt wife said they were not interested at this time, rd notified

## 2021-08-20 ENCOUNTER — Encounter: Payer: Self-pay | Admitting: Hematology & Oncology

## 2021-08-20 ENCOUNTER — Ambulatory Visit
Admission: RE | Admit: 2021-08-20 | Discharge: 2021-08-20 | Disposition: A | Discharge status: Home / Self Care | Payer: Medicare HMO | Source: Ambulatory Visit | Attending: Neurosurgery | Admitting: Neurosurgery

## 2021-08-20 ENCOUNTER — Telehealth: Payer: Self-pay | Admitting: *Deleted

## 2021-08-20 ENCOUNTER — Other Ambulatory Visit: Payer: Self-pay | Admitting: *Deleted

## 2021-08-20 DIAGNOSIS — M5126 Other intervertebral disc displacement, lumbar region: Secondary | ICD-10-CM

## 2021-08-20 IMAGING — CT CT L SPINE W/ CM
1 of 7 series · 6 of 14 positions shown, 8 images · non-contrast
Comparison: MRI of the lumbar spine without contrast [DATE]

CLINICAL DATA: Disc displacement, lumbar. Right lower extremity
radiculopathy extending to lateral anterior thigh without relief
from L4-5 epidural steroid injection.
TECHNIQUE: Contiguous axial images were obtained through the Lumbar spine after
the intrathecal infusion of infusion. Coronal and sagittal
reconstructions were obtained of the axial image sets.

[Series 3: l spine soft · axial · 0.36mm/px · z∈[-294,-132]mm · 6 of 115 slices shown, 8 images]
[im 17/115  soft-tissue]
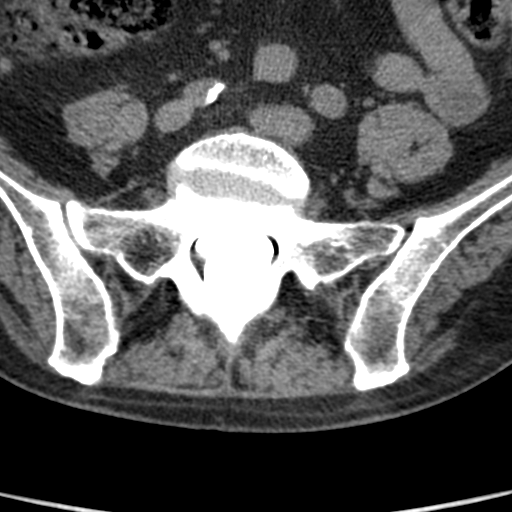
[im 17/115  bone]
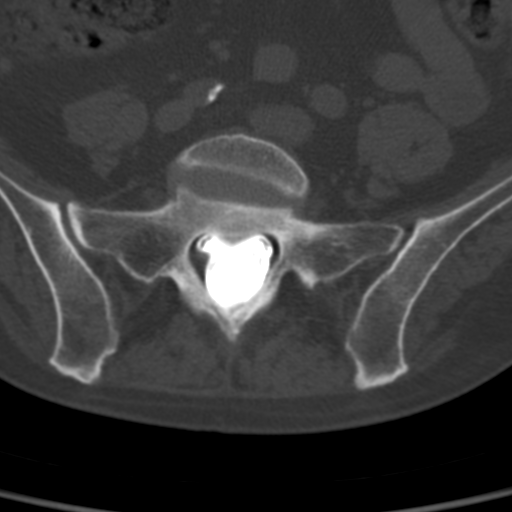
[im 33/115  bone]
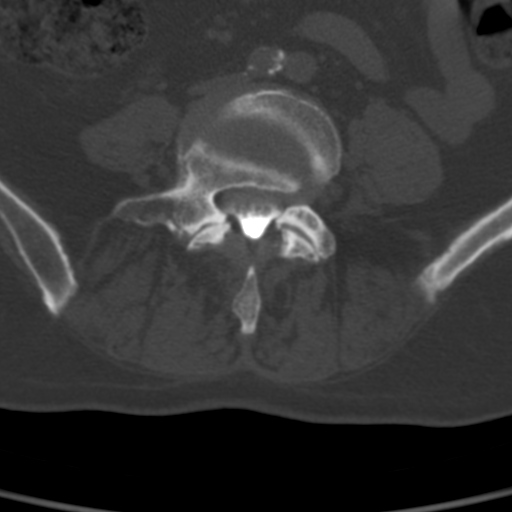
[im 49/115  bone]
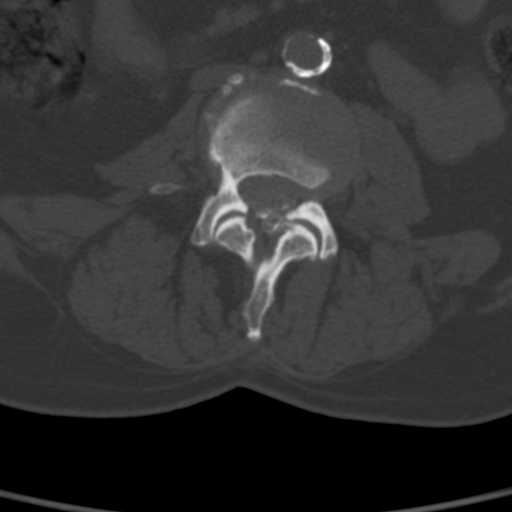
[im 66/115  bone]
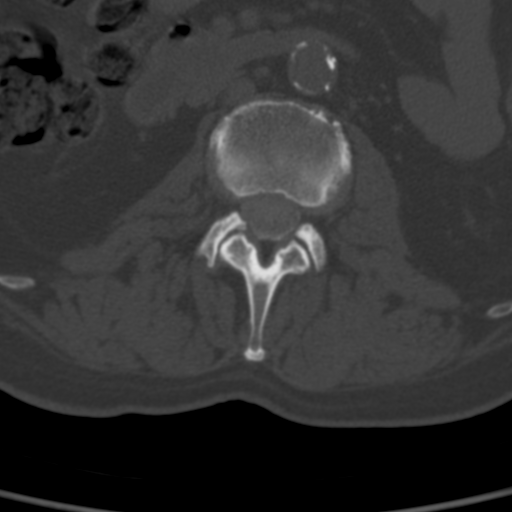
[im 82/115  soft-tissue]
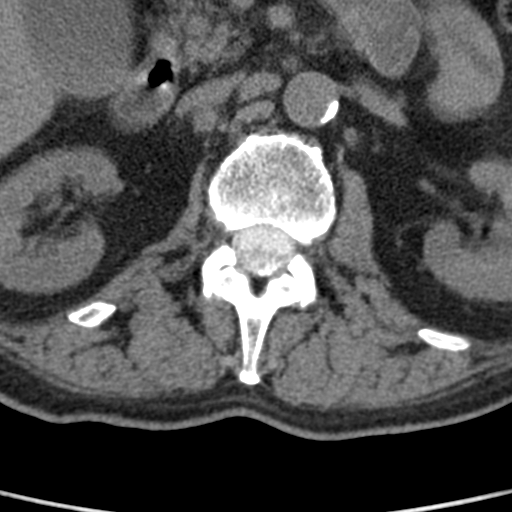
[im 82/115  bone]
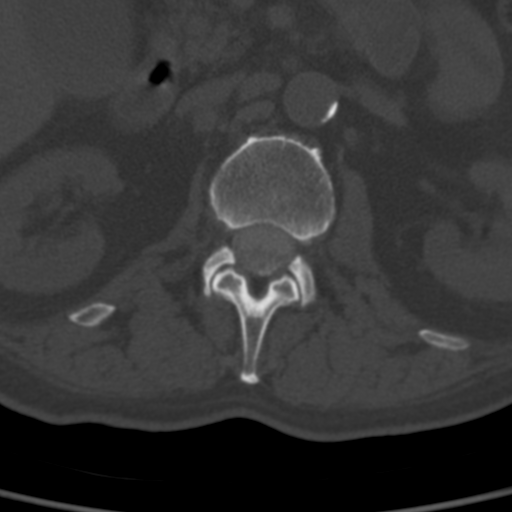
[im 98/115  bone]
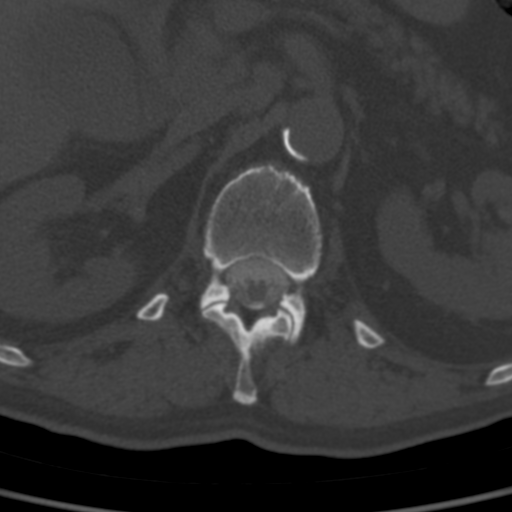

[6 of 14 positions shown; findings below may reference images not displayed]

EXAM:
LUMBAR MYELOGRAM

FLUOROSCOPY:
Radiation Exposure Index (as provided by the fluoroscopic device):
Dose area product 716.04 uGy*m2

PROCEDURE:
After thorough discussion of risks and benefits of the procedure
including bleeding, infection, injury to nerves, blood vessels,
adjacent structures as well as headache and CSF leak, written and
oral informed consent was obtained. Consent was obtained by Dr.
MEE. Time out form was completed.

Patient was positioned prone on the fluoroscopy table. Local
anesthesia was provided with 1% lidocaine without epinephrine after
prepped and draped in the usual sterile fashion. Puncture was
performed at L2-3 using a 3 1/2 inch 22-gauge spinal needle via left
paramidline approach. Using a single pass through the dura, the
needle was placed within the thecal sac, with return of clear CSF.
15 mL of Isovue [1Y] was injected into the thecal sac, with normal
opacification of the nerve roots and cauda equina consistent with
free flow within the subarachnoid space.

I personally performed the lumbar puncture and administered the
intrathecal contrast. I also personally supervised acquisition of
the myelogram images.
FINDINGS: LUMBAR MYELOGRAM FINDINGS:

Five non rib-bearing lumbar type vertebral bodies are present.
Subtle changes are noted at the known L4 lesion. Vertebral body
heights are maintained. Slight retrolisthesis is present at L3-4.
This is slightly worse with extension. No other significant
listhesis is present.

Atherosclerotic calcifications are present in the aorta without
aneurysm.

Broad-based disc protrusion is present at L3-4. This results in
moderate central canal stenosis subarticular narrowing bilaterally
with right subarticular narrowing.

Right subarticular narrowing is also present at L4-5 secondary to a
broad-based disc protrusion. The nerve roots otherwise fill normally
on both sides.

CT LUMBAR MYELOGRAM FINDINGS:

Lumbar spine is imaged from T12 through S2. Vertebral body heights
are maintained. Lucent lesion anteriorly at L4 measures 9 mm cephalo
caudad, compatible with the suspected metastasis. Multiple lucent
lesions are again noted within the left iliac bone. There is
destruction of the medial cortex.

Atherosclerotic calcifications are present within the aorta and
branch vessels. Visualized abdomen is otherwise unremarkable. No
significant retroperitoneal adenopathy is present.

Mild leftward curvature of the lumbar spine is centered at L3-4.

T12-L1: Negative.

L1-2: Mild facet hypertrophy is present bilaterally. No significant
stenosis is present.

L2-3: Mild facet hypertrophy is present. No significant disc
protrusion or stenosis is present.

L3-4: A broad-based disc protrusion is asymmetric to the right. A
left-sided synovial cyst creates some mass effect on the posterior
canal. Right greater than left subarticular narrowing is present.
Moderate foraminal narrowing is worse right than left.

L4-5: A broad-based disc protrusion is again seen. Left laminectomy
noted. The canal is decompressed posteriorly on the left. Facet
spurring and disc protrusions result in moderate foraminal stenosis,
right greater than left. Mild subarticular narrowing is worse right
than left.

L5-S1: A shallow central disc protrusion is present without
significant stenosis.
IMPRESSION: 1. Left laminectomy at L4-5 with decompression of the canal
posteriorly on the left.
2. Moderate foraminal stenosis bilaterally at L4-5 is worse on the
right.
3. Mild subarticular narrowing at L4-5 is worse right than left.
4. Moderate central canal stenosis at L3-4 with right greater than
left subarticular narrowing.
5. Mild retrolisthesis at L3-4 is slightly exacerbated with
extension.
6. Moderate foraminal narrowing bilaterally at L3-4 is worse on the
right.
7. Suspected metastases at L4 and in the left iliac bone.
8. Mild facet hypertrophy at L1-2 and L2-3 without significant
stenosis.
9. Aortic Atherosclerosis ([1Y]-[1Y]).

## 2021-08-20 IMAGING — XA DG MYELOGRAPHY LUMBAR INJ LUMBOSACRAL
12 of 18 series · 12 of 19 positions shown · non-contrast
Comparison: MRI of the lumbar spine without contrast [DATE]

CLINICAL DATA: Disc displacement, lumbar. Right lower extremity
radiculopathy extending to lateral anterior thigh without relief
from L4-5 epidural steroid injection.
TECHNIQUE: Contiguous axial images were obtained through the Lumbar spine after
the intrathecal infusion of infusion. Coronal and sagittal
reconstructions were obtained of the axial image sets.

[Series 1: ortho standard · 1 of 1 slices shown (1 of 10)]
[im 1/1]
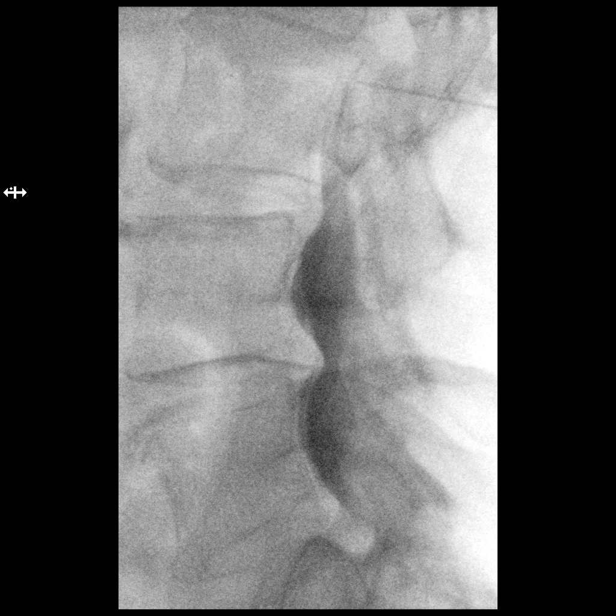

[Series 2: w lumbar spine flexion · 0.15mm/px · 1 of 1 slices shown]
[im 1/1]
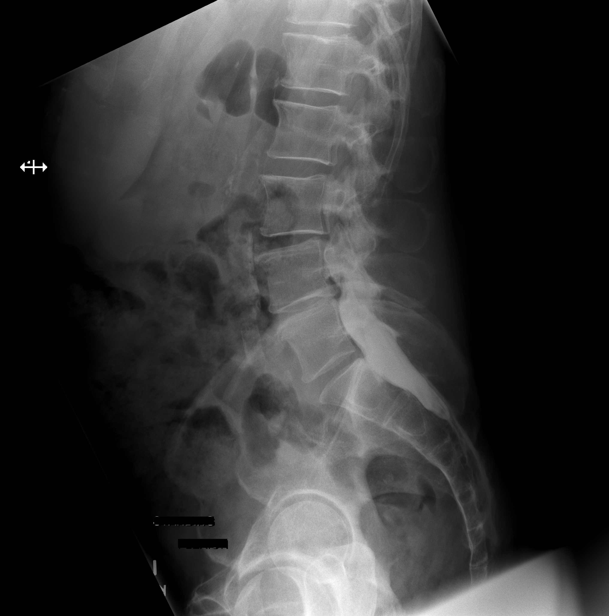

[Series 2: ortho standard · 1 of 2 slices shown (2 of 10)]
[im 1/2]
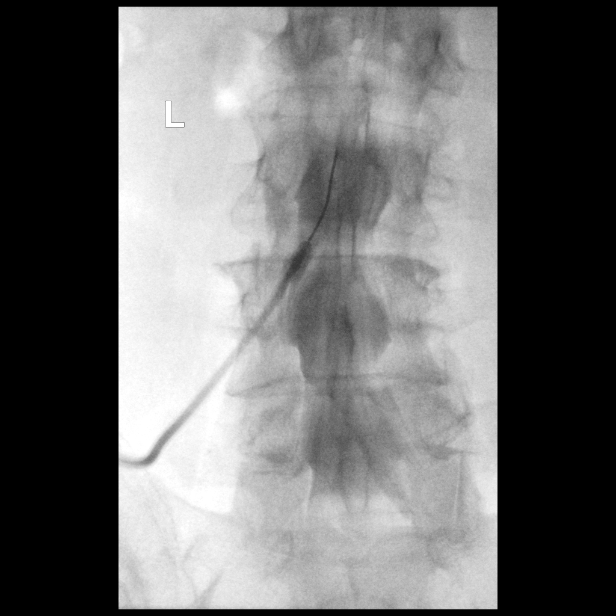

[Series 3: w lumbar spine extension · 0.15mm/px · 1 of 1 slices shown]
[im 1/1]
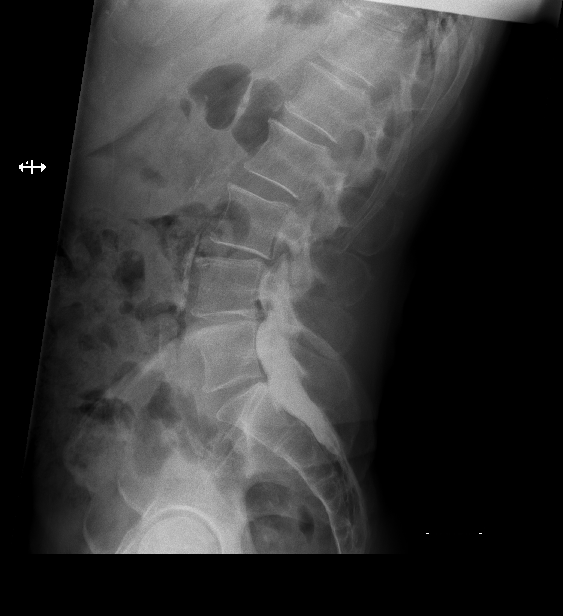

[Series 5: ortho standard · 1 of 1 slices shown (3 of 10)]
[im 1/1]
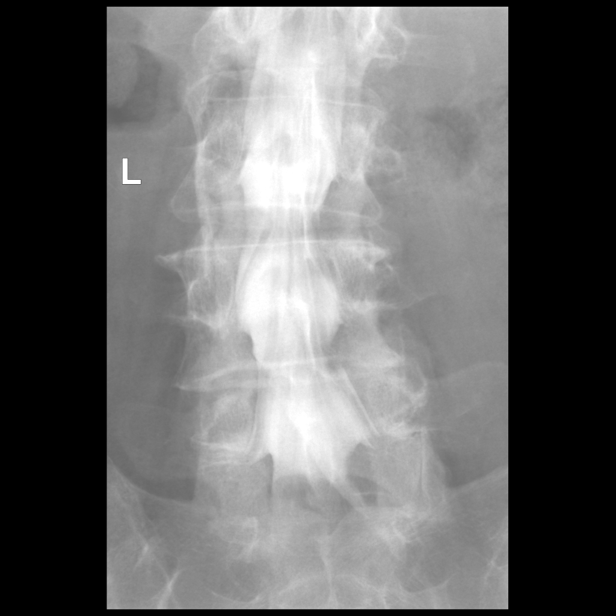

[Series 6: ortho standard · 1 of 1 slices shown (4 of 10)]
[im 1/1]
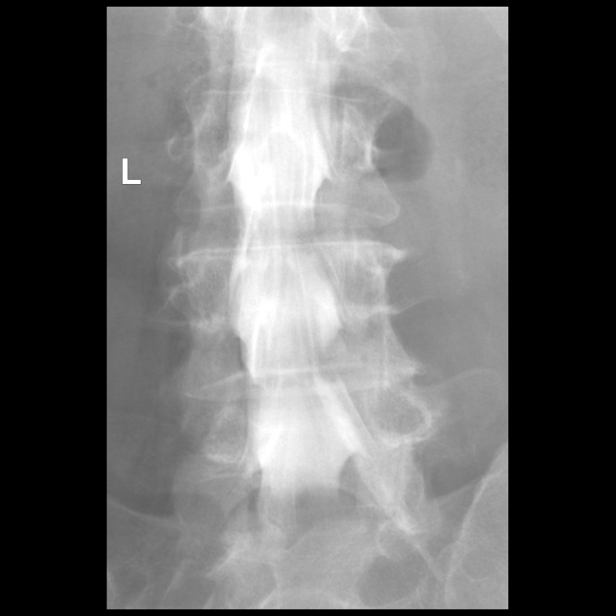

[Series 8: ortho standard · 1 of 1 slices shown (5 of 10)]
[im 1/1]
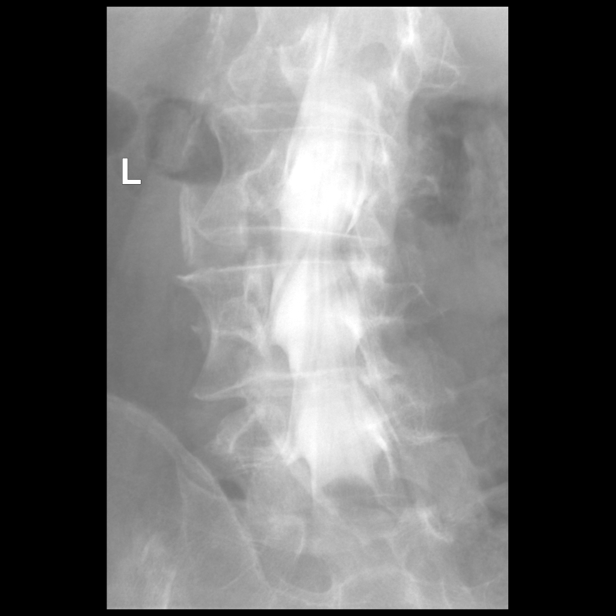

[Series 9: ortho standard · 1 of 1 slices shown (6 of 10)]
[im 1/1]
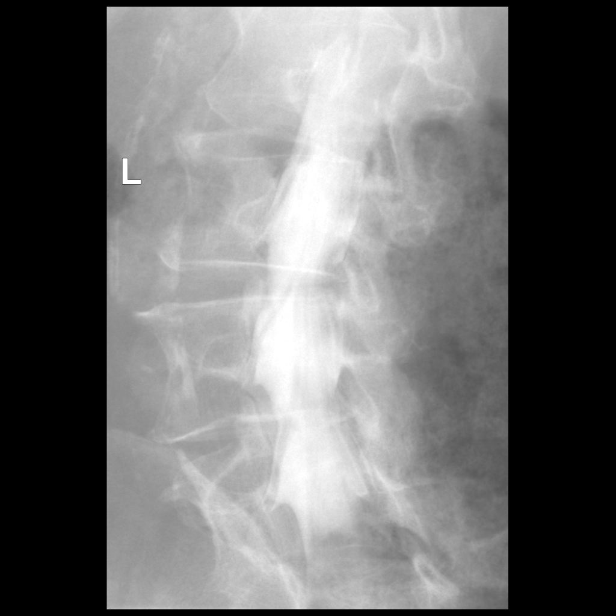

[Series 11: ortho standard · 1 of 1 slices shown (7 of 10)]
[im 1/1]
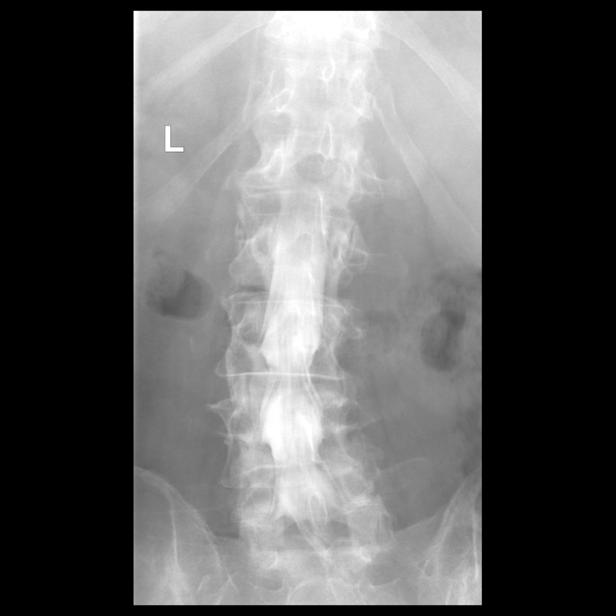

[Series 13: ortho standard · 1 of 1 slices shown (8 of 10)]
[im 1/1]
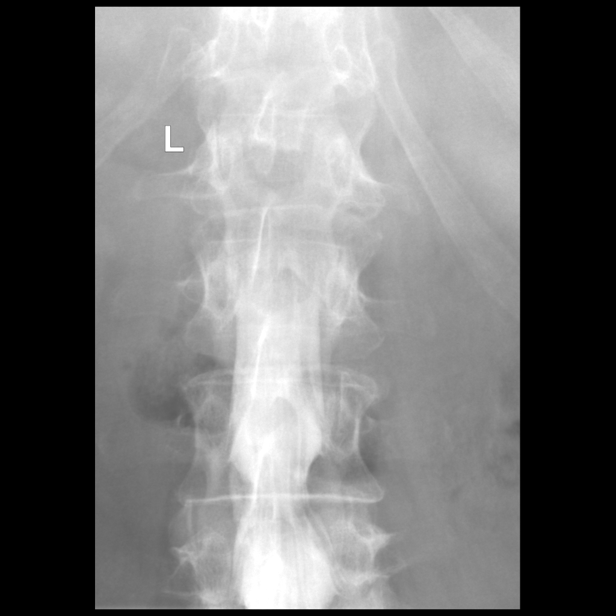

[Series 14: ortho standard · 1 of 1 slices shown (9 of 10)]
[im 1/1]
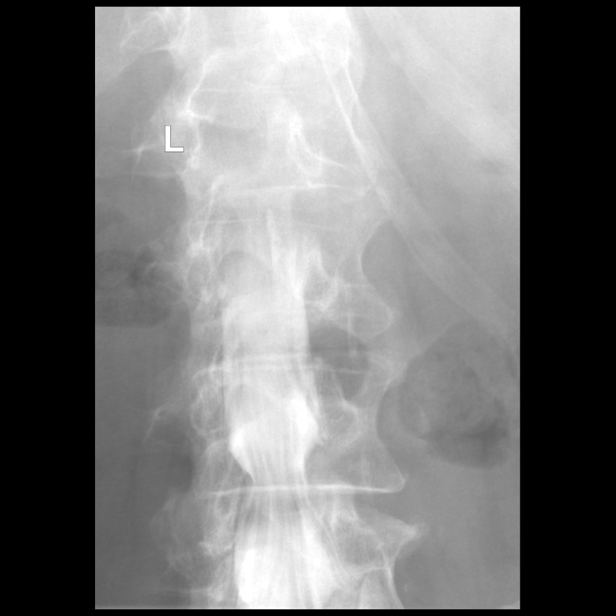

[Series 16: ortho standard · 1 of 1 slices shown (10 of 10)]
[im 1/1]
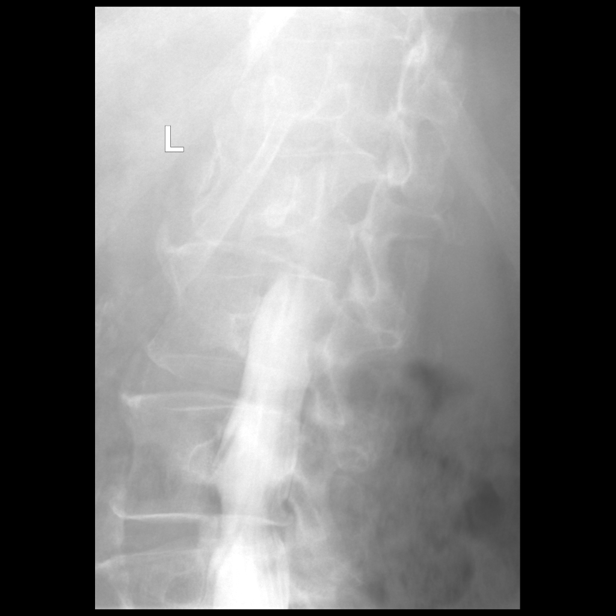

[12 of 19 positions shown; findings below may reference images not displayed]

EXAM:
LUMBAR MYELOGRAM

FLUOROSCOPY:
Radiation Exposure Index (as provided by the fluoroscopic device):
Dose area product 716.04 uGy*m2

PROCEDURE:
After thorough discussion of risks and benefits of the procedure
including bleeding, infection, injury to nerves, blood vessels,
adjacent structures as well as headache and CSF leak, written and
oral informed consent was obtained. Consent was obtained by Dr.
MEE. Time out form was completed.

Patient was positioned prone on the fluoroscopy table. Local
anesthesia was provided with 1% lidocaine without epinephrine after
prepped and draped in the usual sterile fashion. Puncture was
performed at L2-3 using a 3 1/2 inch 22-gauge spinal needle via left
paramidline approach. Using a single pass through the dura, the
needle was placed within the thecal sac, with return of clear CSF.
15 mL of Isovue [1Y] was injected into the thecal sac, with normal
opacification of the nerve roots and cauda equina consistent with
free flow within the subarachnoid space.

I personally performed the lumbar puncture and administered the
intrathecal contrast. I also personally supervised acquisition of
the myelogram images.
FINDINGS: LUMBAR MYELOGRAM FINDINGS:

Five non rib-bearing lumbar type vertebral bodies are present.
Subtle changes are noted at the known L4 lesion. Vertebral body
heights are maintained. Slight retrolisthesis is present at L3-4.
This is slightly worse with extension. No other significant
listhesis is present.

Atherosclerotic calcifications are present in the aorta without
aneurysm.

Broad-based disc protrusion is present at L3-4. This results in
moderate central canal stenosis subarticular narrowing bilaterally
with right subarticular narrowing.

Right subarticular narrowing is also present at L4-5 secondary to a
broad-based disc protrusion. The nerve roots otherwise fill normally
on both sides.

CT LUMBAR MYELOGRAM FINDINGS:

Lumbar spine is imaged from T12 through S2. Vertebral body heights
are maintained. Lucent lesion anteriorly at L4 measures 9 mm cephalo
caudad, compatible with the suspected metastasis. Multiple lucent
lesions are again noted within the left iliac bone. There is
destruction of the medial cortex.

Atherosclerotic calcifications are present within the aorta and
branch vessels. Visualized abdomen is otherwise unremarkable. No
significant retroperitoneal adenopathy is present.

Mild leftward curvature of the lumbar spine is centered at L3-4.

T12-L1: Negative.

L1-2: Mild facet hypertrophy is present bilaterally. No significant
stenosis is present.

L2-3: Mild facet hypertrophy is present. No significant disc
protrusion or stenosis is present.

L3-4: A broad-based disc protrusion is asymmetric to the right. A
left-sided synovial cyst creates some mass effect on the posterior
canal. Right greater than left subarticular narrowing is present.
Moderate foraminal narrowing is worse right than left.

L4-5: A broad-based disc protrusion is again seen. Left laminectomy
noted. The canal is decompressed posteriorly on the left. Facet
spurring and disc protrusions result in moderate foraminal stenosis,
right greater than left. Mild subarticular narrowing is worse right
than left.

L5-S1: A shallow central disc protrusion is present without
significant stenosis.
IMPRESSION: 1. Left laminectomy at L4-5 with decompression of the canal
posteriorly on the left.
2. Moderate foraminal stenosis bilaterally at L4-5 is worse on the
right.
3. Mild subarticular narrowing at L4-5 is worse right than left.
4. Moderate central canal stenosis at L3-4 with right greater than
left subarticular narrowing.
5. Mild retrolisthesis at L3-4 is slightly exacerbated with
extension.
6. Moderate foraminal narrowing bilaterally at L3-4 is worse on the
right.
7. Suspected metastases at L4 and in the left iliac bone.
8. Mild facet hypertrophy at L1-2 and L2-3 without significant
stenosis.
9. Aortic Atherosclerosis ([1Y]-[1Y]).

## 2021-08-20 MED ORDER — LORAZEPAM 1 MG PO TABS: 1.0000 mg | ORAL_TABLET | Freq: Four times a day (QID) | ORAL | 0 refills | Status: DC | PRN

## 2021-08-20 MED ORDER — PROMETHAZINE HCL 25 MG RE SUPP: 25.0000 mg | Freq: Four times a day (QID) | RECTAL | 0 refills | Status: DC | PRN

## 2021-08-20 MED ORDER — MEPERIDINE HCL 50 MG/ML IJ SOLN
50.0000 mg | Freq: Once | INTRAMUSCULAR | Status: AC | PRN
  Administered 2021-08-20: 50 mg via INTRAMUSCULAR

## 2021-08-20 MED ORDER — PROCHLORPERAZINE 25 MG RE SUPP: 25.0000 mg | Freq: Two times a day (BID) | RECTAL | 0 refills | Status: DC | PRN

## 2021-08-20 MED ORDER — IOPAMIDOL (ISOVUE-M 200) INJECTION 41%
18.0000 mL | Freq: Once | INTRAMUSCULAR | Status: AC
  Administered 2021-08-20: 18 mL via INTRATHECAL

## 2021-08-20 MED ORDER — ONDANSETRON HCL 4 MG/2ML IJ SOLN
4.0000 mg | Freq: Once | INTRAMUSCULAR | Status: AC | PRN
  Administered 2021-08-20: 4 mg via INTRAMUSCULAR

## 2021-08-20 MED ORDER — DIAZEPAM 5 MG PO TABS: 10.0000 mg | ORAL_TABLET | Freq: Once | ORAL | Status: DC

## 2021-08-20 NOTE — Telephone Encounter (Signed)
Call received from patient's wife stating that pt is vomiting with increased pain after myelogram today and would like to know if pt can take two Oxy 10/325 tablets at a time.  [name] notified.  Pt.'s wife notified per order of [name] that it is ok for pt to take two Oxy 10/325 tablets every four hours as needed and that a prescription for Phenergan supp and oral Ativan will be sent in per [name]. Pt.'s wife is appreciative of assistance and has no further questions at this time.

## 2021-08-20 NOTE — Discharge Instructions (Signed)

## 2021-08-20 NOTE — Discharge Instr - Other Orders (Signed)
1020: pt reports pain 10/10 in back prior to starting myelogram procedure. Per Dr. Jobe Igo, PRN medication given prior to procedure. See MAR.

## 2021-08-23 ENCOUNTER — Other Ambulatory Visit: Payer: Self-pay | Admitting: Family

## 2021-08-23 ENCOUNTER — Other Ambulatory Visit: Payer: Self-pay | Admitting: *Deleted

## 2021-08-23 ENCOUNTER — Inpatient Hospital Stay: Payer: Medicare HMO | Attending: Hematology & Oncology

## 2021-08-23 ENCOUNTER — Inpatient Hospital Stay: Payer: Medicare HMO

## 2021-08-23 ENCOUNTER — Encounter: Payer: Self-pay | Admitting: Hematology & Oncology

## 2021-08-23 VITALS — BP 124/78 | HR 107 | Temp 98.5°F | Resp 19

## 2021-08-23 DIAGNOSIS — R7989 Other specified abnormal findings of blood chemistry: Secondary | ICD-10-CM

## 2021-08-23 DIAGNOSIS — C3490 Malignant neoplasm of unspecified part of unspecified bronchus or lung: Secondary | ICD-10-CM

## 2021-08-23 DIAGNOSIS — Z515 Encounter for palliative care: Secondary | ICD-10-CM

## 2021-08-23 DIAGNOSIS — C7951 Secondary malignant neoplasm of bone: Secondary | ICD-10-CM

## 2021-08-23 DIAGNOSIS — R109 Unspecified abdominal pain: Secondary | ICD-10-CM

## 2021-08-23 DIAGNOSIS — R52 Pain, unspecified: Secondary | ICD-10-CM | POA: Diagnosis not present

## 2021-08-23 DIAGNOSIS — R530 Neoplastic (malignant) related fatigue: Secondary | ICD-10-CM

## 2021-08-23 DIAGNOSIS — G893 Neoplasm related pain (acute) (chronic): Secondary | ICD-10-CM | POA: Diagnosis not present

## 2021-08-23 LAB — CMP (CANCER CENTER ONLY)
ALT: 27 U/L (ref 0–44)
AST: 20 U/L (ref 15–41)
Albumin: 3.3 g/dL — ABNORMAL LOW (ref 3.5–5.0)
Alkaline Phosphatase: 742 U/L — ABNORMAL HIGH (ref 38–126)
Anion gap: 12 (ref 5–15)
BUN: 12 mg/dL (ref 6–20)
CO2: 20 mmol/L — ABNORMAL LOW (ref 22–32)
Calcium: 8.5 mg/dL — ABNORMAL LOW (ref 8.9–10.3)
Chloride: 98 mmol/L (ref 98–111)
Creatinine: 0.55 mg/dL — ABNORMAL LOW (ref 0.61–1.24)
GFR, Estimated: >60 mL/min (ref >60)
Glucose, Bld: 143 mg/dL — ABNORMAL HIGH (ref 70–99)
Potassium: 3.5 mmol/L (ref 3.5–5.1)
Sodium: 130 mmol/L — ABNORMAL LOW (ref 135–145)
Total Bilirubin: 0.5 mg/dL (ref 0.3–1.2)
Total Protein: 6.2 g/dL — ABNORMAL LOW (ref 6.5–8.1)

## 2021-08-23 LAB — CBC WITH DIFFERENTIAL (CANCER CENTER ONLY)
Abs Immature Granulocytes: 0.04 10*3/uL (ref 0.00–0.07)
Basophils Absolute: 0.1 10*3/uL (ref 0.0–0.1)
Basophils Relative: 1 %
Eosinophils Absolute: 0 10*3/uL (ref 0.0–0.5)
Eosinophils Relative: 0 %
HCT: 39 % (ref 39.0–52.0)
Hemoglobin: 13.2 g/dL (ref 13.0–17.0)
Immature Granulocytes: 0 %
Lymphocytes Relative: 16 %
Lymphs Abs: 2.1 10*3/uL (ref 0.7–4.0)
MCH: 30.6 pg (ref 26.0–34.0)
MCHC: 33.8 g/dL (ref 30.0–36.0)
MCV: 90.3 fL (ref 80.0–100.0)
Monocytes Absolute: 1 10*3/uL (ref 0.1–1.0)
Monocytes Relative: 8 %
Neutro Abs: 10 10*3/uL — ABNORMAL HIGH (ref 1.7–7.7)
Neutrophils Relative %: 75 %
Platelet Count: 670 10*3/uL — ABNORMAL HIGH (ref 150–400)
RBC: 4.32 MIL/uL (ref 4.22–5.81)
RDW: 14.8 % (ref 11.5–15.5)
WBC Count: 13.3 10*3/uL — ABNORMAL HIGH (ref 4.0–10.5)
nRBC: 0 % (ref 0.0–0.2)

## 2021-08-23 MED ORDER — LORAZEPAM 2 MG/ML IJ SOLN
0.5000 mg | Freq: Once | INTRAMUSCULAR | Status: AC
  Administered 2021-08-23: 0.5 mg via INTRAVENOUS
  Filled 2021-08-23: qty 1

## 2021-08-23 MED ORDER — HYDROMORPHONE HCL 4 MG/ML IJ SOLN
4.0000 mg | Freq: Once | INTRAMUSCULAR | Status: AC
  Administered 2021-08-23: 4 mg via INTRAVENOUS
  Filled 2021-08-23: qty 1

## 2021-08-23 MED ORDER — ONDANSETRON 8 MG PO TBDP: 8.0000 mg | ORAL_TABLET | Freq: Three times a day (TID) | ORAL | 1 refills | Status: DC | PRN

## 2021-08-23 MED ORDER — SODIUM CHLORIDE 0.9 % IV SOLN: Freq: Once | INTRAVENOUS | Status: AC

## 2021-08-23 MED ORDER — HYDROMORPHONE HCL 1 MG/ML IJ SOLN
1.0000 mg | Freq: Once | INTRAMUSCULAR | Status: AC
  Administered 2021-08-23: 1 mg via INTRAVENOUS
  Filled 2021-08-23: qty 1

## 2021-08-23 NOTE — Progress Notes (Signed)
Per [name] ok to give one dose of pain medication prior to discharge with stable vitals. Pt continues to c/o pain to middle of his back. Pt will sit back and have eyes closed then wake up and ask for "more pain medicine and nausea medicine" Pt educated that he has received both and will receive one dose prior to discharge. Pt asking about receiving fluids and pt educated that he has already received one liter of fluids per MD order. Pt stated he was not driving home. Pt verbalized understanding and then sat back and  closed eyes.

## 2021-08-23 NOTE — Patient Instructions (Signed)

## 2021-08-24 ENCOUNTER — Ambulatory Visit (HOSPITAL_BASED_OUTPATIENT_CLINIC_OR_DEPARTMENT_OTHER)
Admission: RE | Admit: 2021-08-24 | Discharge: 2021-08-24 | Disposition: A | Discharge status: Home / Self Care | Payer: Medicare HMO | Source: Ambulatory Visit | Attending: Family | Admitting: Family

## 2021-08-24 DIAGNOSIS — C3490 Malignant neoplasm of unspecified part of unspecified bronchus or lung: Secondary | ICD-10-CM | POA: Insufficient documentation

## 2021-08-24 DIAGNOSIS — C7931 Secondary malignant neoplasm of brain: Secondary | ICD-10-CM | POA: Insufficient documentation

## 2021-08-24 DIAGNOSIS — R7989 Other specified abnormal findings of blood chemistry: Secondary | ICD-10-CM | POA: Insufficient documentation

## 2021-08-24 DIAGNOSIS — R109 Unspecified abdominal pain: Secondary | ICD-10-CM | POA: Insufficient documentation

## 2021-08-25 ENCOUNTER — Inpatient Hospital Stay (HOSPITAL_COMMUNITY)
Admission: EM | Admit: 2021-08-25 | Discharge: 2021-09-03 | DRG: 947 | Disposition: A | Discharge status: Home Health | Payer: Medicare HMO | Attending: Internal Medicine | Admitting: Internal Medicine

## 2021-08-25 ENCOUNTER — Observation Stay (HOSPITAL_COMMUNITY): Payer: Medicare HMO

## 2021-08-25 ENCOUNTER — Emergency Department (HOSPITAL_COMMUNITY): Payer: Medicare HMO

## 2021-08-25 ENCOUNTER — Encounter (HOSPITAL_COMMUNITY): Payer: Self-pay

## 2021-08-25 ENCOUNTER — Other Ambulatory Visit: Payer: Self-pay | Admitting: Radiation Therapy

## 2021-08-25 ENCOUNTER — Inpatient Hospital Stay: Payer: Medicare HMO

## 2021-08-25 ENCOUNTER — Other Ambulatory Visit: Payer: Self-pay

## 2021-08-25 ENCOUNTER — Ambulatory Visit: Payer: Medicare HMO | Admitting: Hematology & Oncology

## 2021-08-25 DIAGNOSIS — K219 Gastro-esophageal reflux disease without esophagitis: Secondary | ICD-10-CM | POA: Diagnosis present

## 2021-08-25 DIAGNOSIS — Z79891 Long term (current) use of opiate analgesic: Secondary | ICD-10-CM

## 2021-08-25 DIAGNOSIS — R54 Age-related physical debility: Secondary | ICD-10-CM | POA: Diagnosis present

## 2021-08-25 DIAGNOSIS — I2510 Atherosclerotic heart disease of native coronary artery without angina pectoris: Secondary | ICD-10-CM | POA: Diagnosis present

## 2021-08-25 DIAGNOSIS — D75839 Thrombocytosis, unspecified: Secondary | ICD-10-CM | POA: Diagnosis present

## 2021-08-25 DIAGNOSIS — Z881 Allergy status to other antibiotic agents status: Secondary | ICD-10-CM

## 2021-08-25 DIAGNOSIS — F419 Anxiety disorder, unspecified: Secondary | ICD-10-CM

## 2021-08-25 DIAGNOSIS — K429 Umbilical hernia without obstruction or gangrene: Secondary | ICD-10-CM | POA: Diagnosis present

## 2021-08-25 DIAGNOSIS — M5440 Lumbago with sciatica, unspecified side: Secondary | ICD-10-CM

## 2021-08-25 DIAGNOSIS — M4726 Other spondylosis with radiculopathy, lumbar region: Secondary | ICD-10-CM | POA: Diagnosis present

## 2021-08-25 DIAGNOSIS — C7951 Secondary malignant neoplasm of bone: Secondary | ICD-10-CM | POA: Diagnosis present

## 2021-08-25 DIAGNOSIS — Z7982 Long term (current) use of aspirin: Secondary | ICD-10-CM

## 2021-08-25 DIAGNOSIS — E872 Acidosis, unspecified: Secondary | ICD-10-CM | POA: Diagnosis present

## 2021-08-25 DIAGNOSIS — Z6821 Body mass index (BMI) 21.0-21.9, adult: Secondary | ICD-10-CM

## 2021-08-25 DIAGNOSIS — R6510 Systemic inflammatory response syndrome (SIRS) of non-infectious origin without acute organ dysfunction: Secondary | ICD-10-CM

## 2021-08-25 DIAGNOSIS — G893 Neoplasm related pain (acute) (chronic): Principal | ICD-10-CM | POA: Diagnosis present

## 2021-08-25 DIAGNOSIS — C772 Secondary and unspecified malignant neoplasm of intra-abdominal lymph nodes: Secondary | ICD-10-CM | POA: Diagnosis present

## 2021-08-25 DIAGNOSIS — M5116 Intervertebral disc disorders with radiculopathy, lumbar region: Secondary | ICD-10-CM | POA: Diagnosis present

## 2021-08-25 DIAGNOSIS — Z87891 Personal history of nicotine dependence: Secondary | ICD-10-CM

## 2021-08-25 DIAGNOSIS — R531 Weakness: Secondary | ICD-10-CM

## 2021-08-25 DIAGNOSIS — E871 Hypo-osmolality and hyponatremia: Secondary | ICD-10-CM

## 2021-08-25 DIAGNOSIS — K4020 Bilateral inguinal hernia, without obstruction or gangrene, not specified as recurrent: Secondary | ICD-10-CM | POA: Diagnosis present

## 2021-08-25 DIAGNOSIS — K5900 Constipation, unspecified: Secondary | ICD-10-CM

## 2021-08-25 DIAGNOSIS — K3184 Gastroparesis: Secondary | ICD-10-CM | POA: Diagnosis present

## 2021-08-25 DIAGNOSIS — I81 Portal vein thrombosis: Secondary | ICD-10-CM | POA: Diagnosis present

## 2021-08-25 DIAGNOSIS — R52 Pain, unspecified: Secondary | ICD-10-CM | POA: Diagnosis not present

## 2021-08-25 DIAGNOSIS — C7931 Secondary malignant neoplasm of brain: Secondary | ICD-10-CM | POA: Diagnosis not present

## 2021-08-25 DIAGNOSIS — E1165 Type 2 diabetes mellitus with hyperglycemia: Secondary | ICD-10-CM | POA: Diagnosis present

## 2021-08-25 DIAGNOSIS — Z79899 Other long term (current) drug therapy: Secondary | ICD-10-CM

## 2021-08-25 DIAGNOSIS — Z888 Allergy status to other drugs, medicaments and biological substances status: Secondary | ICD-10-CM

## 2021-08-25 DIAGNOSIS — Z7984 Long term (current) use of oral hypoglycemic drugs: Secondary | ICD-10-CM

## 2021-08-25 DIAGNOSIS — J9601 Acute respiratory failure with hypoxia: Secondary | ICD-10-CM | POA: Diagnosis present

## 2021-08-25 DIAGNOSIS — Z515 Encounter for palliative care: Secondary | ICD-10-CM

## 2021-08-25 DIAGNOSIS — D649 Anemia, unspecified: Secondary | ICD-10-CM

## 2021-08-25 DIAGNOSIS — R530 Neoplastic (malignant) related fatigue: Secondary | ICD-10-CM

## 2021-08-25 DIAGNOSIS — M549 Dorsalgia, unspecified: Principal | ICD-10-CM

## 2021-08-25 DIAGNOSIS — I252 Old myocardial infarction: Secondary | ICD-10-CM

## 2021-08-25 DIAGNOSIS — E1143 Type 2 diabetes mellitus with diabetic autonomic (poly)neuropathy: Secondary | ICD-10-CM | POA: Diagnosis present

## 2021-08-25 DIAGNOSIS — E8809 Other disorders of plasma-protein metabolism, not elsewhere classified: Secondary | ICD-10-CM | POA: Diagnosis present

## 2021-08-25 DIAGNOSIS — R748 Abnormal levels of other serum enzymes: Secondary | ICD-10-CM | POA: Diagnosis present

## 2021-08-25 DIAGNOSIS — C799 Secondary malignant neoplasm of unspecified site: Secondary | ICD-10-CM

## 2021-08-25 DIAGNOSIS — Z923 Personal history of irradiation: Secondary | ICD-10-CM

## 2021-08-25 DIAGNOSIS — Z9221 Personal history of antineoplastic chemotherapy: Secondary | ICD-10-CM

## 2021-08-25 DIAGNOSIS — Z902 Acquired absence of lung [part of]: Secondary | ICD-10-CM

## 2021-08-25 DIAGNOSIS — Z720 Tobacco use: Secondary | ICD-10-CM | POA: Diagnosis present

## 2021-08-25 DIAGNOSIS — F32A Depression, unspecified: Secondary | ICD-10-CM | POA: Diagnosis present

## 2021-08-25 DIAGNOSIS — C787 Secondary malignant neoplasm of liver and intrahepatic bile duct: Secondary | ICD-10-CM | POA: Diagnosis present

## 2021-08-25 DIAGNOSIS — C3490 Malignant neoplasm of unspecified part of unspecified bronchus or lung: Secondary | ICD-10-CM | POA: Diagnosis not present

## 2021-08-25 DIAGNOSIS — E785 Hyperlipidemia, unspecified: Secondary | ICD-10-CM | POA: Diagnosis present

## 2021-08-25 DIAGNOSIS — R110 Nausea: Secondary | ICD-10-CM

## 2021-08-25 DIAGNOSIS — E119 Type 2 diabetes mellitus without complications: Secondary | ICD-10-CM

## 2021-08-25 DIAGNOSIS — J449 Chronic obstructive pulmonary disease, unspecified: Secondary | ICD-10-CM | POA: Diagnosis present

## 2021-08-25 DIAGNOSIS — E440 Moderate protein-calorie malnutrition: Secondary | ICD-10-CM | POA: Insufficient documentation

## 2021-08-25 DIAGNOSIS — I700 Atherosclerosis of aorta: Secondary | ICD-10-CM | POA: Diagnosis present

## 2021-08-25 LAB — CBC WITH DIFFERENTIAL/PLATELET
Abs Immature Granulocytes: 0.07 10*3/uL (ref 0.00–0.07)
Basophils Absolute: 0.1 10*3/uL (ref 0.0–0.1)
Basophils Relative: 0 %
Eosinophils Absolute: 0 10*3/uL (ref 0.0–0.5)
Eosinophils Relative: 0 %
HCT: 36.3 % — ABNORMAL LOW (ref 39.0–52.0)
Hemoglobin: 12 g/dL — ABNORMAL LOW (ref 13.0–17.0)
Immature Granulocytes: 1 %
Lymphocytes Relative: 10 %
Lymphs Abs: 1.3 10*3/uL (ref 0.7–4.0)
MCH: 30.2 pg (ref 26.0–34.0)
MCHC: 33.1 g/dL (ref 30.0–36.0)
MCV: 91.2 fL (ref 80.0–100.0)
Monocytes Absolute: 0.9 10*3/uL (ref 0.1–1.0)
Monocytes Relative: 7 %
Neutro Abs: 10.4 10*3/uL — ABNORMAL HIGH (ref 1.7–7.7)
Neutrophils Relative %: 82 %
Platelets: 591 10*3/uL — ABNORMAL HIGH (ref 150–400)
RBC: 3.98 MIL/uL — ABNORMAL LOW (ref 4.22–5.81)
RDW: 15 % (ref 11.5–15.5)
WBC: 12.8 10*3/uL — ABNORMAL HIGH (ref 4.0–10.5)
nRBC: 0 % (ref 0.0–0.2)

## 2021-08-25 LAB — COMPREHENSIVE METABOLIC PANEL
ALT: 25 U/L (ref 0–44)
AST: 24 U/L (ref 15–41)
Albumin: 2.7 g/dL — ABNORMAL LOW (ref 3.5–5.0)
Alkaline Phosphatase: 710 U/L — ABNORMAL HIGH (ref 38–126)
Anion gap: 10 (ref 5–15)
BUN: 11 mg/dL (ref 6–20)
CO2: 20 mmol/L — ABNORMAL LOW (ref 22–32)
Calcium: 8.4 mg/dL — ABNORMAL LOW (ref 8.9–10.3)
Chloride: 102 mmol/L (ref 98–111)
Creatinine, Ser: 0.49 mg/dL — ABNORMAL LOW (ref 0.61–1.24)
GFR, Estimated: >60 mL/min (ref >60)
Glucose, Bld: 153 mg/dL — ABNORMAL HIGH (ref 70–99)
Potassium: 3.5 mmol/L (ref 3.5–5.1)
Sodium: 132 mmol/L — ABNORMAL LOW (ref 135–145)
Total Bilirubin: 0.9 mg/dL (ref 0.3–1.2)
Total Protein: 6.4 g/dL — ABNORMAL LOW (ref 6.5–8.1)

## 2021-08-25 LAB — HEPARIN LEVEL (UNFRACTIONATED)
Heparin Unfractionated: 0.16 IU/mL — ABNORMAL LOW (ref 0.30–0.70)
Heparin Unfractionated: 0.31 IU/mL (ref 0.30–0.70)

## 2021-08-25 LAB — GLUCOSE, CAPILLARY
Glucose-Capillary: 107 mg/dL — ABNORMAL HIGH (ref 70–99)
Glucose-Capillary: 118 mg/dL — ABNORMAL HIGH (ref 70–99)

## 2021-08-25 LAB — APTT: aPTT: 38 seconds — ABNORMAL HIGH (ref 24–36)

## 2021-08-25 LAB — URINALYSIS, ROUTINE W REFLEX MICROSCOPIC
Bilirubin Urine: NEGATIVE
Glucose, UA: NEGATIVE mg/dL
Hgb urine dipstick: NEGATIVE
Ketones, ur: 20 mg/dL — AB
Leukocytes,Ua: NEGATIVE
Nitrite: NEGATIVE
Protein, ur: NEGATIVE mg/dL
Specific Gravity, Urine: 1.006 (ref 1.005–1.030)
pH: 7 (ref 5.0–8.0)

## 2021-08-25 LAB — LIPASE, BLOOD: Lipase: 19 U/L (ref 11–51)

## 2021-08-25 LAB — IRON AND TIBC
Iron: 31 ug/dL — ABNORMAL LOW (ref 45–182)
Saturation Ratios: 15 % — ABNORMAL LOW (ref 17.9–39.5)
TIBC: 207 ug/dL — ABNORMAL LOW (ref 250–450)
UIBC: 176 ug/dL

## 2021-08-25 LAB — PROTIME-INR
INR: 1.6 — ABNORMAL HIGH (ref 0.8–1.2)
Prothrombin Time: 18.9 seconds — ABNORMAL HIGH (ref 11.4–15.2)

## 2021-08-25 LAB — HEMOGLOBIN A1C
Hgb A1c MFr Bld: 7.1 % — ABNORMAL HIGH (ref 4.8–5.6)
Mean Plasma Glucose: 157.07 mg/dL

## 2021-08-25 LAB — HIV ANTIBODY (ROUTINE TESTING W REFLEX): HIV Screen 4th Generation wRfx: NONREACTIVE

## 2021-08-25 IMAGING — MR MR LUMBAR SPINE WO/W CM
8 of 17 series · 19 of 48 positions shown · IV contrast (gadavist)
Comparison: CT lumbar myelogram dated [DATE]. MRI thoracic
and lumbar spine dated [DATE].

CLINICAL DATA: Worsening back and right leg pain. History of
metastatic lung cancer.

EXAM:
MRI THORACIC AND LUMBAR SPINE WITHOUT AND WITH CONTRAST
TECHNIQUE: Multiplanar and multiecho pulse sequences of the thoracic and lumbar
spine were obtained without and with intravenous contrast.
CONTRAST:  7mL GADAVIST GADOBUTROL 1 MMOL/ML IV SOLN

[Series 16: T1 · sagittal · 4.0mm · 0.90mm/px · 1 of 12 slices shown (1 of 3)]
[im 1/12]
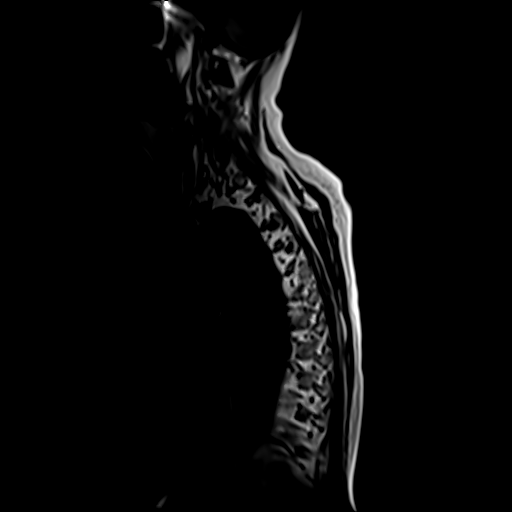

[Series 18: T1 · sagittal · 3.0mm · 0.62mm/px · 1 of 15 slices shown (2 of 3)]
[im 1/15]
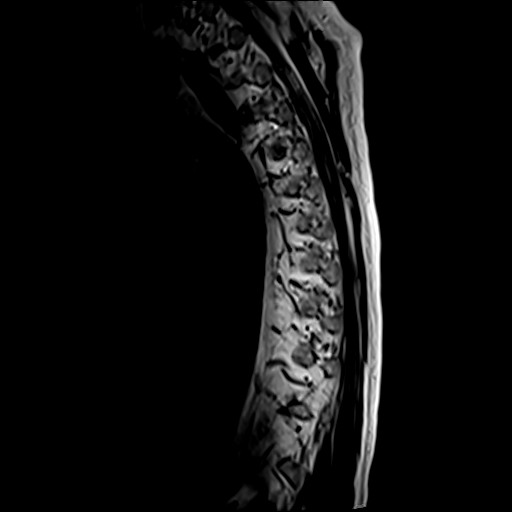

[Series 19: T2 · sagittal · 3.0mm · 1.00mm/px · 1 of 15 slices shown (1 of 3)]
[im 1/15]
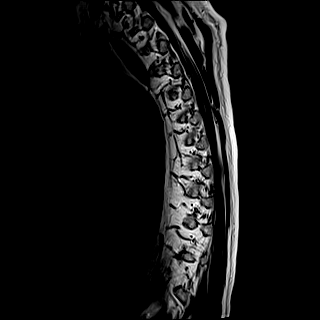

[Series 20: T2 · axial · 4.0mm · 0.39mm/px · z∈[-267,-45]mm · 5 of 47 slices shown (2 of 3)]
[im 1/47]
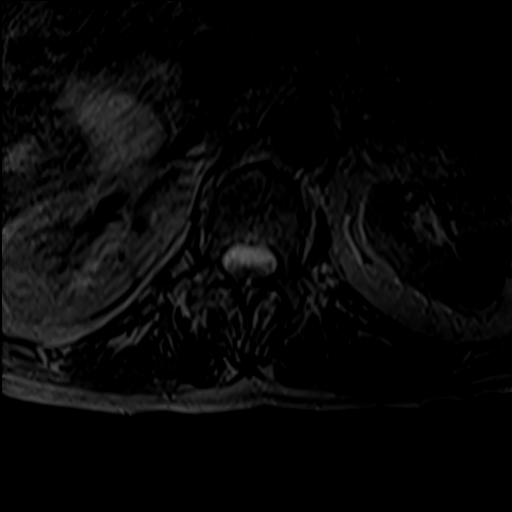
[im 12/47]
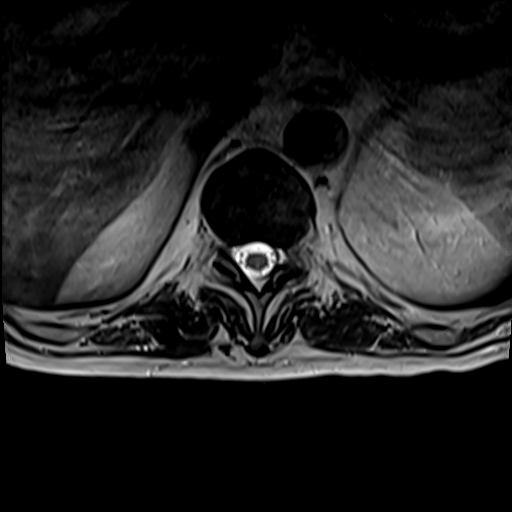
[im 24/47]
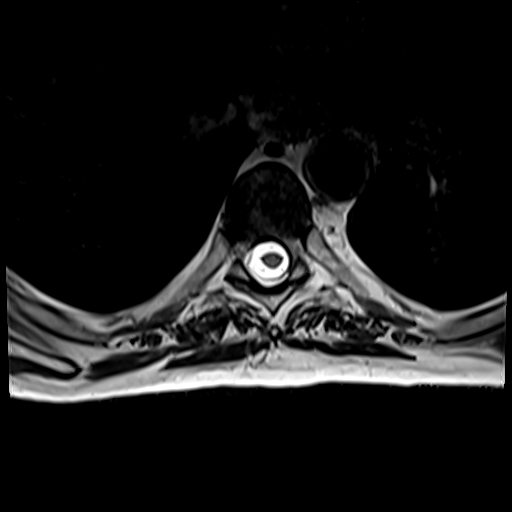
[im 35/47]
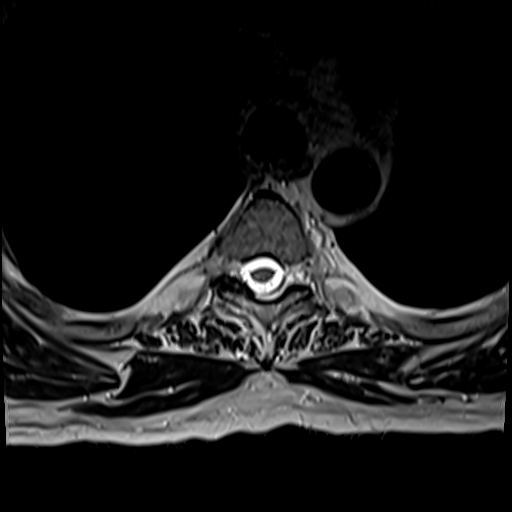
[im 47/47]
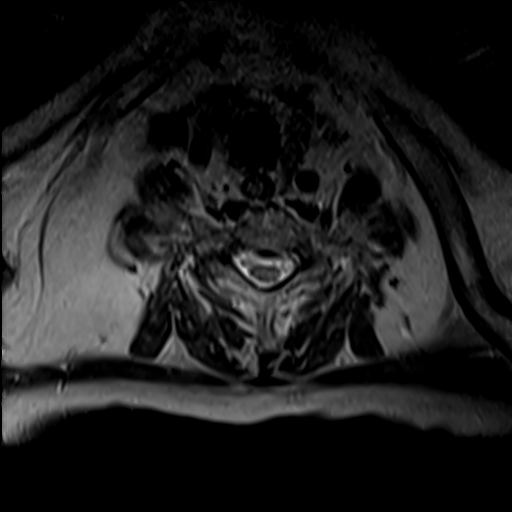

[Series 22: T1 · axial · 4.0mm · 0.39mm/px · z∈[-267,-94]mm · 4 of 47 slices shown (3 of 3)]
[im 1/47]
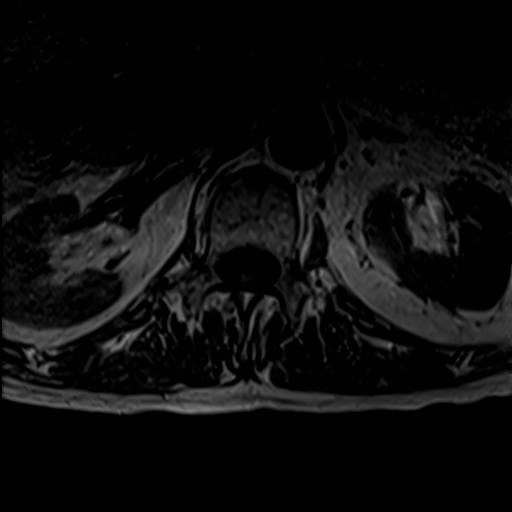
[im 12/47]
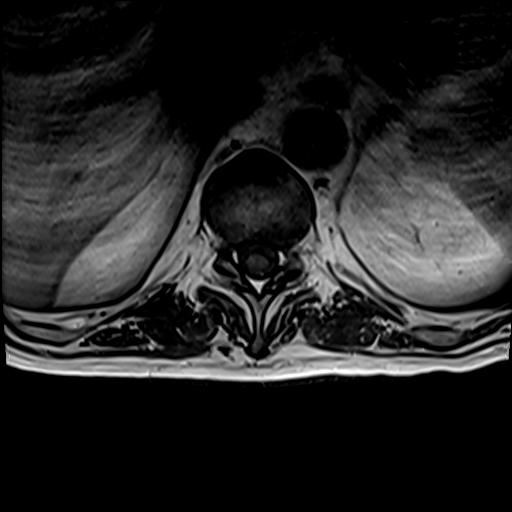
[im 24/47]
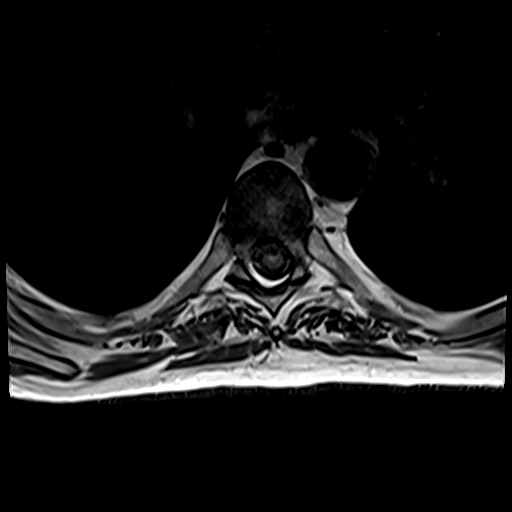
[im 35/47]
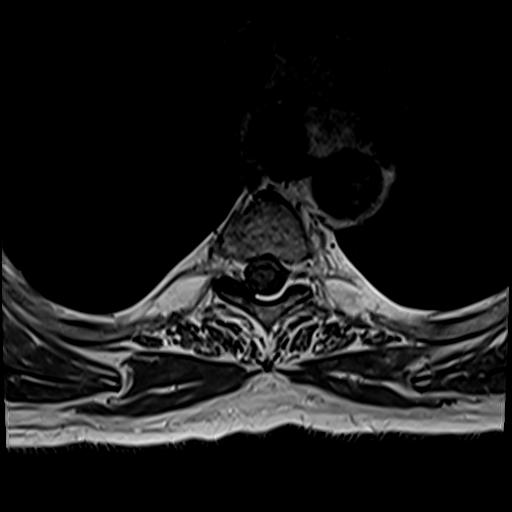

[Series 25: T2 · axial · 4.0mm · 0.39mm/px · z∈[-475,-251]mm · 4 of 43 slices shown (3 of 3)]
[im 1/43]
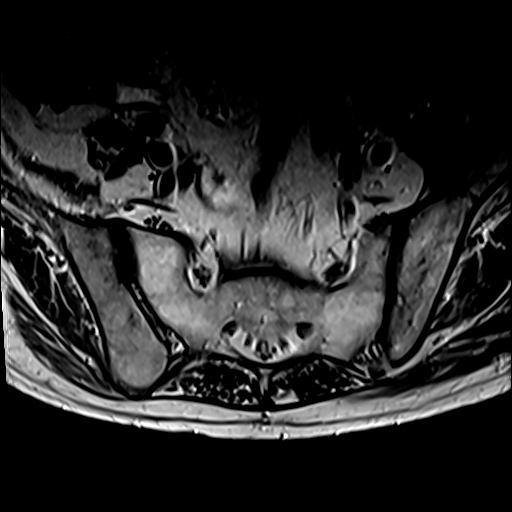
[im 15/43]
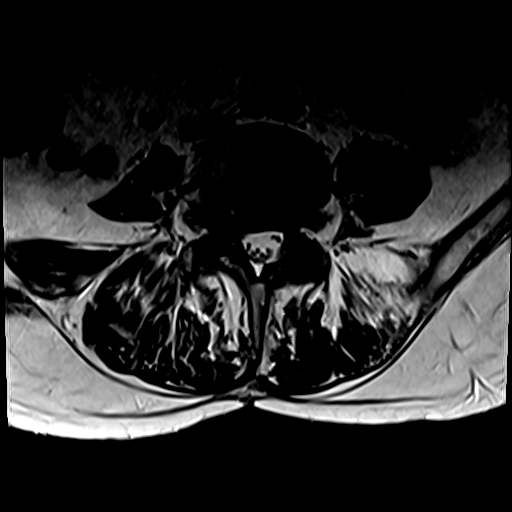
[im 29/43]
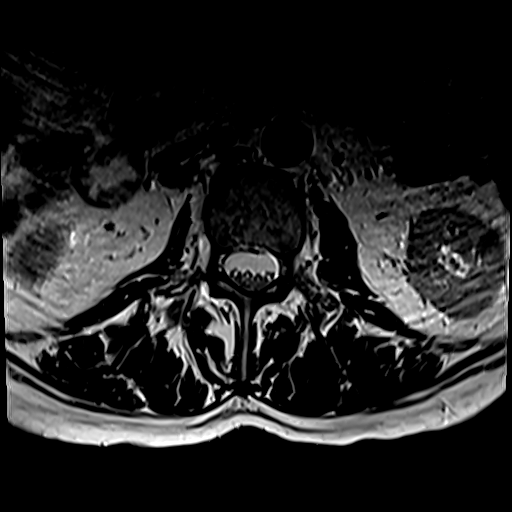
[im 43/43]
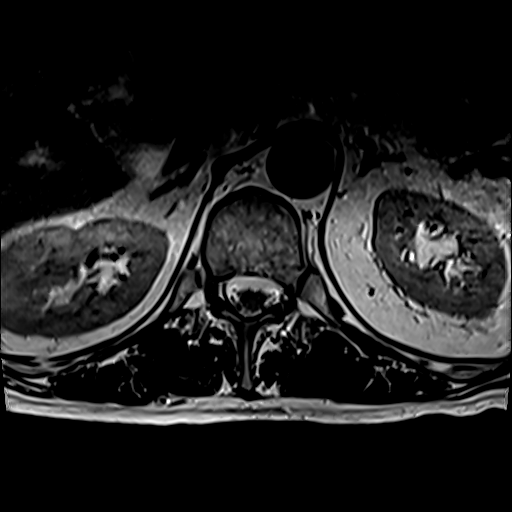

[Series 27: T2 post-contrast · sagittal · 4.0mm · 0.51mm/px · 2 of 17 slices shown (1 of 2)]
[im 1/17]
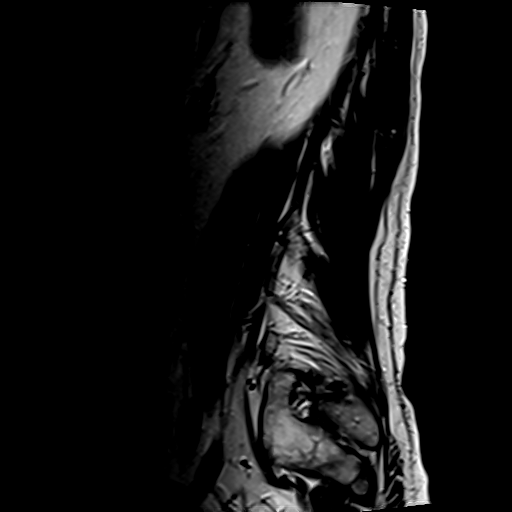
[im 17/17]
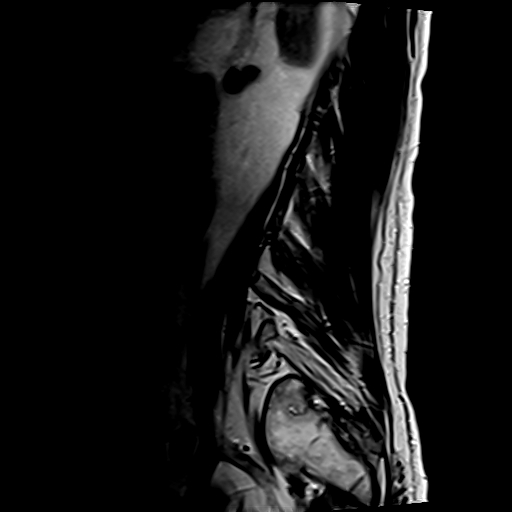

[Series 30: T2 post-contrast · sagittal · 3.0mm · 0.83mm/px · 1 of 15 slices shown (2 of 2)]
[im 1/15]
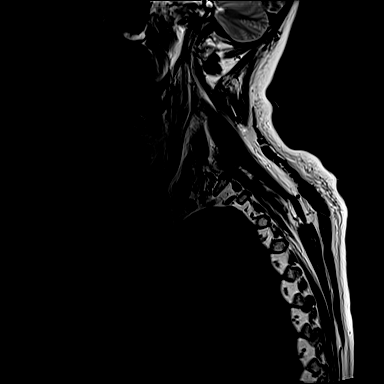

[19 of 48 positions shown; findings below may reference images not displayed]

FINDINGS: MRI THORACIC SPINE FINDINGS

Alignment:  Physiologic.

Vertebrae: T3, T6, T8, and T11 metastases have increased in size.
New tiny lesions at T10 and T12 (series 17, images 5 and 7). No
epidural tumor extension. No fracture or evidence of discitis.

Cord: Normal signal and morphology. No abnormal intrathecal
enhancement.

Paraspinal and other soft tissues: Negative.

Disc levels:

Unchanged small right paracentral disc protrusions at T1-T2 and
T2-T3. No spinal canal or neuroforaminal stenosis at any level.

MRI LUMBAR SPINE FINDINGS

Segmentation:  Standard.

Alignment:  Unchanged mild retrolisthesis at L3-L4.

Vertebrae: L4 and S2 vertebral body metastases have increased in
size. New L1, L2, L3, S1, and S3 vertebral body metastases. No
epidural tumor extension. No fracture or evidence of discitis.

Conus medullaris: Extends to the L1-L2 level and appears normal. No
abnormal intrathecal enhancement.

Paraspinal and other soft tissues: Enlarging metastases in the
bilateral posterior iliac bones. Enlarging upper abdominal
lymphadenopathy.

Disc levels:

T12-L1:  Negative.

L1-L2:  Negative.

L2-L3: Negative disc. Unchanged mild bilateral facet arthropathy. No
stenosis.

L3-L4: Unchanged mild disc bulging with superimposed right
subarticular disc protrusion. Unchanged mild bilateral facet
arthropathy. Unchanged mild spinal canal stenosis. Unchanged
moderate right and mild left lateral recess stenosis. Unchanged mild
bilateral neuroforaminal stenosis.

L4-L5: Prior left hemilaminectomy. Unchanged mild disc bulging with
superimposed right extraforaminal disc protrusion contacting the
exiting right L4 nerve root. Unchanged mild right lateral recess and
bilateral neuroforaminal stenosis. No spinal canal stenosis.

L5-S1: Unchanged small left paracentral disc protrusion and mild
bilateral facet arthropathy. No stenosis.
IMPRESSION: 1. New and progressive thoracic and lumbosacral osseous metastatic
disease without significant extraosseous extension or pathologic
fracture.
2. Enlarging bilateral iliac bone and upper abdominal nodal
metastatic disease.
3. Unchanged multilevel lumbar spondylosis as described above. Right
extraforaminal disc protrusion contacts and potentially irritates
the exiting right L4 nerve root.

## 2021-08-25 IMAGING — MR MR THORACIC SPINE WO/W CM
8 of 17 series · 19 of 48 positions shown · IV contrast (gadavist)
Comparison: CT lumbar myelogram dated [DATE]. MRI thoracic
and lumbar spine dated [DATE].

CLINICAL DATA: Worsening back and right leg pain. History of
metastatic lung cancer.

EXAM:
MRI THORACIC AND LUMBAR SPINE WITHOUT AND WITH CONTRAST
TECHNIQUE: Multiplanar and multiecho pulse sequences of the thoracic and lumbar
spine were obtained without and with intravenous contrast.
CONTRAST:  7mL GADAVIST GADOBUTROL 1 MMOL/ML IV SOLN

[Series 17: T1 · sagittal · 4.0mm · 0.90mm/px · 1 of 12 slices shown (1 of 3)]
[im 1/12]
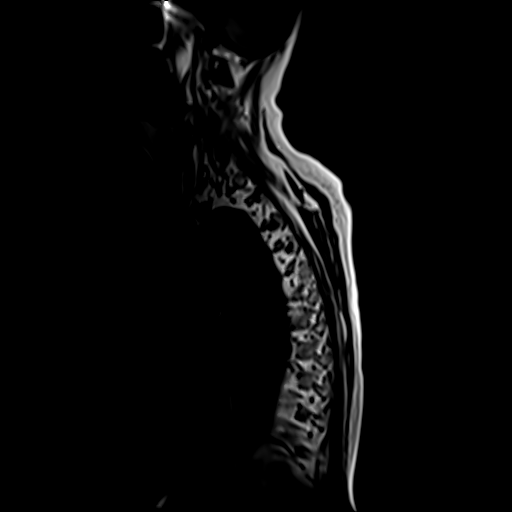

[Series 19: T1 · sagittal · 3.0mm · 0.62mm/px · 1 of 15 slices shown (2 of 3)]
[im 1/15]
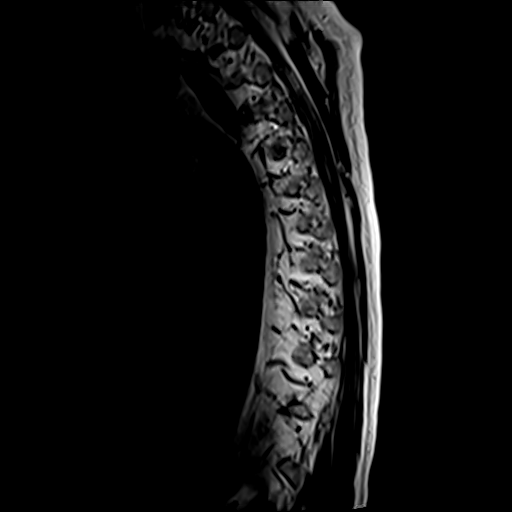

[Series 20: T2 · sagittal · 3.0mm · 1.00mm/px · 1 of 15 slices shown (1 of 3)]
[im 1/15]
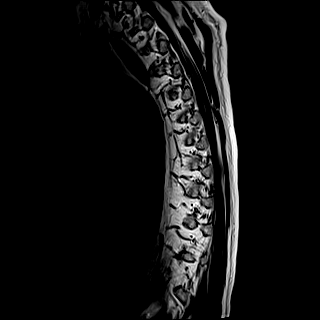

[Series 21: T2 · axial · 4.0mm · 0.39mm/px · z∈[-267,-45]mm · 5 of 47 slices shown (2 of 3)]
[im 1/47]
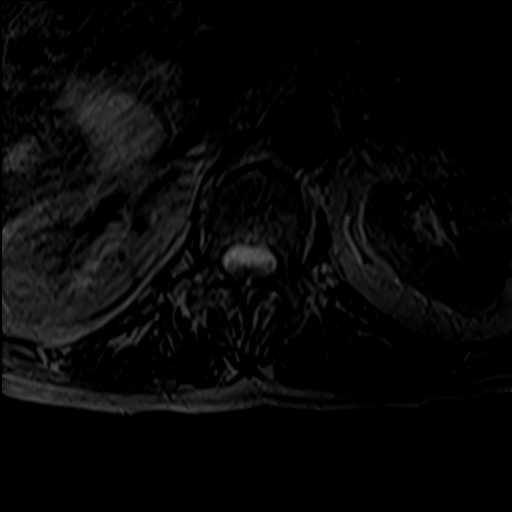
[im 12/47]
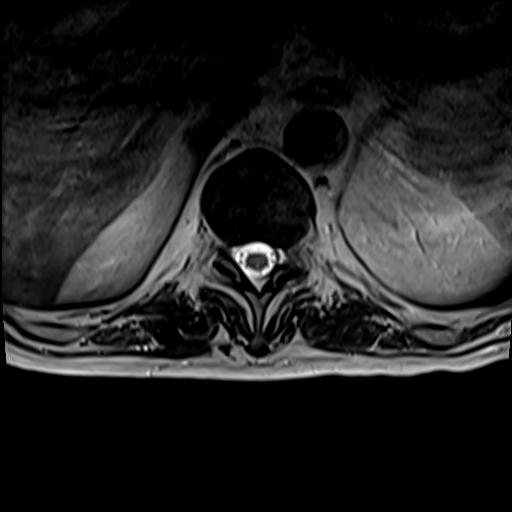
[im 24/47]
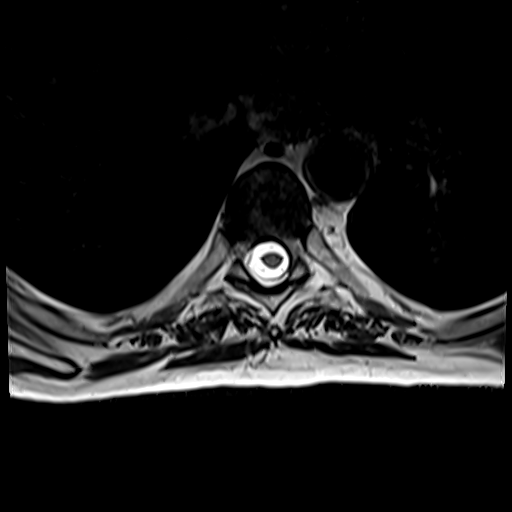
[im 35/47]
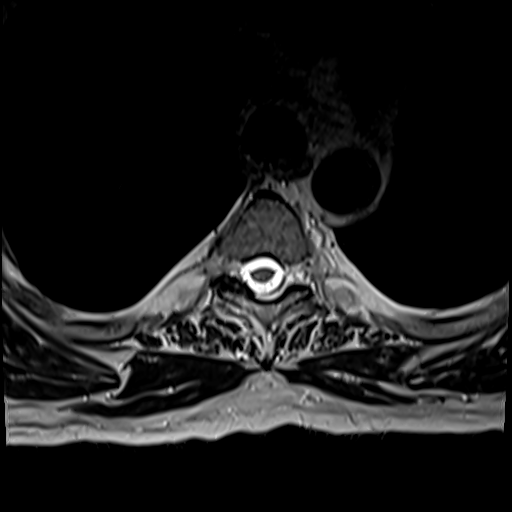
[im 47/47]
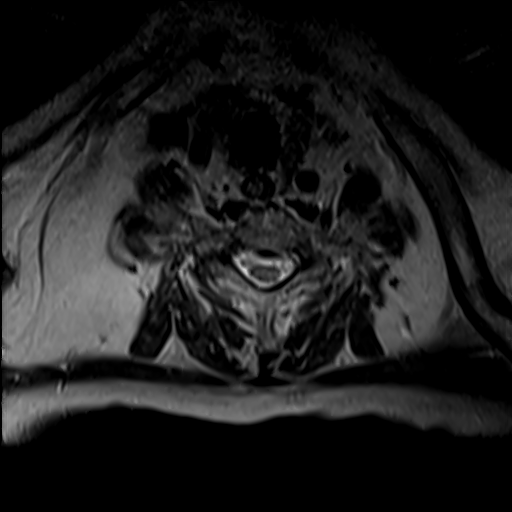

[Series 23: T1 · axial · 4.0mm · 0.39mm/px · z∈[-267,-94]mm · 4 of 47 slices shown (3 of 3)]
[im 1/47]
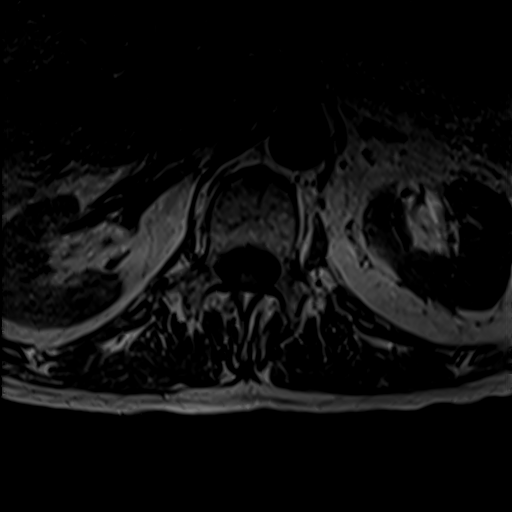
[im 12/47]
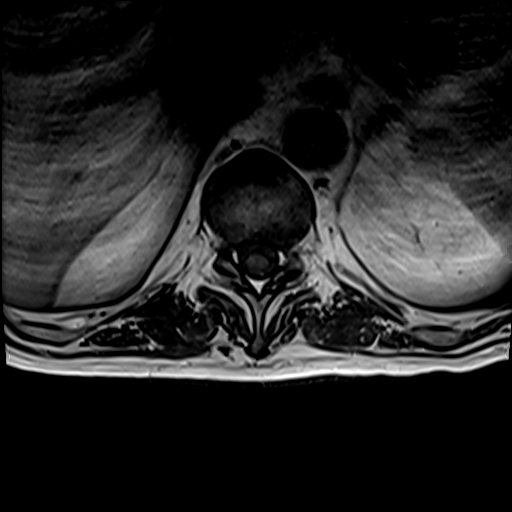
[im 24/47]
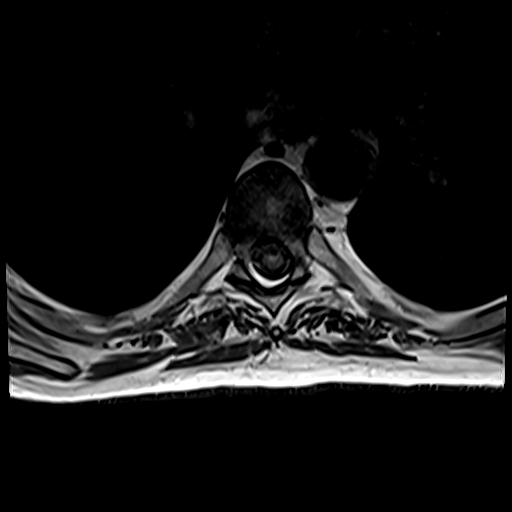
[im 35/47]
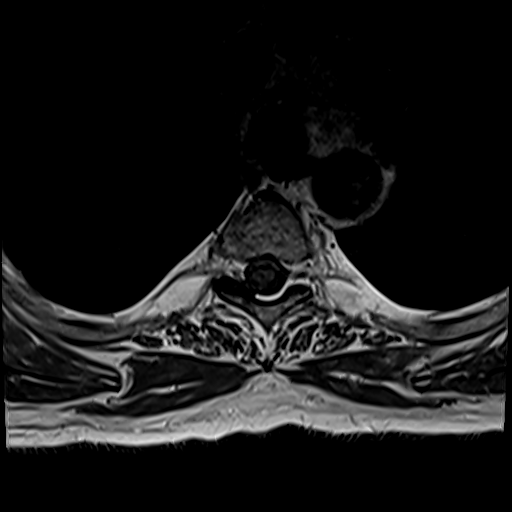

[Series 26: T2 · axial · 4.0mm · 0.39mm/px · z∈[-475,-251]mm · 4 of 43 slices shown (3 of 3)]
[im 1/43]
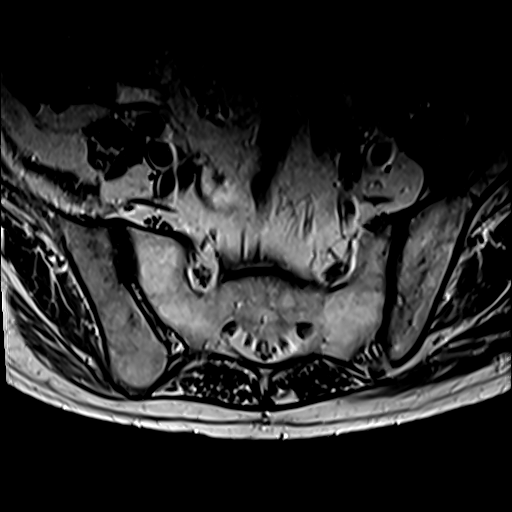
[im 15/43]
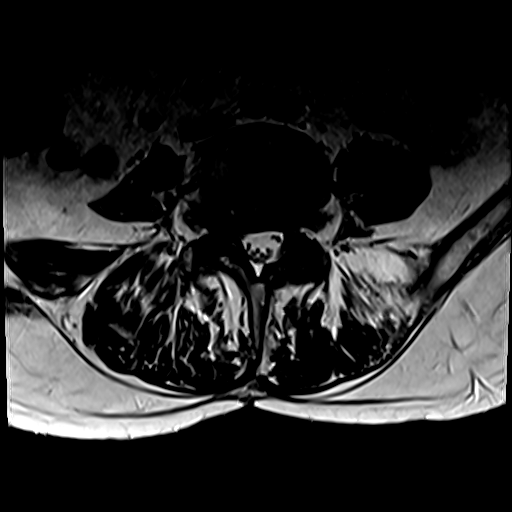
[im 29/43]
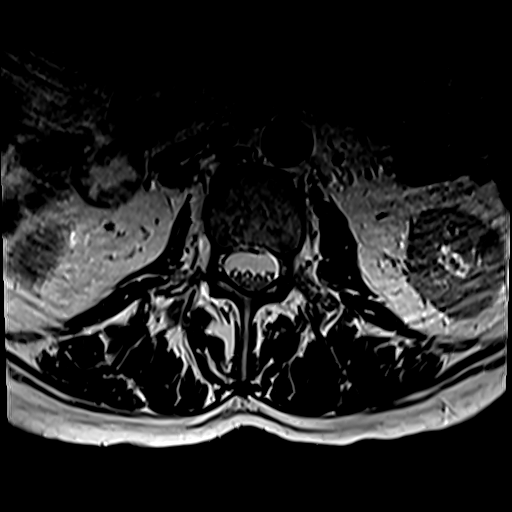
[im 43/43]
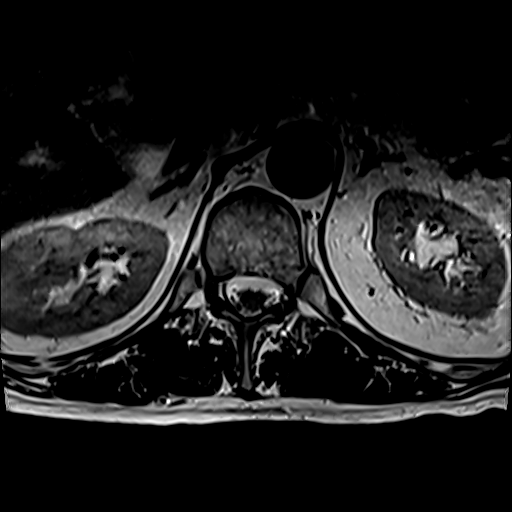

[Series 28: T2 post-contrast · sagittal · 4.0mm · 0.51mm/px · 2 of 17 slices shown (1 of 2)]
[im 1/17]
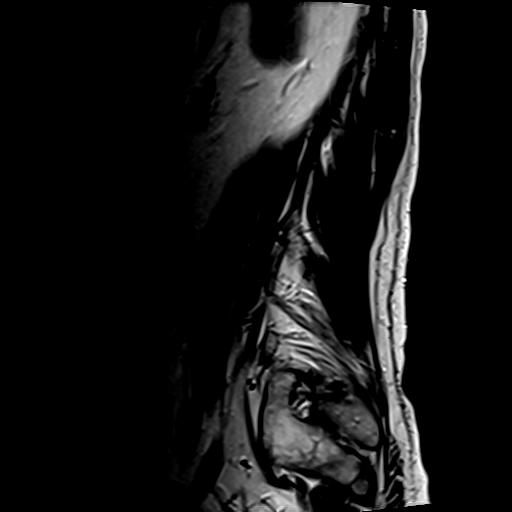
[im 17/17]
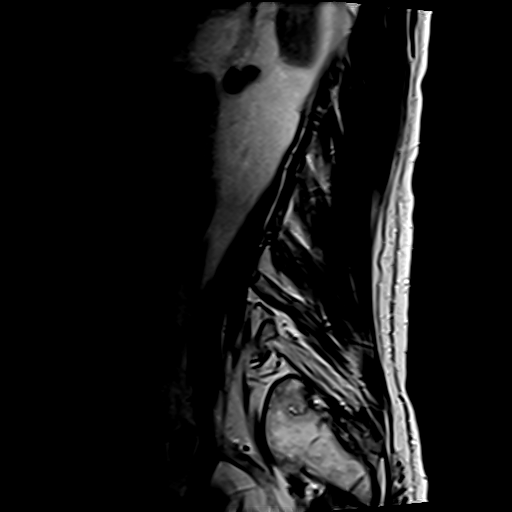

[Series 33: T2 post-contrast · sagittal · 3.0mm · 0.83mm/px · 1 of 15 slices shown (2 of 2)]
[im 1/15]
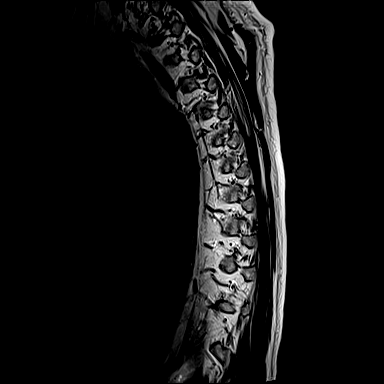

[19 of 48 positions shown; findings below may reference images not displayed]

FINDINGS: MRI THORACIC SPINE FINDINGS

Alignment:  Physiologic.

Vertebrae: T3, T6, T8, and T11 metastases have increased in size.
New tiny lesions at T10 and T12 (series 17, images 5 and 7). No
epidural tumor extension. No fracture or evidence of discitis.

Cord: Normal signal and morphology. No abnormal intrathecal
enhancement.

Paraspinal and other soft tissues: Negative.

Disc levels:

Unchanged small right paracentral disc protrusions at T1-T2 and
T2-T3. No spinal canal or neuroforaminal stenosis at any level.

MRI LUMBAR SPINE FINDINGS

Segmentation:  Standard.

Alignment:  Unchanged mild retrolisthesis at L3-L4.

Vertebrae: L4 and S2 vertebral body metastases have increased in
size. New L1, L2, L3, S1, and S3 vertebral body metastases. No
epidural tumor extension. No fracture or evidence of discitis.

Conus medullaris: Extends to the L1-L2 level and appears normal. No
abnormal intrathecal enhancement.

Paraspinal and other soft tissues: Enlarging metastases in the
bilateral posterior iliac bones. Enlarging upper abdominal
lymphadenopathy.

Disc levels:

T12-L1:  Negative.

L1-L2:  Negative.

L2-L3: Negative disc. Unchanged mild bilateral facet arthropathy. No
stenosis.

L3-L4: Unchanged mild disc bulging with superimposed right
subarticular disc protrusion. Unchanged mild bilateral facet
arthropathy. Unchanged mild spinal canal stenosis. Unchanged
moderate right and mild left lateral recess stenosis. Unchanged mild
bilateral neuroforaminal stenosis.

L4-L5: Prior left hemilaminectomy. Unchanged mild disc bulging with
superimposed right extraforaminal disc protrusion contacting the
exiting right L4 nerve root. Unchanged mild right lateral recess and
bilateral neuroforaminal stenosis. No spinal canal stenosis.

L5-S1: Unchanged small left paracentral disc protrusion and mild
bilateral facet arthropathy. No stenosis.
IMPRESSION: 1. New and progressive thoracic and lumbosacral osseous metastatic
disease without significant extraosseous extension or pathologic
fracture.
2. Enlarging bilateral iliac bone and upper abdominal nodal
metastatic disease.
3. Unchanged multilevel lumbar spondylosis as described above. Right
extraforaminal disc protrusion contacts and potentially irritates
the exiting right L4 nerve root.

## 2021-08-25 IMAGING — CT CT L SPINE W/O CM
4 of 5 series · 16 of 33 positions shown, 18 images · IV contrast (OMNIPAQUE 300)
Comparison: CT lumbar myelogram dated [DATE]. MRI thoracic
and lumbar spine dated [DATE].

CLINICAL DATA: Worsening back and right leg pain. History of
metastatic lung cancer.



[Series 3: bone lumbar · axial · 0.41mm/px · z∈[+1378,+1564]mm · 4 of 155 slices shown, 5 images]
[im 31/155  soft-tissue]
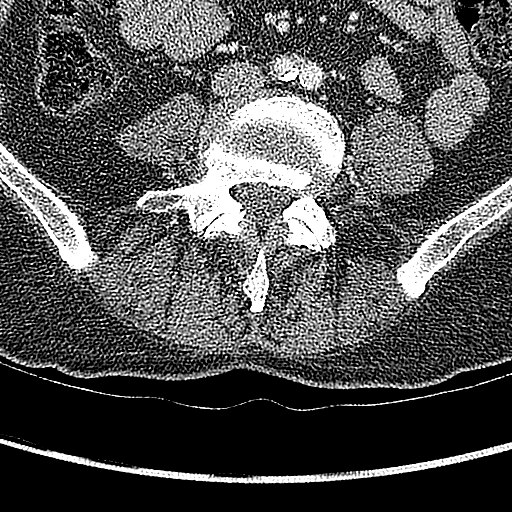
[im 31/155  bone]
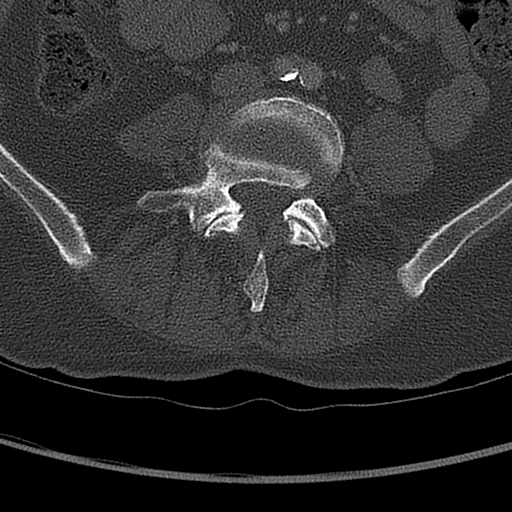
[im 62/155  bone]
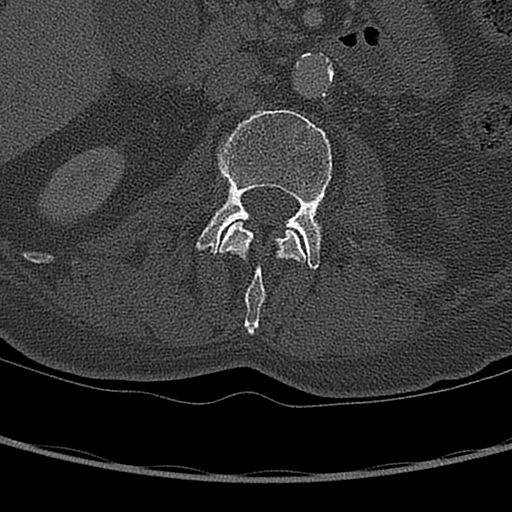
[im 93/155  bone]
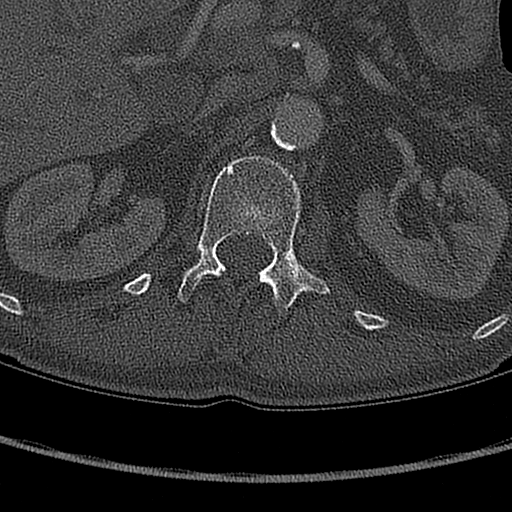
[im 124/155  bone]
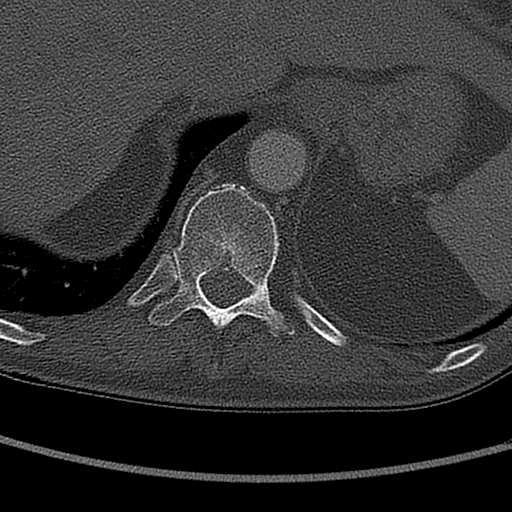

[Series 502: <mpr thick range> · coronal · 0.60mm/px · 3 of 66 slices shown]
[im 14/66  bone]
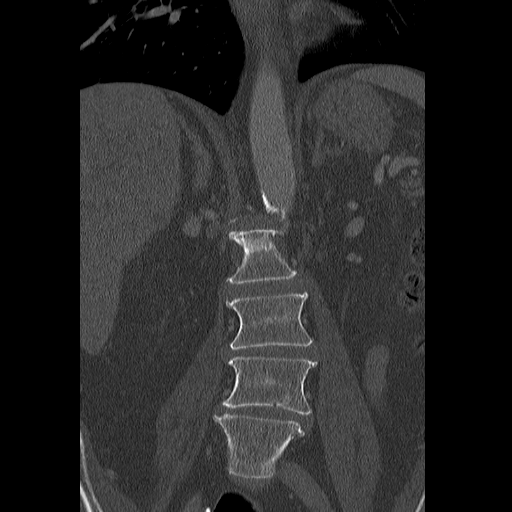
[im 27/66  bone]
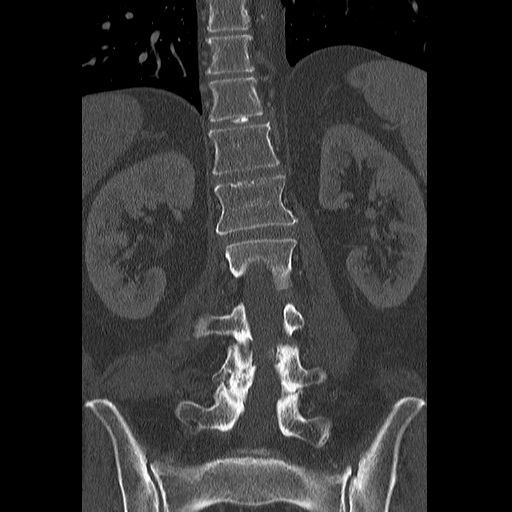
[im 40/66  bone]
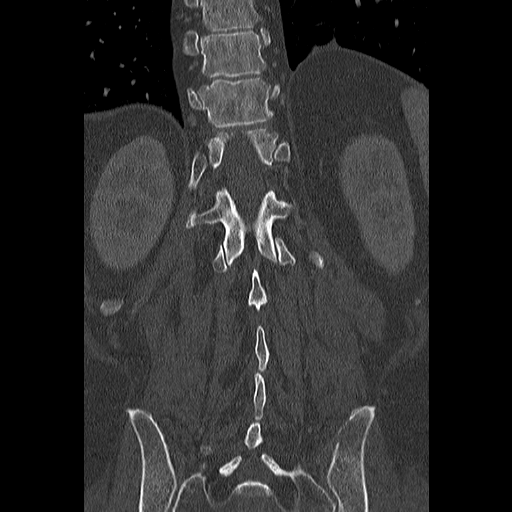

[Series 503: <mpr thick range(1)> · sagittal · 0.60mm/px · 5 of 66 slices shown, 6 images]
[im 22/66  bone]
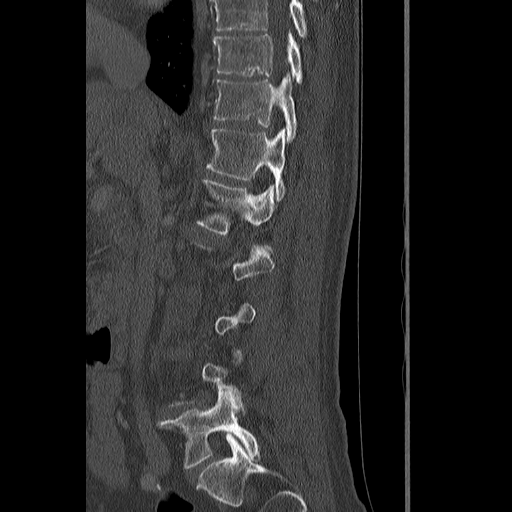
[im 28/66  bone]
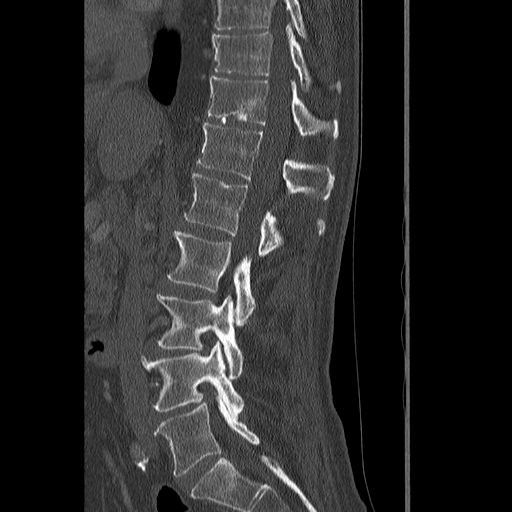
[im 33/66  soft-tissue]
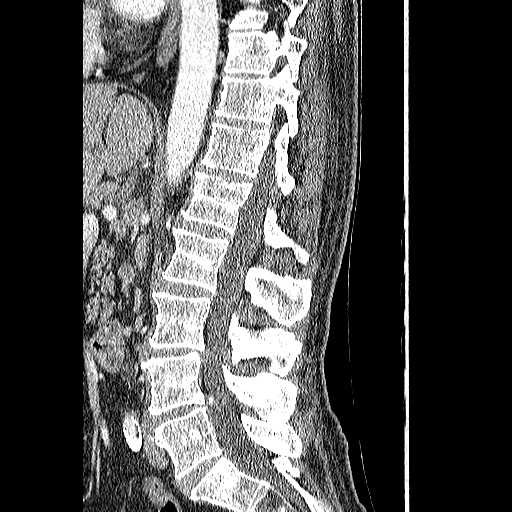
[im 33/66  bone]
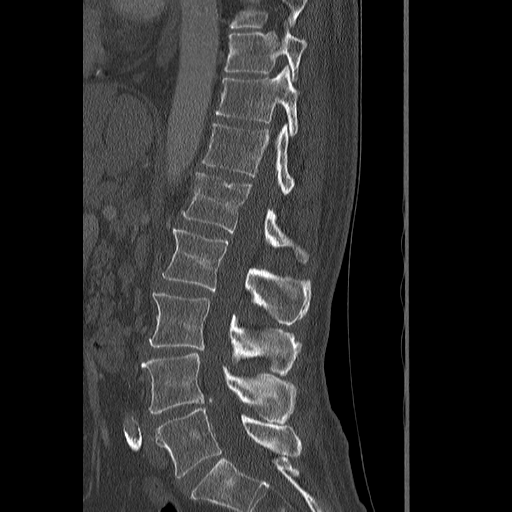
[im 38/66  bone]
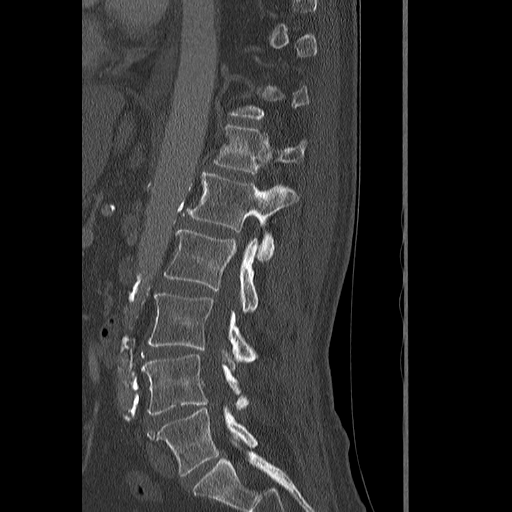
[im 44/66  bone]
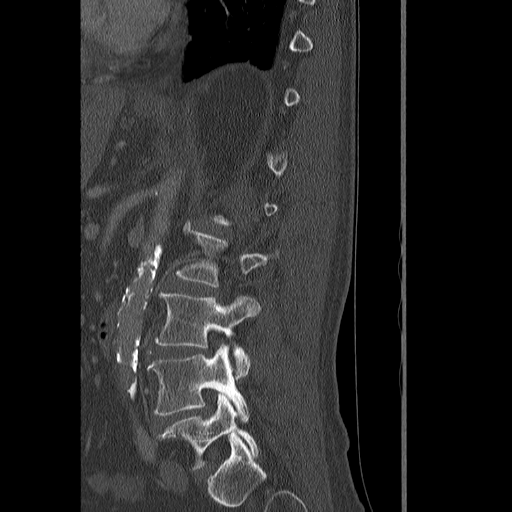

[Series 504: <mpr thick range(2)> · axial · 0.60mm/px · z∈[+1371,+1537]mm · 4 of 139 slices shown]
[im 28/139  bone]
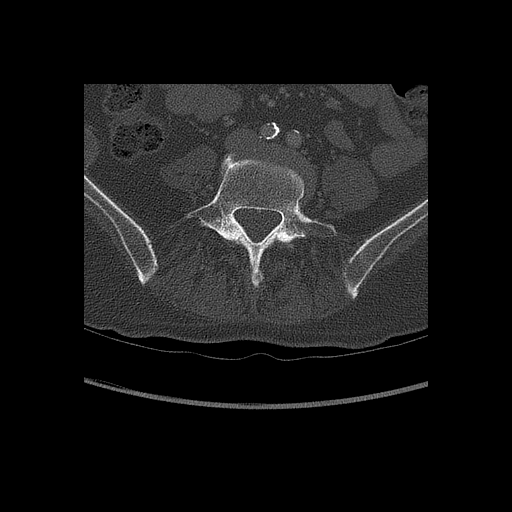
[im 56/139  bone]
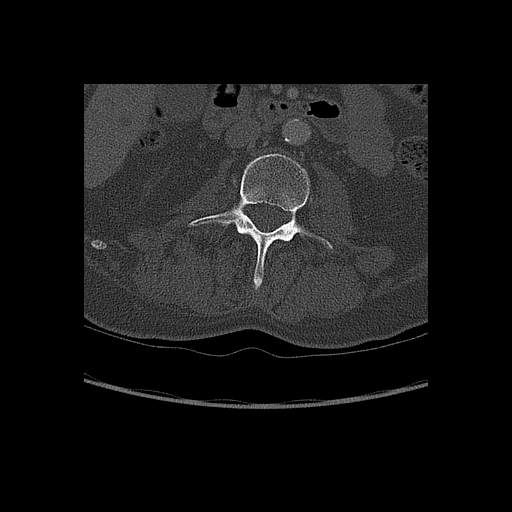
[im 83/139  bone]
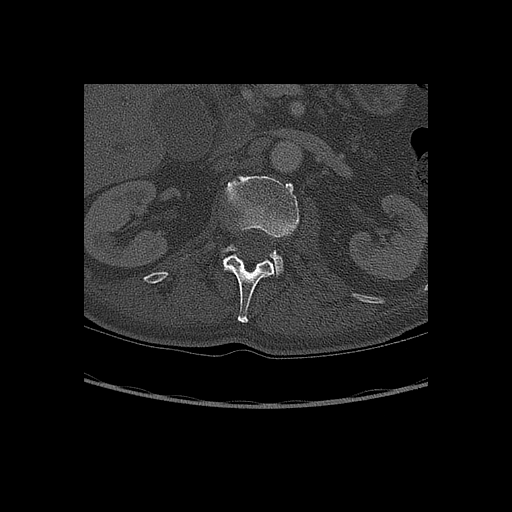
[im 111/139  bone]
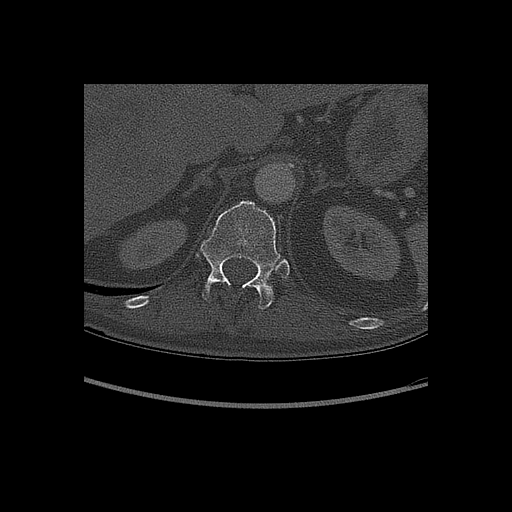

[16 of 33 positions shown; findings below may reference images not displayed]

FINDINGS: Segmentation: 5 lumbar type vertebrae.

Alignment: Unchanged levoscoliosis. Unchanged mild retrolisthesis at
L3-L4.

Vertebrae: Small lytic lesions in the T11 and L4 vertebral bodies.
No acute fracture.

Paraspinal and other soft tissues: Please see separate CT abdomen
pelvis report from same day. Incompletely visualized diffuse hepatic
metastatic disease. Portacaval and aortocaval lymphadenopathy.
Unchanged lytic lesion in the left iliac bone. Aortoiliac
atherosclerotic vascular disease.

Disc levels: Multilevel degenerative changes are unchanged since
lumbar myelogram 5 days ago, including a right extraforaminal disc
protrusion at L4-L5 closely approximating and potentially irritating
the right L4 nerve root.
IMPRESSION: 1. No acute osseous abnormality.
2. Unchanged bony metastases in the T11 and L4 vertebral bodies and
left iliac bone.
3. Incompletely visualized intra-abdominal metastatic disease
involving the liver and upper abdominal lymph nodes.
4. Aortic Atherosclerosis ([T1]-[T1]).

## 2021-08-25 IMAGING — CT CT ABD-PELV W/ CM
2 of 5 series · 13 of 46 positions shown, 15 images · IV contrast (OMNIPAQUE 300)
Comparison: CTA chest, abdomen and pelvis [DATE] and CT of the
chest, abdomen and pelvis on [DATE] and PET/CT [DATE].
Thoracic and lumbar MRI [DATE]. Abdominal ultrasound [DATE].

CLINICAL DATA: 60-year-old with acute abdominal pain. History of
metastatic lung cancer.

EXAM:
CT ABDOMEN AND PELVIS WITH CONTRAST
TECHNIQUE: Multidetector CT imaging of the abdomen and pelvis was performed
using the standard protocol following bolus administration of
intravenous contrast.

[Series 2: axial st · axial · 0.75mm/px · z∈[+1196,+1592]mm · 10 of 93 slices shown, 12 images]
[im 7/93  soft-tissue]
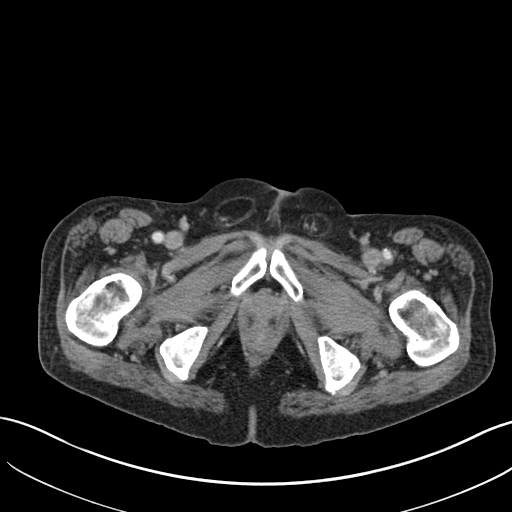
[im 7/93  bone]
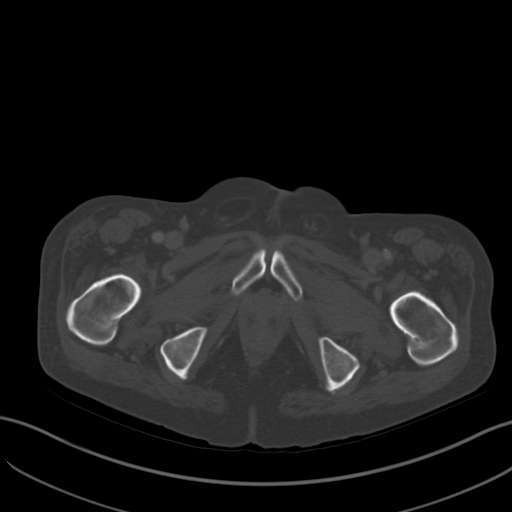
[im 19/93  soft-tissue]
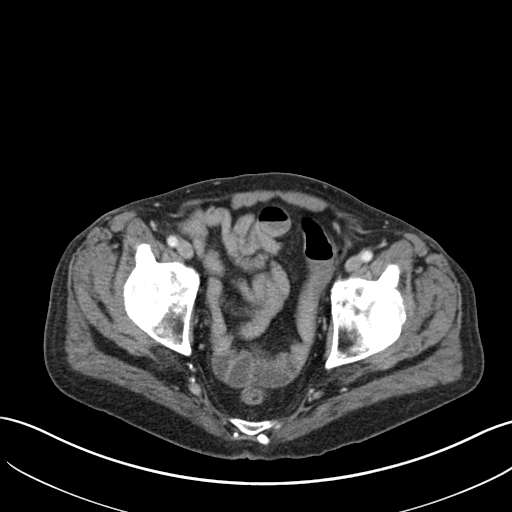
[im 25/93  soft-tissue]
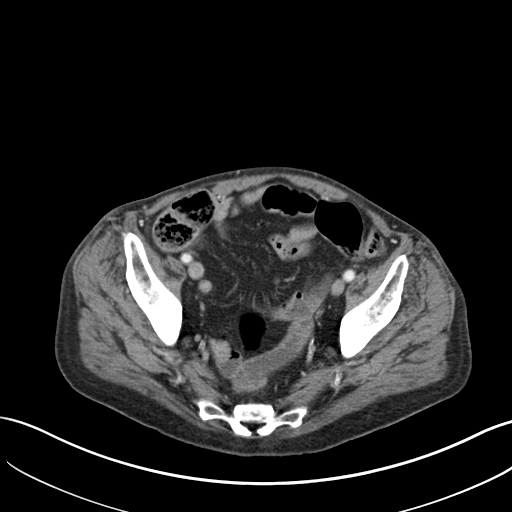
[im 31/93  soft-tissue]
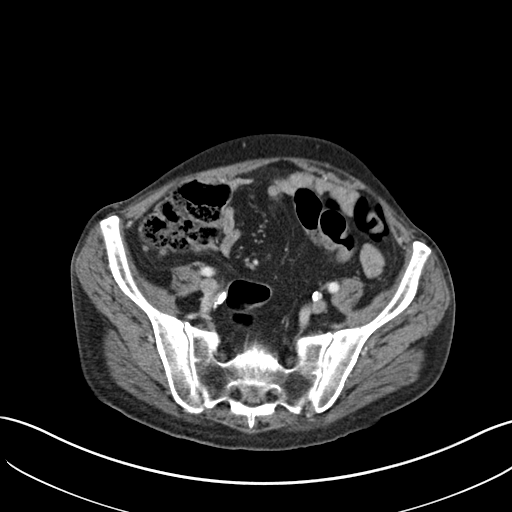
[im 43/93  soft-tissue]
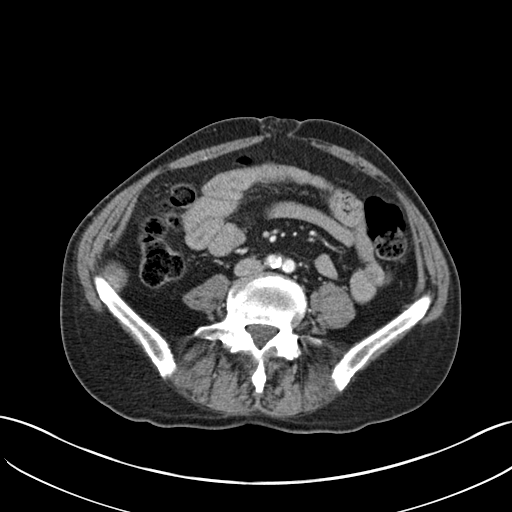
[im 50/93  soft-tissue]
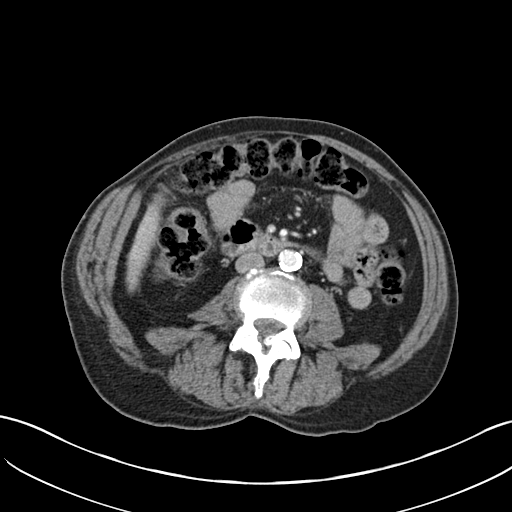
[im 62/93  soft-tissue]
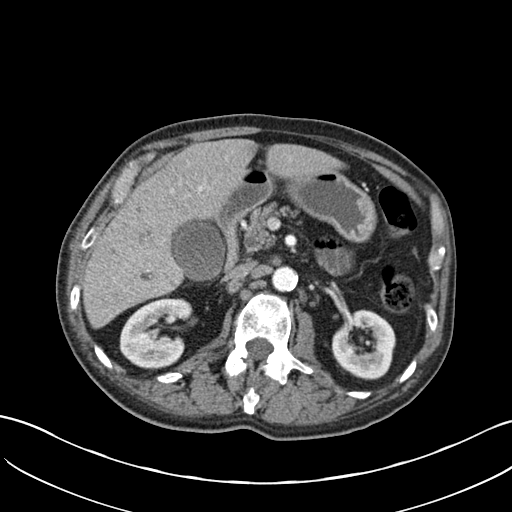
[im 68/93  soft-tissue]
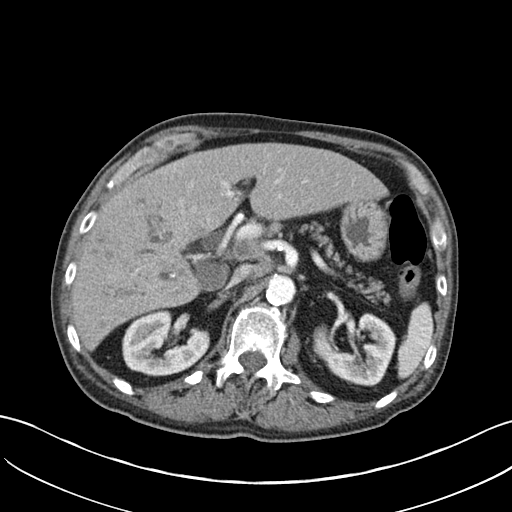
[im 74/93  soft-tissue]
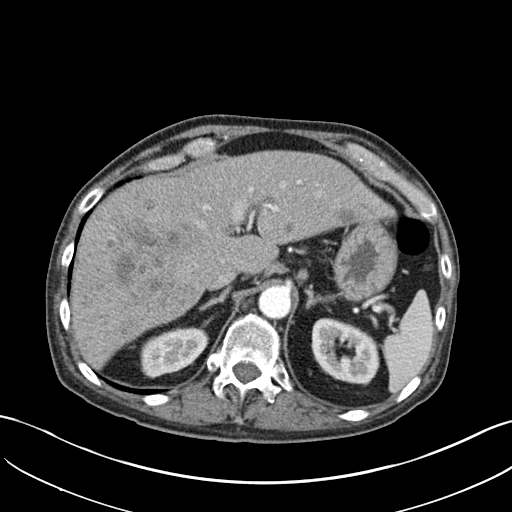
[im 74/93  bone]
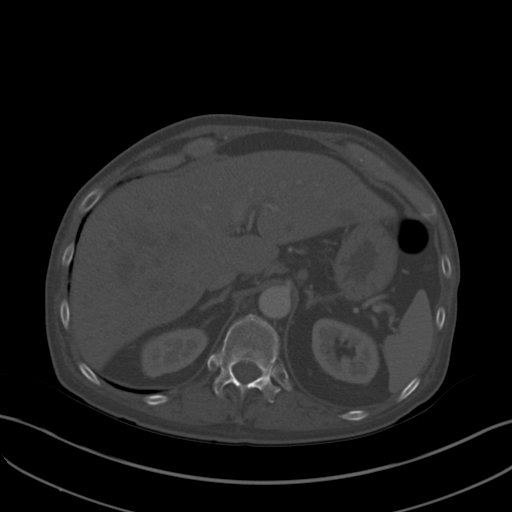
[im 86/93  soft-tissue]
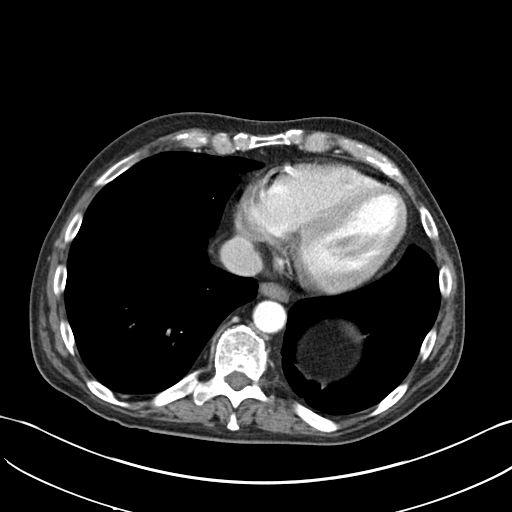

[Series 4: coronal st · coronal · 0.74mm/px · 3 of 144 slices shown]
[im 48/144  soft-tissue]
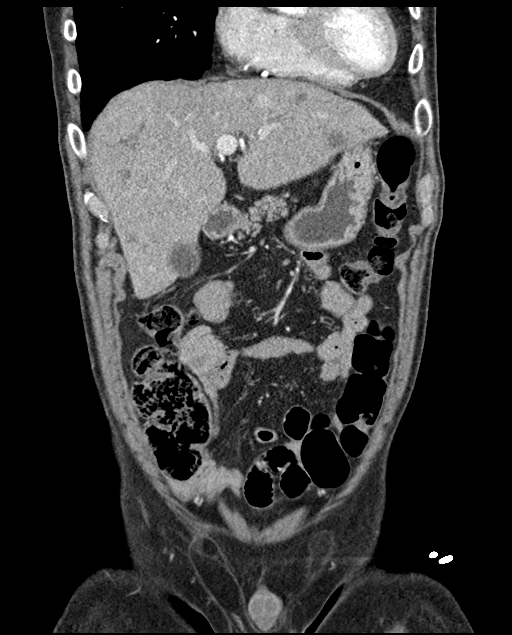
[im 64/144  soft-tissue]
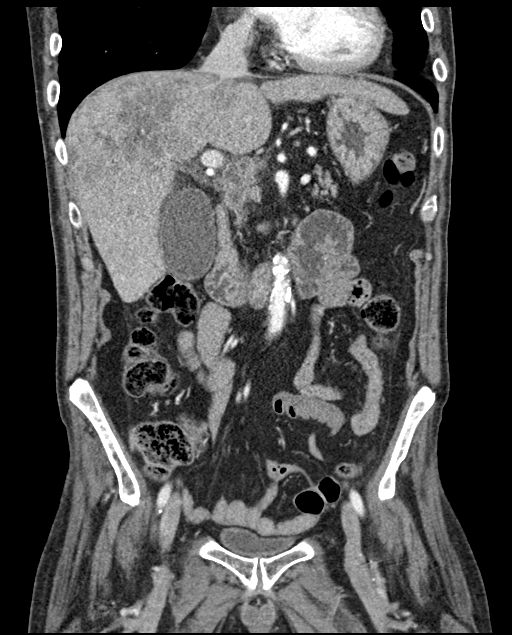
[im 80/144  soft-tissue]
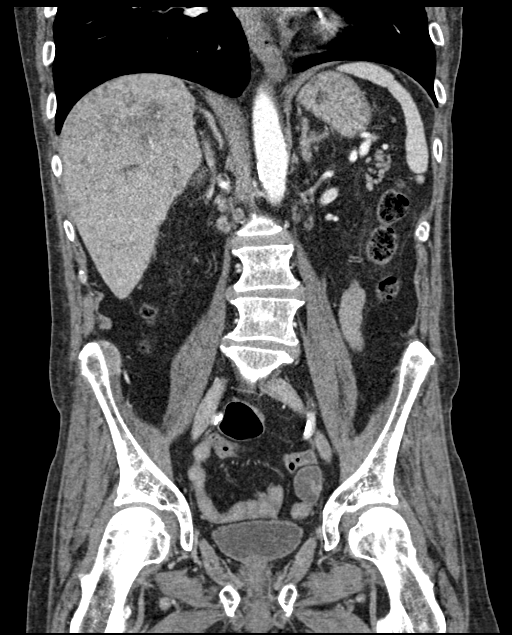

[13 of 46 positions shown; findings below may reference images not displayed]

RADIATION DOSE REDUCTION: This exam was performed according to the
departmental dose-optimization program which includes automated
exposure control, adjustment of the mA and/or kV according to
patient size and/or use of iterative reconstruction technique.

CONTRAST:  100mL OMNIPAQUE IOHEXOL 300 MG/ML  SOLN
FINDINGS: Lower chest: Again noted are small pulmonary nodules in the right
lower lobe. There is an dominant nodule in the right lower lobe that
measures approximately 6 mm minimally changed since [DATE].
However, there is a new punctate nodule in the medial right lower
lobe on sequence 6, image 25. In addition, there appears to be a new
3 mm nodule in the right lower lobe on sequence 6 image 7. New
punctate nodule in the right middle lobe on image 22. Findings are
suggestive for small lung metastases.

Hepatobiliary: There are innumerable low-density lesions scattered
throughout the liver of which are new and suggestive for hepatic
metastatic disease. In addition, there is diffuse narrowing of the
main right portal vein and evidence for thrombus involving right
intrahepatic portal venous branches. The main and left intrahepatic
portal veins appear to be patent. Poorly defined low-density
structures throughout the right hepatic lobe are most with
metastatic disease, most prominent in segment 8. Gallbladder is
mildly distended. Mild pericholecystic fluid associated with the
gallbladder. Difficult to exclude intrahepatic biliary dilatation in
the right hepatic lobe.

Pancreas: Unremarkable. No pancreatic ductal dilatation or
surrounding inflammatory changes.

Spleen: There are least 2 small low-density structures in the spleen
that are stable. Spleen is normal in size.

Adrenals/Urinary Tract: Normal adrenal glands. Normal appearance of
both kidneys without hydronephrosis. No suspicious renal lesions. No
definite kidney stones. Normal appearance of the urinary bladder.

Stomach/Bowel: Stomach is within normal limits. Appendix appears
normal. No evidence of bowel wall thickening, distention, or
inflammatory changes.

Vascular/Lymphatic: Atherosclerotic disease in the abdominal aorta
without aneurysm. Main visceral arteries are patent. Enlarged
low-density lymph node in the upper abdominal pre caval region on
sequence 2 image 29. This precaval lymph node measures 1.5 cm in
short axis and previously measured 1.3 cm on [DATE]. Enlarged
retroperitoneal lymph nodes around the IVC. There is another index
lymph node between the IVC and the aorta on image 34 that measures
1.0 cm in short axis. Thrombosis of right portal vein branches.
Narrowing of the main right portal vein appears new. Main portal
vein and portal vein confluence appear to be patent although the
main portal vein is adjacent to the enlarged precaval lymph node.

Reproductive: Prostate is unremarkable.

Other: No significant ascites other than the minimal fluid around
the gallbladder. Umbilical hernia containing fat. Bilateral inguinal
hernias containing fat.

Musculoskeletal: Expansile lytic lesion involving the anterior
aspect of the right iliac wing that measures 3.4 x 3.1 cm and
markedly enlarged since [DATE]. Again noted is lucency along the
anterior L4 vertebral body and suggestive for a lucent bone lesion.
There is also a lesion along the posteroinferior aspect of the T11
vertebral body suggestive for bone metastasis. Again noted is a
lucent lesion involving the left ilium on sequence 2 image 54.
IMPRESSION: 1. Imaging findings are compatible with progression of metastatic
disease. New hepatic metastasis with new small pulmonary nodules,
enlarged upper abdominal lymph nodes and progression of bone
disease.
2. Significant change in the appearance of the liver with
innumerable hepatic lesions. The disease is most prominent in the
right hepatic lobe and there is evidence for thrombosed right portal
vein branches. The main right portal vein is also narrowed compared
to the previous examination.
3. Small amount of pericholecystic fluid is nonspecific. Gallbladder
is mildly distended. Findings are similar to the recent ultrasound
findings. These findings could be associated with the hepatic
disease. Ultrasound and CT findings are equivocal for cholecystitis
and consider further evaluation of the gallbladder with a HIDA scan
(if clinically indicated).
4. Scattered bony lesions compatible with metastatic disease.
Enlargement of a lytic expansile lesion involving the right iliac
wing.
5. Umbilical hernia.  Bilateral inguinal hernias.
6.  Aortic Atherosclerosis ([6C]-[6C]).

These results were called by telephone at the time of interpretation
on [DATE] at [DATE] to provider SHOBY , who verbally
acknowledged these results.

## 2021-08-25 MED ORDER — INSULIN ASPART 100 UNIT/ML IJ SOLN
0.0000 [IU] | Freq: Three times a day (TID) | INTRAMUSCULAR | Status: DC
  Administered 2021-08-26 (×2): 1 [IU] via SUBCUTANEOUS
  Administered 2021-08-27: 2 [IU] via SUBCUTANEOUS

## 2021-08-25 MED ORDER — METOCLOPRAMIDE HCL 5 MG/ML IJ SOLN
5.0000 mg | Freq: Four times a day (QID) | INTRAMUSCULAR | Status: DC
  Administered 2021-08-25 – 2021-09-01 (×21): 5 mg via INTRAVENOUS
  Filled 2021-08-25 (×24): qty 2

## 2021-08-25 MED ORDER — IOHEXOL 300 MG/ML  SOLN
100.0000 mL | Freq: Once | INTRAMUSCULAR | Status: AC | PRN
  Administered 2021-08-25: 100 mL via INTRAVENOUS

## 2021-08-25 MED ORDER — HYDROMORPHONE HCL 1 MG/ML IJ SOLN
1.0000 mg | Freq: Once | INTRAMUSCULAR | Status: AC
  Administered 2021-08-25: 1 mg via INTRAVENOUS
  Filled 2021-08-25: qty 1

## 2021-08-25 MED ORDER — ONDANSETRON HCL 4 MG/2ML IJ SOLN
4.0000 mg | Freq: Once | INTRAMUSCULAR | Status: AC
  Administered 2021-08-25: 4 mg via INTRAVENOUS
  Filled 2021-08-25: qty 2

## 2021-08-25 MED ORDER — SODIUM CHLORIDE (PF) 0.9 % IJ SOLN
INTRAMUSCULAR | Status: AC
  Filled 2021-08-25: qty 50

## 2021-08-25 MED ORDER — MOMETASONE FURO-FORMOTEROL FUM 200-5 MCG/ACT IN AERO
2.0000 | INHALATION_SPRAY | Freq: Two times a day (BID) | RESPIRATORY_TRACT | Status: DC
  Administered 2021-08-26 – 2021-09-03 (×15): 2 via RESPIRATORY_TRACT
  Filled 2021-08-25: qty 8.8

## 2021-08-25 MED ORDER — SODIUM CHLORIDE 0.9 % IV SOLN: INTRAVENOUS | Status: DC

## 2021-08-25 MED ORDER — MULTIVITAMINS PO CAPS: 1.0000 | ORAL_CAPSULE | Freq: Every day | ORAL | Status: DC

## 2021-08-25 MED ORDER — KETOROLAC TROMETHAMINE 30 MG/ML IJ SOLN
15.0000 mg | Freq: Once | INTRAMUSCULAR | Status: AC
  Administered 2021-08-25: 15 mg via INTRAVENOUS
  Filled 2021-08-25: qty 1

## 2021-08-25 MED ORDER — HYDROMORPHONE HCL 1 MG/ML IJ SOLN
2.0000 mg | INTRAMUSCULAR | Status: AC | PRN
  Administered 2021-08-25 (×2): 2 mg via INTRAVENOUS
  Filled 2021-08-25 (×2): qty 2

## 2021-08-25 MED ORDER — PIPERACILLIN-TAZOBACTAM 3.375 G IVPB
3.3750 g | Freq: Three times a day (TID) | INTRAVENOUS | Status: DC
  Filled 2021-08-25: qty 50

## 2021-08-25 MED ORDER — METHADONE HCL 10 MG PO TABS
10.0000 mg | ORAL_TABLET | Freq: Three times a day (TID) | ORAL | Status: DC
  Administered 2021-08-25 – 2021-08-31 (×13): 10 mg via ORAL
  Filled 2021-08-25 (×15): qty 1

## 2021-08-25 MED ORDER — OXYCODONE HCL 5 MG PO TABS
5.0000 mg | ORAL_TABLET | Freq: Four times a day (QID) | ORAL | Status: AC | PRN
  Administered 2021-08-25 – 2021-08-26 (×2): 5 mg via ORAL
  Filled 2021-08-25 (×2): qty 1

## 2021-08-25 MED ORDER — MONTELUKAST SODIUM 10 MG PO TABS
10.0000 mg | ORAL_TABLET | Freq: Every day | ORAL | Status: DC
  Administered 2021-08-26 – 2021-09-02 (×7): 10 mg via ORAL
  Filled 2021-08-25 (×8): qty 1

## 2021-08-25 MED ORDER — LORAZEPAM 2 MG/ML IJ SOLN
0.5000 mg | INTRAMUSCULAR | Status: AC
  Administered 2021-08-25: 0.5 mg via INTRAVENOUS
  Filled 2021-08-25: qty 1

## 2021-08-25 MED ORDER — OXYCODONE HCL 5 MG PO TABS
5.0000 mg | ORAL_TABLET | Freq: Four times a day (QID) | ORAL | Status: DC | PRN
  Administered 2021-08-25: 5 mg via ORAL
  Filled 2021-08-25: qty 1

## 2021-08-25 MED ORDER — HEPARIN BOLUS VIA INFUSION
2300.0000 [IU] | Freq: Once | INTRAVENOUS | Status: AC
  Administered 2021-08-25: 2300 [IU] via INTRAVENOUS
  Filled 2021-08-25: qty 2300

## 2021-08-25 MED ORDER — LIDOCAINE 5 % EX PTCH
1.0000 | MEDICATED_PATCH | Freq: Once | CUTANEOUS | Status: AC
  Administered 2021-08-25: 1 via TRANSDERMAL
  Filled 2021-08-25: qty 1

## 2021-08-25 MED ORDER — OXYCODONE-ACETAMINOPHEN 5-325 MG PO TABS
1.0000 | ORAL_TABLET | Freq: Four times a day (QID) | ORAL | Status: DC | PRN
  Administered 2021-08-25: 1 via ORAL
  Filled 2021-08-25: qty 1

## 2021-08-25 MED ORDER — CYCLOBENZAPRINE HCL 5 MG PO TABS
5.0000 mg | ORAL_TABLET | Freq: Three times a day (TID) | ORAL | Status: DC
  Administered 2021-08-25 – 2021-08-30 (×12): 5 mg via ORAL
  Filled 2021-08-25 (×15): qty 1

## 2021-08-25 MED ORDER — HYDROMORPHONE HCL 1 MG/ML IJ SOLN
1.0000 mg | INTRAMUSCULAR | Status: DC | PRN
  Administered 2021-08-25 (×2): 1 mg via INTRAVENOUS
  Filled 2021-08-25 (×2): qty 1

## 2021-08-25 MED ORDER — SODIUM CHLORIDE 0.9 % IV SOLN: INTRAVENOUS | Status: AC

## 2021-08-25 MED ORDER — SODIUM CHLORIDE 0.9 % IV BOLUS
1000.0000 mL | Freq: Once | INTRAVENOUS | Status: AC
  Administered 2021-08-25: 1000 mL via INTRAVENOUS

## 2021-08-25 MED ORDER — ONDANSETRON HCL 4 MG/2ML IJ SOLN
INTRAMUSCULAR | Status: AC
  Filled 2021-08-25: qty 2

## 2021-08-25 MED ORDER — LORAZEPAM 2 MG/ML IJ SOLN
1.0000 mg | Freq: Once | INTRAMUSCULAR | Status: AC
  Administered 2021-08-25: 1 mg via INTRAVENOUS
  Filled 2021-08-25: qty 1

## 2021-08-25 MED ORDER — HEPARIN (PORCINE) 25000 UT/250ML-% IV SOLN
1750.0000 [IU]/h | INTRAVENOUS | Status: DC
  Administered 2021-08-25: 1350 [IU]/h via INTRAVENOUS
  Administered 2021-08-26: 1500 [IU]/h via INTRAVENOUS
  Administered 2021-08-26: 1400 [IU]/h via INTRAVENOUS
  Administered 2021-08-27 – 2021-08-29 (×3): 1500 [IU]/h via INTRAVENOUS
  Administered 2021-08-29: 1600 [IU]/h via INTRAVENOUS
  Administered 2021-08-30: 1650 [IU]/h via INTRAVENOUS
  Filled 2021-08-25 (×10): qty 250

## 2021-08-25 MED ORDER — DEXAMETHASONE SODIUM PHOSPHATE 10 MG/ML IJ SOLN
8.0000 mg | INTRAMUSCULAR | Status: DC
  Administered 2021-08-25 – 2021-08-30 (×6): 8 mg via INTRAVENOUS
  Filled 2021-08-25 (×6): qty 1

## 2021-08-25 MED ORDER — ALBUTEROL SULFATE HFA 108 (90 BASE) MCG/ACT IN AERS: 2.0000 | INHALATION_SPRAY | RESPIRATORY_TRACT | Status: DC | PRN

## 2021-08-25 MED ORDER — ALBUTEROL SULFATE (2.5 MG/3ML) 0.083% IN NEBU: 2.5000 mg | INHALATION_SOLUTION | RESPIRATORY_TRACT | Status: DC | PRN

## 2021-08-25 MED ORDER — GADOBUTROL 1 MMOL/ML IV SOLN
7.0000 mL | Freq: Once | INTRAVENOUS | Status: AC | PRN
  Administered 2021-08-25: 7 mL via INTRAVENOUS

## 2021-08-25 MED ORDER — KETAMINE HCL 50 MG/5ML IJ SOSY: 0.3000 mg/kg | PREFILLED_SYRINGE | Freq: Once | INTRAMUSCULAR | Status: DC

## 2021-08-25 MED ORDER — ONDANSETRON HCL 4 MG/2ML IJ SOLN
4.0000 mg | Freq: Four times a day (QID) | INTRAMUSCULAR | Status: DC | PRN
  Administered 2021-08-25 – 2021-09-03 (×11): 4 mg via INTRAVENOUS
  Filled 2021-08-25 (×10): qty 2

## 2021-08-25 MED ORDER — HYDROMORPHONE HCL 1 MG/ML IJ SOLN: 2.0000 mg | INTRAMUSCULAR | Status: DC | PRN

## 2021-08-25 NOTE — ED Provider Notes (Signed)
[facility] DEPT Provider Note   CSN: [id number] Arrival date & time: 08/25/21  0735     History  Chief Complaint  Patient presents with   Back Pain   Abdominal Pain   Leg Pain    Kenzo Ozment is a 61 y.o. male.  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  States he was admitted to [facility] several weeks ago for pain control and he is not improving.  His pain is not controlled at home with his home Percocet.  He feels he is getting weaker not able to walk and needing a wheelchair.  Has had increased abdominal pain which she thinks is radiating from his back and nausea but no vomiting.  He has been constipated with last bowel movement 2 days ago.  No fever.  No fall or trauma.  No bowel or bladder incontinence.  No vomiting.  Does feel he is having increased weakness and pain to his right leg.  No numbness or tingling.  Unable to walk and needing a wheelchair over the past several days and feels like he has pain is not improving ever since he left [facility] 2 weeks ago.  He has undergone multiple scans including CT myelogram recently.  No plans for surgical intervention at this time but did receive epidural injections  Metastatic NSCLC s/p LUL lobectomy, s/p chemoradiation, c/b with mets to brain s/p L frontal & occipital-parietal craniotomy, with s/p and mets to lumbar/sacral spine, Chronic - MRI T/L spine 5/12 (careeverywhere): multple osseous metastases involving L4 and S2 vertebral bodies as well as left sacrum. Also noted L3-L4 broad disc bulging with superimposed R subarticular/foraminal and left foraminal disc protrusion. Resulting mild bilateral foraminal stenosis, mild canal senosis, and R > L subarticular reces narrowing.    The history is provided by the patient and a relative.  Back Pain Associated symptoms: abdominal pain and leg pain   Associated symptoms: no  chest pain, no dysuria, no fever, no headaches and no weakness   Abdominal Pain Associated symptoms: constipation and nausea   Associated symptoms: no chest pain, no cough, no dysuria, no fatigue, no fever, no hematuria, no shortness of breath and no vomiting   Leg Pain Associated symptoms: back pain   Associated symptoms: no fatigue and no fever       Home Medications Prior to Admission medications   Medication Sig Start Date End Date Taking? Authorizing Provider  ACCU-CHEK GUIDE test strip  11/20/20   [provider]  albuterol (PROVENTIL) (2.5 MG/3ML) 0.083% nebulizer solution Take 2.5 mg by nebulization every 6 (six) hours as needed for shortness of breath or wheezing. 01/25/21   [provider]  albuterol (VENTOLIN HFA) 108 (90 Base) MCG/ACT inhaler Inhale 2 puffs into the lungs every 4 (four) hours as needed for wheezing or shortness of breath. 07/14/21   Pickenpack-Cousar, Carlena Sax, NP  aspirin EC 81 MG tablet Take 81 mg by mouth daily.    [provider]  Blood Glucose Monitoring Suppl (ACCU-CHEK GUIDE) w/Device KIT USE AS DIRECTED TO CHECK BLOOD SUGAR Patient not taking: Reported on 07/26/2021 11/20/20   [provider]  Blood Glucose Monitoring Suppl (GLUCOCOM BLOOD GLUCOSE MONITOR) DEVI Use to check blood sugar daily. Patient not taking: Reported on 07/26/2021 11/20/20   [provider]  budesonide-formoterol (SYMBICORT) 160-4.5 MCG/ACT inhaler Inhale 2 puffs into the lungs 2 (two) times daily. 07/14/21  Pickenpack-Cousar, Carlena Sax, NP  dexamethasone (DECADRON) 4 MG tablet Take 2 tablets (8 mg total) by mouth 2 (two) times daily. 08/13/21   [name], MD  dronabinol (MARINOL) 2.5 MG capsule Take 1 capsule (2.5 mg total) by mouth 3 (three) times daily before meals. 08/13/21   [name], MD  escitalopram (LEXAPRO) 5 MG tablet Take 5 mg by mouth daily.    [provider]  fluocinolone (VANOS) 0.01 % cream Apply 1 application.  topically daily as needed (rash). Patient not taking: Reported on 08/13/2021 03/18/20   [provider]  ketorolac (TORADOL) 10 MG tablet Take 1 tablet (10 mg total) by mouth every 8 (eight) hours as needed. 08/12/21   Pickenpack-Cousar, Carlena Sax, NP  LORazepam (ATIVAN) 1 MG tablet Take 1 tablet (1 mg total) by mouth every 6 (six) hours as needed for anxiety. 08/20/21   [name], MD  metFORMIN (GLUCOPHAGE) 500 MG tablet Take 500 mg by mouth daily.    [provider]  methadone (DOLOPHINE) 10 MG tablet Take 1 tablet (10 mg total) by mouth every 8 (eight) hours. 08/12/21   Pickenpack-Cousar, Carlena Sax, NP  methocarbamol (ROBAXIN) 500 MG tablet Take 1 tablet (500 mg total) by mouth every 8 (eight) hours as needed for muscle spasms. Patient not taking: Reported on 08/13/2021 07/14/21   Pickenpack-Cousar, Carlena Sax, NP  montelukast (SINGULAIR) 10 MG tablet Take 10 mg by mouth at bedtime.    [provider]  Multiple Vitamin (MULTIVITAMIN) capsule Take 1 capsule by mouth daily.    [provider]  naloxone [facility]) nasal spray 4 mg/0.1 mL Place into the nose. Patient not taking: Reported on 08/13/2021 12/05/20   [provider]  ondansetron (ZOFRAN-ODT) 8 MG disintegrating tablet Take 1 tablet (8 mg total) by mouth every 8 (eight) hours as needed for nausea or vomiting. 08/23/21   [name], MD  oxyCODONE-acetaminophen (PERCOCET) 10-325 MG tablet Take 1 tablet by mouth every 4 (four) hours as needed for pain. 08/19/21   [name], MD  polyethylene glycol (MIRALAX / GLYCOLAX) 17 g packet Take 17 g by mouth daily. Patient not taking: Reported on 08/13/2021    [provider]  promethazine (PHENERGAN) 25 MG suppository Place 1 suppository (25 mg total) rectally every 6 (six) hours as needed for nausea or vomiting. 08/20/21   [name], MD  senna-docusate (SENOKOT-S) 8.6-50 MG tablet Take 2 tablets by mouth 2 (two) times daily. 07/26/21   [name], MD      Allergies    Gabapentin, Levetiracetam, Pregabalin, Bupropion, Duloxetine, Ezetimibe, Other, Prochlorperazine, and Lamotrigine    Review of Systems   Review of Systems  Constitutional:  Positive for activity change and appetite change. Negative for fatigue and fever.  HENT:  Negative for congestion and rhinorrhea.   Respiratory:  Negative for cough, chest tightness and shortness of breath.   Cardiovascular:  Negative for chest pain.  Gastrointestinal:  Positive for abdominal pain, constipation and nausea. Negative for vomiting.  Genitourinary:  Negative for dysuria and hematuria.  Musculoskeletal:  Positive for back pain and gait problem.  Skin:  Negative for rash.  Neurological:  Negative for dizziness, weakness and headaches.   all other systems are negative except as noted in the HPI and PMH.   Physical Exam Updated Vital Signs BP 127/88 (BP Location: Right Arm)   Pulse (!) 106   Temp 97.7 F (36.5 C) (Oral)   Resp 16  Ht 6' (1.829 m)   SpO2 95%   BMI 21.56 kg/m  Physical Exam Vitals and nursing note reviewed.  Constitutional:      General: He is not in acute distress.    Appearance: He is well-developed.     Comments: Chronically ill-appearing  HENT:     Head: Normocephalic and atraumatic.     Mouth/Throat:     Pharynx: No oropharyngeal exudate.  Eyes:     Conjunctiva/sclera: Conjunctivae normal.     Pupils: Pupils are equal, round, and reactive to light.  Neck:     Comments: No meningismus. Cardiovascular:     Rate and Rhythm: Normal rate and regular rhythm.     Heart sounds: Normal heart sounds. No murmur heard. Pulmonary:     Effort: Pulmonary effort is normal. No respiratory distress.     Breath sounds: Normal breath sounds.  Chest:     Chest wall: No tenderness.  Abdominal:     Palpations: Abdomen is soft.     Tenderness: There is abdominal tenderness. There is no guarding or rebound.     Comments: Diffusely tender, no guarding or  rebound  Musculoskeletal:        General: Tenderness present. Normal range of motion.     Cervical back: Normal range of motion and neck supple.     Comments: Diffuse paraspinal lumbar tenderness, no midline tenderness  5/5 strength in bilateral lower extremities. Ankle plantar and dorsiflexion intact. Great toe extension intact bilaterally. +2 DP and PT pulses. +2 patellar reflexes bilaterally.  Able to raise legs off the bed bilaterally, somewhat weaker on the right.  Skin:    General: Skin is warm.  Neurological:     Mental Status: He is alert and oriented to person, place, and time.     Cranial Nerves: No cranial nerve deficit.     Motor: No abnormal muscle tone.     Coordination: Coordination normal.     Comments:  5/5 strength throughout. CN 2-12 intact.Equal grip strength.   Psychiatric:        Behavior: Behavior normal.    ED Results / Procedures / Treatments   Labs (all labs ordered are listed, but only abnormal results are displayed) Labs Reviewed  CBC WITH DIFFERENTIAL/PLATELET - Abnormal; Notable for the following components:      Result Value   WBC 12.8 (*)    RBC 3.98 (*)    Hemoglobin 12.0 (*)    HCT 36.3 (*)    Platelets 591 (*)    Neutro Abs 10.4 (*)    All other components within normal limits  COMPREHENSIVE METABOLIC PANEL - Abnormal; Notable for the following components:   Sodium 132 (*)    CO2 20 (*)    Glucose, Bld 153 (*)    Creatinine, Ser 0.49 (*)    Calcium 8.4 (*)    Total Protein 6.4 (*)    Albumin 2.7 (*)    Alkaline Phosphatase 710 (*)    All other components within normal limits  URINALYSIS, ROUTINE W REFLEX MICROSCOPIC - Abnormal; Notable for the following components:   Ketones, ur 20 (*)    All other components within normal limits  APTT - Abnormal; Notable for the following components:   aPTT 38 (*)    All other components within normal limits  PROTIME-INR - Abnormal; Notable for the following components:   Prothrombin Time 18.9 (*)     INR 1.6 (*)    All other components within normal limits  GLUCOSE, CAPILLARY - Abnormal; Notable for the following components:   Glucose-Capillary 107 (*)    All other components within normal limits  CULTURE, BLOOD (ROUTINE X 2)  CULTURE, BLOOD (ROUTINE X 2)  LIPASE, BLOOD  HIV ANTIBODY (ROUTINE TESTING W REFLEX)  HEMOGLOBIN A1C  IRON AND TIBC  HEPARIN LEVEL (UNFRACTIONATED)  COMPREHENSIVE METABOLIC PANEL  CBC    EKG None  Radiology CT ABDOMEN PELVIS W CONTRAST  Result Date: 08/25/2021 CLINICAL DATA:  61 year old with acute abdominal pain. History of metastatic lung cancer. EXAM: CT ABDOMEN AND PELVIS WITH CONTRAST TECHNIQUE: Multidetector CT imaging of the abdomen and pelvis was performed using the standard protocol following bolus administration of intravenous contrast. RADIATION DOSE REDUCTION: This exam was performed according to the departmental dose-optimization program which includes automated exposure control, adjustment of the mA and/or kV according to patient size and/or use of iterative reconstruction technique. CONTRAST:  173m OMNIPAQUE IOHEXOL 300 MG/ML  SOLN COMPARISON:  CTA chest, abdomen and pelvis 05/27/2021 and CT of the chest, abdomen and pelvis on 06/18/2021 and PET/CT 06/22/2021. Thoracic and lumbar MRI 07/30/2021. Abdominal ultrasound 08/24/2021. FINDINGS: Lower chest: Again noted are small pulmonary nodules in the right lower lobe. There is an dominant nodule in the right lower lobe that measures approximately 6 mm minimally changed since 05/27/2021. However, there is a new punctate nodule in the medial right lower lobe on sequence 6, image 25. In addition, there appears to be a new 3 mm nodule in the right lower lobe on sequence 6 image 7. New punctate nodule in the right middle lobe on image 22. Findings are suggestive for small lung metastases. Hepatobiliary: There are innumerable low-density lesions scattered throughout the liver of which are new and suggestive  for hepatic metastatic disease. In addition, there is diffuse narrowing of the main right portal vein and evidence for thrombus involving right intrahepatic portal venous branches. The main and left intrahepatic portal veins appear to be patent. Poorly defined low-density structures throughout the right hepatic lobe are most with metastatic disease, most prominent in segment 8. Gallbladder is mildly distended. Mild pericholecystic fluid associated with the gallbladder. Difficult to exclude intrahepatic biliary dilatation in the right hepatic lobe. Pancreas: Unremarkable. No pancreatic ductal dilatation or surrounding inflammatory changes. Spleen: There are least 2 small low-density structures in the spleen that are stable. Spleen is normal in size. Adrenals/Urinary Tract: Normal adrenal glands. Normal appearance of both kidneys without hydronephrosis. No suspicious renal lesions. No definite kidney stones. Normal appearance of the urinary bladder. Stomach/Bowel: Stomach is within normal limits. Appendix appears normal. No evidence of bowel wall thickening, distention, or inflammatory changes. Vascular/Lymphatic: Atherosclerotic disease in the abdominal aorta without aneurysm. Main visceral arteries are patent. Enlarged low-density lymph node in the upper abdominal pre caval region on sequence 2 image 29. This precaval lymph node measures 1.5 cm in short axis and previously measured 1.3 cm on 06/22/2021. Enlarged retroperitoneal lymph nodes around the IVC. There is another index lymph node between the IVC and the aorta on image 34 that measures 1.0 cm in short axis. Thrombosis of right portal vein branches. Narrowing of the main right portal vein appears new. Main portal vein and portal vein confluence appear to be patent although the main portal vein is adjacent to the enlarged precaval lymph node. Reproductive: Prostate is unremarkable. Other: No significant ascites other than the minimal fluid around the  gallbladder. Umbilical hernia containing fat. Bilateral inguinal hernias containing fat. Musculoskeletal: Expansile lytic lesion involving the anterior aspect of  the right iliac wing that measures 3.4 x 3.1 cm and markedly enlarged since 06/18/2021. Again noted is lucency along the anterior L4 vertebral body and suggestive for a lucent bone lesion. There is also a lesion along the posteroinferior aspect of the T11 vertebral body suggestive for bone metastasis. Again noted is a lucent lesion involving the left ilium on sequence 2 image 54. IMPRESSION: 1. Imaging findings are compatible with progression of metastatic disease. New hepatic metastasis with new small pulmonary nodules, enlarged upper abdominal lymph nodes and progression of bone disease. 2. Significant change in the appearance of the liver with innumerable hepatic lesions. 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 The main right portal vein is also narrowed compared to the previous examination. 3. Small amount of pericholecystic fluid is nonspecific. Gallbladder is mildly distended. Findings are similar to the recent ultrasound findings. These findings could be associated with the hepatic disease. Ultrasound and CT findings are equivocal for cholecystitis and consider further evaluation of the gallbladder with a HIDA scan (if clinically indicated). 4. Scattered bony lesions compatible with metastatic disease. Enlargement of a lytic expansile lesion involving the right iliac wing. 5. Umbilical hernia.  Bilateral inguinal hernias. 6.  Aortic Atherosclerosis (ICD10-I70.0). These results were called by telephone at the time of interpretation on 08/25/2021 at 11:24 am to provider [facility] , who verbally acknowledged these results. Electronically Signed   By: Markus Daft M.D.   On: 08/25/2021 11:24   US Abdomen Limited  Result Date: 08/24/2021 CLINICAL DATA:  Elevated LFTs  with right-sided abdominal pain. History of metastatic lung cancer. EXAM: ULTRASOUND ABDOMEN LIMITED RIGHT UPPER QUADRANT COMPARISON:  PET-CT June 22, 2021 FINDINGS: Gallbladder: Biliary sludge with gallbladder calculi measuring up to 3 mm. Wall thickening measuring up to 8 mm with some pericholecystic fluid. No sonographic Murphy sign noted by sonographer. Common bile duct: Diameter: 9 mm Liver: Heterogeneous appearance of the hepatic parenchyma with multiple small hepatic lesions identified measuring up to 1.6 cm. Portal vein is patent on color Doppler imaging with normal direction of blood flow towards the liver. Other: None. IMPRESSION: 1. Heterogeneous appearance of the hepatic parenchyma with multiple small hepatic lesions identified, suspicious for metastatic disease. 2. Cholelithiasis and sludge with gallbladder wall thickening measuring up to 8 mm but with a negative sonographic Murphy sign. Findings which are favored to reflect sequela of hepatic disease. If continued clinical concern for cholecystitis recommend further evaluation with nuclear medicine HIDA scan. 3. Prominence of the common bile duct measuring 9 mm, recommend correlation with laboratory values to assess for biliary obstruction. Electronically Signed   By: Dahlia Bailiff M.D.   On: 08/24/2021 15:31   CT L-SPINE NO CHARGE  Result Date: 08/25/2021 CLINICAL DATA:  Worsening back and right leg pain. History of metastatic lung cancer. EXAM: CT LUMBAR SPINE WITHOUT CONTRAST TECHNIQUE: Multidetector CT imaging of the lumbar spine was performed without intravenous contrast administration. Multiplanar CT image reconstructions were also generated. RADIATION DOSE REDUCTION: This exam was performed according to the departmental dose-optimization program which includes automated exposure control, adjustment of the mA and/or kV according to patient size and/or use of iterative reconstruction technique. COMPARISON:  CT lumbar myelogram dated August 20, 2021. MRI thoracic and lumbar spine dated Jul 30, 2021. FINDINGS: Segmentation: 5 lumbar type vertebrae. Alignment: Unchanged levoscoliosis. Unchanged mild retrolisthesis at L3-L4. Vertebrae: Small lytic lesions in the T11 and L4 vertebral bodies. No acute fracture.  Paraspinal and other soft tissues: Please see separate CT abdomen pelvis report from same day. Incompletely visualized diffuse hepatic metastatic disease. Portacaval and aortocaval lymphadenopathy. Unchanged lytic lesion in the left iliac bone. Aortoiliac atherosclerotic vascular disease. Disc levels: Multilevel degenerative changes are unchanged since lumbar myelogram 5 days ago, including a right extraforaminal disc protrusion at L4-L5 closely approximating and potentially irritating the right L4 nerve root. IMPRESSION: 1. No acute osseous abnormality. 2. Unchanged bony metastases in the T11 and L4 vertebral bodies and left iliac bone. 3. Incompletely visualized intra-abdominal metastatic disease involving the liver and upper abdominal lymph nodes. 4. Aortic Atherosclerosis (ICD10-I70.0). Electronically Signed   By: Titus Dubin M.D.   On: 08/25/2021 10:56    Procedures Procedures    Medications Ordered in ED Medications  HYDROmorphone (DILAUDID) injection 1 mg (has no administration in time range)  ondansetron (ZOFRAN) injection 4 mg (has no administration in time range)  sodium chloride 0.9 % bolus 1,000 mL (has no administration in time range)    ED Course/ Medical Decision Making/ A&P                           Medical Decision Making Amount and/or Complexity of Data Reviewed Independent Historian: caregiver External Data Reviewed: notes. Labs: ordered. Decision-making details documented in ED Course. Radiology: ordered and independent interpretation performed. Decision-making details documented in ED Course. ECG/medicine tests: ordered and independent interpretation performed. Decision-making details documented in ED  Course.  Risk Prescription drug management. Decision regarding hospitalization.   Patient with metastatic cancer to his brain and spine here with increased back pain, abdominal pain, leg pain and weakness.  Vitals are stable, no fever.  Intact distal pulses, strength and reflexes.  Patient given pain control and IV fluids.  His labs appear to be at baseline with stable hemoglobin and kidney function and LFTs.  Did have gallbladder ultrasound yesterday which showed enlarged gallbladder with wall thickening but no Murphy sign.  Enlarged CBD.  CT scan today discussed with Dr. Anselm Pancoast radiology.  He has extensively progressed metastatic disease involving his liver and spine.  Gallbladder findings equivocal and HIDA scan could be pursued. evidence for thrombosed right portal vein branches  Do not plan anticoagulation at this time given patient's brain metastases. Patient would benefit from admission for pain control as well as further imaging including MRI of his spine as well as HIDA scan. Patient and family in agreement.  Admission d/w Dr. Marylyn Ishihara. MRI of spine pending.         Final Clinical Impression(s) / ED Diagnoses Final diagnoses:  Intractable back pain  Metastatic malignant neoplasm, unspecified site [facility])    Rx / [place] Orders ED Discharge Orders     None         Kijuan Gallicchio, Annie Main, MD 08/25/21 1714

## 2021-08-25 NOTE — Progress Notes (Signed)
ANTICOAGULATION CONSULT NOTE - Initial Consult  Pharmacy Consult for heparin Indication:  VTE treatment  (portal vein thrombosis)  Allergies  Allergen Reactions   Gabapentin Swelling    Leg swelling    Levetiracetam Other (See Comments)    Personality changes   Pregabalin Swelling    Leg swelling    Bupropion Nausea And Vomiting   Duloxetine Other (See Comments)    Excessive sleeping   Ezetimibe Other (See Comments)    Unknown reaction   Other Other (See Comments)    Antibiotics - per wife at bedside, patient will develop C-diff if given "any antibiotic"   Prochlorperazine Nausea And Vomiting   Lamotrigine Itching and Rash    Patient Measurements: Height: 6' (182.9 cm) Weight: 72.1 kg (159 lb) IBW/kg (Calculated) : 77.6 Heparin Dosing Weight: 72.1 kg  Vital Signs: Temp: 97.7 F (36.5 C) (06/07 0741) Temp Source: Oral (06/07 0741) BP: 133/92 (06/07 1156) Pulse Rate: 78 (06/07 1156)  Labs: Recent Labs    08/23/21 1320 08/23/21 1403 08/25/21 0812  HGB 13.2  --  12.0*  HCT 39.0  --  36.3*  PLT 670*  --  591*  CREATININE  --  0.55* 0.49*    Estimated Creatinine Clearance: 102.6 mL/min (A) (by C-G formula based on SCr of 0.49 mg/dL (L)).   Medical History: Past Medical History:  Diagnosis Date   Chronic back pain    Chronic GERD    Chronic pain    COPD (chronic obstructive pulmonary disease) (HCC)    Coronary artery disease    Depression    Diabetes mellitus without complication (HCC)    Gastroparesis    Goals of care, counseling/discussion 01/27/2021   High risk medication use    Hyperlipidemia    MI (myocardial infarction) ([place])    Non-small cell lung cancer metastatic to brain ([place]) 01/27/2021   Restless leg syndrome       Assessment: 61 year old male presented with abdominal pain. Patient with history of NSCLC with mets to brain/liver/spine/hip. 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 Recent craniotomy in April 2023 at [facility];  [facility] spoke with neurosurgery team there who state no contraindication for anticoagulation from their standpoint. Pharmacy consulted to start heparin.  Baseline labs pending. No anticoagulation noted PTA. CBC WNL.  Goal of Therapy:  Heparin level 0.3-0.7 units/ml Monitor platelets by anticoagulation protocol: Yes   Plan:  -Heparin 2300 unit bolus -Start heparin infusion at 1350 units/hr -Check heparin level ~ 6 hours after start of infusion -CBC daily while on heparin infusion -Monitor for s/sx of bleeding  Tawnya Crook, PharmD, BCPS Clinical Pharmacist 08/25/2021 2:19 PM

## 2021-08-25 NOTE — ED Triage Notes (Signed)
Pt reports increased back pain and right leg pain from last visit on 5/14. Pt also endorses abdominal pain and nausea. Hx of non-small cell lung cancer with metastasis.

## 2021-08-25 NOTE — H&P (Addendum)
History and Physical    Patient: Bill Garza KZL:[id number] DOB: 04/24/1960 DOA: 08/25/2021 DOS: the patient was seen and examined on 08/25/2021 PCP: Aleen Campi, NP  Patient coming from: Home  Chief Complaint:  Chief Complaint  Patient presents with   Back Pain   Abdominal Pain   Leg Pain   HPI: Bill Garza is a 61 y.o. 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 Presenting with intractable back pain and abdominal pain. He reports that he's had sharp back pain that has radiated to his chest, belly, and RLE. He was recently admitted to [facility] for intractable pain. Since he's been home, he doesn't feel as if his symptoms hav improved. He is taking two percocet 10s every 4 hours w/o relief. He has had N/V and decreased appetite since he left the hospital 08/11/21. He hasn't had any fevers or sick contacts. He has become progressively more weak and with pain in the RLE. He is having to use a wheelchair over the last few days to help with mobility. When his symptoms did not improve today, he came to the hospital for evaluation. He denies any other aggravating or alleviating factors.   Review of Systems: As mentioned in the history of present illness. All other systems reviewed and are negative. Past Medical History:  Diagnosis Date   Chronic back pain    Chronic GERD    Chronic pain    COPD (chronic obstructive pulmonary disease) (HCC)    Coronary artery disease    Depression    Diabetes mellitus without complication ([place])    Gastroparesis    Goals of care, counseling/discussion 01/27/2021   High risk medication use    Hyperlipidemia    MI (myocardial infarction) ([place])    Non-small cell lung cancer metastatic to brain ([place]) 01/27/2021   Restless leg syndrome    Past Surgical History:  Procedure Laterality Date   BACK SURGERY     CARDIAC CATHETERIZATION     Social History:  reports  that he quit smoking about 13 months ago. His smoking use included cigarettes. He has a 40.00 pack-year smoking history. He has never used smokeless tobacco. He reports that he does not currently use alcohol. He reports that he does not currently use drugs.  Allergies  Allergen Reactions   Gabapentin Swelling    Leg swelling    Levetiracetam Other (See Comments)    Personality changes   Pregabalin Swelling    Leg swelling    Bupropion Nausea And Vomiting   Duloxetine Other (See Comments)    Excessive sleeping   Ezetimibe Other (See Comments)    Unknown reaction   Other Other (See Comments)    Antibiotics - per wife at bedside, patient will develop C-diff if given "any antibiotic"   Prochlorperazine Nausea And Vomiting   Lamotrigine Itching and Rash    History reviewed. No pertinent family history.  Prior to Admission medications   Medication Sig Start Date End Date Taking? Authorizing Provider  ACCU-CHEK GUIDE test strip  11/20/20   [provider]  albuterol (PROVENTIL) (2.5 MG/3ML) 0.083% nebulizer solution Take 2.5 mg by nebulization every 6 (six) hours as needed for shortness of breath or wheezing. 01/25/21   [provider]  albuterol (VENTOLIN HFA) 108 (90 Base) MCG/ACT inhaler Inhale 2 puffs into the lungs every 4 (four) hours as needed for wheezing or shortness of breath. 07/14/21   Pickenpack-Cousar, Carlena Sax, NP  aspirin EC 81 MG tablet Take 81 mg by mouth daily.    [provider]  Blood Glucose Monitoring Suppl (ACCU-CHEK GUIDE) w/Device KIT USE AS DIRECTED TO CHECK BLOOD SUGAR Patient not taking: Reported on 07/26/2021 11/20/20   [provider]  Blood Glucose Monitoring Suppl (GLUCOCOM BLOOD GLUCOSE MONITOR) DEVI Use to check blood sugar daily. Patient not taking: Reported on 07/26/2021 11/20/20   [provider]  budesonide-formoterol (SYMBICORT) 160-4.5 MCG/ACT inhaler Inhale 2 puffs into the lungs 2 (two) times daily. 07/14/21    Pickenpack-Cousar, Carlena Sax, NP  dexamethasone (DECADRON) 4 MG tablet Take 2 tablets (8 mg total) by mouth 2 (two) times daily. 08/13/21   [name], MD  dronabinol (MARINOL) 2.5 MG capsule Take 1 capsule (2.5 mg total) by mouth 3 (three) times daily before meals. 08/13/21   [name], MD  escitalopram (LEXAPRO) 5 MG tablet Take 5 mg by mouth daily.    [provider]  fluocinolone (VANOS) 0.01 % cream Apply 1 application. topically daily as needed (rash). Patient not taking: Reported on 08/13/2021 03/18/20   [provider]  ketorolac (TORADOL) 10 MG tablet Take 1 tablet (10 mg total) by mouth every 8 (eight) hours as needed. 08/12/21   Pickenpack-Cousar, Carlena Sax, NP  LORazepam (ATIVAN) 1 MG tablet Take 1 tablet (1 mg total) by mouth every 6 (six) hours as needed for anxiety. 08/20/21   [name], MD  metFORMIN (GLUCOPHAGE) 500 MG tablet Take 500 mg by mouth daily.    [provider]  methadone (DOLOPHINE) 10 MG tablet Take 1 tablet (10 mg total) by mouth every 8 (eight) hours. 08/12/21   Pickenpack-Cousar, Carlena Sax, NP  methocarbamol (ROBAXIN) 500 MG tablet Take 1 tablet (500 mg total) by mouth every 8 (eight) hours as needed for muscle spasms. Patient not taking: Reported on 08/13/2021 07/14/21   Pickenpack-Cousar, Carlena Sax, NP  montelukast (SINGULAIR) 10 MG tablet Take 10 mg by mouth at bedtime.    [provider]  Multiple Vitamin (MULTIVITAMIN) capsule Take 1 capsule by mouth daily.    [provider]  naloxone [facility]) nasal spray 4 mg/0.1 mL Place into the nose. Patient not taking: Reported on 08/13/2021 12/05/20   [provider]  ondansetron (ZOFRAN-ODT) 8 MG disintegrating tablet Take 1 tablet (8 mg total) by mouth every 8 (eight) hours as needed for nausea or vomiting. 08/23/21   [name], MD  oxyCODONE-acetaminophen (PERCOCET) 10-325 MG tablet Take 1 tablet by mouth every 4 (four) hours as needed for pain. 08/19/21    [name], MD  polyethylene glycol (MIRALAX / GLYCOLAX) 17 g packet Take 17 g by mouth daily. Patient not taking: Reported on 08/13/2021    [provider]  promethazine (PHENERGAN) 25 MG suppository Place 1 suppository (25 mg total) rectally every 6 (six) hours as needed for nausea or vomiting. 08/20/21   [name], MD  senna-docusate (SENOKOT-S) 8.6-50 MG tablet Take 2 tablets by mouth 2 (two) times daily. 07/26/21   [name], MD    Physical Exam: Vitals:   08/25/21 1032 08/25/21 1033 08/25/21 1034 08/25/21 1045  BP:  139/78  (!) 152/95  Pulse: 86 88 85 84  Resp:  16  16  Temp:      TempSrc:      SpO2: 96% 96% 96% 97%  Weight:      Height:       General: 61 y.o. male resting in bed  in NAD Eyes: PERRL, normal sclera ENMT: Nares patent w/o discharge, orophaynx clear, dentition normal, ears w/o discharge/lesions/ulcers Neck: Supple, trachea midline Cardiovascular: RRR, +S1, S2, no m/g/r, equal pulses throughout Respiratory: CTABL, no w/r/r, normal WOB GI: BS+, ND, mild RUQ TTP, no masses noted, no organomegaly noted MSK: No e/c/c; paraspinal pain/spasm Neuro: A&O x 3, no focal deficits Psyc: Appropriate interaction and affect, calm/cooperative  Data Reviewed: Na+  132 CO2  20 Glucose  153 Scr  0.49 Alk phos  710 Lipase  19 AST  24 ALT  25 WBC  12.8 Hgb  12.0 Plt  591  CT ab/pelvis w/ contrast 1. Imaging findings are compatible with progression of metastatic disease. New hepatic metastasis with new small pulmonary nodules, enlarged upper abdominal lymph nodes and progression of bone disease. 2. Significant change in the appearance of the liver with innumerable hepatic lesions. 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 The main right portal vein is also narrowed compared to the previous examination. 3. Small amount of pericholecystic fluid is nonspecific. Gallbladder is  mildly distended. Findings are similar to the recent ultrasound findings. These findings could be associated with the hepatic disease. Ultrasound and CT findings are equivocal for cholecystitis and consider further evaluation of the gallbladder with a HIDA scan (if clinically indicated). 4. Scattered bony lesions compatible with metastatic disease. Enlargement of a lytic expansile lesion involving the right iliac wing. 5. Umbilical hernia.  Bilateral inguinal hernias. 6.  Aortic Atherosclerosis (ICD10-I70.0).  CT L-spine 1. No acute osseous abnormality. 2. Unchanged bony metastases in the T11 and L4 vertebral bodies and left iliac bone. 3. Incompletely visualized intra-abdominal metastatic disease involving the liver and upper abdominal lymph nodes. 4. Aortic Atherosclerosis (ICD10-I70.0).  Assessment and Plan: Intractable Pain secondary to NSCLC     - place in obs, tele     - PRN dilaudid, oxycodone; anti-spasmodics     - consult palliative care for pain management assistance and goals of care     - pain is abdominal and back     - with respect to the abdomen; HIDA scan ordered, spoke with gen surg: callback if HIDA scan positive, reasonable to start empiric abx; plan was to start abx, but family is refusing d/t history of c diff infections     - with respect to back pain; he had a recent myelogram, per family report he was supposed to have neurosurgery follow up but that was pushed back a week; MRI T/L-spine ordered, no focality on exam, sensation intact  NSCLC w/ mets to brain, spine and liver     - s/p left frontal craniotomy, mass resection, and chemo waffer placement (06/2021)     - onco added to team  Elevated alk phos     - secondary to liver mets; follow  Possible cholecystitis SIRS     - see above     - check Bld Cx     - UA ok     - will start zosyn, fluids  Right portal vein thrombosis     - as in CT above     - patient has a recent craniotomy at [facility] on 06/30/21     -  spoke with their neurosurgery team (Dr. Salomon Fick) about anticoagulation and there is no contraindication to [facility] at this point w/ respect to his brain, so we will start anticoargulation w/ heparin.   DM2     -  A1c, SSI, glucose checks     - CLD for now  Normocytic anemia     - no evidence of bleed     - check iron studies  Mild hyponatremia     - fluids, follow  Anxiety/Depression     - continue home regimen when confirmed  Tobacco abuse     - counsel on cessation  COPD     - continue home regimen  Advance Care Planning:   Code Status: FULL  Consults: Palliative Care, Onco; sidebarred to [facility] neurosurgery (Dr. Salomon Fick)  Family Communication: w/ wife at bedside  Severity of Illness: The appropriate patient status for this patient is OBSERVATION. Observation status is judged to be reasonable and necessary in order to provide the required intensity of service to ensure the patient's safety. The patient's presenting symptoms, physical exam findings, and initial radiographic and laboratory data in the context of their medical condition is felt to place them at decreased risk for further clinical deterioration. Furthermore, it is anticipated that the patient will be medically stable for discharge from the hospital within 2 midnights of admission.   Author: Jonnie Finner, DO 08/25/2021 11:39 AM  For on call review [email or web address].

## 2021-08-25 NOTE — ED Notes (Signed)
Secure chat sent to [facility] LPN for SBAR report

## 2021-08-25 NOTE — ED Notes (Signed)
Urinal provided for UA

## 2021-08-25 NOTE — ED Notes (Signed)
Nuclear med called and reports that patient has to be off all pain medication for 6 hours for hepato scan W/ EF. MD notified.

## 2021-08-25 NOTE — Progress Notes (Signed)
ANTICOAGULATION CONSULT NOTE   Pharmacy Consult for heparin Indication:  VTE treatment  (portal vein thrombosis)  Allergies  Allergen Reactions   Gabapentin Swelling    Leg swelling    Levetiracetam Other (See Comments)    Personality changes   Pregabalin Swelling    Leg swelling    Bupropion Nausea And Vomiting   Duloxetine Other (See Comments)    Excessive sleeping   Ezetimibe Other (See Comments)    Unknown reaction   Other Other (See Comments)    Antibiotics - per wife at bedside, patient will develop C-diff if given "any antibiotic"   Prochlorperazine Nausea And Vomiting   Lamotrigine Itching and Rash    Patient Measurements: Height: 6' (182.9 cm) Weight: 72.1 kg (159 lb) IBW/kg (Calculated) : 77.6 Heparin Dosing Weight: 72.1 kg  Vital Signs: Temp: 97.7 F (36.5 C) (06/07 2014) BP: 153/99 (06/07 2014) Pulse Rate: 93 (06/07 2014)  Labs: Recent Labs    08/23/21 1320 08/23/21 1403 08/25/21 0812 08/25/21 0816 08/25/21 1730 08/25/21 2153  HGB 13.2  --  12.0*  --   --   --   HCT 39.0  --  36.3*  --   --   --   PLT 670*  --  591*  --   --   --   APTT  --   --   --  38*  --   --   LABPROT  --   --   --  18.9*  --   --   INR  --   --   --  1.6*  --   --   HEPARINUNFRC  --   --   --   --  0.16* 0.31  CREATININE  --  0.55* 0.49*  --   --   --      Estimated Creatinine Clearance: 102.6 mL/min (A) (by C-G formula based on SCr of 0.49 mg/dL (L)).   Medical History: Past Medical History:  Diagnosis Date   Chronic back pain    Chronic GERD    Chronic pain    COPD (chronic obstructive pulmonary disease) (HCC)    Coronary artery disease    Depression    Diabetes mellitus without complication (HCC)    Gastroparesis    Goals of care, counseling/discussion 01/27/2021   High risk medication use    Hyperlipidemia    MI (myocardial infarction) ([place])    Non-small cell lung cancer metastatic to brain ([place]) 01/27/2021   Restless leg syndrome        Assessment: 61 year old male presented with abdominal pain. Patient with history of NSCLC with mets to brain/liver/spine/hip. 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 Recent craniotomy in April 2023 at [facility]; [facility] spoke with neurosurgery team there who state no contraindication for anticoagulation from their standpoint. Pharmacy consulted to start heparin.  No anticoagulation noted PTA. CBC WNL.  08/25/2021: Initial heparin level 0.31- therapeutic at low end of goal on IV heparin 1350 units/hr No bleeding or infusion related issues reported by RN  Goal of Therapy:  Heparin level 0.3-0.7 units/ml Monitor platelets by anticoagulation protocol: Yes   Plan:  -Increase heparin infusion slightly to 1400 units/hr -Check heparin level ~ 6 hours after rate change -CBC daily while on heparin infusion -Monitor for s/sx of bleeding  Netta Cedars, PharmD, BCPS Clinical Pharmacist 08/25/2021 10:43 PM

## 2021-08-25 NOTE — ED Notes (Signed)
Pt ambulated pt said he is still in some pain but walked fine

## 2021-08-25 NOTE — Progress Notes (Signed)
Pharmacy Antibiotic Note  Bill Garza is a 61 y.o. male admitted on 08/25/2021 with abdominal pain and nausea. Pharmacy has been consulted for Zosyn dosing for intra-abdominal infection.  Plan: Zosyn 3.375 g IV q8h extended infusion  Continue to follow renal function, cultures and clinical progress for antibiotic dosage adjustments and de-escalation as indicated  Height: 6' (182.9 cm) Weight: 72.1 kg (159 lb) IBW/kg (Calculated) : 77.6  Temp (24hrs), Avg:97.7 F (36.5 C), Min:97.7 F (36.5 C), Max:97.7 F (36.5 C)  Recent Labs  Lab 08/23/21 1320 08/23/21 1403 08/25/21 0812  WBC 13.3*  --  12.8*  CREATININE  --  0.55* 0.49*    Estimated Creatinine Clearance: 102.6 mL/min (A) (by C-G formula based on SCr of 0.49 mg/dL (L)).    Allergies  Allergen Reactions   Gabapentin Swelling    Leg swelling    Levetiracetam Other (See Comments)    Personality changes   Pregabalin Swelling    Leg swelling    Bupropion Nausea And Vomiting   Duloxetine Other (See Comments)    Excessive sleeping   Ezetimibe Other (See Comments)    Unknown reaction   Other Other (See Comments)    Antibiotics - per wife at bedside, patient will develop C-diff if given "any antibiotic"   Prochlorperazine Nausea And Vomiting   Lamotrigine Itching and Rash    Antimicrobials this admission: Zosyn 6/7 >>  Microbiology results: N/A  Thank you for allowing pharmacy to be a part of this patient's care.  Tawnya Crook, PharmD, BCPS Clinical Pharmacist 08/25/2021 1:41 PM

## 2021-08-25 NOTE — ED Notes (Signed)
Pt to MRI

## 2021-08-25 NOTE — Progress Notes (Signed)
MD at bedside. 

## 2021-08-25 NOTE — Consult Note (Addendum)
Palliative Care Inpatient Consultation Service Reason: Pain and Symptom Management  61 yo with metastatic NSCLC dx at stage IIB 06/2020. He underwent VATS for lung cancer tumor resection but several months later developed brain metastasis. He underwent brain mets craniotomy resection at [facility]  10/2020 and subsequent SBRT for brain met recurrence in base of resection. He established care w/[name] 01/2021 and at that time had no known systemic disease. He had another recurrence of brain mets underwent SBRT again in 03/2021.  PET scan on 06/22/2021 showed systemic disease in bone (the right iliac crest was hypermetabolic), possibly liver and lung. He had another  surgical excision of recurrence of brain metastatic lesions at [facility] 06/2021. He continued to have severe and progressively worsening pain despite escalating doses of analgesia and was seen by [facility] [phone].  Pain Hx: Extensive history of Chronic Low Back Pain  and Chronic Abdominal Pain as far back in records as 2014. Right Sacroiliac pain on an off for 3 years (seen by PCP for this specific complaint 03/2020; 04/2020) PDMP shows regular opioid prescribing started 08/2020. The first documented pain issue since establishing w/[facility] was 05/27/2021 ED visit and it was for "right flank pain"-this was attributed to constipation and muscle strain.  Started  Bowel Regimen and Percocet 5/325 q6 prn, with relief Saw [name] 3/15, Percocet refilled, PET ordered 3/21 ED visit for Right Flank, Back and abdominal pain Given dexamethasone short course and omeprazole 3/24 Radiation Oncology rescheduled MRI due to severe "Right Flank and back pain". 3/24 Started on Duragesic 34mg TD by Dr. EMarin Olp was using oxycodone 128mevery 2 hours for breakthrough 4/11 Extampza 1866mtarted  5/3 [facility]en by [id number] Visit for severe Pain 5/14 ED Visit for severe Pain 5/18 [facility]  Provider Collaboration on complex pain issues: Started on Methadone, Decadron, NSAID short course, briefly improved 6/7 Admitted with intractable pain, nausea, functional status decline, weakness  Assessment/Problem List:  Admitted with intractable pain mostly due to cancer cancer progression and now unable to keep down oral medications or meet his nutritional requirements due to nausea and poor appetite. He has not taken his decadron or methadone for last couple of days according to his wife. He is not eating, He is not moving and is requiring a wheelchair.   Severe Multifactorial Acute on Chronic Pain Cancer Related Pain New Innumerable Liver Mets, likely capsular pain, also has new portal vein thrombosis, possible cholecystitis vs. Metastatic disease and peri-cholic edema. High ALKP. Large Expansile Upper Right Iliac Crest bone lesion, he now unable to put weight in his right leg or walk -suspect lesion is in the area of his hip flexor attachments and lower rectus attachments. May explain at least some of his severe pain on the right side. Early Vertebral Metastatic Disease Chronic Low Back Pain/Spinal Stenosis/Degenerative Disc Disease Long standing issues, intermittent opioid use for years, minimal relief with nerve root injections in the past. Chronic immobilization Possible Hyperalgesia/Complex Pain syndrome  Rapid Cancer Progression since prior CT scans done 3 months ago. Has not been able to receive systemic treatment. Liver, Bone and Brain Metastasis Possible Cholecystitis Constipation Will start Relistor daily Malnutrition Albumin 2.7 and Pre-Alb 14 Remote Hx of Severe Refractory Major Depressive Disorder, Anxiety Disorder, BH Hospitalization Hx of C. Diff Colitis after back surgery and chronic abdominal pain Hx of Intermittent Vasculitic Rash   Goals of Care: I shared my concerns about his disease  progression over such a short period of time as well as his loss of function  and difficult to control pain. Despite escalation to high doses of opioid he continues to suffer. He says he knows and has thought about the worst case scenario and dying but wants to "be positive".They are exhausted from his pain and suffering and the inability to get relief or have a clear path forward that includes treatment because of his severe pain. He is not able to hold or focus on a detailed conversation about his preferences for care.  Code Status:  I introduced the topic of Code Status and made a strong recommendation for DNR. In the event of him becoming pulseless or not breathing he has a <1% of surviving an attempt at resuscitation based on his Stage 4 Cancer, Frailty and medical comorbidity. He however asked to speak with me alone about this at another time. I offered time for reflection and discussion but also disclosed that no decision was a decision to have resuscitation attempted at the time of death. Will check back in on this if and when he is able to engage more. FULL CODE order remains.  Recommendations :  Start Decadron 8mg IV q12 hours Reglan 5mg q6 hours IV Hydromorphone 2mg q1 hour IV PRN Breakthrough Methadone 10mg TID after Reglan given Zofran IV in addition to Reglan PRN for nausea He would be a good candidate for a short term Ketamine infusion during this admission for cancer related pain given his opioid requirements and tolerance, history of major depression  and neurotoxic side effects from his current high use of opioids. Will consider this based on how he is doing tomorrow after IV steroids and IV opioids and if it aligns with his goals of of care. Discussed Radiation to the Right Iliac Crest w/ Dr. Squire, may not make a notable difference in his current pain situation- open for further discussion, but there is not an urgent need at this time and no cord compression issues. He may benefit from minimally invasive Osteocool RF ablation to Right iliac lesion for  related bone pain - it could be diagnostic quickly and therapeutic.  Mr. Brueggemann appears very fragile and weak, he is malnourished and requiring large amounts of pain medication and sedation for comfort and relief. If he elects to not pursue any additional aggressive interventions based on his prognosis and anticipated outcomes he would be best served by inpatient hospice care.   Will continue to work towards sustained relief from his pain and nausea and support him on the journey ahead.   Elizabeth Golding, DO Palliative Medicine  180 minutes including extensive chart review,  review of care everywhere, discussion of his care with other providers, PDMP review, discussion at bedside with patient and his wife.   

## 2021-08-26 ENCOUNTER — Inpatient Hospital Stay (HOSPITAL_COMMUNITY): Payer: Medicare HMO

## 2021-08-26 ENCOUNTER — Telehealth: Payer: Self-pay

## 2021-08-26 DIAGNOSIS — Z515 Encounter for palliative care: Secondary | ICD-10-CM | POA: Diagnosis not present

## 2021-08-26 DIAGNOSIS — K3184 Gastroparesis: Secondary | ICD-10-CM | POA: Diagnosis present

## 2021-08-26 DIAGNOSIS — E440 Moderate protein-calorie malnutrition: Secondary | ICD-10-CM | POA: Diagnosis present

## 2021-08-26 DIAGNOSIS — C787 Secondary malignant neoplasm of liver and intrahepatic bile duct: Secondary | ICD-10-CM | POA: Diagnosis present

## 2021-08-26 DIAGNOSIS — C7951 Secondary malignant neoplasm of bone: Secondary | ICD-10-CM | POA: Diagnosis present

## 2021-08-26 DIAGNOSIS — E8809 Other disorders of plasma-protein metabolism, not elsewhere classified: Secondary | ICD-10-CM | POA: Diagnosis present

## 2021-08-26 DIAGNOSIS — J449 Chronic obstructive pulmonary disease, unspecified: Secondary | ICD-10-CM | POA: Diagnosis present

## 2021-08-26 DIAGNOSIS — I81 Portal vein thrombosis: Secondary | ICD-10-CM | POA: Diagnosis present

## 2021-08-26 DIAGNOSIS — Z79899 Other long term (current) drug therapy: Secondary | ICD-10-CM

## 2021-08-26 DIAGNOSIS — R531 Weakness: Secondary | ICD-10-CM | POA: Diagnosis not present

## 2021-08-26 DIAGNOSIS — G893 Neoplasm related pain (acute) (chronic): Principal | ICD-10-CM

## 2021-08-26 DIAGNOSIS — C3490 Malignant neoplasm of unspecified part of unspecified bronchus or lung: Secondary | ICD-10-CM | POA: Diagnosis present

## 2021-08-26 DIAGNOSIS — E871 Hypo-osmolality and hyponatremia: Secondary | ICD-10-CM | POA: Diagnosis present

## 2021-08-26 DIAGNOSIS — C3492 Malignant neoplasm of unspecified part of left bronchus or lung: Secondary | ICD-10-CM | POA: Diagnosis not present

## 2021-08-26 DIAGNOSIS — F32A Depression, unspecified: Secondary | ICD-10-CM | POA: Diagnosis present

## 2021-08-26 DIAGNOSIS — C7952 Secondary malignant neoplasm of bone marrow: Secondary | ICD-10-CM | POA: Diagnosis not present

## 2021-08-26 DIAGNOSIS — C7931 Secondary malignant neoplasm of brain: Secondary | ICD-10-CM

## 2021-08-26 DIAGNOSIS — R52 Pain, unspecified: Secondary | ICD-10-CM | POA: Diagnosis present

## 2021-08-26 DIAGNOSIS — J9601 Acute respiratory failure with hypoxia: Secondary | ICD-10-CM | POA: Diagnosis present

## 2021-08-26 DIAGNOSIS — M5116 Intervertebral disc disorders with radiculopathy, lumbar region: Secondary | ICD-10-CM | POA: Diagnosis present

## 2021-08-26 DIAGNOSIS — R530 Neoplastic (malignant) related fatigue: Secondary | ICD-10-CM | POA: Diagnosis not present

## 2021-08-26 DIAGNOSIS — R6510 Systemic inflammatory response syndrome (SIRS) of non-infectious origin without acute organ dysfunction: Secondary | ICD-10-CM | POA: Diagnosis present

## 2021-08-26 DIAGNOSIS — K5900 Constipation, unspecified: Secondary | ICD-10-CM | POA: Diagnosis present

## 2021-08-26 DIAGNOSIS — Z79891 Long term (current) use of opiate analgesic: Secondary | ICD-10-CM | POA: Diagnosis not present

## 2021-08-26 DIAGNOSIS — R110 Nausea: Secondary | ICD-10-CM | POA: Diagnosis not present

## 2021-08-26 DIAGNOSIS — C799 Secondary malignant neoplasm of unspecified site: Secondary | ICD-10-CM | POA: Diagnosis not present

## 2021-08-26 DIAGNOSIS — Z87891 Personal history of nicotine dependence: Secondary | ICD-10-CM

## 2021-08-26 DIAGNOSIS — F419 Anxiety disorder, unspecified: Secondary | ICD-10-CM | POA: Diagnosis present

## 2021-08-26 DIAGNOSIS — M549 Dorsalgia, unspecified: Secondary | ICD-10-CM | POA: Diagnosis not present

## 2021-08-26 DIAGNOSIS — I700 Atherosclerosis of aorta: Secondary | ICD-10-CM | POA: Diagnosis present

## 2021-08-26 DIAGNOSIS — E872 Acidosis, unspecified: Secondary | ICD-10-CM | POA: Diagnosis present

## 2021-08-26 DIAGNOSIS — C772 Secondary and unspecified malignant neoplasm of intra-abdominal lymph nodes: Secondary | ICD-10-CM | POA: Diagnosis present

## 2021-08-26 DIAGNOSIS — E1143 Type 2 diabetes mellitus with diabetic autonomic (poly)neuropathy: Secondary | ICD-10-CM | POA: Diagnosis present

## 2021-08-26 DIAGNOSIS — D649 Anemia, unspecified: Secondary | ICD-10-CM | POA: Diagnosis present

## 2021-08-26 LAB — BLOOD CULTURE ID PANEL (REFLEXED) - BCID2

## 2021-08-26 LAB — HEPARIN LEVEL (UNFRACTIONATED)
Heparin Unfractionated: <0.1 IU/mL — ABNORMAL LOW (ref 0.30–0.70)
Heparin Unfractionated: 0.8 IU/mL — ABNORMAL HIGH (ref 0.30–0.70)
Heparin Unfractionated: 0.57 IU/mL (ref 0.30–0.70)

## 2021-08-26 LAB — CBC
HCT: 32.1 % — ABNORMAL LOW (ref 39.0–52.0)
Hemoglobin: 10.3 g/dL — ABNORMAL LOW (ref 13.0–17.0)
MCH: 30.7 pg (ref 26.0–34.0)
MCHC: 32.1 g/dL (ref 30.0–36.0)
MCV: 95.5 fL (ref 80.0–100.0)
Platelets: 523 10*3/uL — ABNORMAL HIGH (ref 150–400)
RBC: 3.36 MIL/uL — ABNORMAL LOW (ref 4.22–5.81)
RDW: 15.2 % (ref 11.5–15.5)
WBC: 9.9 10*3/uL (ref 4.0–10.5)
nRBC: 0 % (ref 0.0–0.2)

## 2021-08-26 LAB — COMPREHENSIVE METABOLIC PANEL
ALT: 22 U/L (ref 0–44)
AST: 25 U/L (ref 15–41)
Albumin: 2.2 g/dL — ABNORMAL LOW (ref 3.5–5.0)
Alkaline Phosphatase: 644 U/L — ABNORMAL HIGH (ref 38–126)
Anion gap: 10 (ref 5–15)
BUN: 7 mg/dL (ref 6–20)
CO2: 19 mmol/L — ABNORMAL LOW (ref 22–32)
Calcium: 6.9 mg/dL — ABNORMAL LOW (ref 8.9–10.3)
Chloride: 107 mmol/L (ref 98–111)
Creatinine, Ser: 0.49 mg/dL — ABNORMAL LOW (ref 0.61–1.24)
GFR, Estimated: >60 mL/min (ref >60)
Glucose, Bld: 127 mg/dL — ABNORMAL HIGH (ref 70–99)
Potassium: 3.3 mmol/L — ABNORMAL LOW (ref 3.5–5.1)
Sodium: 136 mmol/L (ref 135–145)
Total Bilirubin: 0.6 mg/dL (ref 0.3–1.2)
Total Protein: 5.3 g/dL — ABNORMAL LOW (ref 6.5–8.1)

## 2021-08-26 LAB — GLUCOSE, CAPILLARY
Glucose-Capillary: 130 mg/dL — ABNORMAL HIGH (ref 70–99)
Glucose-Capillary: 132 mg/dL — ABNORMAL HIGH (ref 70–99)
Glucose-Capillary: 103 mg/dL — ABNORMAL HIGH (ref 70–99)
Glucose-Capillary: 144 mg/dL — ABNORMAL HIGH (ref 70–99)

## 2021-08-26 IMAGING — MR MR ABDOMEN WO/W CM
20 series · 46 of 48 positions shown · IV contrast (7ml GADAVIST)
Comparison: [DATE] PET-CT. [DATE] abdominal sonogram.
[DATE] CT abdomen/pelvis.

CLINICAL DATA: Inpatient. Metastatic non-small cell lung cancer.
Indeterminate liver lesions on recent sonogram. Progression of
metastatic disease in the liver, lymph nodes and bones on CT from 1
day prior.

EXAM:
MRI ABDOMEN WITHOUT AND WITH CONTRAST
TECHNIQUE: Multiplanar multisequence MR imaging of the abdomen was performed
both before and after the administration of intravenous contrast.
CONTRAST:  7mL GADAVIST GADOBUTROL 1 MMOL/ML IV SOLN

[Series 3: T2 · coronal · 6.0mm · 1.56mm/px · 1 of 30 slices shown (1 of 2)]
[im 1/30]
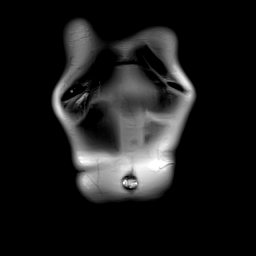

[Series 5: T2 fat-sat · axial · 6.0mm · 1.25mm/px · 1 of 36 slices shown]
[im 1/36]
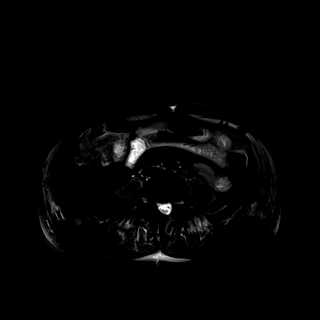

[Series 6: T1 · axial · 3.0mm · 1.25mm/px · z∈[-21,+192]mm · 2 of 72 slices shown (1 of 2)]
[im 1/72]
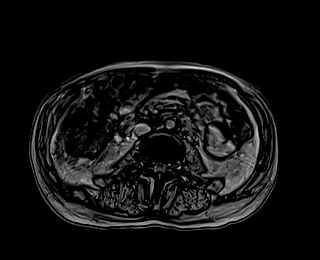
[im 72/72]
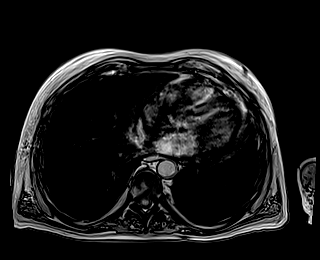

[Series 7: T1 · axial · 3.0mm · 1.25mm/px · z∈[-21,+192]mm · 2 of 72 slices shown (2 of 2)]
[im 1/72]
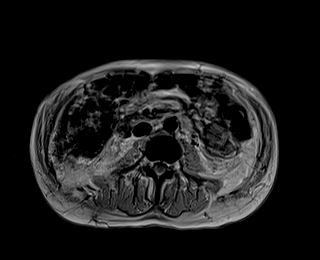
[im 72/72]
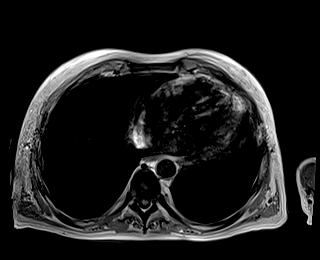

[Series 8: T2 · axial · 6.5mm · 1.56mm/px · 1 of 30 slices shown (2 of 2)]
[im 1/30]
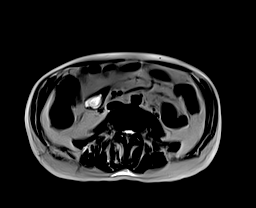

[Series 9: DWI · axial · 6.0mm · 1.49mm/px · z∈[-53,+199]mm · 2 of 72 slices shown (1 of 2)]
[im 1/72]
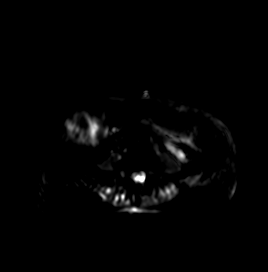
[im 72/72]
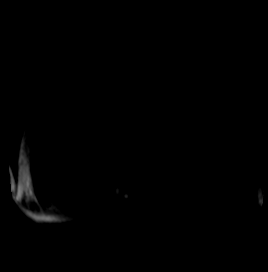

[Series 10: DWI · axial · 6.0mm · 1.49mm/px · 1 of 36 slices shown (2 of 2)]
[im 1/36]
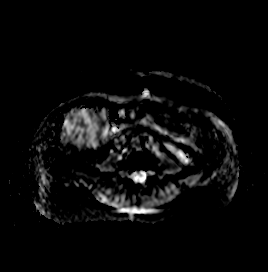

[Series 11: bSSFP · axial · 5.0mm · 0.84mm/px · z∈[-55,+190]mm · 2 of 50 slices shown]
[im 1/50]
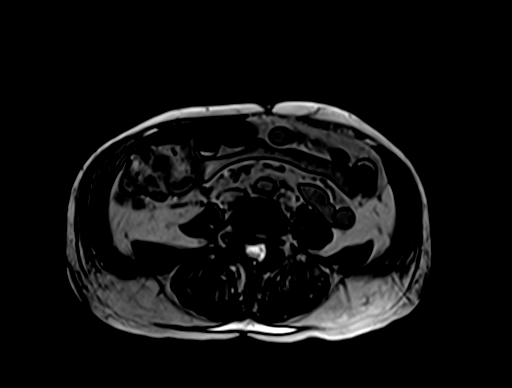
[im 50/50]
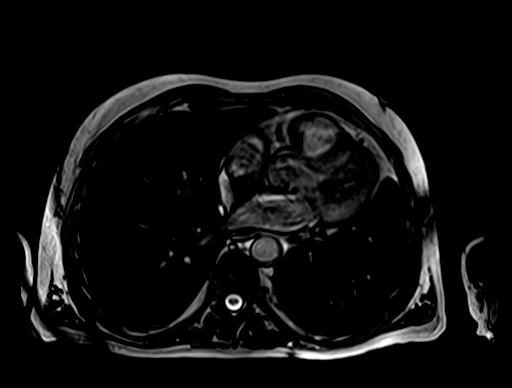

[Series 13: T1 dynamic · axial · 3.0mm · 1.25mm/px · z∈[-51,+186]mm · 3 of 80 slices shown (1 of 12)]
[im 1/80]
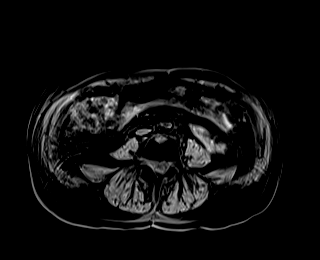
[im 40/80]
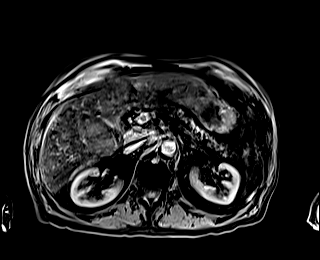
[im 80/80]
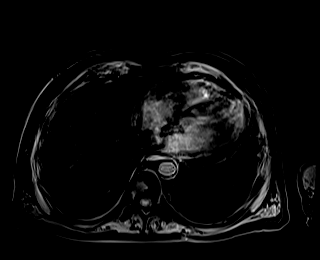

[Series 16: T1 dynamic · axial · 3.0mm · 1.25mm/px · z∈[-51,+186]mm · 3 of 80 slices shown (2 of 12)]
[im 1/80]
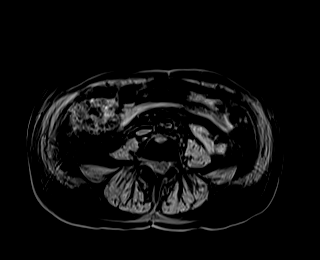
[im 40/80]
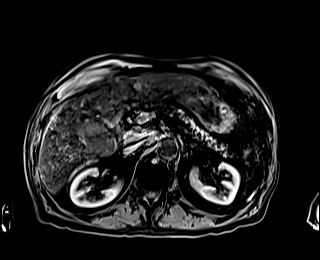
[im 80/80]
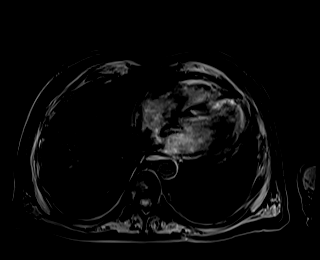

[Series 17: T1 dynamic · axial · 3.0mm · 1.25mm/px · z∈[-51,+186]mm · 3 of 80 slices shown (3 of 12)]
[im 1/80]
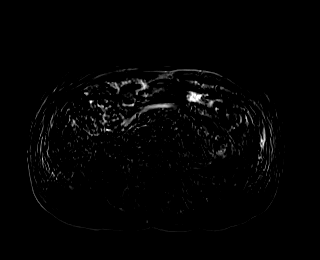
[im 40/80]
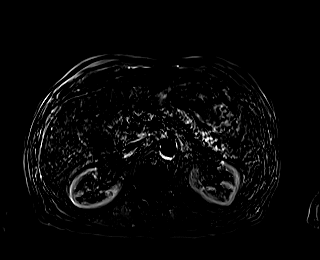
[im 80/80]
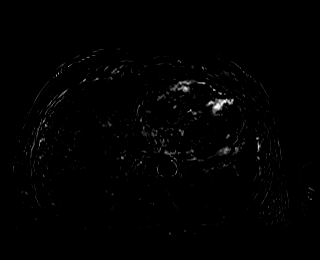

[Series 20: T1 dynamic · axial · 3.0mm · 1.25mm/px · z∈[-51,+186]mm · 3 of 80 slices shown (4 of 12)]
[im 1/80]
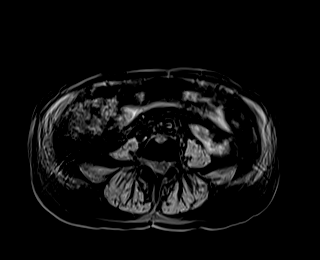
[im 40/80]
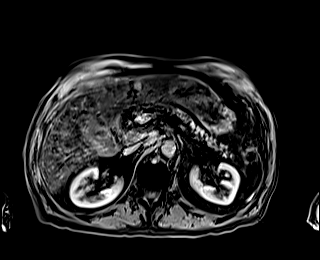
[im 80/80]
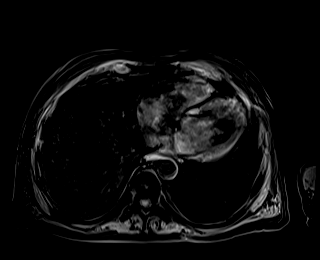

[Series 21: T1 dynamic · axial · 3.0mm · 1.25mm/px · z∈[-51,+186]mm · 3 of 80 slices shown (5 of 12)]
[im 1/80]
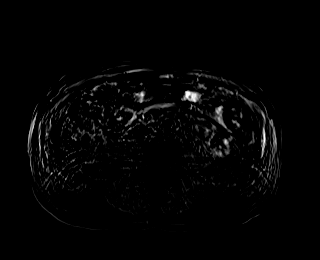
[im 40/80]
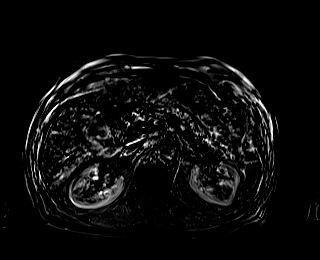
[im 80/80]
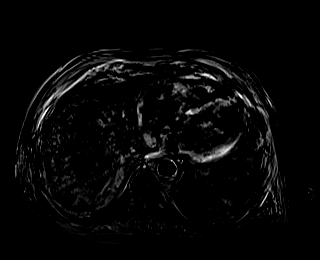

[Series 24: T1 dynamic · axial · 3.0mm · 1.25mm/px · z∈[-51,+186]mm · 3 of 80 slices shown (6 of 12)]
[im 1/80]
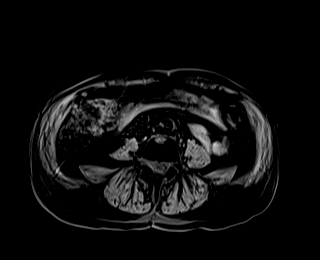
[im 40/80]
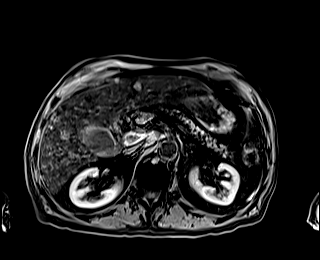
[im 80/80]
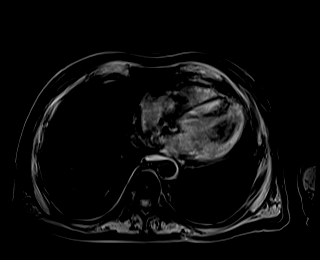

[Series 25: T1 dynamic · axial · 3.0mm · 1.25mm/px · z∈[-51,+186]mm · 3 of 80 slices shown (7 of 12)]
[im 1/80]
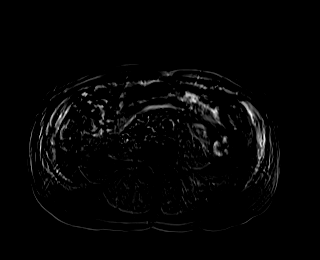
[im 40/80]
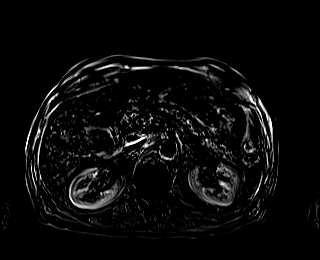
[im 80/80]
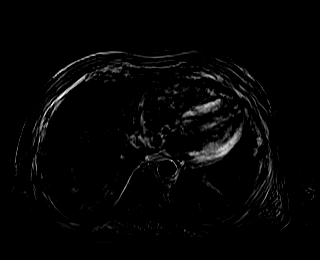

[Series 27: T1 dynamic · coronal · 3.0mm · 1.41mm/px · 3 of 72 slices shown (8 of 12)]
[im 1/72]
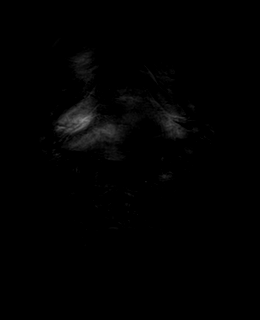
[im 36/72]
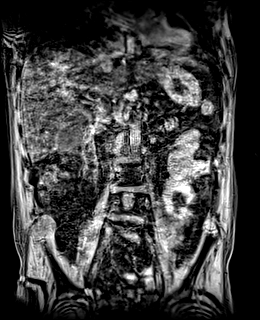
[im 72/72]
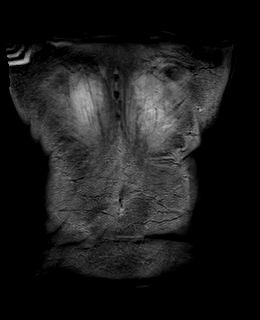

[Series 30: T1 dynamic · axial · 3.0mm · 1.25mm/px · z∈[-51,+186]mm · 3 of 80 slices shown (9 of 12)]
[im 1/80]
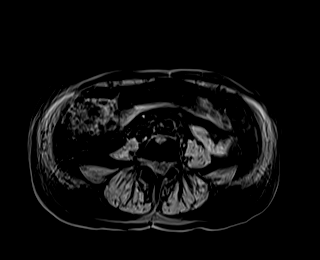
[im 40/80]
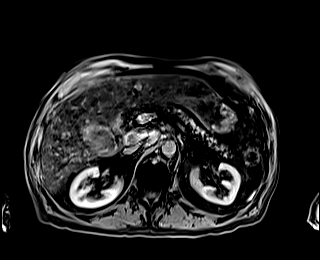
[im 80/80]
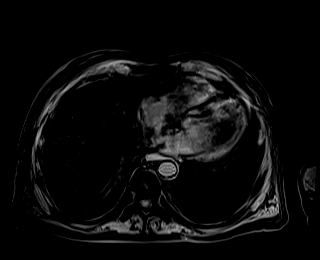

[Series 31: T1 dynamic · axial · 3.0mm · 1.25mm/px · z∈[-51,+186]mm · 3 of 80 slices shown (10 of 12)]
[im 1/80]
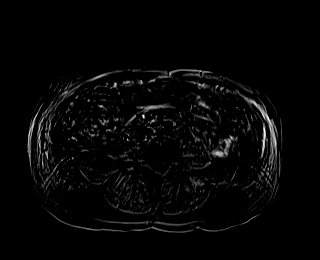
[im 40/80]
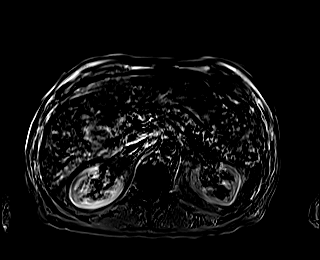
[im 80/80]
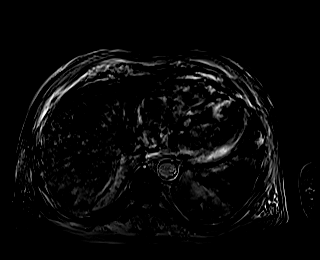

[Series 34: T1 dynamic · axial · 3.0mm · 1.25mm/px · z∈[-51,+186]mm · 3 of 80 slices shown (11 of 12)]
[im 1/80]
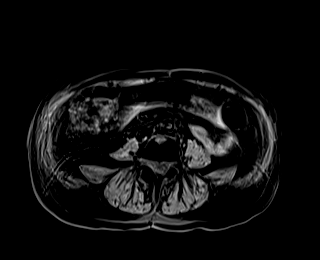
[im 40/80]
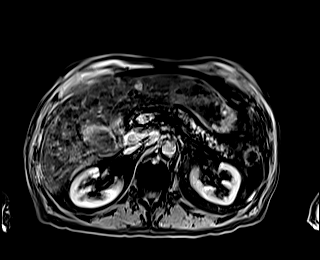
[im 80/80]
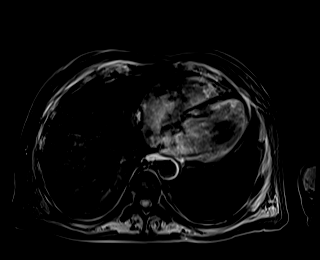

[Series 35: T1 dynamic · axial · 3.0mm · 1.25mm/px · 1 of 80 slices shown (12 of 12)]
[im 1/80]
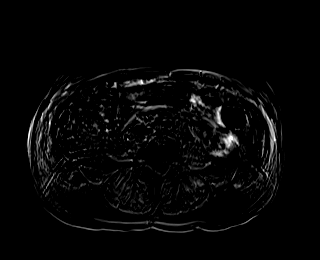

[46 of 48 positions shown; findings below may reference images not displayed]

FINDINGS: Lower chest: Solid 0.5 cm medial dependent right lung base pulmonary
nodule (series 8/image 5) as better seen on recent CT.

Hepatobiliary: Mild hepatomegaly. Background diffuse hepatic
hemosiderosis. Innumerable (> 30) enhancing solid liver masses
scattered throughout the liver, confluent in the right liver, with
restricted diffusion, compatible with metastatic disease, generally
new since [DATE] CT. Representative 2.0 x 1.8 cm caudate lobe
mass (series 5/image 9). Representative segment 7 right liver 2.9 x
2.1 cm mass (series 5/image 9). Representative 1.5 x 1.3 cm segment
4B left liver mass (series 5/image 16). Small amount of layering
sludge in gallbladder. Mild diffuse gallbladder wall thickening,
asymmetrically prominent adjacent to the liver. No cholelithiasis.
No pericholecystic fluid. Mild-to-moderate intrahepatic biliary
ductal dilatation throughout the right liver with relative sparing
of the left liver, with central extrinsic biliary compression by the
confluent central right liver metastases. Top normal caliber common
bile duct (6 mm diameter) with smooth distal tapering. No
choledocholithiasis.

Pancreas: No pancreatic mass or duct dilation.  No pancreas divisum.

Spleen: Normal size spleen. Diffuse splenic hemosiderosis. A few
scattered tiny T2 hyperintense subcentimeter splenic lesions are
unchanged from [DATE] CT, considered benign.

Adrenals/Urinary Tract: Normal adrenals. No hydronephrosis. Normal
kidneys with no renal mass.

Stomach/Bowel: Normal non-distended stomach. Visualized small and
large bowel is normal caliber, with no bowel wall thickening.

Vascular/Lymphatic: Normal caliber abdominal aorta. Patent renal,
splenic and hepatic veins. Extrinsic narrowing of the right portal
vein tree by the confluent right liver metastatic disease. Portal
veins appear grossly patent, with evaluation limited by motion
degradation. Enlarged 1.7 cm short axis diameter portacaval node
(series 8/image 16) and a few mildly enlarged aortocaval nodes,
largest 1.0 cm (series 8/image 24), all newly enlarged since
[DATE] CT.

Other: No abdominal ascites or focal fluid collection.

Musculoskeletal: Several enhancing osseous lesions throughout the
visualized axial skeleton, for example 2.4 cm in the T11 vertebral
body (series 3/image 23), not clearly seen on [DATE] CT, and
expansile 4.3 cm right iliac crest lesion (series 3/image 14),
increased from 0.8 cm on [DATE] CT.
IMPRESSION: 1. Innumerable (> 30) enhancing liver masses scattered throughout
the liver, confluent in the right liver, compatible with metastatic
disease, generally new since [DATE] CT.
2. Several enhancing osseous lesions throughout the visualized axial
skeleton, increased from [DATE] CT, compatible with progressive
multifocal osseous metastatic disease.
3. Mild-to-moderate intrahepatic biliary ductal dilatation
throughout the right liver and extrinsic narrowing of the right
portal vein tree by the confluent central right liver metastatic
disease. Top normal caliber common bile duct (6 mm diameter) with
smooth distal tapering. No choledocholithiasis.
4. Small amount of gallbladder sludge. Nonspecific mild gallbladder
wall thickening, potentially reactive or due to noninflammatory
edema. No cholelithiasis.
5. Hemosiderosis in the liver and spleen.

## 2021-08-26 MED ORDER — PREGABALIN 75 MG PO CAPS
75.0000 mg | ORAL_CAPSULE | Freq: Three times a day (TID) | ORAL | Status: DC
  Administered 2021-08-26 – 2021-08-28 (×7): 75 mg via ORAL
  Filled 2021-08-26 (×8): qty 1

## 2021-08-26 MED ORDER — HEPARIN BOLUS VIA INFUSION
2000.0000 [IU] | Freq: Once | INTRAVENOUS | Status: AC
  Administered 2021-08-26: 2000 [IU] via INTRAVENOUS
  Filled 2021-08-26: qty 2000

## 2021-08-26 MED ORDER — HYDROMORPHONE HCL 1 MG/ML IJ SOLN
1.0000 mg | INTRAMUSCULAR | Status: AC | PRN
  Administered 2021-08-26 (×3): 1 mg via INTRAVENOUS
  Filled 2021-08-26 (×3): qty 1

## 2021-08-26 MED ORDER — KETOROLAC TROMETHAMINE 30 MG/ML IJ SOLN
30.0000 mg | Freq: Three times a day (TID) | INTRAMUSCULAR | Status: AC
  Administered 2021-08-26 – 2021-08-27 (×6): 30 mg via INTRAVENOUS
  Filled 2021-08-26 (×6): qty 1

## 2021-08-26 MED ORDER — SENNOSIDES-DOCUSATE SODIUM 8.6-50 MG PO TABS
1.0000 | ORAL_TABLET | Freq: Two times a day (BID) | ORAL | Status: DC
  Administered 2021-08-26: 1 via ORAL
  Filled 2021-08-26: qty 1

## 2021-08-26 MED ORDER — SENNOSIDES-DOCUSATE SODIUM 8.6-50 MG PO TABS
2.0000 | ORAL_TABLET | Freq: Two times a day (BID) | ORAL | Status: DC
  Administered 2021-08-27 – 2021-09-02 (×7): 2 via ORAL
  Filled 2021-08-26 (×12): qty 2

## 2021-08-26 MED ORDER — POTASSIUM CHLORIDE CRYS ER 20 MEQ PO TBCR
20.0000 meq | EXTENDED_RELEASE_TABLET | Freq: Once | ORAL | Status: AC
  Administered 2021-08-26: 20 meq via ORAL
  Filled 2021-08-26: qty 1

## 2021-08-26 MED ORDER — PANTOPRAZOLE SODIUM 40 MG PO TBEC
40.0000 mg | DELAYED_RELEASE_TABLET | Freq: Every day | ORAL | Status: DC
  Administered 2021-08-26 – 2021-08-30 (×5): 40 mg via ORAL
  Filled 2021-08-26 (×5): qty 1

## 2021-08-26 MED ORDER — POLYETHYLENE GLYCOL 3350 17 G PO PACK: 17.0000 g | PACK | Freq: Every day | ORAL | Status: DC | PRN

## 2021-08-26 MED ORDER — HYDROMORPHONE HCL 1 MG/ML IJ SOLN
4.0000 mg | INTRAMUSCULAR | Status: AC | PRN
  Administered 2021-08-26 (×2): 4 mg via INTRAVENOUS
  Filled 2021-08-26 (×2): qty 4

## 2021-08-26 MED ORDER — GADOBUTROL 1 MMOL/ML IV SOLN
7.0000 mL | Freq: Once | INTRAVENOUS | Status: AC | PRN
  Administered 2021-08-26: 7 mL via INTRAVENOUS

## 2021-08-26 MED ORDER — SODIUM CHLORIDE 0.9 % IV SOLN
510.0000 mg | Freq: Once | INTRAVENOUS | Status: AC
  Administered 2021-08-26: 510 mg via INTRAVENOUS
  Filled 2021-08-26: qty 17

## 2021-08-26 MED ORDER — OXYCODONE HCL 5 MG PO TABS
5.0000 mg | ORAL_TABLET | Freq: Four times a day (QID) | ORAL | Status: DC | PRN
  Administered 2021-08-26 – 2021-08-27 (×3): 5 mg via ORAL
  Filled 2021-08-26 (×3): qty 1

## 2021-08-26 MED ORDER — LACTULOSE 10 GM/15ML PO SOLN
13.3333 g | Freq: Two times a day (BID) | ORAL | Status: DC
  Administered 2021-08-27 – 2021-09-01 (×6): 20 g via ORAL
  Filled 2021-08-26 (×12): qty 30

## 2021-08-26 MED ORDER — ESCITALOPRAM OXALATE 10 MG PO TABS
5.0000 mg | ORAL_TABLET | Freq: Every day | ORAL | Status: DC
  Administered 2021-08-26 – 2021-09-03 (×9): 5 mg via ORAL
  Filled 2021-08-26 (×9): qty 1

## 2021-08-26 MED ORDER — DRONABINOL 2.5 MG PO CAPS
2.5000 mg | ORAL_CAPSULE | Freq: Three times a day (TID) | ORAL | Status: DC
  Administered 2021-08-26: 2.5 mg via ORAL
  Filled 2021-08-26: qty 1

## 2021-08-26 MED ORDER — LORAZEPAM 1 MG PO TABS
1.0000 mg | ORAL_TABLET | Freq: Four times a day (QID) | ORAL | Status: DC | PRN
  Administered 2021-08-26 – 2021-08-30 (×4): 1 mg via ORAL
  Filled 2021-08-26 (×4): qty 1

## 2021-08-26 NOTE — Plan of Care (Signed)
Pt's pain not under control this shift. Pt asking for IV dilaudid for breakthrough pain. Pt continues to be 9/10. Pt's son in room this shift. Pt asked that son not be told his diagnosis. Son very distraught about this and will try to talk to other family members or be present today for answers. Problem: Elimination: Goal: Will not experience complications related to bowel motility Outcome: Progressing Goal: Will not experience complications related to urinary retention Outcome: Progressing   Problem: Pain Managment: Goal: General experience of comfort will improve Outcome: Progressing   Problem: Safety: Goal: Ability to remain free from injury will improve Outcome: Progressing   Problem: Skin Integrity: Goal: Risk for impaired skin integrity will decrease Outcome: Progressing

## 2021-08-26 NOTE — Hospital Course (Addendum)
61 y.o. 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 Presenting with intractable back pain and abdominal pain-  imaging w/ new portal vein thrombosis-started on anticoagulation after discussing with [name], palliative care hematology consulted On admission: MR lumbar thoracic spine shows-new and progressive thoracic and lumbosacral osseous metastatic disease without significant extraosseous extension or pathologic fracture, enlarging bilateral iliac bone and upper abdominal nodal metastatic disease, unchanged multilevel lumbar spondylosis.

## 2021-08-26 NOTE — Telephone Encounter (Signed)
Received phone call from patient wife stating that she has jury duty later in the month of June and requesting a letter for deferral or excuse. Pt wife aware that this clinic will write a letter for jury duty excuse for her and she can pick it up from the front desk on Friday 08/27/2021. Pt wife stated that she will send "someone to pick it up as I am stressed out enough with [facility] being admitted and having cancer."  Sympathy and understanding offered to wife for stress. Pt wife verbalized understanding and had no further questions.

## 2021-08-26 NOTE — Progress Notes (Addendum)
[place] for heparin Indication:  VTE treatment  (portal vein thrombosis)  Allergies  Allergen Reactions   Gabapentin Swelling    Leg swelling    Levetiracetam Other (See Comments)    Personality changes   Pregabalin Swelling    Leg swelling    Bupropion Nausea And Vomiting   Duloxetine Other (See Comments)    Excessive sleeping   Ezetimibe Other (See Comments)    Unknown reaction   Other Other (See Comments)    Antibiotics - per wife at bedside, patient will develop C-diff if given "any antibiotic"   Prochlorperazine Nausea And Vomiting   Lamotrigine Itching and Rash    Patient Measurements: Height: 6' (182.9 cm) Weight: 72.1 kg (159 lb) IBW/kg (Calculated) : 77.6 Heparin Dosing Weight: 72.1 kg  Vital Signs: Temp: 97.4 F (36.3 C) (06/08 1109) Temp Source: Oral (06/08 1109) BP: 131/92 (06/08 1109) Pulse Rate: 72 (06/08 1109)  Labs: Recent Labs    08/25/21 5409 08/25/21 0816 08/25/21 1730 08/25/21 2153 08/26/21 0532 08/26/21 0613 08/26/21 1343  HGB 12.0*  --   --   --  10.3*  --   --   HCT 36.3*  --   --   --  32.1*  --   --   PLT 591*  --   --   --  523*  --   --   APTT  --  38*  --   --   --   --   --   LABPROT  --  18.9*  --   --   --   --   --   INR  --  1.6*  --   --   --   --   --   HEPARINUNFRC  --   --    < > 0.31  --  <0.10* 0.80*  CREATININE 0.49*  --   --   --  0.49*  --   --    < > = values in this interval not displayed.     Estimated Creatinine Clearance: 102.6 mL/min (A) (by C-G formula based on SCr of 0.49 mg/dL (L)).   Medical History: Past Medical History:  Diagnosis Date   Chronic back pain    Chronic GERD    Chronic pain    COPD (chronic obstructive pulmonary disease) (HCC)    Coronary artery disease    Depression    Diabetes mellitus without complication (HCC)    Gastroparesis    Goals of care, counseling/discussion 01/27/2021   High risk medication use    Hyperlipidemia    MI  (myocardial infarction) ([place])    Non-small cell lung cancer metastatic to brain ([place]) 01/27/2021   Restless leg syndrome     Assessment: 61 year old male presented with abdominal pain. Patient with history of NSCLC with mets to brain/liver/spine/hip. 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 Recent craniotomy in April 2023 at [facility]; [facility] spoke with neurosurgery team there who state no contraindication for anticoagulation from their standpoint. Pharmacy consulted to start heparin.  No anticoagulation noted PTA.   08/26/2021: Heparin level slightly supratherapeutic at 0.8.   Discussed with RN, no bleeding noted. Heparin is running in the L forearm and heparin level was drawn appropriately from R forearm.  Goal of Therapy:  Heparin level 0.3-0.7 units/ml Monitor platelets by anticoagulation protocol: Yes   Plan:  -Reduce heparin drip to 1500 units/hr -Check heparin level ~ 6 hours after rate change -  CBC daily while on heparin infusion -Monitor for s/sx of bleeding  Dimple Nanas, PharmD 08/26/2021 2:15 PM

## 2021-08-26 NOTE — Progress Notes (Addendum)
PHARMACY - PHYSICIAN COMMUNICATION CRITICAL VALUE ALERT - BLOOD CULTURE IDENTIFICATION (BCID)  Bill Garza is an 61 y.o. male who presented to [facility] on 08/25/2021 with metastatic adenocarcinoma of the lung and portal vein thrombosis  Assessment: BCID + for Staph epidermidis in 1 out of 3 bottles (no resistance detected).  Likely contaminant. Patient afebrile; normal WBC  Name of physician (or Provider) Contacted: Gershon Cull, FNP  Current antibiotics: none  Changes to prescribed antibiotics recommended:  No changes recommended at this time  Results for orders placed or performed during the hospital encounter of 08/25/21  Blood Culture ID Panel (Reflexed) (Collected: 08/25/2021  5:30 PM)  Result Value Ref Range   Enterococcus faecalis NOT DETECTED NOT DETECTED   Enterococcus Faecium NOT DETECTED NOT DETECTED   Listeria monocytogenes NOT DETECTED NOT DETECTED   Staphylococcus species DETECTED (A) NOT DETECTED   Staphylococcus aureus (BCID) NOT DETECTED NOT DETECTED   Staphylococcus epidermidis DETECTED (A) NOT DETECTED   Staphylococcus lugdunensis NOT DETECTED NOT DETECTED   Streptococcus species NOT DETECTED NOT DETECTED   Streptococcus agalactiae NOT DETECTED NOT DETECTED   Streptococcus pneumoniae NOT DETECTED NOT DETECTED   Streptococcus pyogenes NOT DETECTED NOT DETECTED   A.calcoaceticus-baumannii NOT DETECTED NOT DETECTED   Bacteroides fragilis NOT DETECTED NOT DETECTED   Enterobacterales NOT DETECTED NOT DETECTED   Enterobacter cloacae complex NOT DETECTED NOT DETECTED   Escherichia coli NOT DETECTED NOT DETECTED   Klebsiella aerogenes NOT DETECTED NOT DETECTED   Klebsiella oxytoca NOT DETECTED NOT DETECTED   Klebsiella pneumoniae NOT DETECTED NOT DETECTED   Proteus species NOT DETECTED NOT DETECTED   Salmonella species NOT DETECTED NOT DETECTED   Serratia marcescens NOT DETECTED NOT DETECTED   Haemophilus influenzae NOT DETECTED NOT DETECTED   Neisseria  meningitidis NOT DETECTED NOT DETECTED   Pseudomonas aeruginosa NOT DETECTED NOT DETECTED   Stenotrophomonas maltophilia NOT DETECTED NOT DETECTED   Candida albicans NOT DETECTED NOT DETECTED   Candida auris NOT DETECTED NOT DETECTED   Candida glabrata NOT DETECTED NOT DETECTED   Candida krusei NOT DETECTED NOT DETECTED   Candida parapsilosis NOT DETECTED NOT DETECTED   Candida tropicalis NOT DETECTED NOT DETECTED   Cryptococcus neoformans/gattii NOT DETECTED NOT DETECTED   Methicillin resistance mecA/C NOT DETECTED NOT DETECTED    Everette Rank, PharmD 08/26/2021  11:10 PM

## 2021-08-26 NOTE — Progress Notes (Signed)
[place] for heparin Indication:  VTE treatment  (portal vein thrombosis)  Allergies  Allergen Reactions   Gabapentin Swelling    Leg swelling    Levetiracetam Other (See Comments)    Personality changes   Pregabalin Swelling    Leg swelling    Bupropion Nausea And Vomiting   Duloxetine Other (See Comments)    Excessive sleeping   Ezetimibe Other (See Comments)    Unknown reaction   Other Other (See Comments)    Antibiotics - per wife at bedside, patient will develop C-diff if given "any antibiotic"   Prochlorperazine Nausea And Vomiting   Lamotrigine Itching and Rash    Patient Measurements: Height: 6' (182.9 cm) Weight: 72.1 kg (159 lb) IBW/kg (Calculated) : 77.6 Heparin Dosing Weight: 72.1 kg  Vital Signs: Temp: 98 F (36.7 C) (06/08 0359) Temp Source: Oral (06/08 0359) BP: 178/98 (06/08 0359) Pulse Rate: 100 (06/08 0359)  Labs: Recent Labs    08/23/21 1320 08/23/21 1403 08/25/21 8101 08/25/21 0816 08/25/21 1730 08/25/21 2153 08/26/21 0532 08/26/21 0613  HGB 13.2  --  12.0*  --   --   --  10.3*  --   HCT 39.0  --  36.3*  --   --   --  32.1*  --   PLT 670*  --  591*  --   --   --  523*  --   APTT  --   --   --  38*  --   --   --   --   LABPROT  --   --   --  18.9*  --   --   --   --   INR  --   --   --  1.6*  --   --   --   --   HEPARINUNFRC  --   --   --   --  0.16* 0.31  --  <0.10*  CREATININE  --  0.55* 0.49*  --   --   --   --   --      Estimated Creatinine Clearance: 102.6 mL/min (A) (by C-G formula based on SCr of 0.49 mg/dL (L)).   Medical History: Past Medical History:  Diagnosis Date   Chronic back pain    Chronic GERD    Chronic pain    COPD (chronic obstructive pulmonary disease) (HCC)    Coronary artery disease    Depression    Diabetes mellitus without complication (HCC)    Gastroparesis    Goals of care, counseling/discussion 01/27/2021   High risk medication use    Hyperlipidemia    MI  (myocardial infarction) ([place])    Non-small cell lung cancer metastatic to brain ([place]) 01/27/2021   Restless leg syndrome     Assessment: 61 year old male presented with abdominal pain. Patient with history of NSCLC with mets to brain/liver/spine/hip. 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 Recent craniotomy in April 2023 at [facility]; [facility] spoke with neurosurgery team there who state no contraindication for anticoagulation from their standpoint. Pharmacy consulted to start heparin.  No anticoagulation noted PTA.   08/26/2021: Heparin level undetectable (<0.10) after heparin rate increase to 1400 units/hr yesterday Hemoglobin slight decrease from 12 > 10.3 today. Patient does have history of anemia and is receiving IV iron today. Drop in hemoglobin could also be dilutional. No signs of bleeding per RN.  Discussed with RN, no issues with heparin infusing. No reports  of heparin drip being held overnight.   Goal of Therapy:  Heparin level 0.3-0.7 units/ml Monitor platelets by anticoagulation protocol: Yes   Plan:  -Heparin 2000 units IV bolus x1 -Increase heparin drip to 1550 units/hr  -Check heparin level ~ 6 hours after rate change -CBC daily while on heparin infusion -Monitor for s/sx of bleeding  Dimple Nanas, PharmD 08/26/2021 7:03 AM

## 2021-08-26 NOTE — Progress Notes (Signed)
ANTICOAGULATION CONSULT NOTE   Pharmacy Consult for heparin Indication:  VTE treatment  (portal vein thrombosis)  Allergies  Allergen Reactions   Gabapentin Swelling    Leg swelling    Levetiracetam Other (See Comments)    Personality changes   Pregabalin Swelling    Leg swelling    Bupropion Nausea And Vomiting   Duloxetine Other (See Comments)    Excessive sleeping   Ezetimibe Other (See Comments)    Unknown reaction   Other Other (See Comments)    Antibiotics - per wife at bedside, patient will develop C-diff if given "any antibiotic"   Prochlorperazine Nausea And Vomiting   Lamotrigine Itching and Rash    Patient Measurements: Height: 6' (182.9 cm) Weight: 72.1 kg (159 lb) IBW/kg (Calculated) : 77.6 Heparin Dosing Weight: 72.1 kg  Vital Signs: Temp: 97.7 F (36.5 C) (06/08 2050) Temp Source: Oral (06/08 1727) BP: 123/78 (06/08 2050) Pulse Rate: 74 (06/08 2050)  Labs: Recent Labs    08/25/21 0812 08/25/21 0816 08/25/21 1730 08/26/21 0532 08/26/21 0613 08/26/21 1343 08/26/21 2029  HGB 12.0*  --   --  10.3*  --   --   --   HCT 36.3*  --   --  32.1*  --   --   --   PLT 591*  --   --  523*  --   --   --   APTT  --  38*  --   --   --   --   --   LABPROT  --  18.9*  --   --   --   --   --   INR  --  1.6*  --   --   --   --   --   HEPARINUNFRC  --   --    < >  --  <0.10* 0.80* 0.57  CREATININE 0.49*  --   --  0.49*  --   --   --    < > = values in this interval not displayed.     Estimated Creatinine Clearance: 102.6 mL/min (A) (by C-G formula based on SCr of 0.49 mg/dL (L)).   Medical History: Past Medical History:  Diagnosis Date   Chronic back pain    Chronic GERD    Chronic pain    COPD (chronic obstructive pulmonary disease) (HCC)    Coronary artery disease    Depression    Diabetes mellitus without complication (HCC)    Gastroparesis    Goals of care, counseling/discussion 01/27/2021   High risk medication use    Hyperlipidemia    MI  (myocardial infarction) ([place])    Non-small cell lung cancer metastatic to brain ([place]) 01/27/2021   Restless leg syndrome     Assessment: 61 year old male presented with abdominal pain. Patient with history of NSCLC with mets to brain/liver/spine/hip. 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 Recent craniotomy in April 2023 at [facility]; [facility] spoke with neurosurgery team there who state no contraindication for anticoagulation from their standpoint. Pharmacy consulted to start heparin.  No anticoagulation noted PTA.   08/26/2021: PM heparin level = 0.57 units/mL, now therapeutic after rate decrease earlier today to 1500 units/hr   CBC: Hgb low/decreased to 10.3, Pltc elevated No bleeding or infusion issues noted per nursing  Goal of Therapy:  Heparin level 0.3-0.7 units/ml Monitor platelets by anticoagulation protocol: Yes   Plan:  -Continue heparin infusion at 1500 units/hr -Check confirmatory heparin level in 6  hours to ensure remains within therapeutic range -Daily CBC, heparin level  -Monitor closely for s/sx of bleeding   Lindell Spar, PharmD, BCPS Clinical Pharmacist  08/26/2021 9:26 PM

## 2021-08-26 NOTE — Progress Notes (Signed)
PROGRESS NOTE Bill Garza  BTD:[id number] DOB: [birth year] DOA: 08/25/2021 PCP: Aleen Campi, NP   Brief Narrative/Hospital Course: 61 y.o. 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 Presenting with intractable back pain and abdominal pain. Worsening CA. Onco added to team. MRI T/L spine ordered. Good neuro exam, although he says he is unable to walk d/t RLE pain. Also with new portal vein thrombosis. Had craniotomy mid-April. [facility] ok with anticoagulation. On ab pain w/u, his GB is equivocal. Spoke with gen surgery.  Ordered HIDA and call back if positive. Reasonable to start zosyn.  On admission: MR lumbar thoracic spine shows-new and progressive thoracic and lumbosacral osseous metastatic disease without significant extraosseous extension or pathologic fracture, enlarging bilateral iliac bone and upper abdominal nodal metastatic disease, unchanged multilevel lumbar spondylosis.  6.8-overnight afebrile, BP 140s to 170s, on room air, labs with potassium 3.3, WC count improved to 9.9 from 12.8 hemoglobin 10.3 from 12.0 anemia panel with iron 31 TIBC 207.  ALP elevated but slowly downtrending 644 from 710.    Subjective: Seen and examined this morning. Saw Dr Marin Olp this am Pain some better, had pain all night, was sleepy after getting pain medication woke up after interacting with daughter. 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  MRI lumbar and thoracic spine reviewed.  MRI of the liver with and without contrast ordered to better characterize his liver lesions.  Portal vein thrombosis: Continue heparin drip and switch to DOAC closer to discharge.  Oncology following.  On additional neurosurgery team  0Dr. Salomon Fick was consulted about anticoagulation and there is no contraindication to [facility] at this point w/ respect to his brain.  DM2: A1c 7.1 blood sugar well controlled continue SSI 0 to 9 units.  Continue to hold metformin Recent Labs  Lab 08/25/21 1643 08/25/21 1730 08/25/21 2016 08/26/21 0738 08/26/21 1129  GLUCAP 107*  --  118* 130* 132*  HGBA1C  --  7.1*  --   --   --     Anxiety/depression: Appears anxious, resume home meds-Lexapro and Ativan.  COPD Tobacco abuse: Not in exacerbation continue home inhalers-Dulera, Singulair  Elevated alkaline phosphatase level: Likely from metastatic disease.  Monitor  SIRS-on admission mild tachycardia, leukocytosis 13.3.  No obvious infection source noted patient had CT chest abdomen pelvis leukocytosis has resolved likely reactive.  UA unremarkable with negative nitrate and leukocytes.  Holding off on antibiotics.  We will not be surprised if WBC counts trend up since he is now on Decadron IV.  Follow-up blood culture.??  Of possible cholecystitis on admission, no significant abdominal tenderness follow-up MRI.  Patient refusing HIDA scan since he needs to be  off pain medication  Normocytic anemia hemoglobin downtrending.  Monitor Recent Labs  Lab 08/23/21 1320 08/25/21 0812 08/26/21 0532  HGB 13.2 12.0* 10.3*  HCT 39.0 36.3* 32.1*      Hyponatremia: Resolved Recent Labs  Lab 08/23/21 1403 08/25/21 0812 08/26/21 0532  NA 130* 132* 136     Goals of care: Oncology has discussed extensively patient has an incurable malignancy continues to desire full code DNR has been recommended  DVT prophylaxis: SCDs Start: 08/25/21 1527 Code Status:   Code Status: Full Code Family Communication: plan of care discussed with patient/family at bedside. Patient status is: Inpatient because of ongoing need for IV pain management with intractable pain Level of care: Telemetry   Dispo: The patient is from: Home            Anticipated disposition: TBD  Mobility Assessment (last 72 hours)     Mobility Assessment     Row Name 08/25/21 2031 08/25/21 1800         Does patient have an order for bedrest or is patient medically unstable No - Continue assessment No - Continue assessment      What is the highest level of mobility based on the progressive mobility assessment? Level 6 (Walks independently in room and hall) - Balance while walking in room without assist - Complete --  UTA pt in too much pain to get out of bed at this time                Objective: Vitals last 24 hrs: Vitals:   08/25/21 1530 08/25/21 2014 08/25/21 2342 08/26/21 0359  BP: (!) 142/85 (!) 153/99 (!) 178/99 (!) 178/98  Pulse: 77 93 95 100  Resp: 20 17 16 16   Temp: 97.7 F (36.5 C) 97.7 F (36.5 C) 97.9 F (36.6 C) 98 F (36.7 C)  TempSrc:   Oral Oral  SpO2: 98% 99% 98% 97%  Weight:      Height:       Weight change:   Physical Examination: General exam: alert awake,older than stated age, weak appearing. HEENT:Oral mucosa moist, Ear/Nose WNL grossly, dentition normal. Respiratory system: bilaterally diminished BS, no use of accessory muscle Cardiovascular system: S1 & S2 +, No JVD. Gastrointestinal system: Abdomen soft,NT,ND, BS+ Nervous System:Alert, awake, moving extremities and grossly nonfocal Extremities: LE edema mild,distal peripheral  pulses palpable.  Skin: No rashes,no icterus. MSK: Normal muscle bulk,tone, power  Medications reviewed:  Scheduled Meds:  cyclobenzaprine  5 mg Oral TID   dexamethasone (DECADRON) injection  8 mg Intravenous Q24H   insulin aspart  0-9 Units Subcutaneous TID WC   ketorolac  30 mg Intravenous Q8H   lidocaine  1 patch Transdermal Once   methadone  10 mg Oral Q8H   metoCLOPramide (REGLAN) injection  5 mg Intravenous Q6H   mometasone-formoterol  2 puff Inhalation BID   montelukast  10 mg Oral QHS   pregabalin  75 mg Oral TID   Continuous Infusions:  sodium chloride 75 mL/hr at 08/25/21 2151   ferumoxytol (FERAHEME) 510 mg in sodium chloride 0.9 % 100 mL IVPB     heparin 1,550 Units/hr (08/26/21 0757)      Diet Order             Diet clear liquid Room service appropriate? Yes; Fluid consistency: Thin  Diet effective now  Intake/Output Summary (Last 24 hours) at 08/26/2021 1022 Last data filed at 08/26/2021 0958 Gross per 24 hour  Intake 1670.28 ml  Output --  Net 1670.28 ml   Net IO Since Admission: 2,670.28 mL [08/26/21 1022]  Wt Readings from Last 3 Encounters:  08/25/21 72.1 kg  08/01/21 72.1 kg  07/30/21 76.7 kg     Unresulted Labs (From admission, onward)     Start     Ordered   08/26/21 1400  Heparin level (unfractionated)  Once-Timed,   TIMED        08/26/21 0821   08/25/21 1349  Culture, blood (Routine X 2) w Reflex to ID Panel  BLOOD CULTURE X 2,   R (with TIMED occurrences)      08/25/21 1348          Data Reviewed: I have personally reviewed following labs and imaging studies CBC: Recent Labs  Lab 08/23/21 1320 08/25/21 0812 08/26/21 0532  WBC 13.3* 12.8* 9.9  NEUTROABS 10.0* 10.4*  --   HGB 13.2 12.0* 10.3*  HCT 39.0 36.3* 32.1*  MCV 90.3 91.2 95.5  PLT 670* 591* 384*   Basic Metabolic Panel: Recent Labs  Lab 08/23/21 1403 08/25/21 0812 08/26/21 0532  NA 130* 132* 136  K 3.5 3.5 3.3*  CL 98 102 107   CO2 20* 20* 19*  GLUCOSE 143* 153* 127*  BUN 12 11 7   CREATININE 0.55* 0.49* 0.49*  CALCIUM 8.5* 8.4* 6.9*   GFR: Estimated Creatinine Clearance: 102.6 mL/min (A) (by C-G formula based on SCr of 0.49 mg/dL (L)). Liver Function Tests: Recent Labs  Lab 08/23/21 1403 08/25/21 0812 08/26/21 0532  AST 20 24 25   ALT 27 25 22   ALKPHOS 742* 710* 644*  BILITOT 0.5 0.9 0.6  PROT 6.2* 6.4* 5.3*  ALBUMIN 3.3* 2.7* 2.2*   Recent Labs  Lab 08/25/21 0812  LIPASE 19   No results for input(s): "AMMONIA" in the last 168 hours. Coagulation Profile: Recent Labs  Lab 08/25/21 0816  INR 1.6*   BNP (last 3 results) No results for input(s): "PROBNP" in the last 8760 hours. HbA1C: Recent Labs    08/25/21 1730  HGBA1C 7.1*   CBG: Recent Labs  Lab 08/25/21 1643 08/25/21 2016 08/26/21 0738  GLUCAP 107* 118* 130*   Lipid Profile: No results for input(s): "CHOL", "HDL", "LDLCALC", "TRIG", "CHOLHDL", "LDLDIRECT" in the last 72 hours. Thyroid Function Tests: No results for input(s): "TSH", "T4TOTAL", "FREET4", "T3FREE", "THYROIDAB" in the last 72 hours. Sepsis Labs: No results for input(s): "PROCALCITON", "LATICACIDVEN" in the last 168 hours.  No results found for this or any previous visit (from the past 240 hour(s)).  Antimicrobials: Anti-infectives (From admission, onward)    Start     Dose/Rate Route Frequency Ordered Stop   08/25/21 1400  piperacillin-tazobactam (ZOSYN) IVPB 3.375 g  Status:  Discontinued        3.375 g 12.5 mL/hr over 240 Minutes Intravenous Every 8 hours 08/25/21 1338 08/25/21 1700      Culture/Microbiology No results found for: "SDES", "SPECREQUEST", "CULT", "REPTSTATUS"  Other culture-see note  Radiology Studies: MR Lumbar Spine W Wo Contrast  Result Date: 08/25/2021 CLINICAL DATA:  Worsening back and right leg pain. History of metastatic lung cancer. EXAM: MRI THORACIC AND LUMBAR SPINE WITHOUT AND WITH CONTRAST TECHNIQUE: Multiplanar and multiecho  pulse sequences of the thoracic and lumbar spine were obtained without and with intravenous contrast. CONTRAST:  23mL GADAVIST GADOBUTROL 1 MMOL/ML IV SOLN COMPARISON:  CT lumbar myelogram  dated August 20, 2021. MRI thoracic and lumbar spine dated Jul 30, 2021. FINDINGS: MRI THORACIC SPINE FINDINGS Alignment:  Physiologic. Vertebrae: T3, T6, T8, and T11 metastases have increased in size. New tiny lesions at T10 and T12 (series 17, images 5 and 7). No epidural tumor extension. No fracture or evidence of discitis. Cord: Normal signal and morphology. No abnormal intrathecal enhancement. Paraspinal and other soft tissues: Negative. Disc levels: Unchanged small right paracentral disc protrusions at T1-T2 and T2-T3. No spinal canal or neuroforaminal stenosis at any level. MRI LUMBAR SPINE FINDINGS Segmentation:  Standard. Alignment:  Unchanged mild retrolisthesis at L3-L4. Vertebrae: L4 and S2 vertebral body metastases have increased in size. New L1, L2, L3, S1, and S3 vertebral body metastases. No epidural tumor extension. No fracture or evidence of discitis. Conus medullaris: Extends to the L1-L2 level and appears normal. No abnormal intrathecal enhancement. Paraspinal and other soft tissues: Enlarging metastases in the bilateral posterior iliac bones. Enlarging upper abdominal lymphadenopathy. Disc levels: T12-L1:  Negative. L1-L2:  Negative. L2-L3: Negative disc. Unchanged mild bilateral facet arthropathy. No stenosis. L3-L4: Unchanged mild disc bulging with superimposed right subarticular disc protrusion. Unchanged mild bilateral facet arthropathy. Unchanged mild spinal canal stenosis. Unchanged moderate right and mild left lateral recess stenosis. Unchanged mild bilateral neuroforaminal stenosis. L4-L5: Prior left hemilaminectomy. Unchanged mild disc bulging with superimposed right extraforaminal disc protrusion contacting the exiting right L4 nerve root. Unchanged mild right lateral recess and bilateral neuroforaminal  stenosis. No spinal canal stenosis. L5-S1: Unchanged small left paracentral disc protrusion and mild bilateral facet arthropathy. No stenosis. IMPRESSION: 1. New and progressive thoracic and lumbosacral osseous metastatic disease without significant extraosseous extension or pathologic fracture. 2. Enlarging bilateral iliac bone and upper abdominal nodal metastatic disease. 3. Unchanged multilevel lumbar spondylosis as described above. Right extraforaminal disc protrusion contacts and potentially irritates the exiting right L4 nerve root. Electronically Signed   By: Titus Dubin M.D.   On: 08/25/2021 14:55   MR THORACIC SPINE W WO CONTRAST  Result Date: 08/25/2021 CLINICAL DATA:  Worsening back and right leg pain. History of metastatic lung cancer. EXAM: MRI THORACIC AND LUMBAR SPINE WITHOUT AND WITH CONTRAST TECHNIQUE: Multiplanar and multiecho pulse sequences of the thoracic and lumbar spine were obtained without and with intravenous contrast. CONTRAST:  58mL GADAVIST GADOBUTROL 1 MMOL/ML IV SOLN COMPARISON:  CT lumbar myelogram dated August 20, 2021. MRI thoracic and lumbar spine dated Jul 30, 2021. FINDINGS: MRI THORACIC SPINE FINDINGS Alignment:  Physiologic. Vertebrae: T3, T6, T8, and T11 metastases have increased in size. New tiny lesions at T10 and T12 (series 17, images 5 and 7). No epidural tumor extension. No fracture or evidence of discitis. Cord: Normal signal and morphology. No abnormal intrathecal enhancement. Paraspinal and other soft tissues: Negative. Disc levels: Unchanged small right paracentral disc protrusions at T1-T2 and T2-T3. No spinal canal or neuroforaminal stenosis at any level. MRI LUMBAR SPINE FINDINGS Segmentation:  Standard. Alignment:  Unchanged mild retrolisthesis at L3-L4. Vertebrae: L4 and S2 vertebral body metastases have increased in size. New L1, L2, L3, S1, and S3 vertebral body metastases. No epidural tumor extension. No fracture or evidence of discitis. Conus medullaris:  Extends to the L1-L2 level and appears normal. No abnormal intrathecal enhancement. Paraspinal and other soft tissues: Enlarging metastases in the bilateral posterior iliac bones. Enlarging upper abdominal lymphadenopathy. Disc levels: T12-L1:  Negative. L1-L2:  Negative. L2-L3: Negative disc. Unchanged mild bilateral facet arthropathy. No stenosis. L3-L4: Unchanged mild disc bulging with superimposed right subarticular disc protrusion.  Unchanged mild bilateral facet arthropathy. Unchanged mild spinal canal stenosis. Unchanged moderate right and mild left lateral recess stenosis. Unchanged mild bilateral neuroforaminal stenosis. L4-L5: Prior left hemilaminectomy. Unchanged mild disc bulging with superimposed right extraforaminal disc protrusion contacting the exiting right L4 nerve root. Unchanged mild right lateral recess and bilateral neuroforaminal stenosis. No spinal canal stenosis. L5-S1: Unchanged small left paracentral disc protrusion and mild bilateral facet arthropathy. No stenosis. IMPRESSION: 1. New and progressive thoracic and lumbosacral osseous metastatic disease without significant extraosseous extension or pathologic fracture. 2. Enlarging bilateral iliac bone and upper abdominal nodal metastatic disease. 3. Unchanged multilevel lumbar spondylosis as described above. Right extraforaminal disc protrusion contacts and potentially irritates the exiting right L4 nerve root. Electronically Signed   By: Titus Dubin M.D.   On: 08/25/2021 14:55   CT ABDOMEN PELVIS W CONTRAST  Result Date: 08/25/2021 CLINICAL DATA:  61 year old with acute abdominal pain. History of metastatic lung cancer. EXAM: CT ABDOMEN AND PELVIS WITH CONTRAST TECHNIQUE: Multidetector CT imaging of the abdomen and pelvis was performed using the standard protocol following bolus administration of intravenous contrast. RADIATION DOSE REDUCTION: This exam was performed according to the departmental dose-optimization program which  includes automated exposure control, adjustment of the mA and/or kV according to patient size and/or use of iterative reconstruction technique. CONTRAST:  110mL OMNIPAQUE IOHEXOL 300 MG/ML  SOLN COMPARISON:  CTA chest, abdomen and pelvis 05/27/2021 and CT of the chest, abdomen and pelvis on 06/18/2021 and PET/CT 06/22/2021. Thoracic and lumbar MRI 07/30/2021. Abdominal ultrasound 08/24/2021. FINDINGS: Lower chest: Again noted are small pulmonary nodules in the right lower lobe. There is an dominant nodule in the right lower lobe that measures approximately 6 mm minimally changed since 05/27/2021. However, there is a new punctate nodule in the medial right lower lobe on sequence 6, image 25. In addition, there appears to be a new 3 mm nodule in the right lower lobe on sequence 6 image 7. New punctate nodule in the right middle lobe on image 22. Findings are suggestive for small lung metastases. Hepatobiliary: There are innumerable low-density lesions scattered throughout the liver of which are new and suggestive for hepatic metastatic disease. In addition, there is diffuse narrowing of the main right portal vein and evidence for thrombus involving right intrahepatic portal venous branches. The main and left intrahepatic portal veins appear to be patent. Poorly defined low-density structures throughout the right hepatic lobe are most with metastatic disease, most prominent in segment 8. Gallbladder is mildly distended. Mild pericholecystic fluid associated with the gallbladder. Difficult to exclude intrahepatic biliary dilatation in the right hepatic lobe. Pancreas: Unremarkable. No pancreatic ductal dilatation or surrounding inflammatory changes. Spleen: There are least 2 small low-density structures in the spleen that are stable. Spleen is normal in size. Adrenals/Urinary Tract: Normal adrenal glands. Normal appearance of both kidneys without hydronephrosis. No suspicious renal lesions. No definite kidney stones.  Normal appearance of the urinary bladder. Stomach/Bowel: Stomach is within normal limits. Appendix appears normal. No evidence of bowel wall thickening, distention, or inflammatory changes. Vascular/Lymphatic: Atherosclerotic disease in the abdominal aorta without aneurysm. Main visceral arteries are patent. Enlarged low-density lymph node in the upper abdominal pre caval region on sequence 2 image 29. This precaval lymph node measures 1.5 cm in short axis and previously measured 1.3 cm on 06/22/2021. Enlarged retroperitoneal lymph nodes around the IVC. There is another index lymph node between the IVC and the aorta on image 34 that measures 1.0 cm in short axis. Thrombosis of right  portal vein branches. Narrowing of the main right portal vein appears new. Main portal vein and portal vein confluence appear to be patent although the main portal vein is adjacent to the enlarged precaval lymph node. Reproductive: Prostate is unremarkable. Other: No significant ascites other than the minimal fluid around the gallbladder. Umbilical hernia containing fat. Bilateral inguinal hernias containing fat. Musculoskeletal: Expansile lytic lesion involving the anterior aspect of the right iliac wing that measures 3.4 x 3.1 cm and markedly enlarged since 06/18/2021. Again noted is lucency along the anterior L4 vertebral body and suggestive for a lucent bone lesion. There is also a lesion along the posteroinferior aspect of the T11 vertebral body suggestive for bone metastasis. Again noted is a lucent lesion involving the left ilium on sequence 2 image 54. IMPRESSION: 1. Imaging findings are compatible with progression of metastatic disease. New hepatic metastasis with new small pulmonary nodules, enlarged upper abdominal lymph nodes and progression of bone disease. 2. Significant change in the appearance of the liver with innumerable hepatic lesions. The disease is most prominent in the right hepatic lobe and there is evidence for  thrombosed right portal vein branches. The main right portal vein is also narrowed compared to the previous examination. 3. Small amount of pericholecystic fluid is nonspecific. Gallbladder is mildly distended. Findings are similar to the recent ultrasound findings. These findings could be associated with the hepatic disease. Ultrasound and CT findings are equivocal for cholecystitis and consider further evaluation of the gallbladder with a HIDA scan (if clinically indicated). 4. Scattered bony lesions compatible with metastatic disease. Enlargement of a lytic expansile lesion involving the right iliac wing. 5. Umbilical hernia.  Bilateral inguinal hernias. 6.  Aortic Atherosclerosis (ICD10-I70.0). These results were called by telephone at the time of interpretation on 08/25/2021 at 11:24 am to provider [facility] , who verbally acknowledged these results. Electronically Signed   By: Markus Daft M.D.   On: 08/25/2021 11:24   CT L-SPINE NO CHARGE  Result Date: 08/25/2021 CLINICAL DATA:  Worsening back and right leg pain. History of metastatic lung cancer. EXAM: CT LUMBAR SPINE WITHOUT CONTRAST TECHNIQUE: Multidetector CT imaging of the lumbar spine was performed without intravenous contrast administration. Multiplanar CT image reconstructions were also generated. RADIATION DOSE REDUCTION: This exam was performed according to the departmental dose-optimization program which includes automated exposure control, adjustment of the mA and/or kV according to patient size and/or use of iterative reconstruction technique. COMPARISON:  CT lumbar myelogram dated August 20, 2021. MRI thoracic and lumbar spine dated Jul 30, 2021. FINDINGS: Segmentation: 5 lumbar type vertebrae. Alignment: Unchanged levoscoliosis. Unchanged mild retrolisthesis at L3-L4. Vertebrae: Small lytic lesions in the T11 and L4 vertebral bodies. No acute fracture. Paraspinal and other soft tissues: Please see separate CT abdomen pelvis report from same  day. Incompletely visualized diffuse hepatic metastatic disease. Portacaval and aortocaval lymphadenopathy. Unchanged lytic lesion in the left iliac bone. Aortoiliac atherosclerotic vascular disease. Disc levels: Multilevel degenerative changes are unchanged since lumbar myelogram 5 days ago, including a right extraforaminal disc protrusion at L4-L5 closely approximating and potentially irritating the right L4 nerve root. IMPRESSION: 1. No acute osseous abnormality. 2. Unchanged bony metastases in the T11 and L4 vertebral bodies and left iliac bone. 3. Incompletely visualized intra-abdominal metastatic disease involving the liver and upper abdominal lymph nodes. 4. Aortic Atherosclerosis (ICD10-I70.0). Electronically Signed   By: Titus Dubin M.D.   On: 08/25/2021 10:56   US Abdomen Limited  Result Date: 08/24/2021 CLINICAL DATA:  Elevated LFTs  with right-sided abdominal pain. History of metastatic lung cancer. EXAM: ULTRASOUND ABDOMEN LIMITED RIGHT UPPER QUADRANT COMPARISON:  PET-CT June 22, 2021 FINDINGS: Gallbladder: Biliary sludge with gallbladder calculi measuring up to 3 mm. Wall thickening measuring up to 8 mm with some pericholecystic fluid. No sonographic Murphy sign noted by sonographer. Common bile duct: Diameter: 9 mm Liver: Heterogeneous appearance of the hepatic parenchyma with multiple small hepatic lesions identified measuring up to 1.6 cm. Portal vein is patent on color Doppler imaging with normal direction of blood flow towards the liver. Other: None. IMPRESSION: 1. Heterogeneous appearance of the hepatic parenchyma with multiple small hepatic lesions identified, suspicious for metastatic disease. 2. Cholelithiasis and sludge with gallbladder wall thickening measuring up to 8 mm but with a negative sonographic Murphy sign. Findings which are favored to reflect sequela of hepatic disease. If continued clinical concern for cholecystitis recommend further evaluation with nuclear medicine HIDA  scan. 3. Prominence of the common bile duct measuring 9 mm, recommend correlation with laboratory values to assess for biliary obstruction. Electronically Signed   By: Dahlia Bailiff M.D.   On: 08/24/2021 15:31     LOS: 0 days   Antonieta Pert, MD [facility]  08/26/2021, 10:22 AM

## 2021-08-26 NOTE — Consult Note (Addendum)
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  He presented to the emergency department with back and abdominal pain.  He reports sharp back pain that radiates to his chest, abdomen, right lower extremity.  He was recently admitted to [facility] for intractable pain.  Home pain medication regimen was not fully effective for him.  CT abdomen/pelvis with contrast was performed which showed progression of metastatic disease with new hepatic metastasis and new small pulmonary nodule, enlarged upper abdominal lymph nodes, and progression of bone disease, significant change in the appearance of the liver with innumerable hepatic lesions, the disease is most prominent in the right hepatic lobe and there is evidence for thrombosed right portal vein branches, scattered bony lesions compatible with metastatic disease with enlargement of a lytic expansile lesion involving the right iliac wing.  MRI of the thoracic and lumbar spine was also performed which showed new and progressive thoracic and lumbosacral osseous metastatic disease without significant extraosseous extension or pathologic fracture, enlarging bilateral iliac bone and upper abdominal nodal metastatic disease.  The patient has been seen by palliative care who discussed goals of care with the patient.  He has not made any decisions at this time.  He has been started on dexamethasone IV as well as IV hydromorphone and oral methadone.  They are considering him for short-term ketamine infusion.  Case was also discussed with Dr. Isidore Moos of radiation oncology for consideration of radiation to  the right iliac crest but they did not feel it would make a notable difference in his current pain situation and there is no urgency for radiation.  Osteocool RF also being considered.  The patient was resting comfortably at the time my visit.  He had just received pain medication.  Pain is currently controlled since receiving the pain medication.  He reports an ongoing poor appetite and mild nausea.  He is not having any vomiting today.  He is not having any fevers or chills.  He currently denies chest pain or shortness of breath.  Bruises easily but no active bleeding.  Medical oncology was asked see the patient make recommendations regarding his metastatic lung cancer.  Past Medical History:  Diagnosis Date   Chronic back pain    Chronic GERD    Chronic pain    COPD (chronic obstructive pulmonary disease) (HCC)    Coronary artery disease    Depression    Diabetes mellitus without complication ([place])    Gastroparesis    Goals of care, counseling/discussion 01/27/2021   High risk medication use    Hyperlipidemia    MI (myocardial infarction) ([place])    Non-small cell lung cancer metastatic to brain ([name]) 01/27/2021   Restless leg syndrome   :   Past Surgical History:  Procedure Laterality Date   BACK SURGERY     CARDIAC CATHETERIZATION    :   Current Facility-Administered Medications  Medication Dose Route Frequency Provider Last Rate Last Admin   0.9 %  sodium chloride infusion   Intravenous Continuous Kyle, Tyrone A, DO 75 mL/hr at 08/25/21 2151 New Bag at 08/25/21 2151   albuterol (PROVENTIL) (2.5 MG/3ML) 0.083% nebulizer solution 2.5 mg  2.5 mg Nebulization Q4H  PRN Cherylann Ratel A, DO       cyclobenzaprine (FLEXERIL) tablet 5 mg  5 mg Oral TID Cherylann Ratel A, DO   5 mg at 08/25/21 2047   dexamethasone (DECADRON) injection 8 mg  8 mg Intravenous Q24H Lane Hacker L, DO   8 mg at 08/25/21 1952   ferumoxytol (FERAHEME) 510 mg in sodium chloride 0.9 % 100 mL IVPB  510 mg  Intravenous Once [name], MD       heparin ADULT infusion 100 units/mL (25000 units/226mL)  1,550 Units/hr Intravenous Continuous Dimple Nanas, RPH 15.5 mL/hr at 08/26/21 0757 1,550 Units/hr at 08/26/21 0757   HYDROmorphone (DILAUDID) injection 4 mg  4 mg Intravenous Q3H PRN [name], MD   4 mg at 08/26/21 0800   insulin aspart (novoLOG) injection 0-9 Units  0-9 Units Subcutaneous TID WC Kyle, Tyrone A, DO   1 Units at 08/26/21 0908   ketorolac (TORADOL) 30 MG/ML injection 30 mg  30 mg Intravenous Q8H [name], MD   30 mg at 08/26/21 0827   methadone (DOLOPHINE) tablet 10 mg  10 mg Oral Q8H Lane Hacker L, DO   10 mg at 08/26/21 0516   metoCLOPramide (REGLAN) injection 5 mg  5 mg Intravenous Q6H Lane Hacker L, DO   5 mg at 08/26/21 0516   mometasone-formoterol (DULERA) 200-5 MCG/ACT inhaler 2 puff  2 puff Inhalation BID Marylyn Ishihara, Tyrone A, DO   2 puff at 08/26/21 0849   montelukast (SINGULAIR) tablet 10 mg  10 mg Oral QHS Kyle, Tyrone A, DO   10 mg at 08/26/21 0013   ondansetron (ZOFRAN) injection 4 mg  4 mg Intravenous Q6H PRN Marylyn Ishihara, Tyrone A, DO   4 mg at 08/26/21 7341   oxyCODONE (Oxy IR/ROXICODONE) immediate release tablet 5 mg  5 mg Oral Q6H PRN Kc, Maren Beach, MD       pregabalin (LYRICA) capsule 75 mg  75 mg Oral TID [name], MD          Allergies  Allergen Reactions   Gabapentin Swelling    Leg swelling    Levetiracetam Other (See Comments)    Personality changes   Pregabalin Swelling    Leg swelling    Bupropion Nausea And Vomiting   Duloxetine Other (See Comments)    Excessive sleeping   Ezetimibe Other (See Comments)    Unknown reaction   Other Other (See Comments)    Antibiotics - per wife at bedside, patient will develop C-diff if given "any antibiotic"   Prochlorperazine Nausea And Vomiting   Lamotrigine Itching and Rash  :  History reviewed. No pertinent family history.:   Social History   Socioeconomic History    Marital status: Married    Spouse name: Not on file   Number of children: Not on file   Years of education: Not on file   Highest education level: Not on file  Occupational History   Not on file  Tobacco Use   Smoking status: Former    Packs/day: 1.00    Years: 40.00    Total pack years: 40.00    Types: Cigarettes    Quit date: 07/20/2020    Years since quitting: 1.1   Smokeless tobacco: Never  Vaping Use   Vaping Use: Never used  Substance and Sexual Activity   Alcohol use: Not Currently   Drug use: Not Currently   Sexual activity: Not on file  Other Topics Concern   Not on  file  Social History Narrative   Not on file   Social Determinants of Health   Financial Resource Strain: Not on file  Food Insecurity: Not on file  Transportation Needs: Not on file  Physical Activity: Not on file  Stress: Not on file  Social Connections: Not on file  Intimate Partner Violence: Not on file  :  Review of Systems: A comprehensive 14 point review of systems was negative except as noted in the HPI.  Exam: Patient Vitals for the past 24 hrs:  BP Temp Temp src Pulse Resp SpO2  08/26/21 0359 (!) 178/98 98 F (36.7 C) Oral 100 16 97 %  08/25/21 2342 (!) 178/99 97.9 F (36.6 C) Oral 95 16 98 %  08/25/21 2014 (!) 153/99 97.7 F (36.5 C) -- 93 17 99 %  08/25/21 1530 (!) 142/85 97.7 F (36.5 C) -- 77 20 98 %  08/25/21 1445 136/84 98 F (36.7 C) -- 80 15 97 %  08/25/21 1245 123/90 98 F (36.7 C) -- 77 14 96 %  08/25/21 1200 (!) 142/87 -- -- 80 -- 97 %  08/25/21 1156 (!) 133/92 -- -- 78 14 97 %  08/25/21 1045 (!) 152/95 -- -- 84 16 97 %    General: Laying in bed, resting quietly, no distress. Eyes:  no scleral icterus.   ENT:  There were no oropharyngeal lesions.     Respiratory: lungs were clear bilaterally without wheezing or crackles.   Cardiovascular: Regular rate and rhythm.    GI: Positive bowel sounds, soft.  Skin: No rashes, scattered ecchymoses on his bilateral  arms. Neuro exam was nonfocal. Patient was alert and oriented.    Lab Results  Component Value Date   WBC 9.9 08/26/2021   HGB 10.3 (L) 08/26/2021   HCT 32.1 (L) 08/26/2021   PLT 523 (H) 08/26/2021   GLUCOSE 127 (H) 08/26/2021   ALT 22 08/26/2021   AST 25 08/26/2021   NA 136 08/26/2021   K 3.3 (L) 08/26/2021   CL 107 08/26/2021   CREATININE 0.49 (L) 08/26/2021   BUN 7 08/26/2021   CO2 19 (L) 08/26/2021    MR Lumbar Spine W Wo Contrast  Result Date: 08/25/2021 CLINICAL DATA:  Worsening back and right leg pain. History of metastatic lung cancer. EXAM: MRI THORACIC AND LUMBAR SPINE WITHOUT AND WITH CONTRAST TECHNIQUE: Multiplanar and multiecho pulse sequences of the thoracic and lumbar spine were obtained without and with intravenous contrast. CONTRAST:  55mL GADAVIST GADOBUTROL 1 MMOL/ML IV SOLN COMPARISON:  CT lumbar myelogram dated August 20, 2021. MRI thoracic and lumbar spine dated Jul 30, 2021. FINDINGS: MRI THORACIC SPINE FINDINGS Alignment:  Physiologic. Vertebrae: T3, T6, T8, and T11 metastases have increased in size. New tiny lesions at T10 and T12 (series 17, images 5 and 7). No epidural tumor extension. No fracture or evidence of discitis. Cord: Normal signal and morphology. No abnormal intrathecal enhancement. Paraspinal and other soft tissues: Negative. Disc levels: Unchanged small right paracentral disc protrusions at T1-T2 and T2-T3. No spinal canal or neuroforaminal stenosis at any level. MRI LUMBAR SPINE FINDINGS Segmentation:  Standard. Alignment:  Unchanged mild retrolisthesis at L3-L4. Vertebrae: L4 and S2 vertebral body metastases have increased in size. New L1, L2, L3, S1, and S3 vertebral body metastases. No epidural tumor extension. No fracture or evidence of discitis. Conus medullaris: Extends to the L1-L2 level and appears normal. No abnormal intrathecal enhancement. Paraspinal and other soft tissues: Enlarging metastases in the bilateral posterior iliac bones.  Enlarging  upper abdominal lymphadenopathy. Disc levels: T12-L1:  Negative. L1-L2:  Negative. L2-L3: Negative disc. Unchanged mild bilateral facet arthropathy. No stenosis. L3-L4: Unchanged mild disc bulging with superimposed right subarticular disc protrusion. Unchanged mild bilateral facet arthropathy. Unchanged mild spinal canal stenosis. Unchanged moderate right and mild left lateral recess stenosis. Unchanged mild bilateral neuroforaminal stenosis. L4-L5: Prior left hemilaminectomy. Unchanged mild disc bulging with superimposed right extraforaminal disc protrusion contacting the exiting right L4 nerve root. Unchanged mild right lateral recess and bilateral neuroforaminal stenosis. No spinal canal stenosis. L5-S1: Unchanged small left paracentral disc protrusion and mild bilateral facet arthropathy. No stenosis. IMPRESSION: 1. New and progressive thoracic and lumbosacral osseous metastatic disease without significant extraosseous extension or pathologic fracture. 2. Enlarging bilateral iliac bone and upper abdominal nodal metastatic disease. 3. Unchanged multilevel lumbar spondylosis as described above. Right extraforaminal disc protrusion contacts and potentially irritates the exiting right L4 nerve root. Electronically Signed   By: Titus Dubin M.D.   On: 08/25/2021 14:55   MR THORACIC SPINE W WO CONTRAST  Result Date: 08/25/2021 CLINICAL DATA:  Worsening back and right leg pain. History of metastatic lung cancer. EXAM: MRI THORACIC AND LUMBAR SPINE WITHOUT AND WITH CONTRAST TECHNIQUE: Multiplanar and multiecho pulse sequences of the thoracic and lumbar spine were obtained without and with intravenous contrast. CONTRAST:  25mL GADAVIST GADOBUTROL 1 MMOL/ML IV SOLN COMPARISON:  CT lumbar myelogram dated August 20, 2021. MRI thoracic and lumbar spine dated Jul 30, 2021. FINDINGS: MRI THORACIC SPINE FINDINGS Alignment:  Physiologic. Vertebrae: T3, T6, T8, and T11 metastases have increased in size. New tiny lesions at T10  and T12 (series 17, images 5 and 7). No epidural tumor extension. No fracture or evidence of discitis. Cord: Normal signal and morphology. No abnormal intrathecal enhancement. Paraspinal and other soft tissues: Negative. Disc levels: Unchanged small right paracentral disc protrusions at T1-T2 and T2-T3. No spinal canal or neuroforaminal stenosis at any level. MRI LUMBAR SPINE FINDINGS Segmentation:  Standard. Alignment:  Unchanged mild retrolisthesis at L3-L4. Vertebrae: L4 and S2 vertebral body metastases have increased in size. New L1, L2, L3, S1, and S3 vertebral body metastases. No epidural tumor extension. No fracture or evidence of discitis. Conus medullaris: Extends to the L1-L2 level and appears normal. No abnormal intrathecal enhancement. Paraspinal and other soft tissues: Enlarging metastases in the bilateral posterior iliac bones. Enlarging upper abdominal lymphadenopathy. Disc levels: T12-L1:  Negative. L1-L2:  Negative. L2-L3: Negative disc. Unchanged mild bilateral facet arthropathy. No stenosis. L3-L4: Unchanged mild disc bulging with superimposed right subarticular disc protrusion. Unchanged mild bilateral facet arthropathy. Unchanged mild spinal canal stenosis. Unchanged moderate right and mild left lateral recess stenosis. Unchanged mild bilateral neuroforaminal stenosis. L4-L5: Prior left hemilaminectomy. Unchanged mild disc bulging with superimposed right extraforaminal disc protrusion contacting the exiting right L4 nerve root. Unchanged mild right lateral recess and bilateral neuroforaminal stenosis. No spinal canal stenosis. L5-S1: Unchanged small left paracentral disc protrusion and mild bilateral facet arthropathy. No stenosis. IMPRESSION: 1. New and progressive thoracic and lumbosacral osseous metastatic disease without significant extraosseous extension or pathologic fracture. 2. Enlarging bilateral iliac bone and upper abdominal nodal metastatic disease. 3. Unchanged multilevel lumbar  spondylosis as described above. Right extraforaminal disc protrusion contacts and potentially irritates the exiting right L4 nerve root. Electronically Signed   By: Titus Dubin M.D.   On: 08/25/2021 14:55   CT ABDOMEN PELVIS W CONTRAST  Result Date: 08/25/2021 CLINICAL DATA:  61 year old with acute abdominal pain. History of metastatic lung cancer. EXAM:  CT ABDOMEN AND PELVIS WITH CONTRAST TECHNIQUE: Multidetector CT imaging of the abdomen and pelvis was performed using the standard protocol following bolus administration of intravenous contrast. RADIATION DOSE REDUCTION: This exam was performed according to the departmental dose-optimization program which includes automated exposure control, adjustment of the mA and/or kV according to patient size and/or use of iterative reconstruction technique. CONTRAST:  164mL OMNIPAQUE IOHEXOL 300 MG/ML  SOLN COMPARISON:  CTA chest, abdomen and pelvis 05/27/2021 and CT of the chest, abdomen and pelvis on 06/18/2021 and PET/CT 06/22/2021. Thoracic and lumbar MRI 07/30/2021. Abdominal ultrasound 08/24/2021. FINDINGS: Lower chest: Again noted are small pulmonary nodules in the right lower lobe. There is an dominant nodule in the right lower lobe that measures approximately 6 mm minimally changed since 05/27/2021. However, there is a new punctate nodule in the medial right lower lobe on sequence 6, image 25. In addition, there appears to be a new 3 mm nodule in the right lower lobe on sequence 6 image 7. New punctate nodule in the right middle lobe on image 22. Findings are suggestive for small lung metastases. Hepatobiliary: There are innumerable low-density lesions scattered throughout the liver of which are new and suggestive for hepatic metastatic disease. In addition, there is diffuse narrowing of the main right portal vein and evidence for thrombus involving right intrahepatic portal venous branches. The main and left intrahepatic portal veins appear to be patent.  Poorly defined low-density structures throughout the right hepatic lobe are most with metastatic disease, most prominent in segment 8. Gallbladder is mildly distended. Mild pericholecystic fluid associated with the gallbladder. Difficult to exclude intrahepatic biliary dilatation in the right hepatic lobe. Pancreas: Unremarkable. No pancreatic ductal dilatation or surrounding inflammatory changes. Spleen: There are least 2 small low-density structures in the spleen that are stable. Spleen is normal in size. Adrenals/Urinary Tract: Normal adrenal glands. Normal appearance of both kidneys without hydronephrosis. No suspicious renal lesions. No definite kidney stones. Normal appearance of the urinary bladder. Stomach/Bowel: Stomach is within normal limits. Appendix appears normal. No evidence of bowel wall thickening, distention, or inflammatory changes. Vascular/Lymphatic: Atherosclerotic disease in the abdominal aorta without aneurysm. Main visceral arteries are patent. Enlarged low-density lymph node in the upper abdominal pre caval region on sequence 2 image 29. This precaval lymph node measures 1.5 cm in short axis and previously measured 1.3 cm on 06/22/2021. Enlarged retroperitoneal lymph nodes around the IVC. There is another index lymph node between the IVC and the aorta on image 34 that measures 1.0 cm in short axis. Thrombosis of right portal vein branches. Narrowing of the main right portal vein appears new. Main portal vein and portal vein confluence appear to be patent although the main portal vein is adjacent to the enlarged precaval lymph node. Reproductive: Prostate is unremarkable. Other: No significant ascites other than the minimal fluid around the gallbladder. Umbilical hernia containing fat. Bilateral inguinal hernias containing fat. Musculoskeletal: Expansile lytic lesion involving the anterior aspect of the right iliac wing that measures 3.4 x 3.1 cm and markedly enlarged since 06/18/2021. Again  noted is lucency along the anterior L4 vertebral body and suggestive for a lucent bone lesion. There is also a lesion along the posteroinferior aspect of the T11 vertebral body suggestive for bone metastasis. Again noted is a lucent lesion involving the left ilium on sequence 2 image 54. IMPRESSION: 1. Imaging findings are compatible with progression of metastatic disease. New hepatic metastasis with new small pulmonary nodules, enlarged upper abdominal lymph nodes and progression of  bone disease. 2. Significant change in the appearance of the liver with innumerable hepatic lesions. 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 The main right portal vein is also narrowed compared to the previous examination. 3. Small amount of pericholecystic fluid is nonspecific. Gallbladder is mildly distended. Findings are similar to the recent ultrasound findings. These findings could be associated with the hepatic disease. Ultrasound and CT findings are equivocal for cholecystitis and consider further evaluation of the gallbladder with a HIDA scan (if clinically indicated). 4. Scattered bony lesions compatible with metastatic disease. Enlargement of a lytic expansile lesion involving the right iliac wing. 5. Umbilical hernia.  Bilateral inguinal hernias. 6.  Aortic Atherosclerosis (ICD10-I70.0). These results were called by telephone at the time of interpretation on 08/25/2021 at 11:24 am to provider [facility] , who verbally acknowledged these results. Electronically Signed   By: Markus Daft M.D.   On: 08/25/2021 11:24   CT L-SPINE NO CHARGE  Result Date: 08/25/2021 CLINICAL DATA:  Worsening back and right leg pain. History of metastatic lung cancer. EXAM: CT LUMBAR SPINE WITHOUT CONTRAST TECHNIQUE: Multidetector CT imaging of the lumbar spine was performed without intravenous contrast administration. Multiplanar CT image reconstructions were also  generated. RADIATION DOSE REDUCTION: This exam was performed according to the departmental dose-optimization program which includes automated exposure control, adjustment of the mA and/or kV according to patient size and/or use of iterative reconstruction technique. COMPARISON:  CT lumbar myelogram dated August 20, 2021. MRI thoracic and lumbar spine dated Jul 30, 2021. FINDINGS: Segmentation: 5 lumbar type vertebrae. Alignment: Unchanged levoscoliosis. Unchanged mild retrolisthesis at L3-L4. Vertebrae: Small lytic lesions in the T11 and L4 vertebral bodies. No acute fracture. Paraspinal and other soft tissues: Please see separate CT abdomen pelvis report from same day. Incompletely visualized diffuse hepatic metastatic disease. Portacaval and aortocaval lymphadenopathy. Unchanged lytic lesion in the left iliac bone. Aortoiliac atherosclerotic vascular disease. Disc levels: Multilevel degenerative changes are unchanged since lumbar myelogram 5 days ago, including a right extraforaminal disc protrusion at L4-L5 closely approximating and potentially irritating the right L4 nerve root. IMPRESSION: 1. No acute osseous abnormality. 2. Unchanged bony metastases in the T11 and L4 vertebral bodies and left iliac bone. 3. Incompletely visualized intra-abdominal metastatic disease involving the liver and upper abdominal lymph nodes. 4. Aortic Atherosclerosis (ICD10-I70.0). Electronically Signed   By: Titus Dubin M.D.   On: 08/25/2021 10:56   US Abdomen Limited  Result Date: 08/24/2021 CLINICAL DATA:  Elevated LFTs with right-sided abdominal pain. History of metastatic lung cancer. EXAM: ULTRASOUND ABDOMEN LIMITED RIGHT UPPER QUADRANT COMPARISON:  PET-CT June 22, 2021 FINDINGS: Gallbladder: Biliary sludge with gallbladder calculi measuring up to 3 mm. Wall thickening measuring up to 8 mm with some pericholecystic fluid. No sonographic Murphy sign noted by sonographer. Common bile duct: Diameter: 9 mm Liver: Heterogeneous  appearance of the hepatic parenchyma with multiple small hepatic lesions identified measuring up to 1.6 cm. Portal vein is patent on color Doppler imaging with normal direction of blood flow towards the liver. Other: None. IMPRESSION: 1. Heterogeneous appearance of the hepatic parenchyma with multiple small hepatic lesions identified, suspicious for metastatic disease. 2. Cholelithiasis and sludge with gallbladder wall thickening measuring up to 8 mm but with a negative sonographic Murphy sign. Findings which are favored to reflect sequela of hepatic disease. If continued clinical concern for cholecystitis recommend further evaluation with nuclear medicine HIDA scan. 3. Prominence of the common  bile duct measuring 9 mm, recommend correlation with laboratory values to assess for biliary obstruction. Electronically Signed   By: Dahlia Bailiff M.D.   On: 08/24/2021 15:31   DG MYELOGRAPHY LUMBAR INJ LUMBOSACRAL  Result Date: 08/20/2021 CLINICAL DATA:  Disc displacement, lumbar. Right lower extremity radiculopathy extending to lateral anterior thigh without relief from L4-5 epidural steroid injection. EXAM: LUMBAR MYELOGRAM FLUOROSCOPY: Radiation Exposure Index (as provided by the fluoroscopic device): Dose area product 716.04 uGy*m2 PROCEDURE: After thorough discussion of risks and benefits of the procedure including bleeding, infection, injury to nerves, blood vessels, adjacent structures as well as headache and CSF leak, written and oral informed consent was obtained. Consent was obtained by Dr. San Morelle. Time out form was completed. Patient was positioned prone on the fluoroscopy table. Local anesthesia was provided with 1% lidocaine without epinephrine after prepped and draped in the usual sterile fashion. Puncture was performed at L2-3 using a 3 1/2 inch 22-gauge spinal needle via left paramidline approach. Using a single pass through the dura, the needle was placed within the thecal sac, with return  of clear CSF. 15 mL of Isovue M-200 was injected into the thecal sac, with normal opacification of the nerve roots and cauda equina consistent with free flow within the subarachnoid space. I personally performed the lumbar puncture and administered the intrathecal contrast. I also personally supervised acquisition of the myelogram images. TECHNIQUE: Contiguous axial images were obtained through the Lumbar spine after the intrathecal infusion of infusion. Coronal and sagittal reconstructions were obtained of the axial image sets. COMPARISON:  MRI of the lumbar spine without contrast 07/30/2021 FINDINGS: LUMBAR MYELOGRAM FINDINGS: Five non rib-bearing lumbar type vertebral bodies are present. Subtle changes are noted at the known L4 lesion. Vertebral body heights are maintained. Slight retrolisthesis is present at L3-4. This is slightly worse with extension. No other significant listhesis is present. Atherosclerotic calcifications are present in the aorta without aneurysm. Broad-based disc protrusion is present at L3-4. This results in moderate central canal stenosis subarticular narrowing bilaterally with right subarticular narrowing. Right subarticular narrowing is also present at L4-5 secondary to a broad-based disc protrusion. The nerve roots otherwise fill normally on both sides. CT LUMBAR MYELOGRAM FINDINGS: Lumbar spine is imaged from T12 through S2. Vertebral body heights are maintained. Lucent lesion anteriorly at L4 measures 9 mm cephalo caudad, compatible with the suspected metastasis. Multiple lucent lesions are again noted within the left iliac bone. There is destruction of the medial cortex. Atherosclerotic calcifications are present within the aorta and branch vessels. Visualized abdomen is otherwise unremarkable. No significant retroperitoneal adenopathy is present. Mild leftward curvature of the lumbar spine is centered at L3-4. T12-L1: Negative. L1-2: Mild facet hypertrophy is present bilaterally. No  significant stenosis is present. L2-3: Mild facet hypertrophy is present. No significant disc protrusion or stenosis is present. L3-4: A broad-based disc protrusion is asymmetric to the right. A left-sided synovial cyst creates some mass effect on the posterior canal. Right greater than left subarticular narrowing is present. Moderate foraminal narrowing is worse right than left. L4-5: A broad-based disc protrusion is again seen. Left laminectomy noted. The canal is decompressed posteriorly on the left. Facet spurring and disc protrusions result in moderate foraminal stenosis, right greater than left. Mild subarticular narrowing is worse right than left. L5-S1: A shallow central disc protrusion is present without significant stenosis. IMPRESSION: 1. Left laminectomy at L4-5 with decompression of the canal posteriorly on the left. 2. Moderate foraminal stenosis bilaterally at L4-5 is worse  on the right. 3. Mild subarticular narrowing at L4-5 is worse right than left. 4. Moderate central canal stenosis at L3-4 with right greater than left subarticular narrowing. 5. Mild retrolisthesis at L3-4 is slightly exacerbated with extension. 6. Moderate foraminal narrowing bilaterally at L3-4 is worse on the right. 7. Suspected metastases at L4 and in the left iliac bone. 8. Mild facet hypertrophy at L1-2 and L2-3 without significant stenosis. 9. Aortic Atherosclerosis (ICD10-I70.0). Electronically Signed   By: San Morelle M.D.   On: 08/20/2021 12:13   CT LUMBAR SPINE W CONTRAST  Result Date: 08/20/2021 CLINICAL DATA:  Disc displacement, lumbar. Right lower extremity radiculopathy extending to lateral anterior thigh without relief from L4-5 epidural steroid injection. EXAM: LUMBAR MYELOGRAM FLUOROSCOPY: Radiation Exposure Index (as provided by the fluoroscopic device): Dose area product 716.04 uGy*m2 PROCEDURE: After thorough discussion of risks and benefits of the procedure including bleeding, infection, injury to  nerves, blood vessels, adjacent structures as well as headache and CSF leak, written and oral informed consent was obtained. Consent was obtained by Dr. San Morelle. Time out form was completed. Patient was positioned prone on the fluoroscopy table. Local anesthesia was provided with 1% lidocaine without epinephrine after prepped and draped in the usual sterile fashion. Puncture was performed at L2-3 using a 3 1/2 inch 22-gauge spinal needle via left paramidline approach. Using a single pass through the dura, the needle was placed within the thecal sac, with return of clear CSF. 15 mL of Isovue M-200 was injected into the thecal sac, with normal opacification of the nerve roots and cauda equina consistent with free flow within the subarachnoid space. I personally performed the lumbar puncture and administered the intrathecal contrast. I also personally supervised acquisition of the myelogram images. TECHNIQUE: Contiguous axial images were obtained through the Lumbar spine after the intrathecal infusion of infusion. Coronal and sagittal reconstructions were obtained of the axial image sets. COMPARISON:  MRI of the lumbar spine without contrast 07/30/2021 FINDINGS: LUMBAR MYELOGRAM FINDINGS: Five non rib-bearing lumbar type vertebral bodies are present. Subtle changes are noted at the known L4 lesion. Vertebral body heights are maintained. Slight retrolisthesis is present at L3-4. This is slightly worse with extension. No other significant listhesis is present. Atherosclerotic calcifications are present in the aorta without aneurysm. Broad-based disc protrusion is present at L3-4. This results in moderate central canal stenosis subarticular narrowing bilaterally with right subarticular narrowing. Right subarticular narrowing is also present at L4-5 secondary to a broad-based disc protrusion. The nerve roots otherwise fill normally on both sides. CT LUMBAR MYELOGRAM FINDINGS: Lumbar spine is imaged from T12  through S2. Vertebral body heights are maintained. Lucent lesion anteriorly at L4 measures 9 mm cephalo caudad, compatible with the suspected metastasis. Multiple lucent lesions are again noted within the left iliac bone. There is destruction of the medial cortex. Atherosclerotic calcifications are present within the aorta and branch vessels. Visualized abdomen is otherwise unremarkable. No significant retroperitoneal adenopathy is present. Mild leftward curvature of the lumbar spine is centered at L3-4. T12-L1: Negative. L1-2: Mild facet hypertrophy is present bilaterally. No significant stenosis is present. L2-3: Mild facet hypertrophy is present. No significant disc protrusion or stenosis is present. L3-4: A broad-based disc protrusion is asymmetric to the right. A left-sided synovial cyst creates some mass effect on the posterior canal. Right greater than left subarticular narrowing is present. Moderate foraminal narrowing is worse right than left. L4-5: A broad-based disc protrusion is again seen. Left laminectomy noted. The canal is decompressed  posteriorly on the left. Facet spurring and disc protrusions result in moderate foraminal stenosis, right greater than left. Mild subarticular narrowing is worse right than left. L5-S1: A shallow central disc protrusion is present without significant stenosis. IMPRESSION: 1. Left laminectomy at L4-5 with decompression of the canal posteriorly on the left. 2. Moderate foraminal stenosis bilaterally at L4-5 is worse on the right. 3. Mild subarticular narrowing at L4-5 is worse right than left. 4. Moderate central canal stenosis at L3-4 with right greater than left subarticular narrowing. 5. Mild retrolisthesis at L3-4 is slightly exacerbated with extension. 6. Moderate foraminal narrowing bilaterally at L3-4 is worse on the right. 7. Suspected metastases at L4 and in the left iliac bone. 8. Mild facet hypertrophy at L1-2 and L2-3 without significant stenosis. 9. Aortic  Atherosclerosis (ICD10-I70.0). Electronically Signed   By: San Morelle M.D.   On: 08/20/2021 12:13   MR Lumbar Spine W Wo Contrast  Result Date: 07/30/2021 CLINICAL DATA:  Low back pain, cancer suspected EXAM: MRI LUMBAR SPINE WITHOUT AND WITH CONTRAST TECHNIQUE: Multiplanar and multiecho pulse sequences of the lumbar spine were obtained without and with intravenous contrast. CONTRAST:  41mL GADAVIST GADOBUTROL 1 MMOL/ML IV SOLN COMPARISON:  None Available. FINDINGS: Segmentation: Standard segmentation is assumed. The inferior-most fully formed intervertebral disc labeled L5-S1. Alignment:  No substantial sagittal subluxation. Vertebrae: Approximately 1.8 cm T1 hypointense, stir hyperintense, enhancing lesion involving the anterior/superior L4 vertebral body, compatible with metastasis. Additional smaller (6 mm) metastasis involving the anterior/inferior S2 vertebral body. Multiple metastasis involving the left iliac bone, measuring up to 2.2 cm. No evidence of epidural extension of enhancing tumor. No specific evidence of pathologic fracture. Conus medullaris and cauda equina: Conus extends to the L2 level. Conus appears normal. No abnormal enhancement of the conus or cauda equina. Paraspinal and other soft tissues: No acute finding on limited visualization/assessment. Disc levels: T12-L1: No significant disc protrusion, foraminal stenosis, or canal stenosis. L1-L2: No significant disc protrusion, foraminal stenosis, or canal stenosis. L2-L3: Bilateral facet arthropathy without significant stenosis. L3-L4: Broad disc bulging with superimposed right subarticular/foraminal and left foraminal disc protrusions. Resulting mild bilateral foraminal stenosis, mild canal stenosis, and right greater than left subarticular recess narrowing. L4-L5: Bilateral facet arthropathy. Expected postsurgical changes on the left. Right eccentric disc bulging. Resulting mild bilateral foraminal stenosis. Right far  lateral/extraforaminal disc closely approximates the exiting right L5 nerve. Patent canal. L5-S1: Some left paracentral disc protrusion without significant stenosis. IMPRESSION: 1. Multiple osseous metastases involving the L4 and S2 vertebral bodies as well as the left sacrum, described above. No significant extraosseous extension or pathologic fracture. 2. Multilevel degenerative change, described above. Electronically Signed   By: Margaretha Sheffield M.D.   On: 07/30/2021 10:02   MR THORACIC SPINE W WO CONTRAST  Result Date: 07/30/2021 CLINICAL DATA:  Mid back pain, history of metastatic lung cancer EXAM: MRI THORACIC WITHOUT AND WITH CONTRAST TECHNIQUE: Multiplanar and multiecho pulse sequences of the thoracic spine were obtained without and with intravenous contrast. CONTRAST:  81mL GADAVIST GADOBUTROL 1 MMOL/ML IV SOLN COMPARISON:  Outside study 07/02/2021 FINDINGS: Alignment:  Stable. Vertebrae: Stable vertebral body heights with mid to lower thoracic spine degenerative endplate irregularity. Abnormal marrow signal and enhancement at the right posterior aspect of T11 has increased in size. Increased abnormal signal and enhancement at the inferior endplates of T6 and T8. Increased focus of abnormal signal and enhancement at the superior endplate of T3. Stable lesion at T9 probably reflects a hemangioma. Cord:  No abnormal  signal.  No abnormal intrathecal enhancement. Paraspinal and other soft tissues: Unremarkable. Disc levels: Stable minor degenerative changes without significant canal or foraminal stenosis. IMPRESSION: Increased foci of abnormal signal and enhancement including involvement of T3, T6, T8, and T11 consistent with metastatic disease. There is no significant extraosseous extension. No pathologic fracture. Electronically Signed   By: Macy Mis M.D.   On: 07/30/2021 09:45   DG INJECT DIAG/THERA/INC NEEDLE/CATH/PLC EPI/LUMB/SAC W/IMG  Result Date: 07/27/2021 CLINICAL DATA:  Lumbosacral  spondylosis without myelopathy. Chronic right-sided low back pain radiating around the right hip to the front of the right thigh. Back pain is more bothersome. Spondylosis at L3-L4 and L4-L5. Prior left hemilaminectomy at L4-L5. History of metastatic lung cancer. FLUOROSCOPY: Radiation Exposure Index (as provided by the fluoroscopic device): 2.2 mGy Kerma PROCEDURE: The procedure, risks, benefits, and alternatives were explained to the patient. Questions regarding the procedure were encouraged and answered. The patient understands and consents to the procedure. LUMBAR EPIDURAL INJECTION: An interlaminar approach was performed on the right at L4-L5. The overlying skin was cleansed and anesthetized. A 3.5 inch 20 gauge epidural needle was advanced using loss-of-resistance technique. DIAGNOSTIC EPIDURAL INJECTION: Injection of Isovue-M 200 shows a good epidural pattern with spread above and below the level of needle placement, primarily on the right. No vascular opacification is seen. THERAPEUTIC EPIDURAL INJECTION: 80 mg of Depo-Medrol mixed with 3 mL of 1% lidocaine were instilled. The procedure was well-tolerated, and the patient was discharged thirty minutes following the injection in good condition. COMPLICATIONS: None immediate. IMPRESSION: Technically successful interlaminar epidural injection on the right at L4-L5. Electronically Signed   By: Titus Dubin M.D.   On: 07/27/2021 11:16     MR Lumbar Spine W Wo Contrast  Result Date: 08/25/2021 CLINICAL DATA:  Worsening back and right leg pain. History of metastatic lung cancer. EXAM: MRI THORACIC AND LUMBAR SPINE WITHOUT AND WITH CONTRAST TECHNIQUE: Multiplanar and multiecho pulse sequences of the thoracic and lumbar spine were obtained without and with intravenous contrast. CONTRAST:  79mL GADAVIST GADOBUTROL 1 MMOL/ML IV SOLN COMPARISON:  CT lumbar myelogram dated August 20, 2021. MRI thoracic and lumbar spine dated Jul 30, 2021. FINDINGS: MRI THORACIC SPINE  FINDINGS Alignment:  Physiologic. Vertebrae: T3, T6, T8, and T11 metastases have increased in size. New tiny lesions at T10 and T12 (series 17, images 5 and 7). No epidural tumor extension. No fracture or evidence of discitis. Cord: Normal signal and morphology. No abnormal intrathecal enhancement. Paraspinal and other soft tissues: Negative. Disc levels: Unchanged small right paracentral disc protrusions at T1-T2 and T2-T3. No spinal canal or neuroforaminal stenosis at any level. MRI LUMBAR SPINE FINDINGS Segmentation:  Standard. Alignment:  Unchanged mild retrolisthesis at L3-L4. Vertebrae: L4 and S2 vertebral body metastases have increased in size. New L1, L2, L3, S1, and S3 vertebral body metastases. No epidural tumor extension. No fracture or evidence of discitis. Conus medullaris: Extends to the L1-L2 level and appears normal. No abnormal intrathecal enhancement. Paraspinal and other soft tissues: Enlarging metastases in the bilateral posterior iliac bones. Enlarging upper abdominal lymphadenopathy. Disc levels: T12-L1:  Negative. L1-L2:  Negative. L2-L3: Negative disc. Unchanged mild bilateral facet arthropathy. No stenosis. L3-L4: Unchanged mild disc bulging with superimposed right subarticular disc protrusion. Unchanged mild bilateral facet arthropathy. Unchanged mild spinal canal stenosis. Unchanged moderate right and mild left lateral recess stenosis. Unchanged mild bilateral neuroforaminal stenosis. L4-L5: Prior left hemilaminectomy. Unchanged mild disc bulging with superimposed right extraforaminal disc protrusion contacting the exiting right L4  nerve root. Unchanged mild right lateral recess and bilateral neuroforaminal stenosis. No spinal canal stenosis. L5-S1: Unchanged small left paracentral disc protrusion and mild bilateral facet arthropathy. No stenosis. IMPRESSION: 1. New and progressive thoracic and lumbosacral osseous metastatic disease without significant extraosseous extension or pathologic  fracture. 2. Enlarging bilateral iliac bone and upper abdominal nodal metastatic disease. 3. Unchanged multilevel lumbar spondylosis as described above. Right extraforaminal disc protrusion contacts and potentially irritates the exiting right L4 nerve root. Electronically Signed   By: Titus Dubin M.D.   On: 08/25/2021 14:55   MR THORACIC SPINE W WO CONTRAST  Result Date: 08/25/2021 CLINICAL DATA:  Worsening back and right leg pain. History of metastatic lung cancer. EXAM: MRI THORACIC AND LUMBAR SPINE WITHOUT AND WITH CONTRAST TECHNIQUE: Multiplanar and multiecho pulse sequences of the thoracic and lumbar spine were obtained without and with intravenous contrast. CONTRAST:  11mL GADAVIST GADOBUTROL 1 MMOL/ML IV SOLN COMPARISON:  CT lumbar myelogram dated August 20, 2021. MRI thoracic and lumbar spine dated Jul 30, 2021. FINDINGS: MRI THORACIC SPINE FINDINGS Alignment:  Physiologic. Vertebrae: T3, T6, T8, and T11 metastases have increased in size. New tiny lesions at T10 and T12 (series 17, images 5 and 7). No epidural tumor extension. No fracture or evidence of discitis. Cord: Normal signal and morphology. No abnormal intrathecal enhancement. Paraspinal and other soft tissues: Negative. Disc levels: Unchanged small right paracentral disc protrusions at T1-T2 and T2-T3. No spinal canal or neuroforaminal stenosis at any level. MRI LUMBAR SPINE FINDINGS Segmentation:  Standard. Alignment:  Unchanged mild retrolisthesis at L3-L4. Vertebrae: L4 and S2 vertebral body metastases have increased in size. New L1, L2, L3, S1, and S3 vertebral body metastases. No epidural tumor extension. No fracture or evidence of discitis. Conus medullaris: Extends to the L1-L2 level and appears normal. No abnormal intrathecal enhancement. Paraspinal and other soft tissues: Enlarging metastases in the bilateral posterior iliac bones. Enlarging upper abdominal lymphadenopathy. Disc levels: T12-L1:  Negative. L1-L2:  Negative. L2-L3:  Negative disc. Unchanged mild bilateral facet arthropathy. No stenosis. L3-L4: Unchanged mild disc bulging with superimposed right subarticular disc protrusion. Unchanged mild bilateral facet arthropathy. Unchanged mild spinal canal stenosis. Unchanged moderate right and mild left lateral recess stenosis. Unchanged mild bilateral neuroforaminal stenosis. L4-L5: Prior left hemilaminectomy. Unchanged mild disc bulging with superimposed right extraforaminal disc protrusion contacting the exiting right L4 nerve root. Unchanged mild right lateral recess and bilateral neuroforaminal stenosis. No spinal canal stenosis. L5-S1: Unchanged small left paracentral disc protrusion and mild bilateral facet arthropathy. No stenosis. IMPRESSION: 1. New and progressive thoracic and lumbosacral osseous metastatic disease without significant extraosseous extension or pathologic fracture. 2. Enlarging bilateral iliac bone and upper abdominal nodal metastatic disease. 3. Unchanged multilevel lumbar spondylosis as described above. Right extraforaminal disc protrusion contacts and potentially irritates the exiting right L4 nerve root. Electronically Signed   By: Titus Dubin M.D.   On: 08/25/2021 14:55   CT ABDOMEN PELVIS W CONTRAST  Result Date: 08/25/2021 CLINICAL DATA:  61 year old with acute abdominal pain. History of metastatic lung cancer. EXAM: CT ABDOMEN AND PELVIS WITH CONTRAST TECHNIQUE: Multidetector CT imaging of the abdomen and pelvis was performed using the standard protocol following bolus administration of intravenous contrast. RADIATION DOSE REDUCTION: This exam was performed according to the departmental dose-optimization program which includes automated exposure control, adjustment of the mA and/or kV according to patient size and/or use of iterative reconstruction technique. CONTRAST:  140mL OMNIPAQUE IOHEXOL 300 MG/ML  SOLN COMPARISON:  CTA chest, abdomen and pelvis  05/27/2021 and CT of the chest, abdomen and  pelvis on 06/18/2021 and PET/CT 06/22/2021. Thoracic and lumbar MRI 07/30/2021. Abdominal ultrasound 08/24/2021. FINDINGS: Lower chest: Again noted are small pulmonary nodules in the right lower lobe. There is an dominant nodule in the right lower lobe that measures approximately 6 mm minimally changed since 05/27/2021. However, there is a new punctate nodule in the medial right lower lobe on sequence 6, image 25. In addition, there appears to be a new 3 mm nodule in the right lower lobe on sequence 6 image 7. New punctate nodule in the right middle lobe on image 22. Findings are suggestive for small lung metastases. Hepatobiliary: There are innumerable low-density lesions scattered throughout the liver of which are new and suggestive for hepatic metastatic disease. In addition, there is diffuse narrowing of the main right portal vein and evidence for thrombus involving right intrahepatic portal venous branches. The main and left intrahepatic portal veins appear to be patent. Poorly defined low-density structures throughout the right hepatic lobe are most with metastatic disease, most prominent in segment 8. Gallbladder is mildly distended. Mild pericholecystic fluid associated with the gallbladder. Difficult to exclude intrahepatic biliary dilatation in the right hepatic lobe. Pancreas: Unremarkable. No pancreatic ductal dilatation or surrounding inflammatory changes. Spleen: There are least 2 small low-density structures in the spleen that are stable. Spleen is normal in size. Adrenals/Urinary Tract: Normal adrenal glands. Normal appearance of both kidneys without hydronephrosis. No suspicious renal lesions. No definite kidney stones. Normal appearance of the urinary bladder. Stomach/Bowel: Stomach is within normal limits. Appendix appears normal. No evidence of bowel wall thickening, distention, or inflammatory changes. Vascular/Lymphatic: Atherosclerotic disease in the abdominal aorta without aneurysm. Main  visceral arteries are patent. Enlarged low-density lymph node in the upper abdominal pre caval region on sequence 2 image 29. This precaval lymph node measures 1.5 cm in short axis and previously measured 1.3 cm on 06/22/2021. Enlarged retroperitoneal lymph nodes around the IVC. There is another index lymph node between the IVC and the aorta on image 34 that measures 1.0 cm in short axis. Thrombosis of right portal vein branches. Narrowing of the main right portal vein appears new. Main portal vein and portal vein confluence appear to be patent although the main portal vein is adjacent to the enlarged precaval lymph node. Reproductive: Prostate is unremarkable. Other: No significant ascites other than the minimal fluid around the gallbladder. Umbilical hernia containing fat. Bilateral inguinal hernias containing fat. Musculoskeletal: Expansile lytic lesion involving the anterior aspect of the right iliac wing that measures 3.4 x 3.1 cm and markedly enlarged since 06/18/2021. Again noted is lucency along the anterior L4 vertebral body and suggestive for a lucent bone lesion. There is also a lesion along the posteroinferior aspect of the T11 vertebral body suggestive for bone metastasis. Again noted is a lucent lesion involving the left ilium on sequence 2 image 54. IMPRESSION: 1. Imaging findings are compatible with progression of metastatic disease. New hepatic metastasis with new small pulmonary nodules, enlarged upper abdominal lymph nodes and progression of bone disease. 2. Significant change in the appearance of the liver with innumerable hepatic lesions. 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 The main right portal vein is also narrowed compared to the previous examination. 3. Small amount of pericholecystic fluid is nonspecific. Gallbladder is mildly distended. Findings are similar to the recent ultrasound findings. These findings  could be associated with the hepatic  disease. Ultrasound and CT findings are equivocal for cholecystitis and consider further evaluation of the gallbladder with a HIDA scan (if clinically indicated). 4. Scattered bony lesions compatible with metastatic disease. Enlargement of a lytic expansile lesion involving the right iliac wing. 5. Umbilical hernia.  Bilateral inguinal hernias. 6.  Aortic Atherosclerosis (ICD10-I70.0). These results were called by telephone at the time of interpretation on 08/25/2021 at 11:24 am to provider [facility] , who verbally acknowledged these results. Electronically Signed   By: Markus Daft M.D.   On: 08/25/2021 11:24   CT L-SPINE NO CHARGE  Result Date: 08/25/2021 CLINICAL DATA:  Worsening back and right leg pain. History of metastatic lung cancer. EXAM: CT LUMBAR SPINE WITHOUT CONTRAST TECHNIQUE: Multidetector CT imaging of the lumbar spine was performed without intravenous contrast administration. Multiplanar CT image reconstructions were also generated. RADIATION DOSE REDUCTION: This exam was performed according to the departmental dose-optimization program which includes automated exposure control, adjustment of the mA and/or kV according to patient size and/or use of iterative reconstruction technique. COMPARISON:  CT lumbar myelogram dated August 20, 2021. MRI thoracic and lumbar spine dated Jul 30, 2021. FINDINGS: Segmentation: 5 lumbar type vertebrae. Alignment: Unchanged levoscoliosis. Unchanged mild retrolisthesis at L3-L4. Vertebrae: Small lytic lesions in the T11 and L4 vertebral bodies. No acute fracture. Paraspinal and other soft tissues: Please see separate CT abdomen pelvis report from same day. Incompletely visualized diffuse hepatic metastatic disease. Portacaval and aortocaval lymphadenopathy. Unchanged lytic lesion in the left iliac bone. Aortoiliac atherosclerotic vascular disease. Disc levels: Multilevel degenerative changes are unchanged since lumbar myelogram 5  days ago, including a right extraforaminal disc protrusion at L4-L5 closely approximating and potentially irritating the right L4 nerve root. IMPRESSION: 1. No acute osseous abnormality. 2. Unchanged bony metastases in the T11 and L4 vertebral bodies and left iliac bone. 3. Incompletely visualized intra-abdominal metastatic disease involving the liver and upper abdominal lymph nodes. 4. Aortic Atherosclerosis (ICD10-I70.0). Electronically Signed   By: Titus Dubin M.D.   On: 08/25/2021 10:56   US Abdomen Limited  Result Date: 08/24/2021 CLINICAL DATA:  Elevated LFTs with right-sided abdominal pain. History of metastatic lung cancer. EXAM: ULTRASOUND ABDOMEN LIMITED RIGHT UPPER QUADRANT COMPARISON:  PET-CT June 22, 2021 FINDINGS: Gallbladder: Biliary sludge with gallbladder calculi measuring up to 3 mm. Wall thickening measuring up to 8 mm with some pericholecystic fluid. No sonographic Murphy sign noted by sonographer. Common bile duct: Diameter: 9 mm Liver: Heterogeneous appearance of the hepatic parenchyma with multiple small hepatic lesions identified measuring up to 1.6 cm. Portal vein is patent on color Doppler imaging with normal direction of blood flow towards the liver. Other: None. IMPRESSION: 1. Heterogeneous appearance of the hepatic parenchyma with multiple small hepatic lesions identified, suspicious for metastatic disease. 2. Cholelithiasis and sludge with gallbladder wall thickening measuring up to 8 mm but with a negative sonographic Murphy sign. Findings which are favored to reflect sequela of hepatic disease. If continued clinical concern for cholecystitis recommend further evaluation with nuclear medicine HIDA scan. 3. Prominence of the common bile duct measuring 9 mm, recommend correlation with laboratory values to assess for biliary obstruction. Electronically Signed   By: Dahlia Bailiff M.D.   On: 08/24/2021 15:31   DG MYELOGRAPHY LUMBAR INJ LUMBOSACRAL  Result Date:  08/20/2021 CLINICAL DATA:  Disc displacement, lumbar. Right lower extremity radiculopathy extending to lateral anterior thigh without relief from L4-5 epidural steroid injection. EXAM: LUMBAR MYELOGRAM FLUOROSCOPY: Radiation Exposure Index (as provided by the fluoroscopic device): Dose  area product 716.04 uGy*m2 PROCEDURE: After thorough discussion of risks and benefits of the procedure including bleeding, infection, injury to nerves, blood vessels, adjacent structures as well as headache and CSF leak, written and oral informed consent was obtained. Consent was obtained by Dr. San Morelle. Time out form was completed. Patient was positioned prone on the fluoroscopy table. Local anesthesia was provided with 1% lidocaine without epinephrine after prepped and draped in the usual sterile fashion. Puncture was performed at L2-3 using a 3 1/2 inch 22-gauge spinal needle via left paramidline approach. Using a single pass through the dura, the needle was placed within the thecal sac, with return of clear CSF. 15 mL of Isovue M-200 was injected into the thecal sac, with normal opacification of the nerve roots and cauda equina consistent with free flow within the subarachnoid space. I personally performed the lumbar puncture and administered the intrathecal contrast. I also personally supervised acquisition of the myelogram images. TECHNIQUE: Contiguous axial images were obtained through the Lumbar spine after the intrathecal infusion of infusion. Coronal and sagittal reconstructions were obtained of the axial image sets. COMPARISON:  MRI of the lumbar spine without contrast 07/30/2021 FINDINGS: LUMBAR MYELOGRAM FINDINGS: Five non rib-bearing lumbar type vertebral bodies are present. Subtle changes are noted at the known L4 lesion. Vertebral body heights are maintained. Slight retrolisthesis is present at L3-4. This is slightly worse with extension. No other significant listhesis is present. Atherosclerotic  calcifications are present in the aorta without aneurysm. Broad-based disc protrusion is present at L3-4. This results in moderate central canal stenosis subarticular narrowing bilaterally with right subarticular narrowing. Right subarticular narrowing is also present at L4-5 secondary to a broad-based disc protrusion. The nerve roots otherwise fill normally on both sides. CT LUMBAR MYELOGRAM FINDINGS: Lumbar spine is imaged from T12 through S2. Vertebral body heights are maintained. Lucent lesion anteriorly at L4 measures 9 mm cephalo caudad, compatible with the suspected metastasis. Multiple lucent lesions are again noted within the left iliac bone. There is destruction of the medial cortex. Atherosclerotic calcifications are present within the aorta and branch vessels. Visualized abdomen is otherwise unremarkable. No significant retroperitoneal adenopathy is present. Mild leftward curvature of the lumbar spine is centered at L3-4. T12-L1: Negative. L1-2: Mild facet hypertrophy is present bilaterally. No significant stenosis is present. L2-3: Mild facet hypertrophy is present. No significant disc protrusion or stenosis is present. L3-4: A broad-based disc protrusion is asymmetric to the right. A left-sided synovial cyst creates some mass effect on the posterior canal. Right greater than left subarticular narrowing is present. Moderate foraminal narrowing is worse right than left. L4-5: A broad-based disc protrusion is again seen. Left laminectomy noted. The canal is decompressed posteriorly on the left. Facet spurring and disc protrusions result in moderate foraminal stenosis, right greater than left. Mild subarticular narrowing is worse right than left. L5-S1: A shallow central disc protrusion is present without significant stenosis. IMPRESSION: 1. Left laminectomy at L4-5 with decompression of the canal posteriorly on the left. 2. Moderate foraminal stenosis bilaterally at L4-5 is worse on the right. 3. Mild  subarticular narrowing at L4-5 is worse right than left. 4. Moderate central canal stenosis at L3-4 with right greater than left subarticular narrowing. 5. Mild retrolisthesis at L3-4 is slightly exacerbated with extension. 6. Moderate foraminal narrowing bilaterally at L3-4 is worse on the right. 7. Suspected metastases at L4 and in the left iliac bone. 8. Mild facet hypertrophy at L1-2 and L2-3 without significant stenosis. 9. Aortic Atherosclerosis (ICD10-I70.0).  Electronically Signed   By: San Morelle M.D.   On: 08/20/2021 12:13   CT LUMBAR SPINE W CONTRAST  Result Date: 08/20/2021 CLINICAL DATA:  Disc displacement, lumbar. Right lower extremity radiculopathy extending to lateral anterior thigh without relief from L4-5 epidural steroid injection. EXAM: LUMBAR MYELOGRAM FLUOROSCOPY: Radiation Exposure Index (as provided by the fluoroscopic device): Dose area product 716.04 uGy*m2 PROCEDURE: After thorough discussion of risks and benefits of the procedure including bleeding, infection, injury to nerves, blood vessels, adjacent structures as well as headache and CSF leak, written and oral informed consent was obtained. Consent was obtained by Dr. San Morelle. Time out form was completed. Patient was positioned prone on the fluoroscopy table. Local anesthesia was provided with 1% lidocaine without epinephrine after prepped and draped in the usual sterile fashion. Puncture was performed at L2-3 using a 3 1/2 inch 22-gauge spinal needle via left paramidline approach. Using a single pass through the dura, the needle was placed within the thecal sac, with return of clear CSF. 15 mL of Isovue M-200 was injected into the thecal sac, with normal opacification of the nerve roots and cauda equina consistent with free flow within the subarachnoid space. I personally performed the lumbar puncture and administered the intrathecal contrast. I also personally supervised acquisition of the myelogram images.  TECHNIQUE: Contiguous axial images were obtained through the Lumbar spine after the intrathecal infusion of infusion. Coronal and sagittal reconstructions were obtained of the axial image sets. COMPARISON:  MRI of the lumbar spine without contrast 07/30/2021 FINDINGS: LUMBAR MYELOGRAM FINDINGS: Five non rib-bearing lumbar type vertebral bodies are present. Subtle changes are noted at the known L4 lesion. Vertebral body heights are maintained. Slight retrolisthesis is present at L3-4. This is slightly worse with extension. No other significant listhesis is present. Atherosclerotic calcifications are present in the aorta without aneurysm. Broad-based disc protrusion is present at L3-4. This results in moderate central canal stenosis subarticular narrowing bilaterally with right subarticular narrowing. Right subarticular narrowing is also present at L4-5 secondary to a broad-based disc protrusion. The nerve roots otherwise fill normally on both sides. CT LUMBAR MYELOGRAM FINDINGS: Lumbar spine is imaged from T12 through S2. Vertebral body heights are maintained. Lucent lesion anteriorly at L4 measures 9 mm cephalo caudad, compatible with the suspected metastasis. Multiple lucent lesions are again noted within the left iliac bone. There is destruction of the medial cortex. Atherosclerotic calcifications are present within the aorta and branch vessels. Visualized abdomen is otherwise unremarkable. No significant retroperitoneal adenopathy is present. Mild leftward curvature of the lumbar spine is centered at L3-4. T12-L1: Negative. L1-2: Mild facet hypertrophy is present bilaterally. No significant stenosis is present. L2-3: Mild facet hypertrophy is present. No significant disc protrusion or stenosis is present. L3-4: A broad-based disc protrusion is asymmetric to the right. A left-sided synovial cyst creates some mass effect on the posterior canal. Right greater than left subarticular narrowing is present. Moderate  foraminal narrowing is worse right than left. L4-5: A broad-based disc protrusion is again seen. Left laminectomy noted. The canal is decompressed posteriorly on the left. Facet spurring and disc protrusions result in moderate foraminal stenosis, right greater than left. Mild subarticular narrowing is worse right than left. L5-S1: A shallow central disc protrusion is present without significant stenosis. IMPRESSION: 1. Left laminectomy at L4-5 with decompression of the canal posteriorly on the left. 2. Moderate foraminal stenosis bilaterally at L4-5 is worse on the right. 3. Mild subarticular narrowing at L4-5 is worse right than left. 4.  Moderate central canal stenosis at L3-4 with right greater than left subarticular narrowing. 5. Mild retrolisthesis at L3-4 is slightly exacerbated with extension. 6. Moderate foraminal narrowing bilaterally at L3-4 is worse on the right. 7. Suspected metastases at L4 and in the left iliac bone. 8. Mild facet hypertrophy at L1-2 and L2-3 without significant stenosis. 9. Aortic Atherosclerosis (ICD10-I70.0). Electronically Signed   By: San Morelle M.D.   On: 08/20/2021 12:13   MR Lumbar Spine W Wo Contrast  Result Date: 07/30/2021 CLINICAL DATA:  Low back pain, cancer suspected EXAM: MRI LUMBAR SPINE WITHOUT AND WITH CONTRAST TECHNIQUE: Multiplanar and multiecho pulse sequences of the lumbar spine were obtained without and with intravenous contrast. CONTRAST:  30mL GADAVIST GADOBUTROL 1 MMOL/ML IV SOLN COMPARISON:  None Available. FINDINGS: Segmentation: Standard segmentation is assumed. The inferior-most fully formed intervertebral disc labeled L5-S1. Alignment:  No substantial sagittal subluxation. Vertebrae: Approximately 1.8 cm T1 hypointense, stir hyperintense, enhancing lesion involving the anterior/superior L4 vertebral body, compatible with metastasis. Additional smaller (6 mm) metastasis involving the anterior/inferior S2 vertebral body. Multiple metastasis  involving the left iliac bone, measuring up to 2.2 cm. No evidence of epidural extension of enhancing tumor. No specific evidence of pathologic fracture. Conus medullaris and cauda equina: Conus extends to the L2 level. Conus appears normal. No abnormal enhancement of the conus or cauda equina. Paraspinal and other soft tissues: No acute finding on limited visualization/assessment. Disc levels: T12-L1: No significant disc protrusion, foraminal stenosis, or canal stenosis. L1-L2: No significant disc protrusion, foraminal stenosis, or canal stenosis. L2-L3: Bilateral facet arthropathy without significant stenosis. L3-L4: Broad disc bulging with superimposed right subarticular/foraminal and left foraminal disc protrusions. Resulting mild bilateral foraminal stenosis, mild canal stenosis, and right greater than left subarticular recess narrowing. L4-L5: Bilateral facet arthropathy. Expected postsurgical changes on the left. Right eccentric disc bulging. Resulting mild bilateral foraminal stenosis. Right far lateral/extraforaminal disc closely approximates the exiting right L5 nerve. Patent canal. L5-S1: Some left paracentral disc protrusion without significant stenosis. IMPRESSION: 1. Multiple osseous metastases involving the L4 and S2 vertebral bodies as well as the left sacrum, described above. No significant extraosseous extension or pathologic fracture. 2. Multilevel degenerative change, described above. Electronically Signed   By: Margaretha Sheffield M.D.   On: 07/30/2021 10:02   MR THORACIC SPINE W WO CONTRAST  Result Date: 07/30/2021 CLINICAL DATA:  Mid back pain, history of metastatic lung cancer EXAM: MRI THORACIC WITHOUT AND WITH CONTRAST TECHNIQUE: Multiplanar and multiecho pulse sequences of the thoracic spine were obtained without and with intravenous contrast. CONTRAST:  63mL GADAVIST GADOBUTROL 1 MMOL/ML IV SOLN COMPARISON:  Outside study 07/02/2021 FINDINGS: Alignment:  Stable. Vertebrae: Stable  vertebral body heights with mid to lower thoracic spine degenerative endplate irregularity. Abnormal marrow signal and enhancement at the right posterior aspect of T11 has increased in size. Increased abnormal signal and enhancement at the inferior endplates of T6 and T8. Increased focus of abnormal signal and enhancement at the superior endplate of T3. Stable lesion at T9 probably reflects a hemangioma. Cord:  No abnormal signal.  No abnormal intrathecal enhancement. Paraspinal and other soft tissues: Unremarkable. Disc levels: Stable minor degenerative changes without significant canal or foraminal stenosis. IMPRESSION: Increased foci of abnormal signal and enhancement including involvement of T3, T6, T8, and T11 consistent with metastatic disease. There is no significant extraosseous extension. No pathologic fracture. Electronically Signed   By: Macy Mis M.D.   On: 07/30/2021 09:45   DG INJECT DIAG/THERA/INC NEEDLE/CATH/PLC EPI/LUMB/SAC W/IMG  Result Date: 07/27/2021 CLINICAL DATA:  Lumbosacral spondylosis without myelopathy. Chronic right-sided low back pain radiating around the right hip to the front of the right thigh. Back pain is more bothersome. Spondylosis at L3-L4 and L4-L5. Prior left hemilaminectomy at L4-L5. History of metastatic lung cancer. FLUOROSCOPY: Radiation Exposure Index (as provided by the fluoroscopic device): 2.2 mGy Kerma PROCEDURE: The procedure, risks, benefits, and alternatives were explained to the patient. Questions regarding the procedure were encouraged and answered. The patient understands and consents to the procedure. LUMBAR EPIDURAL INJECTION: An interlaminar approach was performed on the right at L4-L5. The overlying skin was cleansed and anesthetized. A 3.5 inch 20 gauge epidural needle was advanced using loss-of-resistance technique. DIAGNOSTIC EPIDURAL INJECTION: Injection of Isovue-M 200 shows a good epidural pattern with spread above and below the level of needle  placement, primarily on the right. No vascular opacification is seen. THERAPEUTIC EPIDURAL INJECTION: 80 mg of Depo-Medrol mixed with 3 mL of 1% lidocaine were instilled. The procedure was well-tolerated, and the patient was discharged thirty minutes following the injection in good condition. COMPLICATIONS: None immediate. IMPRESSION: Technically successful interlaminar epidural injection on the right at L4-L5. Electronically Signed   By: Titus Dubin M.D.   On: 07/27/2021 11:16    Assessment and Plan:  1.  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  Intractable pain secondary to #1 3.  Normocytic anemia 4.  Thrombocytosis, likely reactive 5.  Elevated alkaline phosphatase 6.  Right portal vein thrombosis 7.  Diabetes mellitus 8.  COPD 9.  Tobacco dependence 10.  Anxiety/depression 11.  Protein calorie malnutrition 12.  Goals of care  -Imaging results performed on admission show progressive disease.  We will obtain an MRI of the liver with and without contrast to better characterize his liver lesions. -The patient has been seen by palliative care and appreciate their assistance with pain medication management.  IR referral has been placed for consideration of RFA to L4 or L5 nerve root.  Continue current pain medication and adjustments per palliative. -Hemoglobin drifting down since admission, likely dilutional.  Monitor.  Monitor platelets. -Monitor liver function closely. -Continue heparin drip for portal vein thrombosis.  Can consider switching him to [place] prior to hospital discharge. -Management of chronic medical conditions per hospitalist. -Recommend dietitian consultation. -The patient has metastatic lung cancer.  He has an incurable malignancy.  Goals of care have been discussed with the patient and he continues to desire to be a full code.  Recommend consideration of DNR.  Mikey Bussing, DNP, AGPCNP-BC, AOCNP   ADDENDUM: I saw Mr. Riemenschneider this  morning.  I just hate the fact that he is having some was difficulty with his back.  The problem now is that he has his right upper quadrant pain.  He has had some elevated alkaline phosphatase.  We did do an ultrasound which showed questionable lesions in the liver.  Unfortunately, his quality of life is clearly dictated by his back.  I wish there is some way we can try to help with his back.  He has radicular pain.  We will see if Interventional Radiology will be able to do some type of nerve block or RFA on the nerve root on the right side.  This is the L4 or L5 nerve root.  He, had repeat MRIs yesterday.  He has a lesion in L4.  I will also see if Interventional Radiology might be able to do vertebroplasty on this.  I really believe  that surgery is can be the best way to try to help his pain.  Unfortunately, I am not sure if surgery, either Neurosurgery or Orthopedic Surgery would operate on him given his issues with the metastatic lung cancer.  Again it was certainly looks like it is metastasized to his liver.  Again, we will have to try to work hard to help with this pain.  His life is dictated by the pain.  He just has very little quality of life, if any.  I know that he will get wonderful care follow-up on 5 E.  We will try to help get him better.  We may consider a ketamine infusion for him.  Sometimes this does have some good results with pain.  I really want to try him on Lyrica.  I am unsure exactly what reaction he had to Lyrica in the past.  I think was in the hospital, he would be worthwhile trying him on this.  We will certainly follow along closely.  Lattie Haw, MD  Romans 8:18

## 2021-08-27 ENCOUNTER — Encounter: Payer: Self-pay | Admitting: Nurse Practitioner

## 2021-08-27 ENCOUNTER — Telehealth: Payer: Self-pay

## 2021-08-27 DIAGNOSIS — R52 Pain, unspecified: Secondary | ICD-10-CM | POA: Diagnosis not present

## 2021-08-27 DIAGNOSIS — C7931 Secondary malignant neoplasm of brain: Secondary | ICD-10-CM | POA: Diagnosis not present

## 2021-08-27 DIAGNOSIS — C7951 Secondary malignant neoplasm of bone: Secondary | ICD-10-CM | POA: Diagnosis not present

## 2021-08-27 DIAGNOSIS — C3492 Malignant neoplasm of unspecified part of left bronchus or lung: Secondary | ICD-10-CM | POA: Diagnosis not present

## 2021-08-27 DIAGNOSIS — C787 Secondary malignant neoplasm of liver and intrahepatic bile duct: Secondary | ICD-10-CM | POA: Diagnosis not present

## 2021-08-27 LAB — BASIC METABOLIC PANEL
Anion gap: 7 (ref 5–15)
BUN: 9 mg/dL (ref 6–20)
CO2: 21 mmol/L — ABNORMAL LOW (ref 22–32)
Calcium: 8.1 mg/dL — ABNORMAL LOW (ref 8.9–10.3)
Chloride: 106 mmol/L (ref 98–111)
Creatinine, Ser: 0.52 mg/dL — ABNORMAL LOW (ref 0.61–1.24)
GFR, Estimated: >60 mL/min (ref >60)
Glucose, Bld: 236 mg/dL — ABNORMAL HIGH (ref 70–99)
Potassium: 4.4 mmol/L (ref 3.5–5.1)
Sodium: 134 mmol/L — ABNORMAL LOW (ref 135–145)

## 2021-08-27 LAB — CBC
HCT: 34.2 % — ABNORMAL LOW (ref 39.0–52.0)
Hemoglobin: 11.1 g/dL — ABNORMAL LOW (ref 13.0–17.0)
MCH: 30.6 pg (ref 26.0–34.0)
MCHC: 32.5 g/dL (ref 30.0–36.0)
MCV: 94.2 fL (ref 80.0–100.0)
Platelets: 556 10*3/uL — ABNORMAL HIGH (ref 150–400)
RBC: 3.63 MIL/uL — ABNORMAL LOW (ref 4.22–5.81)
RDW: 15.2 % (ref 11.5–15.5)
WBC: 10 10*3/uL (ref 4.0–10.5)
nRBC: 0 % (ref 0.0–0.2)

## 2021-08-27 LAB — GLUCOSE, CAPILLARY
Glucose-Capillary: 195 mg/dL — ABNORMAL HIGH (ref 70–99)
Glucose-Capillary: 183 mg/dL — ABNORMAL HIGH (ref 70–99)
Glucose-Capillary: 120 mg/dL — ABNORMAL HIGH (ref 70–99)
Glucose-Capillary: 141 mg/dL — ABNORMAL HIGH (ref 70–99)

## 2021-08-27 LAB — PREALBUMIN: Prealbumin: 11.2 mg/dL — ABNORMAL LOW (ref 18–38)

## 2021-08-27 LAB — HEPARIN LEVEL (UNFRACTIONATED): Heparin Unfractionated: 0.54 IU/mL (ref 0.30–0.70)

## 2021-08-27 MED ORDER — DIPHENHYDRAMINE HCL 12.5 MG/5ML PO ELIX: 12.5000 mg | ORAL_SOLUTION | Freq: Four times a day (QID) | ORAL | Status: DC | PRN

## 2021-08-27 MED ORDER — ENSURE ENLIVE PO LIQD
237.0000 mL | Freq: Two times a day (BID) | ORAL | Status: DC
  Administered 2021-08-27 – 2021-08-29 (×4): 237 mL via ORAL

## 2021-08-27 MED ORDER — NALOXONE HCL 0.4 MG/ML IJ SOLN: 0.4000 mg | INTRAMUSCULAR | Status: DC | PRN

## 2021-08-27 MED ORDER — OXYCODONE HCL 5 MG PO TABS
10.0000 mg | ORAL_TABLET | ORAL | Status: DC | PRN
  Administered 2021-08-27 – 2021-09-03 (×13): 10 mg via ORAL
  Filled 2021-08-27 (×15): qty 2

## 2021-08-27 MED ORDER — SODIUM CHLORIDE 0.9% FLUSH: 9.0000 mL | INTRAVENOUS | Status: DC | PRN

## 2021-08-27 MED ORDER — HYDROMORPHONE 1 MG/ML IV SOLN: INTRAVENOUS | Status: DC

## 2021-08-27 MED ORDER — HYDROMORPHONE HCL 1 MG/ML IJ SOLN
2.0000 mg | INTRAMUSCULAR | Status: DC | PRN
  Administered 2021-08-27 (×2): 2 mg via INTRAVENOUS
  Filled 2021-08-27: qty 2

## 2021-08-27 MED ORDER — HYDROMORPHONE 1 MG/ML IV SOLN
INTRAVENOUS | Status: DC
  Administered 2021-08-27: 1 mg via INTRAVENOUS
  Filled 2021-08-27: qty 30

## 2021-08-27 MED ORDER — TRAZODONE HCL 50 MG PO TABS
50.0000 mg | ORAL_TABLET | Freq: Every day | ORAL | Status: DC
  Administered 2021-08-27 – 2021-09-01 (×2): 50 mg via ORAL
  Filled 2021-08-27 (×5): qty 1

## 2021-08-27 MED ORDER — HYDROMORPHONE HCL 1 MG/ML IJ SOLN
2.0000 mg | Freq: Once | INTRAMUSCULAR | Status: AC
  Administered 2021-08-27: 2 mg via INTRAVENOUS
  Filled 2021-08-27: qty 2

## 2021-08-27 MED ORDER — DRONABINOL 2.5 MG PO CAPS
2.5000 mg | ORAL_CAPSULE | Freq: Three times a day (TID) | ORAL | Status: DC
  Administered 2021-08-27 – 2021-09-02 (×17): 2.5 mg via ORAL
  Filled 2021-08-27 (×19): qty 1

## 2021-08-27 MED ORDER — DIPHENHYDRAMINE HCL 50 MG/ML IJ SOLN
12.5000 mg | Freq: Four times a day (QID) | INTRAMUSCULAR | Status: DC | PRN
Start: 2021-08-27 — End: 2021-08-31

## 2021-08-27 MED ORDER — ADULT MULTIVITAMIN W/MINERALS CH
1.0000 | ORAL_TABLET | Freq: Every day | ORAL | Status: DC
Start: 2021-08-27 — End: 2021-09-03
  Administered 2021-08-27 – 2021-09-03 (×4): 1 via ORAL
  Filled 2021-08-27 (×6): qty 1

## 2021-08-27 MED ORDER — NALOXONE HCL 0.4 MG/ML IJ SOLN
0.4000 mg | INTRAMUSCULAR | Status: DC | PRN
  Administered 2021-08-27: 0.4 mg via INTRAVENOUS
  Filled 2021-08-27: qty 1

## 2021-08-27 MED ORDER — HYDROMORPHONE HCL 1 MG/ML IJ SOLN
2.0000 mg | Freq: Once | INTRAMUSCULAR | Status: DC
  Filled 2021-08-27: qty 2

## 2021-08-27 MED ORDER — DIPHENHYDRAMINE HCL 50 MG/ML IJ SOLN: 12.5000 mg | Freq: Four times a day (QID) | INTRAMUSCULAR | Status: DC | PRN

## 2021-08-27 MED ORDER — HYDROMORPHONE HCL 1 MG/ML IJ SOLN
0.5000 mg | Freq: Once | INTRAMUSCULAR | Status: AC | PRN
  Administered 2021-08-27: 0.5 mg via INTRAVENOUS
  Filled 2021-08-27: qty 0.5

## 2021-08-27 MED ORDER — INSULIN ASPART 100 UNIT/ML IJ SOLN
0.0000 [IU] | Freq: Three times a day (TID) | INTRAMUSCULAR | Status: DC
  Administered 2021-08-27: 3 [IU] via SUBCUTANEOUS
  Administered 2021-08-28: 5 [IU] via SUBCUTANEOUS
  Administered 2021-08-29: 3 [IU] via SUBCUTANEOUS
  Administered 2021-08-30 – 2021-09-01 (×6): 2 [IU] via SUBCUTANEOUS
  Administered 2021-09-02: 3 [IU] via SUBCUTANEOUS
  Administered 2021-09-02 – 2021-09-03 (×2): 2 [IU] via SUBCUTANEOUS
  Administered 2021-09-03: 3 [IU] via SUBCUTANEOUS

## 2021-08-27 NOTE — Progress Notes (Signed)
PROGRESS NOTE Bill Garza  VHQ:[id number] DOB: October 07, 1960 DOA: 08/25/2021 PCP: Aleen Campi, NP   Brief Narrative/Hospital Course: 61 y.o. 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 Presenting with intractable back pain and abdominal pain-  imaging w/ new portal vein thrombosis-started on anticoagulation after discussing with [name], palliative care hematology consulted On admission: MR lumbar thoracic spine shows-new and progressive thoracic and lumbosacral osseous metastatic disease without significant extraosseous extension or pathologic fracture, enlarging bilateral iliac bone and upper abdominal nodal metastatic disease, unchanged multilevel lumbar spondylosis.  Subjective: Seen and examined.  On the edge of the bed patient reports pain is much better this morning with pain management  Had pain last night and had difficulty sleeping  Overnight afebrile, glucose 236 this a.m. 195 hemoglobin stable  Assessment and Plan: Principal Problem:   Intractable pain Active Problems:   Non-small cell lung cancer metastatic to brain 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 this morning Seen by Dr. Hilma Favors after extensive discussion patient changing to PCA pump for better control of the pain.  on Decadron, methadone, Toradol plus bowel regimen.  [name] has consulted IR to consider  RFA to L4 or L5 nerve root.  Palliative care also consult IR to look for iliac bone freezing.  Liver MRI shows innumerable more than 30 liver masses scattered throughout new since 3/31, several enhancing osseous lesions throughout the visualized skeleton increased since 3/31, mild to moderate intrahepatic biliary duct dilatation, no choledocholithiasis, small gallbladder sludge  Portal vein thrombosis: Continue heparin drip and switch to DOAC closer to discharge.  On admission neurosurgery team Dr. Salomon Fick was consulted about anticoagulation and there is no contraindication to [facility] at this point w/ respect to his brain.  DM2: A1c 7.1 blood sugar poorly controlled, likely in the setting of patient's Decadron increase sliding scale increase to 0-15 unit Continue to hold metformin Recent Labs  Lab 08/25/21 1730 08/25/21 2016 08/26/21 1129 08/26/21 1729 08/26/21 2053 08/27/21 0708 08/27/21 1124  GLUCAP  --    < > 132* 103* 144* 195* 183*  HGBA1C 7.1*  --   --   --   --   --   --    < > = values in this interval not displayed.    Anxiety/depression: Appears anxious, resume home meds-Lexapro and Ativan.  COPD Tobacco abuse: Not in exacerbation continue home inhalers-Dulera, Singulair  Elevated alkaline phosphatase level: Likely from metastatic disease.  Monitor  SIRS-on admission mild tachycardia, leukocytosis 13.3.  No obvious infection source noted patient had CT chest abdomen pelvis leukocytosis has resolved likely reactive.  UA unremarkable with negative nitrate and leukocytes.  Holding off on antibiotics.  We will not be surprised if WBC counts trend up since he is now on Decadron IV.  Follow-up blood culture.??  ?? possible cholecystitis on admission,HIDA was ordered but refused-doubt it will be beneficial. White count remains normal. Recent Labs  Lab 08/23/21 1320  08/25/21 0812 08/26/21 0532 08/27/21 0231  WBC 13.3* 12.8* 9.9 10.0    Normocytic anemia hb stable.Monitor Recent Labs   Lab 08/23/21 1320 08/25/21 0812 08/26/21 0532 08/27/21 0231  HGB 13.2 12.0* 10.3* 11.1*  HCT 39.0 36.3* 32.1* 34.2*     Hyponatremia: Resolved Recent Labs  Lab 08/23/21 1403 08/25/21 0812 08/26/21 0532 08/27/21 0231  NA 130* 132* 136 134*     Goals of care: Oncology has discussed extensively patient has an incurable malignancy continues to desire full code DNR has been recommended, palliative care on board. Cont pain control as #1  DVT prophylaxis: SCDs Start: 08/25/21 1527 Code Status:   Code Status: Full Code Family Communication: plan of care discussed with patient/family at bedside. Patient status is: Inpatient because of ongoing need for IV pain management with intractable pain Level of care: Telemetry   Dispo: The patient is from: Home            Anticipated disposition: TBD  Mobility Assessment (last 72 hours)     Mobility Assessment     Row Name 08/25/21 2031 08/25/21 1800         Does patient have an order for bedrest or is patient medically unstable No - Continue assessment No - Continue assessment      What is the highest level of mobility based on the progressive mobility assessment? Level 6 (Walks independently in room and hall) - Balance while walking in room without assist - Complete --  UTA pt in too much pain to get out of bed at this time                Objective: Vitals last 24 hrs: Vitals:   08/26/21 1726 08/26/21 1727 08/26/21 2050 08/27/21 0554  BP: 123/75  123/78 (!) 137/103  Pulse: 82  74 76  Resp: 13  16 18   Temp:  97.7 F (36.5 C) 97.7 F (36.5 C) 97.7 F (36.5 C)  TempSrc:  Oral    SpO2: 98%  96% 96%  Weight:      Height:       Weight change:   Physical Examination: General exam:AA0X3,older than stated age, weak appearing. HEENT:Oral mucosa moist,ENT grossly, dentition normal. Respiratory system:b/l diminished, no use of accessory muscle Cardiovascular system:S1 & S2 +, No JVD,. Gastrointestinal system:Abdomen  soft,NT,ND,BS+ Nervous System:Alert, awake, moving extremities and grossly nonfocal Extremities: LE ankle neg,distal peripheral pulses palpable.  Skin: No rashes,no icterus. MSK: Normal muscle bulk,tone, power.   Medications reviewed:  Scheduled Meds:  cyclobenzaprine  5 mg Oral TID   dexamethasone (DECADRON) injection  8 mg Intravenous Q24H   dronabinol  2.5 mg Oral TID AC   escitalopram  5 mg Oral Daily   feeding supplement  237 mL Oral BID BM   HYDROmorphone   Intravenous Q4H   insulin aspart  0-15 Units Subcutaneous TID WC   ketorolac  30 mg Intravenous Q8H   lactulose  13.3333-20 g Oral BID   methadone  10 mg Oral Q8H   metoCLOPramide (REGLAN) injection  5 mg Intravenous Q6H   mometasone-formoterol  2 puff Inhalation BID   montelukast  10 mg Oral QHS   multivitamin with minerals  1 tablet Oral Daily   pantoprazole  40 mg Oral Daily   pregabalin  75 mg Oral TID   senna-docusate  2 tablet Oral BID   traZODone  50 mg Oral QHS  Continuous Infusions:  sodium chloride 75 mL/hr at 08/27/21 1308   heparin 1,500 Units/hr (  08/26/21 2301)    Diet Order             Diet regular Room service appropriate? Yes; Fluid consistency: Thin  Diet effective now                    Nutrition Problem: Increased nutrient needs Etiology: cancer and cancer related treatments Signs/Symptoms: estimated needs Interventions: Ensure Enlive (each supplement provides 350kcal and 20 grams of protein), MVI, Education   Intake/Output Summary (Last 24 hours) at 08/27/2021 1329 Last data filed at 08/27/2021 0833 Gross per 24 hour  Intake 236 ml  Output --  Net 236 ml   Net IO Since Admission: 2,906.28 mL [08/27/21 1329]  Wt Readings from Last 3 Encounters:  08/25/21 72.1 kg  08/01/21 72.1 kg  07/30/21 76.7 kg     Unresulted Labs (From admission, onward)     Start     Ordered   08/28/21 0500  Heparin level (unfractionated)  Daily,   R      08/26/21 2129   08/28/21 0500  Comprehensive  metabolic panel  Daily,   R     Question:  Specimen collection method  Answer:  Lab=Lab collect   08/27/21 0713   08/27/21 0715  CEA  Once,   R       Question:  Specimen collection method  Answer:  Lab=Lab collect   08/27/21 0714   08/27/21 0500  CBC  Daily,   R      08/26/21 1255          Data Reviewed: I have personally reviewed following labs and imaging studies CBC: Recent Labs  Lab 08/23/21 1320 08/25/21 0812 08/26/21 0532 08/27/21 0231  WBC 13.3* 12.8* 9.9 10.0  NEUTROABS 10.0* 10.4*  --   --   HGB 13.2 12.0* 10.3* 11.1*  HCT 39.0 36.3* 32.1* 34.2*  MCV 90.3 91.2 95.5 94.2  PLT 670* 591* 523* 542*   Basic Metabolic Panel: Recent Labs  Lab 08/23/21 1403 08/25/21 0812 08/26/21 0532 08/27/21 0231  NA 130* 132* 136 134*  K 3.5 3.5 3.3* 4.4  CL 98 102 107 106  CO2 20* 20* 19* 21*  GLUCOSE 143* 153* 127* 236*  BUN 12 11 7 9   CREATININE 0.55* 0.49* 0.49* 0.52*  CALCIUM 8.5* 8.4* 6.9* 8.1*   GFR: Estimated Creatinine Clearance: 102.6 mL/min (A) (by C-G formula based on SCr of 0.52 mg/dL (L)). Liver Function Tests: Recent Labs  Lab 08/23/21 1403 08/25/21 0812 08/26/21 0532  AST 20 24 25   ALT 27 25 22   ALKPHOS 742* 710* 644*  BILITOT 0.5 0.9 0.6  PROT 6.2* 6.4* 5.3*  ALBUMIN 3.3* 2.7* 2.2*   Recent Labs  Lab 08/25/21 0812  LIPASE 19   No results for input(s): "AMMONIA" in the last 168 hours. Coagulation Profile: Recent Labs  Lab 08/25/21 0816  INR 1.6*   BNP (last 3 results) No results for input(s): "PROBNP" in the last 8760 hours. HbA1C: Recent Labs    08/25/21 1730  HGBA1C 7.1*   CBG: Recent Labs  Lab 08/26/21 1129 08/26/21 1729 08/26/21 2053 08/27/21 0708 08/27/21 1124  GLUCAP 132* 103* 144* 195* 183*   Lipid Profile: No results for input(s): "CHOL", "HDL", "LDLCALC", "TRIG", "CHOLHDL", "LDLDIRECT" in the last 72 hours. Thyroid Function Tests: No results for input(s): "TSH", "T4TOTAL", "FREET4", "T3FREE", "THYROIDAB" in the  last 72 hours. Sepsis Labs: No results for input(s): "PROCALCITON", "LATICACIDVEN" in the last 168 hours.  Recent Results (from the past  240 hour(s))  Culture, blood (Routine X 2) w Reflex to ID Panel     Status: None (Preliminary result)   Collection Time: 08/25/21  3:01 PM   Specimen: Right Antecubital; Blood  Result Value Ref Range Status   Specimen Description   Final    RIGHT ANTECUBITAL BLOOD Performed at [facility] [address] [place], [place] [address]    Special Requests   Final    Blood Culture adequate volume BOTTLES DRAWN AEROBIC AND ANAEROBIC Performed at [facility], [address] [place], [place] [address]    Culture   Final    NO GROWTH < 24 HOURS Performed at [facility], [place] [address] [place], [place] [address]    Report Status PENDING  Incomplete  Culture, blood (Routine X 2) w Reflex to ID Panel     Status: Abnormal (Preliminary result)   Collection Time: 08/25/21  5:30 PM   Specimen: BLOOD  Result Value Ref Range Status   Specimen Description BLOOD SITE NOT SPECIFIED  Final   Special Requests IN PEDIATRIC BOTTLE Blood Culture adequate volume  Final   Culture  Setup Time   Final    GRAM POSITIVE COCCI IN CLUSTERS IN PEDIATRIC BOTTLE CRITICAL RESULT CALLED TO, READ BACK BY AND VERIFIED WITH: L POINDEXTER,[email or web address]@[id number]  08/26/21 [place]    Culture (A)  Final    STAPHYLOCOCCUS EPIDERMIDIS CULTURE REINCUBATED FOR BETTER GROWTH Performed at [facility], [address] [place], [place] [address]    Report Status PENDING  Incomplete  Blood Culture ID Panel (Reflexed)     Status: Abnormal   Collection Time: 08/25/21  5:30 PM  Result Value Ref Range Status   Enterococcus faecalis NOT DETECTED NOT DETECTED Final   Enterococcus Faecium NOT DETECTED NOT DETECTED Final   Listeria monocytogenes NOT DETECTED NOT DETECTED Final   Staphylococcus species DETECTED (A) NOT DETECTED Final    Comment: CRITICAL RESULT CALLED TO, READ  BACK BY AND VERIFIED WITH: L POINDEXTER,[email or web address]@[email or web address]  08/26/21 [place]    Staphylococcus aureus (BCID) NOT DETECTED NOT DETECTED Final   Staphylococcus epidermidis DETECTED (A) NOT DETECTED Final    Comment: CRITICAL RESULT CALLED TO, READ BACK BY AND VERIFIED WITH: L POINDEXTER,[email or web address]  08/26/21 [place]    Staphylococcus lugdunensis NOT DETECTED NOT DETECTED Final   Streptococcus species NOT DETECTED NOT DETECTED Final   Streptococcus agalactiae NOT DETECTED NOT DETECTED Final   Streptococcus pneumoniae NOT DETECTED NOT DETECTED Final   Streptococcus pyogenes NOT DETECTED NOT DETECTED Final   A.calcoaceticus-baumannii NOT DETECTED NOT DETECTED Final   Bacteroides fragilis NOT DETECTED NOT DETECTED Final   Enterobacterales NOT DETECTED NOT DETECTED Final   Enterobacter cloacae complex NOT DETECTED NOT DETECTED Final   Escherichia coli NOT DETECTED NOT DETECTED Final   Klebsiella aerogenes NOT DETECTED NOT DETECTED Final   Klebsiella oxytoca NOT DETECTED NOT DETECTED Final   Klebsiella pneumoniae NOT DETECTED NOT DETECTED Final   Proteus species NOT DETECTED NOT DETECTED Final   Salmonella species NOT DETECTED NOT DETECTED Final   Serratia marcescens NOT DETECTED NOT DETECTED Final   Haemophilus influenzae NOT DETECTED NOT DETECTED Final   Neisseria meningitidis NOT DETECTED NOT DETECTED Final   Pseudomonas aeruginosa NOT DETECTED NOT DETECTED Final   Stenotrophomonas maltophilia NOT DETECTED NOT DETECTED Final   Candida albicans NOT DETECTED NOT DETECTED Final   Candida auris NOT DETECTED NOT DETECTED Final   Candida glabrata NOT DETECTED NOT DETECTED Final   Candida krusei  NOT DETECTED NOT DETECTED Final   Candida parapsilosis NOT DETECTED NOT DETECTED Final   Candida tropicalis NOT DETECTED NOT DETECTED Final   Cryptococcus neoformans/gattii NOT DETECTED NOT DETECTED Final   Methicillin resistance mecA/C NOT DETECTED NOT DETECTED Final    Comment: Performed at [facility],  [place] [address] [place], [place] [address]    Antimicrobials: Anti-infectives (From admission, onward)    Start     Dose/Rate Route Frequency Ordered Stop   08/25/21 1400  piperacillin-tazobactam (ZOSYN) IVPB 3.375 g  Status:  Discontinued        3.375 g 12.5 mL/hr over 240 Minutes Intravenous Every 8 hours 08/25/21 1338 08/25/21 1700      Culture/Microbiology    Component Value Date/Time   SDES BLOOD SITE NOT SPECIFIED 08/25/2021 1730   SPECREQUEST IN PEDIATRIC BOTTLE Blood Culture adequate volume 08/25/2021 1730   CULT (A) 08/25/2021 1730    STAPHYLOCOCCUS EPIDERMIDIS CULTURE REINCUBATED FOR BETTER GROWTH Performed at [facility], [place] [address], [place], [place] [address]    REPTSTATUS PENDING 08/25/2021 1730    Other culture-see note  Radiology Studies: MR LIVER W WO CONTRAST  Result Date: 08/27/2021 CLINICAL DATA:  Inpatient. Metastatic non-small cell lung cancer. Indeterminate liver lesions on recent sonogram. Progression of metastatic disease in the liver, lymph nodes and bones on CT from 1 day prior. EXAM: MRI ABDOMEN WITHOUT AND WITH CONTRAST TECHNIQUE: Multiplanar multisequence MR imaging of the abdomen was performed both before and after the administration of intravenous contrast. CONTRAST:  12mL GADAVIST GADOBUTROL 1 MMOL/ML IV SOLN COMPARISON:  06/22/2021 PET-CT. 08/24/2021 abdominal sonogram. 08/25/2021 CT abdomen/pelvis. FINDINGS: Lower chest: Solid 0.5 cm medial dependent right lung base pulmonary nodule (series 8/image 5) as better seen on recent CT. Hepatobiliary: Mild hepatomegaly. Background diffuse hepatic hemosiderosis. Innumerable (> 30) enhancing solid liver masses scattered throughout the liver, confluent in the right liver, with restricted diffusion, compatible with metastatic disease, generally new since 06/18/2021 CT. Representative 2.0 x 1.8 cm caudate lobe mass (series 5/image 9). Representative segment 7 right liver 2.9 x 2.1 cm mass (series 5/image 9).  Representative 1.5 x 1.3 cm segment 4B left liver mass (series 5/image 16). Small amount of layering sludge in gallbladder. Mild diffuse gallbladder wall thickening, asymmetrically prominent adjacent to the liver. No cholelithiasis. No pericholecystic fluid. Mild-to-moderate intrahepatic biliary ductal dilatation throughout the right liver with relative sparing of the left liver, with central extrinsic biliary compression by the confluent central right liver metastases. Top normal caliber common bile duct (6 mm diameter) with smooth distal tapering. No choledocholithiasis. Pancreas: No pancreatic mass or duct dilation.  No pancreas divisum. Spleen: Normal size spleen. Diffuse splenic hemosiderosis. A few scattered tiny T2 hyperintense subcentimeter splenic lesions are unchanged from 06/18/2021 CT, considered benign. Adrenals/Urinary Tract: Normal adrenals. No hydronephrosis. Normal kidneys with no renal mass. Stomach/Bowel: Normal non-distended stomach. Visualized small and large bowel is normal caliber, with no bowel wall thickening. Vascular/Lymphatic: Normal caliber abdominal aorta. Patent renal, splenic and hepatic veins. Extrinsic narrowing of the right portal vein tree by the confluent right liver metastatic disease. Portal veins appear grossly patent, with evaluation limited by motion degradation. Enlarged 1.7 cm short axis diameter portacaval node (series 8/image 16) and a few mildly enlarged aortocaval nodes, largest 1.0 cm (series 8/image 24), all newly enlarged since 06/18/2021 CT. Other: No abdominal ascites or focal fluid collection. Musculoskeletal: Several enhancing osseous lesions throughout the visualized axial skeleton, for example 2.4 cm in the T11 vertebral body (series  3/image 23), not clearly seen on 06/18/2021 CT, and expansile 4.3 cm right iliac crest lesion (series 3/image 14), increased from 0.8 cm on 06/18/2021 CT. IMPRESSION: 1. Innumerable (> 30) enhancing liver masses scattered  throughout the liver, confluent in the right liver, compatible with metastatic disease, generally new since 06/18/2021 CT. 2. Several enhancing osseous lesions throughout the visualized axial skeleton, increased from 06/18/2021 CT, compatible with progressive multifocal osseous metastatic disease. 3. Mild-to-moderate intrahepatic biliary ductal dilatation throughout the right liver and extrinsic narrowing of the right portal vein tree by the confluent central right liver metastatic disease. Top normal caliber common bile duct (6 mm diameter) with smooth distal tapering. No choledocholithiasis. 4. Small amount of gallbladder sludge. Nonspecific mild gallbladder wall thickening, potentially reactive or due to noninflammatory edema. No cholelithiasis. 5. 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 Electronically Signed   By: Ilona Sorrel M.D.   On: 08/27/2021 08:22   MR Lumbar Spine W Wo Contrast  Result Date: 08/25/2021 CLINICAL DATA:  Worsening back and right leg pain. History of metastatic lung cancer. EXAM: MRI THORACIC AND LUMBAR SPINE WITHOUT AND WITH CONTRAST TECHNIQUE: Multiplanar and multiecho pulse sequences of the thoracic and lumbar spine were obtained without and with intravenous contrast. CONTRAST:  50mL GADAVIST GADOBUTROL 1 MMOL/ML IV SOLN COMPARISON:  CT lumbar myelogram dated August 20, 2021. MRI thoracic and lumbar spine dated Jul 30, 2021. FINDINGS: MRI THORACIC SPINE FINDINGS Alignment:  Physiologic. Vertebrae: T3, T6, T8, and T11 metastases have increased in size. New tiny lesions at T10 and T12 (series 17, images 5 and 7). No epidural tumor extension. No fracture or evidence of discitis. Cord: Normal signal and morphology. No abnormal intrathecal enhancement. Paraspinal and other soft tissues: Negative. Disc levels: Unchanged small right paracentral disc protrusions at T1-T2 and T2-T3. No spinal canal or neuroforaminal stenosis at any level. MRI LUMBAR SPINE FINDINGS Segmentation:  Standard. Alignment:   Unchanged mild retrolisthesis at L3-L4. Vertebrae: L4 and S2 vertebral body metastases have increased in size. New L1, L2, L3, S1, and S3 vertebral body metastases. No epidural tumor extension. No fracture or evidence of discitis. Conus medullaris: Extends to the L1-L2 level and appears normal. No abnormal intrathecal enhancement. Paraspinal and other soft tissues: Enlarging metastases in the bilateral posterior iliac bones. Enlarging upper abdominal lymphadenopathy. Disc levels: T12-L1:  Negative. L1-L2:  Negative. L2-L3: Negative disc. Unchanged mild bilateral facet arthropathy. No stenosis. L3-L4: Unchanged mild disc bulging with superimposed right subarticular disc protrusion. Unchanged mild bilateral facet arthropathy. Unchanged mild spinal canal stenosis. Unchanged moderate right and mild left lateral recess stenosis. Unchanged mild bilateral neuroforaminal stenosis. L4-L5: Prior left hemilaminectomy. Unchanged mild disc bulging with superimposed right extraforaminal disc protrusion contacting the exiting right L4 nerve root. Unchanged mild right lateral recess and bilateral neuroforaminal stenosis. No spinal canal stenosis. L5-S1: Unchanged small left paracentral disc protrusion and mild bilateral facet arthropathy. No stenosis. IMPRESSION: 1. New and progressive thoracic and lumbosacral osseous metastatic disease without significant extraosseous extension or pathologic fracture. 2. Enlarging bilateral iliac bone and upper abdominal nodal metastatic disease. 3. Unchanged multilevel lumbar spondylosis as described above. Right extraforaminal disc protrusion contacts and potentially irritates the exiting right L4 nerve root. Electronically Signed   By: [place] Dubin M.D.   On: 08/25/2021 14:55   MR THORACIC SPINE W WO CONTRAST  Result Date: 08/25/2021 CLINICAL DATA:  Worsening back and right leg pain. History of metastatic lung cancer. EXAM: MRI THORACIC AND LUMBAR SPINE WITHOUT AND WITH CONTRAST  TECHNIQUE: Multiplanar and  multiecho pulse sequences of the thoracic and lumbar spine were obtained without and with intravenous contrast. CONTRAST:  30mL GADAVIST GADOBUTROL 1 MMOL/ML IV SOLN COMPARISON:  CT lumbar myelogram dated August 20, 2021. MRI thoracic and lumbar spine dated Jul 30, 2021. FINDINGS: MRI THORACIC SPINE FINDINGS Alignment:  Physiologic. Vertebrae: T3, T6, T8, and T11 metastases have increased in size. New tiny lesions at T10 and T12 (series 17, images 5 and 7). No epidural tumor extension. No fracture or evidence of discitis. Cord: Normal signal and morphology. No abnormal intrathecal enhancement. Paraspinal and other soft tissues: Negative. Disc levels: Unchanged small right paracentral disc protrusions at T1-T2 and T2-T3. No spinal canal or neuroforaminal stenosis at any level. MRI LUMBAR SPINE FINDINGS Segmentation:  Standard. Alignment:  Unchanged mild retrolisthesis at L3-L4. Vertebrae: L4 and S2 vertebral body metastases have increased in size. New L1, L2, L3, S1, and S3 vertebral body metastases. No epidural tumor extension. No fracture or evidence of discitis. Conus medullaris: Extends to the L1-L2 level and appears normal. No abnormal intrathecal enhancement. Paraspinal and other soft tissues: Enlarging metastases in the bilateral posterior iliac bones. Enlarging upper abdominal lymphadenopathy. Disc levels: T12-L1:  Negative. L1-L2:  Negative. L2-L3: Negative disc. Unchanged mild bilateral facet arthropathy. No stenosis. L3-L4: Unchanged mild disc bulging with superimposed right subarticular disc protrusion. Unchanged mild bilateral facet arthropathy. Unchanged mild spinal canal stenosis. Unchanged moderate right and mild left lateral recess stenosis. Unchanged mild bilateral neuroforaminal stenosis. L4-L5: Prior left hemilaminectomy. Unchanged mild disc bulging with superimposed right extraforaminal disc protrusion contacting the exiting right L4 nerve root. Unchanged mild right lateral  recess and bilateral neuroforaminal stenosis. No spinal canal stenosis. L5-S1: Unchanged small left paracentral disc protrusion and mild bilateral facet arthropathy. No stenosis. IMPRESSION: 1. New and progressive thoracic and lumbosacral osseous metastatic disease without significant extraosseous extension or pathologic fracture. 2. Enlarging bilateral iliac bone and upper abdominal nodal metastatic disease. 3. Unchanged multilevel lumbar spondylosis as described above. Right extraforaminal disc protrusion contacts and potentially irritates the exiting right L4 nerve root. Electronically Signed   By: [place] Dubin M.D.   On: 08/25/2021 14:55     LOS: 1 day   Antonieta Pert, MD [facility]  08/27/2021, 1:29 PM

## 2021-08-27 NOTE — Progress Notes (Signed)
ANTICOAGULATION CONSULT NOTE   Pharmacy Consult for heparin Indication:  VTE treatment  (portal vein thrombosis)  Allergies  Allergen Reactions   Gabapentin Swelling    Leg swelling    Levetiracetam Other (See Comments)    Personality changes   Pregabalin Swelling    Leg swelling    Bupropion Nausea And Vomiting   Duloxetine Other (See Comments)    Excessive sleeping   Ezetimibe Other (See Comments)    Unknown reaction   Other Other (See Comments)    Antibiotics - per wife at bedside, patient will develop C-diff if given "any antibiotic"   Prochlorperazine Nausea And Vomiting   Lamotrigine Itching and Rash    Patient Measurements: Height: 6' (182.9 cm) Weight: 72.1 kg (159 lb) IBW/kg (Calculated) : 77.6 Heparin Dosing Weight: 72.1 kg  Vital Signs: Temp: 97.7 F (36.5 C) (06/08 2050) Temp Source: Oral (06/08 1727) BP: 123/78 (06/08 2050) Pulse Rate: 74 (06/08 2050)  Labs: Recent Labs    08/25/21 4854 08/25/21 0816 08/25/21 1730 08/26/21 0532 08/26/21 0613 08/26/21 1343 08/26/21 2029 08/27/21 0231  HGB 12.0*  --   --  10.3*  --   --   --  11.1*  HCT 36.3*  --   --  32.1*  --   --   --  34.2*  PLT 591*  --   --  523*  --   --   --  556*  APTT  --  38*  --   --   --   --   --   --   LABPROT  --  18.9*  --   --   --   --   --   --   INR  --  1.6*  --   --   --   --   --   --   HEPARINUNFRC  --   --    < >  --    < > 0.80* 0.57 0.54  CREATININE 0.49*  --   --  0.49*  --   --   --  0.52*   < > = values in this interval not displayed.     Estimated Creatinine Clearance: 102.6 mL/min (A) (by C-G formula based on SCr of 0.52 mg/dL (L)).   Medical History: Past Medical History:  Diagnosis Date   Chronic back pain    Chronic GERD    Chronic pain    COPD (chronic obstructive pulmonary disease) (HCC)    Coronary artery disease    Depression    Diabetes mellitus without complication (HCC)    Gastroparesis    Goals of care, counseling/discussion 01/27/2021    High risk medication use    Hyperlipidemia    MI (myocardial infarction) ([facility])    Non-small cell lung cancer metastatic to brain ([place]) 01/27/2021   Restless leg syndrome     Assessment: 61 year old male presented with abdominal pain. Patient with history of NSCLC with mets to brain/liver/spine/hip. 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 Recent craniotomy in April 2023 at [facility]; [facility] spoke with neurosurgery team there who state no contraindication for anticoagulation from their standpoint. Pharmacy consulted to start heparin.  No anticoagulation noted PTA.   08/27/2021: Heparin level = 0.52 (therapeutic) with heparin gtt @ 1500 units/hr CBC: Hgb low/decreased to 11.1, Pltc elevated No bleeding or infusion issues noted    Goal of Therapy:  Heparin level 0.3-0.7 units/ml Monitor platelets by anticoagulation protocol: Yes   Plan:  -  Continue heparin infusion at 1500 units/hr -Daily CBC, heparin level  -Monitor closely for s/sx of bleeding   Leone Haven, PharmD 08/27/2021 3:41 AM

## 2021-08-27 NOTE — Discharge Instructions (Signed)
High Calorie, High Protein Nutrition Therapy  A high-calorie, high-protein diet has been recommended to you. Your registered dietitian nutritionist (RDN) may have recommended this diet because you are having difficulty eating enough calories throughout the day, you have lost weight, and/or you need to add protein to your diet.  Sometimes you may not feel like eating, even if you know the importance of good nutrition. The recommendations in this handout can help you with the following:  Regaining your strength and energy Keeping your body healthy Healing and recovering from surgery or illness and fighting infection  Schedule Your Meals and Snacks  Several small meals and snacks are often better tolerated and digested than large meals.  Strategies  Plan to eat 3 meals and 3 snacks daily. Experiment with timing meals to find out when you have a larger appetite. Appetite may be greatest in the morning after not eating all night so you may prefer to eat your larger meals and snacks in the morning and at lunch. Breakfast-type foods are often better tolerated so eat foods such as eggs, pancakes, waffles and cereal for any meal or snack. Carry snacks with you so you are prepared to eat every 2 to 3 hours. Determine what works best for you if your body's cues for feeling hungry or full are not working. Eat a small meal or snack even if you don't feel hungry. Set a timer to remind you when it is time to eat. Take a walk before you eat (with health care provider's approval). Light or moderate physical activity can help you maintain muscle and increase your appetite.  Make Eating Enjoyable  Taking steps to make the experience enjoyable may help to increase your interest in eating and improve your appetite.  Strategies:  Eat with others whenever possible. Include your favorite foods to make meals more enjoyable. Try new foods. Save your beverage for the end of the meal so that you have more  room for food before you get full.  Add Calories to Your Meals and Snacks  Try adding calorie-dense foods so that each bite provides more nutrition.  Strategies  Drink milk, chocolate milk, soy milk, or smoothies instead of low-calorie beverages such as diet drinks or water. Cook with milk or soy milk instead of water when making dishes such as hot cereal, cocoa, or pudding. Add jelly, jam, honey, butter or margarine to bread and crackers. Add jam or fruit to ice cream and as a topping over cake. Mix dried fruit, nuts, granola, honey, or dry cereal with yogurt or hot cereals. Enjoy snacks such as milkshakes, smoothies, pudding, ice cream, or custard. Blend a fruit smoothie of a banana, frozen berries, milk or soy milk, and 1 tablespoon nonfat powdered milk or protein powder.  Add Protein to Your Meals and Snacks  Choose at least one protein food at each meal and snack to increase your daily intake.  Strategies  Add  cup nonfat dry milk powder or protein powder to make a high-protein milk to drink or to use in recipes that call for milk. Vanilla or peppermint extract or unsweetened cocoa powder could help to boost the flavor. Add hard-cooked eggs, leftover meat, grated cheese, canned beans or tofu to noodles, rice, salads, sandwiches, soups, casseroles, pasta, tuna and other mixed dishes. Add powdered milk or protein powder to hot cereals, meatloaf, casseroles, scrambled eggs, sauces, cream soups, and shakes. Add beans and lentils to salads, soups, casseroles, and vegetable dishes. Eat cottage cheese or yogurt, especially  [place] yogurt, with fruit as a snack or dessert. Eat peanut or other nut butters on crackers, bread, toast, waffles, apples, bananas or celery sticks. Add it to milkshakes, smoothies, or desserts. Consider a ready-made protein shake.   Add Fats to Your Meals and Snacks  Try adding fats to your meals and snacks. Fat provides more calories in fewer bites than  carbohydrate or protein and adds flavors to your foods.  Strategies  Snack on nuts and seeds or add them to foods like salads, pasta, cereals, yogurt, and ice cream.  Saut or stir-fry vegetables, meats, chicken, fish or tofu in olive or canola oil.  Add olive oil, other vegetable oils, butter or margarine to soups, vegetables, potatoes, cooked cereal, rice, pasta, bread, crackers, pancakes, or waffles. Snack on olives or add to pasta, pizza, or salad. Add avocado or guacamole to your salads, sandwiches, and other entrees. Include fatty fish such as salmon in your weekly meal plan.  Tips For Adding Protein Nutrition Therapy    Tips Add extra egg to one or more meals  Increase the portion of milk to drink and change to skim milk if able  Include [place] yogurt or cottage cheese for snack or part of a meal  Increase portion size of protein entre and decrease portion of starch/bread  Mix protein powder, nut butter, almond/nut milk, non-fat dry milk, or Greek yogurt to shakes and smoothies  Use these ingredients also in baked goods or other recipes Use double the amount of sandwich filling  Add protein foods to all snacks including cheese, nut butters, milk and yogurt  Food Tips for Including Protein  Beans Cook and use dried peas, beans, and tofu in soups or add to casseroles, pastas, and grain dishes that also contain cheese or meat  Mash with cheese and milk  Use tofu to make smoothies  Commercial Protein Supplements Use nutritional supplements or protein powder sold at pharmacies and grocery stores  Use protein powder in milk drinks and desserts, such as pudding  Mix with ice cream, milk, and fruit or other flavorings for a high-protein milkshake  Cottage Cheese or [employer] Mix with or use to stuff fruits and vegetables  Add to casseroles, spaghetti, noodles, or egg dishes such as omelets, scrambled eggs, and souffls  Use gelatin, pudding-type desserts, cheesecake, and pancake  or waffle batter  Use to stuff crepes, pasta shells, or manicotti  Puree and use as a substitute for sour cream  Eggs, Egg whites, and Egg Yolks Add chopped, hard-cooked eggs to salads and dressings, vegetables, casseroles, and creamed meats  Beat eggs into mashed potatoes, vegetable purees, and sauces  Add extra egg whites to quiches, scrambled eggs, custards, puddings, pancake batter, or [place] toast wash/batter  Make a rich custard with egg yolks, double strength milk, and sugar  Add extra hard-cooked yolks to deviled egg filling and sandwich spreads  Hard or Semi-Soft Cheese (Cheddar, Barnabas Lister, Barrera) Melt on sandwiches, bread, muffins, tortillas, hamburgers, hot dogs, other meats or fish, vegetables, eggs, or desserts such as stewed figs or pies  Grate and add to soups, sauces, casseroles, vegetable dishes, potatoes, rice noodles, or meatloaf  Serve as a snack with crackers or bagels  Ice cream, Yogurt, and Frozen Yogurt Add to milk drinks such as milkshakes  Add to cereals, fruits, gelatin desserts, and pies  Blend or whip with soft or cooked fruits  Sandwich ice cream or frozen yogurt between enriched cake slices, cookies, or graham crackers  Use seasoned  yogurt as a dip for fruits, vegetables, or chips  Use yogurt in place of sour cream in casseroles  Meat and Fish Add chopped, cooked meat or fish to vegetables, salads, casseroles, soups, sauces, and biscuit dough  Use in omelets, souffls, quiches, and sandwich fillings  Add chicken and [place] to stuffing  Wrap in pie crust or biscuit dough as turnovers  Add to stuffed baked potatoes  Add pureed meat to soups  Milk Use in beverages and in cooking  Use in preparing foods, such as hot cereal, soups, cocoa, or pudding  Add cream sauces to vegetable and other dishes  Use evaporated milk, evaporated skim milk, or sweetened condensed milk instead of milk or water in recipes.  Nonfat Dry Milk Add 1/3 cup of nonfat dry milk powdered milk to  each cup of regular milk for "double strength" milk  Add to yogurt and milk drinks, such as pasteurized eggnog and milkshakes  Add to scrambled eggs and mashed potatoes  Use in casseroles, meatloaf, hot cereal, breads, muffins, sauces, cream soups, puddings and custards, and other milk-based desserts  Nuts, Seeds, and Wheat Germ Add to casseroles, breads, muffins, pancakes, cookies, and waffles  Sprinkle on fruit, cereal, ice cream, yogurt, vegetables, salads, and toast as a crunchy topping  Use in place of breadcrumbs  Blend with parsley or spinach, herbs, and cream for a noodle, pasta, or vegetable sauce.  Roll banana in chopped nuts  Peanut Butter Spread on sandwiches, toast, muffins, crackers, waffles, pancakes, and fruit slices  Use as a dip for raw vegetables, such as carrots, cauliflower, and celery  Blend with milk drinks, smoothies, and other beverages  Swirl through soft ice cream or yogurt  Spread on a banana then roll in crushed, dry cereal or chopped nuts   Small Meal and Snack Ideas  These snacks and meals are recommended when you have to eat but aren't necessarily hungry.  They are good choices because they are high in protein and high in calories.   2 graham crackers, 2 tablespoons peanut or other nut butter, 1 cup milk  cup [place] yogurt,  cup fruit,  cup granola 2 deviled egg halves, 5 whole wheat crackers 1 cup cream of tomato soup,  grilled cheese sandwich 1 toasted waffle topped with: 2 tablespoons peanut or nut butter, 1 tablespoon jam Trail mix made with:  cup nuts,  cup dried fruit,  cup cold cereal, any variety  cup oatmeal or cream of wheat cereal, 1 tablespoon peanut or nut butter,  cup diced fruit  High-Calorie, High-Protein Sample 1-Day Menu   Breakfast 1 egg, scrambled 1 ounce cheddar cheese 1 English muffin, whole wheat 1 tablespoon margarine 1 tablespoon jam  cup orange juice, fortified with calcium and vitamin D  Morning Snack 1  tablespoon peanut butter 1 banana 1 cup 1% milk  Lunch Tuna salad sandwich made with: 2 slices bread, whole wheat 3 ounces tuna mixed with: 1 tablespoon mayonnaise  cup pudding  Afternoon Snack  cup hummus  cup carrots 1 pita  Evening Meal Enchilada casserole made with: 2 corn tortillas 3 ounces ground beef, cooked  cup black beans, cooked  cup corn, cooked 1 ounce grated cheddar cheese  cup enchilada sauce  avocado, sliced, topping for enchilada 1 tablespoon sour cream, topping for enchilada Salad:  cup lettuce, shredded  cup tomatoes, chopped, for salad 1 tablespoon olive oil and vinegar dressing, for salad  Evening Snack  cup Greek yogurt  cup blueberries  cup granola  Copyright 2020  Academy of Nutrition and Dietetics. All rights reserved  ________________________________________________________________  Information on my medicine - XARELTO (rivaroxaban)  This medication education was reviewed with me or my healthcare representative as part of my discharge preparation.    WHY WAS XARELTO PRESCRIBED FOR YOU? Xarelto was prescribed to treat blood clots that may have been found in the veins of your legs (deep vein thrombosis) or in your lungs (pulmonary embolism) and to reduce the risk of them occurring again.  What do you need to know about Xarelto? The starting dose is one 15 mg tablet taken TWICE daily with food for the FIRST 21 DAYS then on 09/21/21  the dose is changed to one 20 mg tablet taken ONCE A DAY with your evening meal.  DO NOT stop taking Xarelto without talking to the health care provider who prescribed the medication.  Refill your prescription for 20 mg tablets before you run out.  After discharge, you should have regular check-up appointments with your healthcare provider that is prescribing your Xarelto.  In the future your dose may need to be changed if your kidney function changes by a significant amount.  What do you do if you  miss a dose? If you are taking Xarelto TWICE DAILY and you miss a dose, take it as soon as you remember. You may take two 15 mg tablets (total 30 mg) at the same time then resume your regularly scheduled 15 mg twice daily the next day.  If you are taking Xarelto ONCE DAILY and you miss a dose, take it as soon as you remember on the same day then continue your regularly scheduled once daily regimen the next day. Do not take two doses of Xarelto at the same time.   Important Safety Information Xarelto is a blood thinner medicine that can cause bleeding. You should call your healthcare provider right away if you experience any of the following: Bleeding from an injury or your nose that does not stop. Unusual colored urine (red or dark brown) or unusual colored stools (red or black). Unusual bruising for unknown reasons. A serious fall or if you hit your head (even if there is no bleeding).  Some medicines may interact with Xarelto and might increase your risk of bleeding while on Xarelto. To help avoid this, consult your healthcare provider or pharmacist prior to using any new prescription or non-prescription medications, including herbals, vitamins, non-steroidal anti-inflammatory drugs (NSAIDs) and supplements.  This website has more information on Xarelto: [email or web address]

## 2021-08-27 NOTE — Progress Notes (Incomplete)
Introduction: [name] admitted on 08/25/21 w/ complaints of intractable back and abdominal pain.  History of present illness:  Patient complains of intractable pain radiating from his back to his abdomen and RLE. Pain attributed to degeneration of lumbar spine. Patient has  NSCLC metastases to brain and sacral bone Patient reports taking 2 Percocet 10's Q4H w/out relief PTA. Also reports N/V and reduced appetite as well as weakness since his prior admission to [facility]  He has an admit to [facility] on 08/05/21 with similar intractable pain in the back, abdomen and leg. Patient was discharged in good condition and his oxycodone 15 mg IR was increased in frequency along with his Ginger Organ was altered to be taken Q8H. Clinic visit on 08/13/21 notes pain from herniated lumbar disc and weight loss. This visit added Marinol 2.5 mg PO TID and dexamethasone 8 mg PO BID.   As of 08/27/21 patient is unable to walk d/t RLE pain, but generally reports that his pain is much improved since admit is currently treated w/ oxycodone 10 mg Q4H PRN. Methadone 10 mg Q8H, Ketorolac 30 mg IV Q8H, dexamethasone 8 mg IV daily, Dilaudid 2 mg Q3H PRN. Past medical history/allergies/family history pertinent to current illness: Status post radiosurgery-  SBRT given on 04/13/2021  Craniotomy on 06/30/21 Lung lobectomy (LU) on 09/02/20 PTA meds:  Social hx: - Smoker Physical exam/relevant labs, diagnostic testing/imaging: - Liver MR found mass  - Thoracic Spine MR: T3,T6, T8, and T11 metastases have enlarged, new metastases on L1, 2, 3, and S1 - CT abdomen: lung nodules in right lower lobe,  - Hx of: NSCLC, chronic pain, tobacco use, T2DM, anxiety/depression, GERD, portal vein thrombosis -  Blood culture: Staph epidermidis (likely contaminant) CBC    Component Value Date/Time   WBC 10.0 08/27/2021 0231   RBC 3.63 (L) 08/27/2021 0231   HGB 11.1 (L) 08/27/2021 0231   HGB 13.2 08/23/2021 1320   HCT 34.2 (L) 08/27/2021 0231   PLT 556 (H)  08/27/2021 0231   PLT 670 (H) 08/23/2021 1320   MCV 94.2 08/27/2021 0231   MCH 30.6 08/27/2021 0231   MCHC 32.5 08/27/2021 0231   RDW 15.2 08/27/2021 0231   LYMPHSABS 1.3 08/25/2021 0812   MONOABS 0.9 08/25/2021 0812   EOSABS 0.0 08/25/2021 0812   BASOSABS 0.1 08/25/2021 0812  Scr: 0.52 BG: 183  Problem based assessment and plan:  - Problem 1: Intractable Pain  A. Assessment: Patient on high doses of opioids, both ER and IR as well as IV dexamethasone and has experienced substantial pain improvement since his admission w/ pain management. However, he is still unable to walk d/t pain in RLE when he attempts to do so. Pain attributed in part to degeneration of the spine. A nerve block is scheduled to help control patient's pain  B. Plan: - Continue pain regimen - Defer to oncology for further pain mgmt - Monitor for oversedation, respiratory depression - If no BM by end of day 08/28/21 initiate naloxegol 25 mg QAM -  - Monitor for S/Sx of pain - Monitor BG w/ dexamethasone use - Problem 2:  . Assessment  B. Plan:  - Problem 3: Portal vein thrombus  A. Assessment: Patient evinces new portal vein thrombus for which he is currently receiving heparin 1500 units/hr. Last heparin level therapeutic (0.54) as was prior level  B. Plan: - monitor heparin levels daily - Monitor CBC Daily

## 2021-08-27 NOTE — Progress Notes (Signed)
  Transition of Care [facility]) Screening Note   Patient Details  Name: Bill Garza Date of Birth: 26-Apr-1960   [facility] Care [facility]) CM/SW Contact:    Vassie Moselle, LCSW Phone Number: 08/27/2021, 3:03 PM    Transition of Care Department [facility]) has reviewed patient and no TOC needs have been identified at this time. We will continue to monitor patient advancement through interdisciplinary progression rounds. If new patient transition needs arise, please place a TOC consult.

## 2021-08-27 NOTE — Progress Notes (Signed)
Patient is drowsy, arousable to name. On-call MD paged and was asked to hold PCA and give 1 dose of Narcan. PCA stopped at this time

## 2021-08-27 NOTE — Progress Notes (Signed)
Palliative Care Reason:Complex Pain and Symptom Management  Discussed case with [name] on 6/8. [name] shared with me that  patient and family "not ready" for palliative care and that he will contact me if needed. However today RN stated that he continues to have pain and that family had agreed follow-up.   Bill Garza looks much better than when I saw him on 6/7-he continues to have significant pain although with high dose opioids it is tolerable. He is requiring 2 mg hydromorphone boluses almost every 2 hours and is also getting oxycodone and methadone. Last PM nursing was concerned about mental status changes and low respirations so scheduled and prn dosing was held. Today he is sitting on the side of the bed, he is able to engaged and discuss his goals of care with me.  He expresses that his goal is to continue to get aggressive intervention for his pain so that he can receive treatment for his cancer. I took the opportunity to address their concerns raised in our initial encounter over end of life issues.They did not receive conversation about this his well, but given his condition at admission, they now understand why it was important for me to discuss with them and why. Fortunately he looks better today and he is eating and more alert and engaged. I reassured them that our team's goal is to support and help him get to treatment if he is able and pain control is going to be a major component of his care plan moving forward. They have a previously established relationship with Bill Oman NP in our clinic and they have expressed a desire to have our team participate in pain management.   Recommendations: Patient and his wife tell me they are getting a nerve root ablation on Monday. I however upon further investigation do not see IR note and he is not on any procedure schedule. I called and spoke with Dr. Lorenza Chick directly, we discussed his case in detail and they will see today for eval and  management. RFA is not done inpatient, however we discussed doing a trans-foraminal epidural injection at the L4-L5 nerve root impingement first. Will also possibly consider Osteocool for Right IC and larger areas of bone mets in Thoracic and lumbar spine-this will need insurance authorization and may take some time to get it done. Start PCA Hydromorphone  4mg  hour dose max-this is safer given restarting his methadone and with respiration concerns expressed by nursing. Continue steroids, torado and reglan.  Lane Hacker, DO Palliative Medicine

## 2021-08-27 NOTE — Telephone Encounter (Signed)
Pt wife called requesting more information on pt plan of care and requesting a note for jury duty. Note for jury duty provided to pt room, denise, wife, made aware. [name] informed Lexine Baton, NP, and I that pt has to do an ablation in the outpatient setting, questions regarding that procedure were answered. Langley Gauss also informed us that pt had received an epidural injection recently as well. Pt wife had no further questions at this time, pt wife knows to reach out with any concerns.

## 2021-08-27 NOTE — Progress Notes (Signed)
Patient is lethargic, RR drops 7-8 when sleeping, will hold PCA and give one dose of narcan. Addnedum: - this am patient is awake and walking, asking for his PCA back, will resume at a lower dose, 0.5 mg Q41minutes, max 2 mg /hour, and palliative medicine to adjust further if needed. Phillips Climes MD

## 2021-08-27 NOTE — Progress Notes (Signed)
Initial Nutrition Assessment  INTERVENTION:   -Ensure Plus High Protein po BID, each supplement provides 350 kcal and 20 grams of protein.   -Multivitamin with minerals daily  -Placed "High Calorie, High Protein" handout in AVS  NUTRITION DIAGNOSIS:   Increased nutrient needs related to cancer and cancer related treatments as evidenced by estimated needs.  GOAL:   Patient will meet greater than or equal to 90% of their needs  MONITOR:   PO intake, Supplement acceptance, Labs, Weight trends, I & O's  REASON FOR ASSESSMENT:   Consult Assessment of nutrition requirement/status  ASSESSMENT:   61 year old male with a past medical history significant for metastatic lung cancer with mets to the brain, liver, spine, hip, chronic pain, tobacco dependence, diabetes mellitus, COPD, anxiety/depression, GERD.  He presented to the emergency department with back and abdominal pain.  Patient in room. Pt states his wife can give a better history than he can and  requests RD return when wife is back. Today he reports she won't be back until the evening.  Pt states he did eat some breakfast this morning. Has lunch on the way. Per oncology, they have started him on Marinol.  Pt reports not really liking Ensure supplements but is willing to try a chocolate flavored one again.   Per weight records, pt has lost 14 lbs since 4/10 (8% wt loss x 2 months, significant for time frame).   Medications: Marinol, Lactulose, Reglan, Senokot, Zofran  Labs reviewed:  CBGs: 144-195 Low Na  NUTRITION - FOCUSED PHYSICAL EXAM:  Pt declines, having a lot of pain. Asked RD to return when wife present.  Diet Order:   Diet Order             Diet regular Room service appropriate? Yes; Fluid consistency: Thin  Diet effective now                   EDUCATION NEEDS:   Education needs have been addressed  Skin:  Skin Assessment: Reviewed RN Assessment  Last BM:  6/5  Height:   Ht Readings from  Last 1 Encounters:  08/25/21 6' (1.829 m)    Weight:   Wt Readings from Last 1 Encounters:  08/25/21 72.1 kg    BMI:  Body mass index is 21.56 kg/m.  Estimated Nutritional Needs:   Kcal:  2000-2200  Protein:  100-110g  Fluid:  2L/day  Clayton Bibles, MS, RD, LDN Inpatient Clinical Dietitian Contact information available via Amion

## 2021-08-27 NOTE — Progress Notes (Addendum)
     Mr. Wuebker is being followed for ongoing symptom management and Complex pain. I stopped in to check on patient's symptom and offer additional support. He was also seen by my colleague Dr. Hilma Favors.   During visit his daughter and her boyfriend was visiting. Mr. Brow expressed appreciation of our visit and his happiness in seeing [place]. He introduces myself and [name], RN to his family as his "[place] [facility] team". We establishes an outpatient relationship at [facility] for symptom management support. His shares he sent his wife home to get some rest. Mr. Renwick is sitting up on the side of the bed eating an [place] ice. His daughter is assisting him with ordering dinner. Appetite improving.   We reviewed plan of care. He was started on dilaudid PCA per Dr. Hilma Favors. Mr. Kerce is appreciative of all the care and support he is receiving. I am happy to see that he is achieving some pain relief as I know he has had a difficult time over the past few days-months.   He is remaining hopeful for ongoing stability and control. Is looking forward to hearing from the IR team and their recommendations.   As requested Madaline Savage duty exemption note written for his wife and left in the room with patient.   Mr. Tippy is aware that myself and Dr. Hilma Favors are not on service this weekend however, I will check in with him on Monday as he requested.    Alda Lea, AGPCNP-BC  [facility]  No Charge

## 2021-08-27 NOTE — Progress Notes (Signed)
The pain might be a little bit better.  Again is hard to say exactly.  He is going to have a nerve block on Monday.  Hopefully, this will help with the pain.  If so, then he would be a candidate for RFA to the spinal nerves.  He has the portal vein thrombus.  He is on heparin.  I will keep him on heparin until he has the block on Monday.  It is hard to say how much she really is eating.  I think would not be a bad idea to try him on some Marinol to see if this may help with his appetite a little bit.  I suspect that he probably does have metastasis to his liver.  He had MRI of the liver yesterday.  The results not yet back.  He has an element of depression.  I certainly understand this.  His activity level still is not all the great.  We did send off a prealbumin on him and we will see what that looks like.  Again, I realize that he does have metastatic cancer.  However, his problem has been this pain in the back which does not appear to be malignant in etiology.  If we can somehow improve this, then his quality of life will definitely improve.  His labs today show a glucose of 236.  His BUN is 9 creatinine 0.52.  Calcium is 8.5.  His white cell count is 10.  Hemoglobin 11.1.  Platelet count 556,000.  His vital signs are temperature of 97.7.  Pulse 76.  Blood pressure 137/103.  His head exam shows no ocular or oral lesions.  His lungs are relatively clear bilaterally.  He has good air movement.  Cardiac exam regular rate and rhythm.  Abdomen is soft.  Bowel sounds are somewhat decreased.  There is no guarding or rebound tenderness.  Extremity shows no clubbing, cyanosis or edema.  Neurological exam is nonfocal.  I realize that trying to get his pain medication at a adequate level is incredibly challenging.  Hopefully, with this nerve block, maybe he will improve.  Again, I will continue him on the heparin drip until he has this nerve block.  Maybe, Marinol will help a little bit.  Maybe this  might get him to eat a little bit better.  I know this is incredibly complex.  I appreciate so much the wonderful care that he is getting from the staff upon 5 E.  Kerby Nora, MD  Proverbs 12:25

## 2021-08-28 DIAGNOSIS — R531 Weakness: Secondary | ICD-10-CM

## 2021-08-28 DIAGNOSIS — R52 Pain, unspecified: Secondary | ICD-10-CM

## 2021-08-28 DIAGNOSIS — C7931 Secondary malignant neoplasm of brain: Secondary | ICD-10-CM | POA: Diagnosis not present

## 2021-08-28 DIAGNOSIS — K5900 Constipation, unspecified: Secondary | ICD-10-CM | POA: Diagnosis not present

## 2021-08-28 DIAGNOSIS — C787 Secondary malignant neoplasm of liver and intrahepatic bile duct: Secondary | ICD-10-CM | POA: Diagnosis not present

## 2021-08-28 DIAGNOSIS — C3492 Malignant neoplasm of unspecified part of left bronchus or lung: Secondary | ICD-10-CM | POA: Diagnosis not present

## 2021-08-28 DIAGNOSIS — C7951 Secondary malignant neoplasm of bone: Secondary | ICD-10-CM | POA: Diagnosis not present

## 2021-08-28 LAB — COMPREHENSIVE METABOLIC PANEL
ALT: 43 U/L (ref 0–44)
AST: 55 U/L — ABNORMAL HIGH (ref 15–41)
Albumin: 2.5 g/dL — ABNORMAL LOW (ref 3.5–5.0)
Alkaline Phosphatase: 904 U/L — ABNORMAL HIGH (ref 38–126)
Anion gap: 8 (ref 5–15)
BUN: 8 mg/dL (ref 6–20)
CO2: 21 mmol/L — ABNORMAL LOW (ref 22–32)
Calcium: 8.4 mg/dL — ABNORMAL LOW (ref 8.9–10.3)
Chloride: 108 mmol/L (ref 98–111)
Creatinine, Ser: 0.52 mg/dL — ABNORMAL LOW (ref 0.61–1.24)
GFR, Estimated: >60 mL/min (ref >60)
Glucose, Bld: 210 mg/dL — ABNORMAL HIGH (ref 70–99)
Potassium: 4.2 mmol/L (ref 3.5–5.1)
Sodium: 137 mmol/L (ref 135–145)
Total Bilirubin: 0.5 mg/dL (ref 0.3–1.2)
Total Protein: 5.9 g/dL — ABNORMAL LOW (ref 6.5–8.1)

## 2021-08-28 LAB — CULTURE, BLOOD (ROUTINE X 2): Special Requests: ADEQUATE

## 2021-08-28 LAB — CBC
HCT: 32.7 % — ABNORMAL LOW (ref 39.0–52.0)
Hemoglobin: 10.5 g/dL — ABNORMAL LOW (ref 13.0–17.0)
MCH: 30.3 pg (ref 26.0–34.0)
MCHC: 32.1 g/dL (ref 30.0–36.0)
MCV: 94.5 fL (ref 80.0–100.0)
Platelets: 446 10*3/uL — ABNORMAL HIGH (ref 150–400)
RBC: 3.46 MIL/uL — ABNORMAL LOW (ref 4.22–5.81)
RDW: 15.3 % (ref 11.5–15.5)
WBC: 13.8 10*3/uL — ABNORMAL HIGH (ref 4.0–10.5)
nRBC: 0 % (ref 0.0–0.2)

## 2021-08-28 LAB — HEPARIN LEVEL (UNFRACTIONATED): Heparin Unfractionated: 0.5 IU/mL (ref 0.30–0.70)

## 2021-08-28 LAB — GLUCOSE, CAPILLARY
Glucose-Capillary: 212 mg/dL — ABNORMAL HIGH (ref 70–99)
Glucose-Capillary: 114 mg/dL — ABNORMAL HIGH (ref 70–99)
Glucose-Capillary: 119 mg/dL — ABNORMAL HIGH (ref 70–99)
Glucose-Capillary: 131 mg/dL — ABNORMAL HIGH (ref 70–99)

## 2021-08-28 LAB — CEA: CEA: 25.5 ng/mL — ABNORMAL HIGH (ref 0.0–4.7)

## 2021-08-28 MED ORDER — HYDROMORPHONE 1 MG/ML IV SOLN
INTRAVENOUS | Status: DC
  Administered 2021-08-28: 1.5 mg via INTRAVENOUS
  Administered 2021-08-28: 0.6 mg via INTRAVENOUS
  Administered 2021-08-29: 0.9 mg via INTRAVENOUS
  Administered 2021-08-29: 0.6 mg via INTRAVENOUS
  Administered 2021-08-30: 30 mg via INTRAVENOUS
  Administered 2021-08-30: 0.6 mg via INTRAVENOUS
  Administered 2021-08-30: 0.9 mg via INTRAVENOUS
  Filled 2021-08-28: qty 30

## 2021-08-28 MED ORDER — HYDROMORPHONE 1 MG/ML IV SOLN
INTRAVENOUS | Status: DC
  Administered 2021-08-28: 3 mg via INTRAVENOUS
  Administered 2021-08-28: 4.5 mg via INTRAVENOUS

## 2021-08-28 MED ORDER — BISACODYL 10 MG RE SUPP
10.0000 mg | Freq: Once | RECTAL | Status: AC
  Administered 2021-08-28: 10 mg via RECTAL
  Filled 2021-08-28: qty 1

## 2021-08-28 NOTE — Progress Notes (Signed)
Daily Progress Note   Patient Name: Bill Garza       Date: 08/28/2021 DOB: 07-16-1960  Age: 61 y.o. MRN#: [id number] Attending Physician: Kayleen Memos, DO Primary Care Physician: Aleen Campi, NP Admit Date: 08/25/2021  Reason for Consultation/Follow-up: Pain control  Subjective: Awake alert oriented, sitting up at the side of the bed.  During the course of the conversation, does appear to doze off/become slightly more sleepy and then awakens and interacts appropriately.  States that his pain is reasonably well controlled today.  He notes that he is going to get a nerve block on Monday.  When he does get pain it is mostly concentrated in his back area.  Patient states that he is concerned about his wife, she probably has a bronchitis/sinus infection according to the patient.  He states that he lives in [place], [place].  I have also discussed with the nurse.  See PCA settings noted.  Medication history reviewed.    Length of Stay: 2  Current Medications: Scheduled Meds:   bisacodyl  10 mg Rectal Once   cyclobenzaprine  5 mg Oral TID   dexamethasone (DECADRON) injection  8 mg Intravenous Q24H   dronabinol  2.5 mg Oral TID AC   escitalopram  5 mg Oral Daily   feeding supplement  237 mL Oral BID BM   HYDROmorphone   Intravenous Q4H   insulin aspart  0-15 Units Subcutaneous TID WC   lactulose  13.3333-20 g Oral BID   methadone  10 mg Oral Q8H   metoCLOPramide (REGLAN) injection  5 mg Intravenous Q6H   mometasone-formoterol  2 puff Inhalation BID   montelukast  10 mg Oral QHS   multivitamin with minerals  1 tablet Oral Daily   pantoprazole  40 mg Oral Daily   pregabalin  75 mg Oral TID   senna-docusate  2 tablet Oral BID   traZODone  50 mg Oral QHS    Continuous  Infusions:  sodium chloride 50 mL/hr at 08/28/21 0750   heparin 1,500 Units/hr (08/28/21 0824)    PRN Meds: albuterol, diphenhydrAMINE **OR** diphenhydrAMINE, LORazepam, naloxone **AND** sodium chloride flush, ondansetron (ZOFRAN) IV, oxyCODONE, polyethylene glycol  Physical Exam         Awake alert oriented, becomes severe doses/less interactive for a few seconds and then wakes  right back up. No distress Regular work of breathing No abdominal distention S1-S2 No edema Appears with generalized weakness Vital Signs: BP 125/75 (BP Location: Right Arm)   Pulse 81   Temp 97.8 F (36.6 C) (Oral)   Resp 13   Ht 6' (1.829 m)   Wt 72.1 kg   SpO2 94%   BMI 21.56 kg/m  SpO2: SpO2: 94 % O2 Device: O2 Device: Nasal Cannula O2 Flow Rate: O2 Flow Rate (L/min): 2 L/min  Intake/output summary:  Intake/Output Summary (Last 24 hours) at 08/28/2021 1439 Last data filed at 08/28/2021 5956 Gross per 24 hour  Intake 1468.8 ml  Output 1700 ml  Net -231.2 ml   LBM: Last BM Date : 08/23/21 Baseline Weight: Weight: 72.1 kg Most recent weight: Weight: 72.1 kg       Palliative Assessment/Data:      Patient Active Problem List   Diagnosis Date Noted   Intractable pain 08/25/2021   SIRS (systemic inflammatory response syndrome) (HCC) 08/25/2021   Normocytic anemia 08/25/2021   Hyponatremia 08/25/2021   Anxiety 08/25/2021   Low back pain 07/30/2021   HTN (hypertension) 07/30/2021   HLD (hyperlipidemia) 07/30/2021   DM2 (diabetes mellitus, type 2) (HCC) 07/30/2021   Gastroparesis 07/30/2021   Chronic constipation 07/30/2021   Depression 07/30/2021   COPD (chronic obstructive pulmonary disease) ([place]) 07/30/2021   Tobacco abuse 07/30/2021   GERD (gastroesophageal reflux disease) 07/30/2021   Elevated alkaline phosphatase level 07/30/2021   Secondary malignant neoplasm of bone and bone marrow ([place]) 07/13/2021   Malignant neoplasm metastatic to brain ([place]) 03/09/2021   Non-small cell  lung cancer metastatic to brain ([place]) 01/27/2021   Goals of care, counseling/discussion 01/27/2021    Palliative Care Assessment & Plan   Patient Profile:    Assessment: 61 year old gentleman who lives at home with his wife in [place], [place], history of non-small cell lung cancer with mets to brain/liver/spine/hip, history of chronic pain tobacco use diabetes COPD anxiety/depression and GERD. Patient admitted to hospital medicine service for intractable back pain and abdominal pain with imaging showing new portal vein thrombosis, started on anticoagulation, oncology colleagues following, MR lumbar thoracic spine showing new and progressive thoracic and lumbosacral osseous metastatic disease burden, enlarging bilateral iliac bone and upper abdominal nodal metastatic disease.  Also has multilevel lumbar spondylosis. Palliative medicine team following for pain management.  Recommendations/Plan: Overnight events noted, chart reviewed.  Patient noted to have a had a respiratory depression, requiring administration of naloxone and slight de-escalation of his PCA settings.  PCA pump noted, medication history noted.  Patient also on p.o. methadone.  Opioid history noted.  Discussed with the patient in detail.  Call placed and discussed with the wife in detail as well.  Our primary concern is due for the patient to be as comfortable as possible while also balancing awakeness/alertness and his participation with family interactions and being as up and about as is reasonably possible.  Discussed extensively with patient, wife as well as bedside RN about dual goal of keeping patient comfortable as well as avoiding sedation and respiratory depression.  Patient's wife remains hopeful that his pain will be controlled, the nerve block will work, and that he will qualify for some type of chemotherapy offered active listening and supportive empathic presence as much as is possible over a phone visit.  I have  adjusted the PCA.  Continue p.o. methadone.  Palliative medicine team to continue to follow along.  Add Dulcolax suppository to the current bowel  regimen.  Monitor oral intake.  Goals of Care and Additional Recommendations: Limitations on Scope of Treatment: Full Scope Treatment  Code Status:    Code Status Orders  (From admission, onward)           Start     Ordered   08/25/21 1527  Full code  Continuous        08/25/21 1526           Code Status History     This patient has a current code status but no historical code status.       Prognosis:  Unable to determine  Discharge Planning: To Be Determined  Care plan was discussed with  patient, RN, call placed and updated wife as well.   Thank you for allowing the Palliative Medicine Team to assist in the care of this patient.   Time In: 1400 Time Out: 1435 Total Time 35 Prolonged Time Billed  no       Greater than 50%  of this time was spent counseling and coordinating care related to the above assessment and plan.  Loistine Chance, MD  Please contact Palliative Medicine Team phone at [phone] for questions and concerns.

## 2021-08-28 NOTE — Progress Notes (Signed)
[place] for heparin Indication:  VTE treatment  (portal vein thrombosis)  Allergies  Allergen Reactions   Gabapentin Swelling    Leg swelling    Levetiracetam Other (See Comments)    Personality changes   Pregabalin Swelling    Leg swelling    Bupropion Nausea And Vomiting   Duloxetine Other (See Comments)    Excessive sleeping   Ezetimibe Other (See Comments)    Unknown reaction   Other Other (See Comments)    Antibiotics - per wife at bedside, patient will develop C-diff if given "any antibiotic"   Prochlorperazine Nausea And Vomiting   Lamotrigine Itching and Rash    Patient Measurements: Height: 6' (182.9 cm) Weight: 72.1 kg (159 lb) IBW/kg (Calculated) : 77.6 Heparin Dosing Weight: 72.1 kg  Vital Signs: Temp: 97.6 F (36.4 C) (06/10 0445) Temp Source: Oral (06/10 0445) BP: 145/89 (06/10 0445) Pulse Rate: 78 (06/10 0445)  Labs: Recent Labs    08/25/21 0816 08/25/21 1730 08/26/21 0532 08/26/21 5329 08/26/21 2029 08/27/21 0231 08/28/21 0601  HGB  --   --  10.3*  --   --  11.1* 10.5*  HCT  --   --  32.1*  --   --  34.2* 32.7*  PLT  --   --  523*  --   --  556* 446*  APTT 38*  --   --   --   --   --   --   LABPROT 18.9*  --   --   --   --   --   --   INR 1.6*  --   --   --   --   --   --   HEPARINUNFRC  --    < >  --    < > 0.57 0.54 0.50  CREATININE  --   --  0.49*  --   --  0.52* 0.52*   < > = values in this interval not displayed.     Estimated Creatinine Clearance: 102.6 mL/min (A) (by C-G formula based on SCr of 0.52 mg/dL (L)).   Assessment: 61 year old male presented with abdominal pain. Patient with history of NSCLC with mets to brain/liver/spine/hip. 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 Recent craniotomy in April 2023 at [facility]; [facility] spoke with neurosurgery team there who state no contraindication for anticoagulation from their standpoint. Pharmacy consulted to start heparin.  No  anticoagulation noted PTA.   08/28/2021: Heparin level remains stable and therapeutic on 1500 units/hr CBC: Hgb low but relatively stable; Plt elevated but trending down No bleeding or infusion issues noted per RN  Goal of Therapy:  Heparin level 0.3-0.7 units/ml Monitor platelets by anticoagulation protocol: Yes   Plan:  Continue heparin infusion at 1500 units/hr Daily CBC, heparin level  Monitor closely for s/sx of bleeding F/u for transition to long-term therapy  Reuel Boom, PharmD, BCPS [phone] 08/28/2021, 7:25 AM

## 2021-08-28 NOTE — Progress Notes (Addendum)
PROGRESS NOTE  Bill Garza MWN:[id number] DOB: [birth year] DOA: 08/25/2021 PCP: Aleen Campi, NP  HPI/Recap of past 24 hours: 61 y.o. 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 Presenting with intractable back pain and abdominal pain-  imaging w/ new portal vein thrombosis-started on anticoagulation after discussing with [name], palliative care and hematology consulted On admission: MR lumbar thoracic spine shows-new and progressive thoracic and lumbosacral osseous metastatic disease without significant extraosseous extension or pathologic fracture, enlarging bilateral iliac bone and upper abdominal nodal metastatic disease, unchanged multilevel lumbar spondylosis.  Seen by palliative for pain management, appreciate assistance.    Due to intractable pain not improved with as needed IV opioids, the patient was started on PCA Dilaudid pump.  08/28/21: Seen and examined at bedside.  Reports his pain is 7 out of 10 despite PCA Dilaudid pump, methadone, Flexeril, IV Decadron and Lyrica.  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 has consulted IR to consider RFA to L4 or L5 nerve root.   Palliative care also  consult IR to look for iliac bone freezing.  Liver MRI shows innumerable more than 30 liver masses scattered throughout new since 3/31, several enhancing osseous lesions throughout the visualized skeleton increased since 3/31, mild to moderate intrahepatic biliary duct dilatation, no choledocholithiasis, small gallbladder sludge Reports his pain is 7 out of 10 despite PCA Dilaudid pump, methadone, Flexeril, IV Decadron and Lyrica.   Portal vein thrombosis:  Continue heparin drip and switch to DOAC closer to discharge.  On admission neurosurgery team Dr. Salomon Fick was consulted about anticoagulation and there is no contraindication to [facility] at this point w/ respect to his brain.  Elevated liver chemistries Continue to closely monitor Avoid hepatotoxic agents as possible  Non-anion gap metabolic acidosis Serum bicarb 21 and anion gap of 8 Encourage oral intake Repeat chemistry panel   DM2:  A1c 7.1 blood sugar poorly controlled, likely in the setting of patient's Decadron increase sliding scale increase to 0-15 unit Continue to hold metformin   Anxiety/depression: Appears anxious, resume home meds-Lexapro and Ativan.   COPD Tobacco abuse: Not in exacerbation continue home inhalers-Dulera, Singulair   Elevated alkaline phosphatase level:  Likely from bone metastasis    SIRS On admission mild tachycardia, leukocytosis 13.3.  No obvious infection source noted patient had CT chest abdomen pelvis leukocytosis has resolved likely reactive.  UA unremarkable with negative nitrate and leukocytes.  Holding off on antibiotics.   Afebrile, continue to monitor   Normocytic anemia hb stable.Monitor No overt bleeding.   Resolved hyponatremia:     Critical care time: 65 minutes.        Goals of care: Oncology has discussed extensively patient has an incurable malignancy continues to desire full code.  DNR has been recommended,  palliative care on board. Cont pain control as #1   DVT  prophylaxis: SCDs Start: 08/25/21 1527, heparin drip. Code Status:   Code Status: Full Code Family Communication: plan of care discussed with patient/family at bedside. Patient status is: Inpatient because of ongoing need for IV pain management with intractable pain Level of care: Telemetry    Dispo: The patient is from: Home            Anticipated disposition: Pending pain control.        Status is: Inpatient The patient requires at least 2 midnights for further evaluation and treatment of present condition.    Objective: Vitals:   08/28/21 0750 08/28/21 0820 08/28/21 1235 08/28/21 1239  BP:  126/88 125/75   Pulse:  84 81   Resp: 14  15 13   Temp:  97.7 F (36.5 C) 97.8 F (36.6 C)   TempSrc:  Oral Oral   SpO2: 94% 98% 96% 94%  Weight:      Height:        Intake/Output Summary (Last 24 hours) at 08/28/2021 1503 Last data filed at 08/28/2021 7035 Gross per 24 hour  Intake 1468.8 ml  Output 1700 ml  Net -231.2 ml   Filed Weights   08/25/21 0809  Weight: 72.1 kg    Exam:  General: 61 y.o. year-old male well developed well nourished in no acute distress.  Alert and oriented x3. Cardiovascular: Regular rate and rhythm with no rubs or gallops.  No thyromegaly or JVD noted.   Respiratory: Clear to auscultation with no wheezes or rales. Good inspiratory effort. Abdomen: Soft nontender nondistended with normal bowel sounds x4 quadrants. Musculoskeletal: No lower extremity edema. 2/4 pulses in all 4 extremities. Skin: No ulcerative lesions noted or rashes, Psychiatry: Mood is appropriate for condition and setting   Data Reviewed: CBC: Recent Labs  Lab 08/23/21 1320 08/25/21 0812 08/26/21 0532 08/27/21 0231 08/28/21 0601  WBC 13.3* 12.8* 9.9 10.0 13.8*  NEUTROABS 10.0* 10.4*  --   --   --   HGB 13.2 12.0* 10.3* 11.1* 10.5*  HCT 39.0 36.3* 32.1* 34.2* 32.7*  MCV 90.3 91.2 95.5 94.2 94.5  PLT 670* 591* 523* 556* 009*   Basic Metabolic Panel: Recent Labs   Lab 08/23/21 1403 08/25/21 0812 08/26/21 0532 08/27/21 0231 08/28/21 0601  NA 130* 132* 136 134* 137  K 3.5 3.5 3.3* 4.4 4.2  CL 98 102 107 106 108  CO2 20* 20* 19* 21* 21*  GLUCOSE 143* 153* 127* 236* 210*  BUN 12 11 7 9 8   CREATININE 0.55* 0.49* 0.49* 0.52* 0.52*  CALCIUM 8.5* 8.4* 6.9* 8.1* 8.4*   GFR: Estimated Creatinine Clearance: 102.6 mL/min (A) (by C-G formula based on SCr of 0.52 mg/dL (L)). Liver Function Tests: Recent Labs  Lab 08/23/21 1403 08/25/21 0812 08/26/21 0532 08/28/21 0601  AST 20 24 25  55*  ALT 27 25 22  43  ALKPHOS 742* 710* 644* 904*  BILITOT 0.5 0.9 0.6 0.5  PROT 6.2* 6.4* 5.3* 5.9*  ALBUMIN 3.3* 2.7* 2.2* 2.5*   Recent Labs  Lab 08/25/21 0812  LIPASE 19   No results for input(s): "AMMONIA" in the last 168 hours. Coagulation Profile: Recent Labs  Lab 08/25/21 0816  INR 1.6*   Cardiac Enzymes: No results for input(s): "CKTOTAL", "CKMB", "CKMBINDEX", "TROPONINI" in the last 168 hours. BNP (last 3 results) No results for input(s): "PROBNP" in the last 8760 hours. HbA1C: Recent Labs    08/25/21 1730  HGBA1C 7.1*  CBG: Recent Labs  Lab 08/27/21 1124 08/27/21 1635 08/27/21 2206 08/28/21 0751 08/28/21 1232  GLUCAP 183* 120* 141* 212* 114*   Lipid Profile: No results for input(s): "CHOL", "HDL", "LDLCALC", "TRIG", "CHOLHDL", "LDLDIRECT" in the last 72 hours. Thyroid Function Tests: No results for input(s): "TSH", "T4TOTAL", "FREET4", "T3FREE", "THYROIDAB" in the last 72 hours. Anemia Panel: Recent Labs    08/25/21 1939  TIBC 207*  IRON 31*   Urine analysis:    Component Value Date/Time   COLORURINE YELLOW 08/25/2021 [place] 08/25/2021 0749   LABSPEC 1.006 08/25/2021 0749   PHURINE 7.0 08/25/2021 0749   GLUCOSEU NEGATIVE 08/25/2021 0749   HGBUR NEGATIVE 08/25/2021 0749   BILIRUBINUR NEGATIVE 08/25/2021 0749   KETONESUR 20 (A) 08/25/2021 0749   PROTEINUR NEGATIVE 08/25/2021 0749   NITRITE  NEGATIVE 08/25/2021 0749   LEUKOCYTESUR NEGATIVE 08/25/2021 0749   Sepsis Labs: @LABRCNTIP (procalcitonin:4,lacticidven:4)  ) Recent Results (from the past 240 hour(s))  Culture, blood (Routine X 2) w Reflex to ID Panel     Status: None (Preliminary result)   Collection Time: 08/25/21  3:01 PM   Specimen: Right Antecubital; Blood  Result Value Ref Range Status   Specimen Description   Final    RIGHT ANTECUBITAL BLOOD Performed at [facility], [address] [place], [place] [address]    Special Requests   Final    Blood Culture adequate volume BOTTLES DRAWN AEROBIC AND ANAEROBIC Performed at [facility], [address] [place], [place] [address]    Culture   Final    NO GROWTH 3 DAYS Performed at [facility], [place] [address], [place], [place] [address]    Report Status PENDING  Incomplete  Culture, blood (Routine X 2) w Reflex to ID Panel     Status: Abnormal   Collection Time: 08/25/21  5:30 PM   Specimen: BLOOD  Result Value Ref Range Status   Specimen Description BLOOD SITE NOT SPECIFIED  Final   Special Requests IN PEDIATRIC BOTTLE Blood Culture adequate volume  Final   Culture  Setup Time   Final    GRAM POSITIVE COCCI IN CLUSTERS IN PEDIATRIC BOTTLE CRITICAL RESULT CALLED TO, READ BACK BY AND VERIFIED WITH: L POINDEXTER,[email or web address]  08/26/21 [place]    Culture (A)  Final    STAPHYLOCOCCUS EPIDERMIDIS THE SIGNIFICANCE OF ISOLATING THIS ORGANISM FROM A SINGLE VENIPUNCTURE CANNOT BE PREDICTED WITHOUT FURTHER CLINICAL AND CULTURE CORRELATION. SUSCEPTIBILITIES AVAILABLE ONLY ON REQUEST. Performed at [facility], [place] [address], [place], [place] [address]    Report Status 08/28/2021 FINAL  Final  Blood Culture ID Panel (Reflexed)     Status: Abnormal   Collection Time: 08/25/21  5:30 PM  Result Value Ref Range Status   Enterococcus faecalis NOT DETECTED NOT DETECTED Final   Enterococcus Faecium NOT DETECTED NOT DETECTED Final   Listeria  monocytogenes NOT DETECTED NOT DETECTED Final   Staphylococcus species DETECTED (A) NOT DETECTED Final    Comment: CRITICAL RESULT CALLED TO, READ BACK BY AND VERIFIED WITH: L POINDEXTER,[email or web address]  08/26/21 [place]    Staphylococcus aureus (BCID) NOT DETECTED NOT DETECTED Final   Staphylococcus epidermidis DETECTED (A) NOT DETECTED Final    Comment: CRITICAL RESULT CALLED TO, READ BACK BY AND VERIFIED WITH: L POINDEXTER,[email or web address]  08/26/21 [place]    Staphylococcus lugdunensis NOT DETECTED NOT DETECTED Final   Streptococcus species NOT DETECTED NOT DETECTED Final   Streptococcus agalactiae NOT DETECTED NOT DETECTED Final   Streptococcus  pneumoniae NOT DETECTED NOT DETECTED Final   Streptococcus pyogenes NOT DETECTED NOT DETECTED Final   A.calcoaceticus-baumannii NOT DETECTED NOT DETECTED Final   Bacteroides fragilis NOT DETECTED NOT DETECTED Final   Enterobacterales NOT DETECTED NOT DETECTED Final   Enterobacter cloacae complex NOT DETECTED NOT DETECTED Final   Escherichia coli NOT DETECTED NOT DETECTED Final   Klebsiella aerogenes NOT DETECTED NOT DETECTED Final   Klebsiella oxytoca NOT DETECTED NOT DETECTED Final   Klebsiella pneumoniae NOT DETECTED NOT DETECTED Final   Proteus species NOT DETECTED NOT DETECTED Final   Salmonella species NOT DETECTED NOT DETECTED Final   Serratia marcescens NOT DETECTED NOT DETECTED Final   Haemophilus influenzae NOT DETECTED NOT DETECTED Final   Neisseria meningitidis NOT DETECTED NOT DETECTED Final   Pseudomonas aeruginosa NOT DETECTED NOT DETECTED Final   Stenotrophomonas maltophilia NOT DETECTED NOT DETECTED Final   Candida albicans NOT DETECTED NOT DETECTED Final   Candida auris NOT DETECTED NOT DETECTED Final   Candida glabrata NOT DETECTED NOT DETECTED Final   Candida krusei NOT DETECTED NOT DETECTED Final   Candida parapsilosis NOT DETECTED NOT DETECTED Final   Candida tropicalis NOT DETECTED NOT DETECTED Final   Cryptococcus neoformans/gattii  NOT DETECTED NOT DETECTED Final   Methicillin resistance mecA/C NOT DETECTED NOT DETECTED Final    Comment: Performed at [facility], [address] [place], [place] [address]      Studies: No results found.  Scheduled Meds:  bisacodyl  10 mg Rectal Once   cyclobenzaprine  5 mg Oral TID   dexamethasone (DECADRON) injection  8 mg Intravenous Q24H   dronabinol  2.5 mg Oral TID AC   escitalopram  5 mg Oral Daily   feeding supplement  237 mL Oral BID BM   HYDROmorphone   Intravenous Q4H   insulin aspart  0-15 Units Subcutaneous TID WC   lactulose  13.3333-20 g Oral BID   methadone  10 mg Oral Q8H   metoCLOPramide (REGLAN) injection  5 mg Intravenous Q6H   mometasone-formoterol  2 puff Inhalation BID   montelukast  10 mg Oral QHS   multivitamin with minerals  1 tablet Oral Daily   pantoprazole  40 mg Oral Daily   pregabalin  75 mg Oral TID   senna-docusate  2 tablet Oral BID   traZODone  50 mg Oral QHS    Continuous Infusions:  sodium chloride 50 mL/hr at 08/28/21 0750   heparin 1,500 Units/hr (08/28/21 0824)     LOS: 2 days     Kayleen Memos, MD [facility] Pager [phone]  If 7PM-7AM, please contact night-coverage [email or web address] Password [facility] 08/28/2021, 3:03 PM

## 2021-08-28 NOTE — Progress Notes (Addendum)
Pt wife called. Wife stated that she would like Lyrica discontinued due to edema in BLE . MD [place] Crane notified. MD Ennever notified.

## 2021-08-28 NOTE — Progress Notes (Signed)
Bill Garza certainly looks better this morning.  He is now on the PCA.  I do appreciate everybody's help with trying to improve his pain control.  He is going to have the nerve block done on Monday.  Unfortunately, the MRI of the liver does show he has metastatic disease to the liver.  This was not there 2 months ago.  Clearly, I think this indicates that his cancer is somewhat aggressive.  Surprising, as we albumin little bit better than I would have thought.  His prealbumin yesterday was 11.2.  As such, I still think that with there may be an opportunity to try to help his malignancy.  Unfortunately, this will require chemotherapy.  I am still not absolutely convinced that he is going be able to manage chemotherapy.  He is eating pretty well.  He sitting up in the bed this morning.  He is quite alert.  It was nice being able to talk to him and he is not miserable.  Hopefully, the PCA is helping him.  He is on Lyrica.  He is having no problems with the Lyrica.  Maybe, this is also helping with his mood.  We started him on Marinol.  I really do hope that the nerve block will help with the pain.  I know that Interventional Radiology are very good at doing this and giving all our patients some pain relief.  His vital signs are temperature 97.6.  Pulse 78.  Blood pressure 145/89.  His lungs are clear.  He has good air movement bilaterally.  Cardiac exam regular rate and rhythm.  Abdomen is soft.  Bowel sounds might be a little bit decreased.  Neurological exam shows no focal neurological deficits.  His extremities shows decent strength.  Again, our focus is quality of life for Bill Garza.  Hopefully the nerve block will help.  We will see how he does on the PCA.  I am just glad that he is able to have this.  I think this was a fantastic idea.  I will still have to determine whether or not we can be able to treat the underlying malignancy.  I know that he and his wife would like to try treatment.   Again, I need to make sure that his performance status is adequate enough.  Again I know he is getting incredible care from everybody up on 5 E.   Lattie Haw, MD  [place] 6:8

## 2021-08-29 ENCOUNTER — Encounter (HOSPITAL_COMMUNITY): Payer: Self-pay | Admitting: Internal Medicine

## 2021-08-29 ENCOUNTER — Encounter: Payer: Self-pay | Admitting: Hematology & Oncology

## 2021-08-29 ENCOUNTER — Inpatient Hospital Stay (HOSPITAL_COMMUNITY): Payer: Medicare HMO

## 2021-08-29 DIAGNOSIS — R52 Pain, unspecified: Secondary | ICD-10-CM | POA: Diagnosis not present

## 2021-08-29 LAB — HEPARIN LEVEL (UNFRACTIONATED)
Heparin Unfractionated: 0.22 IU/mL — ABNORMAL LOW (ref 0.30–0.70)
Heparin Unfractionated: 0.34 IU/mL (ref 0.30–0.70)

## 2021-08-29 LAB — GLUCOSE, CAPILLARY
Glucose-Capillary: 167 mg/dL — ABNORMAL HIGH (ref 70–99)
Glucose-Capillary: 110 mg/dL — ABNORMAL HIGH (ref 70–99)
Glucose-Capillary: 96 mg/dL (ref 70–99)
Glucose-Capillary: 118 mg/dL — ABNORMAL HIGH (ref 70–99)

## 2021-08-29 LAB — CBC
HCT: 32.7 % — ABNORMAL LOW (ref 39.0–52.0)
Hemoglobin: 10.4 g/dL — ABNORMAL LOW (ref 13.0–17.0)
MCH: 30.3 pg (ref 26.0–34.0)
MCHC: 31.8 g/dL (ref 30.0–36.0)
MCV: 95.3 fL (ref 80.0–100.0)
Platelets: 468 10*3/uL — ABNORMAL HIGH (ref 150–400)
RBC: 3.43 MIL/uL — ABNORMAL LOW (ref 4.22–5.81)
RDW: 15.6 % — ABNORMAL HIGH (ref 11.5–15.5)
WBC: 12.2 10*3/uL — ABNORMAL HIGH (ref 4.0–10.5)
nRBC: 0 % (ref 0.0–0.2)

## 2021-08-29 LAB — COMPREHENSIVE METABOLIC PANEL
ALT: 52 U/L — ABNORMAL HIGH (ref 0–44)
AST: 63 U/L — ABNORMAL HIGH (ref 15–41)
Albumin: 2.6 g/dL — ABNORMAL LOW (ref 3.5–5.0)
Alkaline Phosphatase: 858 U/L — ABNORMAL HIGH (ref 38–126)
Anion gap: 8 (ref 5–15)
BUN: 7 mg/dL (ref 6–20)
CO2: 23 mmol/L (ref 22–32)
Calcium: 8.6 mg/dL — ABNORMAL LOW (ref 8.9–10.3)
Chloride: 107 mmol/L (ref 98–111)
Creatinine, Ser: 0.51 mg/dL — ABNORMAL LOW (ref 0.61–1.24)
GFR, Estimated: >60 mL/min (ref >60)
Glucose, Bld: 190 mg/dL — ABNORMAL HIGH (ref 70–99)
Potassium: 4.4 mmol/L (ref 3.5–5.1)
Sodium: 138 mmol/L (ref 135–145)
Total Bilirubin: 0.4 mg/dL (ref 0.3–1.2)
Total Protein: 6 g/dL — ABNORMAL LOW (ref 6.5–8.1)

## 2021-08-29 IMAGING — DX DG CHEST 1V PORT
2 series · 2 of 2 positions shown · non-contrast
Comparison: [DATE]

CLINICAL DATA: Hypoxia.

EXAM:
PORTABLE CHEST 1 VIEW

[chest ap (1 of 2)]
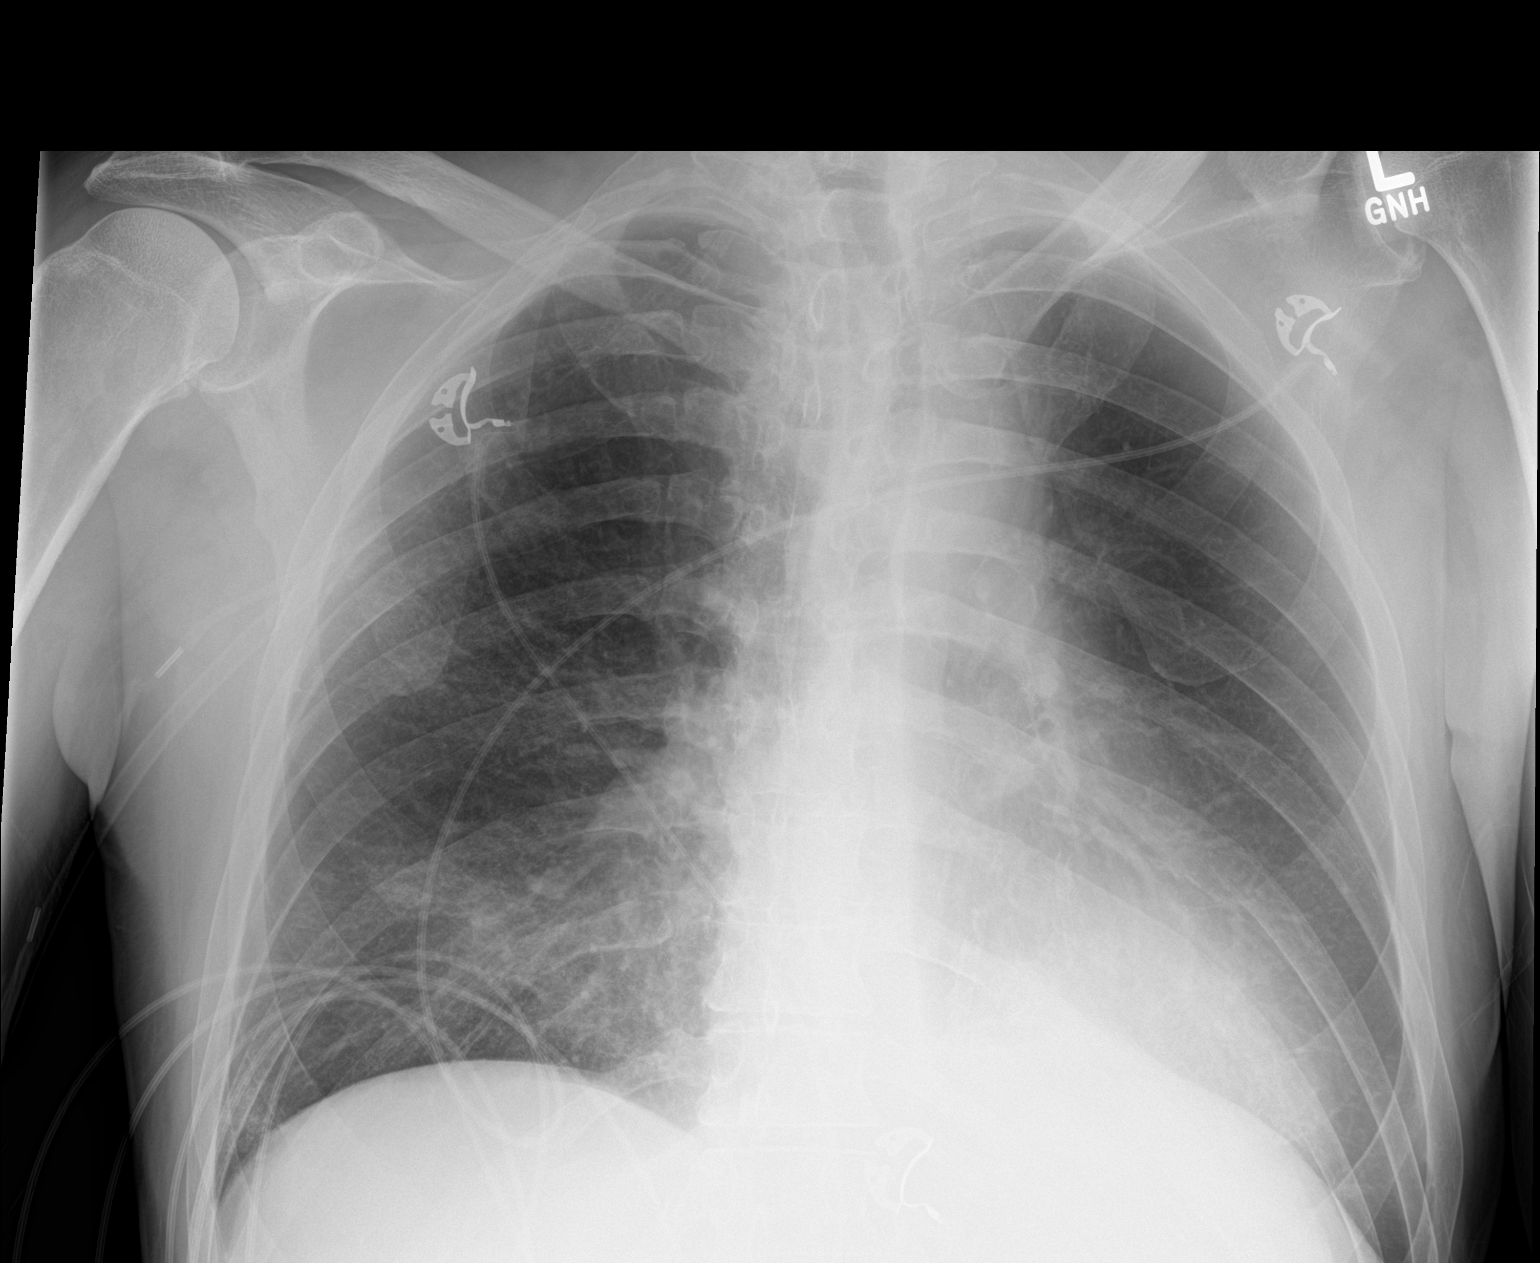

[chest ap (2 of 2)]
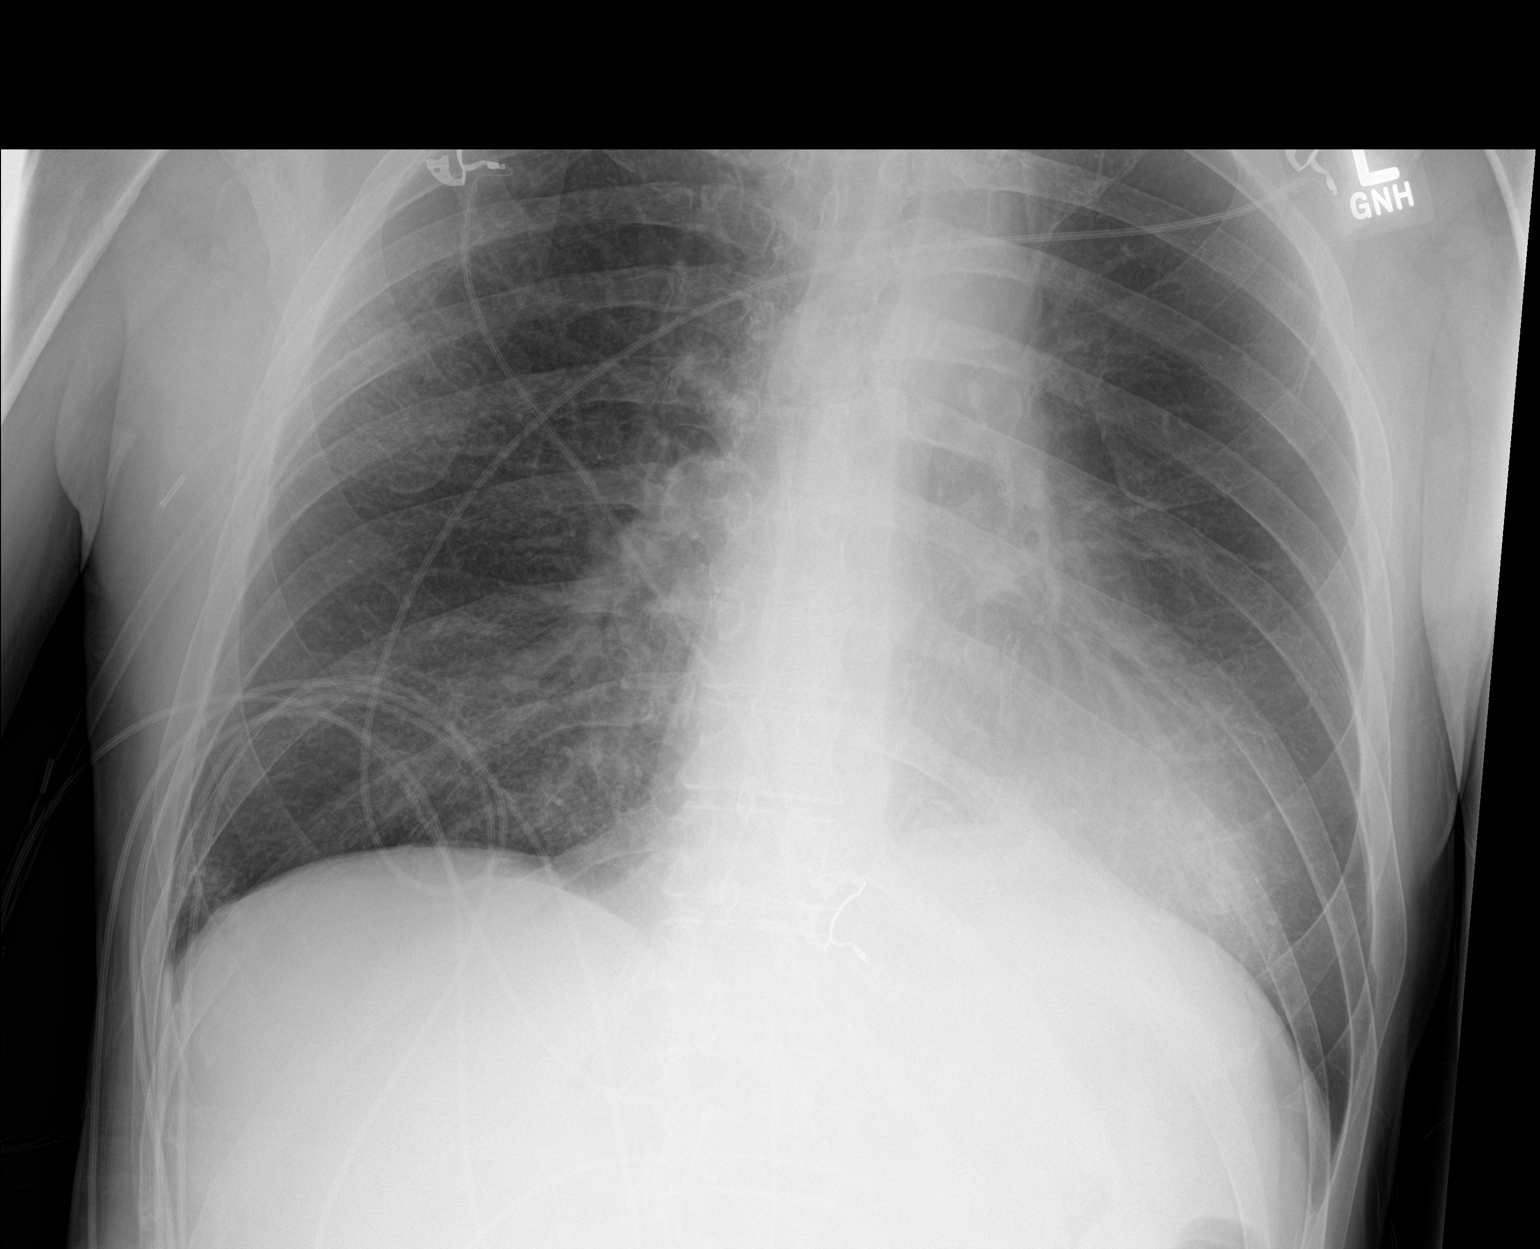

[2 of 2 positions shown; findings below may reference images not displayed]

FINDINGS: The heart size and mediastinal contours are within normal limits.
Mild areas of atelectasis and/or infiltrate are seen within the
bilateral lung bases. There is no evidence of a pleural effusion or
pneumothorax. The visualized skeletal structures are unremarkable.
IMPRESSION: Mild bibasilar atelectasis and/or infiltrate.

## 2021-08-29 MED ORDER — FUROSEMIDE 10 MG/ML IJ SOLN
20.0000 mg | Freq: Once | INTRAMUSCULAR | Status: AC
Start: 2021-08-29 — End: 2021-08-29
  Administered 2021-08-29: 20 mg via INTRAVENOUS
  Filled 2021-08-29: qty 2

## 2021-08-29 NOTE — Progress Notes (Addendum)
Daily Progress Note   Patient Name: Bill Garza       Date: 08/29/2021 DOB: 05-21-1960  Age: 61 y.o. MRN#: [id number] Attending Physician: Kayleen Memos, DO Primary Care Physician: Aleen Campi, NP Admit Date: 08/25/2021  Reason for Consultation/Follow-up: Pain control  Subjective: Awake alert oriented, sitting up at the side of the bed. Pain better controlled today. Complains of being "sore" everywhere today.     Length of Stay: 3  Current Medications: Scheduled Meds:   cyclobenzaprine  5 mg Oral TID   dexamethasone (DECADRON) injection  8 mg Intravenous Q24H   dronabinol  2.5 mg Oral TID AC   escitalopram  5 mg Oral Daily   feeding supplement  237 mL Oral BID BM   HYDROmorphone   Intravenous Q4H   insulin aspart  0-15 Units Subcutaneous TID WC   lactulose  13.3333-20 g Oral BID   methadone  10 mg Oral Q8H   metoCLOPramide (REGLAN) injection  5 mg Intravenous Q6H   mometasone-formoterol  2 puff Inhalation BID   montelukast  10 mg Oral QHS   multivitamin with minerals  1 tablet Oral Daily   pantoprazole  40 mg Oral Daily   senna-docusate  2 tablet Oral BID   traZODone  50 mg Oral QHS    Continuous Infusions:  heparin 1,600 Units/hr (08/29/21 1131)    PRN Meds: albuterol, diphenhydrAMINE **OR** diphenhydrAMINE, LORazepam, naloxone **AND** sodium chloride flush, ondansetron (ZOFRAN) IV, oxyCODONE, polyethylene glycol  Physical Exam         Awake alert oriented,   No distress Regular work of breathing No abdominal distention S1-S2  Appears with generalized weakness Vital Signs: BP 121/67 (BP Location: Left Arm)   Pulse 93   Temp 97.7 F (36.5 C) (Oral)   Resp 20   Ht 6' (1.829 m)   Wt 72.1 kg   SpO2 92%   BMI 21.56 kg/m  SpO2: SpO2: 92 % O2 Device: O2  Device: Nasal Cannula O2 Flow Rate: O2 Flow Rate (L/min): 2 L/min  Intake/output summary:  Intake/Output Summary (Last 24 hours) at 08/29/2021 1347 Last data filed at 08/29/2021 0832 Gross per 24 hour  Intake 701.09 ml  Output 500 ml  Net 201.09 ml    LBM: Last BM Date : 08/28/21 Baseline Weight: Weight: 72.1 kg Most recent  weight: Weight: 72.1 kg       Palliative Assessment/Data:      Patient Active Problem List   Diagnosis Date Noted   Generalized pain    General weakness    Constipation    Intractable pain 08/25/2021   SIRS (systemic inflammatory response syndrome) (HCC) 08/25/2021   Normocytic anemia 08/25/2021   Hyponatremia 08/25/2021   Anxiety 08/25/2021   Low back pain 07/30/2021   HTN (hypertension) 07/30/2021   HLD (hyperlipidemia) 07/30/2021   DM2 (diabetes mellitus, type 2) ([place]) 07/30/2021   Gastroparesis 07/30/2021   Chronic constipation 07/30/2021   Depression 07/30/2021   COPD (chronic obstructive pulmonary disease) ([place]) 07/30/2021   Tobacco abuse 07/30/2021   GERD (gastroesophageal reflux disease) 07/30/2021   Elevated alkaline phosphatase level 07/30/2021   Secondary malignant neoplasm of bone and bone marrow ([place]) 07/13/2021   Malignant neoplasm metastatic to brain ([place]) 03/09/2021   Non-small cell lung cancer metastatic to brain ([place]) 01/27/2021   Goals of care, counseling/discussion 01/27/2021    Palliative Care Assessment & Plan   Patient Profile:    Assessment: 61 year old gentleman who lives at home with his wife in [place], [place], history of non-small cell lung cancer with mets to brain/liver/spine/hip, history of chronic pain tobacco use diabetes COPD anxiety/depression and GERD. Patient admitted to hospital medicine service for intractable back pain and abdominal pain with imaging showing new portal vein thrombosis, started on anticoagulation, oncology colleagues following, MR lumbar thoracic spine showing new and  progressive thoracic and lumbosacral osseous metastatic disease burden, enlarging bilateral iliac bone and upper abdominal nodal metastatic disease.  Also has multilevel lumbar spondylosis. Palliative medicine team following for pain management.  Recommendations/Plan:  Continue current pain and non pain symptom management. Awaiting nerve block tomorrow.   Goals of Care and Additional Recommendations: Limitations on Scope of Treatment: Full Scope Treatment  Code Status:    Code Status Orders  (From admission, onward)           Start     Ordered   08/25/21 1527  Full code  Continuous        08/25/21 1526           Code Status History     This patient has a current code status but no historical code status.       Prognosis:  Unable to determine  Discharge Planning: To Be Determined  Care plan was discussed with  patient, RN.   Thank you for allowing the Palliative Medicine Team to assist in the care of this patient.   Time In: 1300 Time Out: 1335 Total Time 35  Prolonged Time Billed  no       Greater than 50%  of this time was spent counseling and coordinating care related to the above assessment and plan.  Loistine Chance, MD  Please contact Palliative Medicine Team phone at [phone] for questions and concerns.

## 2021-08-29 NOTE — Progress Notes (Signed)
[place] for heparin Indication:  VTE treatment  (portal vein thrombosis)  Allergies  Allergen Reactions   Gabapentin Swelling    Leg swelling    Levetiracetam Other (See Comments)    Personality changes   Pregabalin Swelling    Leg swelling    Bupropion Nausea And Vomiting   Duloxetine Other (See Comments)    Excessive sleeping   Ezetimibe Other (See Comments)    Unknown reaction   Other Other (See Comments)    Antibiotics - per wife at bedside, patient will develop C-diff if given "any antibiotic"   Prochlorperazine Nausea And Vomiting   Lamotrigine Itching and Rash    Patient Measurements: Height: 6' (182.9 cm) Weight: 72.1 kg (159 lb) IBW/kg (Calculated) : 77.6 Heparin Dosing Weight: 72.1 kg  Vital Signs: Temp: 97.7 F (36.5 C) (06/11 0338) Temp Source: Oral (06/11 0338) BP: 153/90 (06/11 0338) Pulse Rate: 86 (06/11 0338)  Labs: Recent Labs    08/27/21 0231 08/28/21 0601 08/29/21 0616 08/29/21 1036  HGB 11.1* 10.5* 10.4*  --   HCT 34.2* 32.7* 32.7*  --   PLT 556* 446* 468*  --   HEPARINUNFRC 0.54 0.50 0.22* 0.34  CREATININE 0.52* 0.52* 0.51*  --     Estimated Creatinine Clearance: 102.6 mL/min (A) (by C-G formula based on SCr of 0.51 mg/dL (L)).   Assessment: 61 year old male presented with abdominal pain. Patient with history of NSCLC with mets to brain/liver/spine/hip. 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 Recent crani[facility] in April 2023 at [facility]; [facility] spoke with neurosurgery team there who state no contraindication for anticoagulation from their standpoint. Pharmacy consulted to start heparin.  No anticoagulation noted PTA.   08/29/2021: Heparin level initially came back low on 1500 units/hr; seemed unusual given previously stable; redraw came back lower than yesterday but still therapeutic CBC: Hgb low but relatively stable; Plt elevated/stable No bleeding or infusion issues noted per  RN  Goal of Therapy:  Heparin level 0.3-0.7 units/ml Monitor platelets by anticoagulation protocol: Yes   Plan:  Increase heparin infusion slightly to 1600 units/hr Daily CBC, heparin level  Monitor closely for s/sx of bleeding F/u for transition to long-term therapy  Reuel Boom, PharmD, BCPS [phone] 08/29/2021, 11:28 AM

## 2021-08-29 NOTE — Progress Notes (Signed)
Held pt's methadone, flexeril, Lyrica, and trazodone due to excessive drowsiness this shift. Pt now seems to be more alert when entering room and resting comfortably at this time.  Also, pt's wife states that husband usually takes a diuretic when his feet swell. She would like MD to order if possible.

## 2021-08-29 NOTE — Progress Notes (Addendum)
Wife is refusing for patient to be given Lyrica. MD notified.

## 2021-08-29 NOTE — Plan of Care (Signed)
Pt rested well throughout shift. Complains of pain in legs with movement. He feels this may be due to swelling. Problem: Education: Goal: Knowledge of General Education information will improve Description: Including pain rating scale, medication(s)/side effects and non-pharmacologic comfort measures Outcome: Progressing   Problem: Health Behavior/Discharge Planning: Goal: Ability to manage health-related needs will improve Outcome: Progressing   Problem: Clinical Measurements: Goal: Ability to maintain clinical measurements within normal limits will improve Outcome: Progressing Goal: Will remain free from infection Outcome: Progressing Goal: Diagnostic test results will improve Outcome: Progressing Goal: Respiratory complications will improve Outcome: Progressing Goal: Cardiovascular complication will be avoided Outcome: Progressing   Problem: Activity: Goal: Risk for activity intolerance will decrease Outcome: Progressing   Problem: Nutrition: Goal: Adequate nutrition will be maintained Outcome: Progressing   Problem: Coping: Goal: Level of anxiety will decrease Outcome: Progressing   Problem: Elimination: Goal: Will not experience complications related to bowel motility Outcome: Progressing Goal: Will not experience complications related to urinary retention Outcome: Progressing   Problem: Pain Managment: Goal: General experience of comfort will improve Outcome: Progressing   Problem: Safety: Goal: Ability to remain free from injury will improve Outcome: Progressing   Problem: Skin Integrity: Goal: Risk for impaired skin integrity will decrease Outcome: Progressing

## 2021-08-29 NOTE — Progress Notes (Addendum)
PROGRESS NOTE  Bill Garza MOQ:[id number] DOB: 23-Sep-1960 DOA: 08/25/2021 PCP: Aleen Campi, NP  HPI/Recap of past 24 hours: 61 y.o. 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 Presenting with intractable back pain and abdominal pain-  imaging w/ new portal vein thrombosis-started on anticoagulation after discussing with [name], palliative care and hematology consulted and following.  Appreciate consultants assistance.  On admission: MR lumbar thoracic spine shows-new and progressive thoracic and lumbosacral osseous metastatic disease without significant extraosseous extension or pathologic fracture, enlarging bilateral iliac bone and upper abdominal nodal metastatic disease, unchanged multilevel lumbar spondylosis.    Due to intractable pain not improved with as needed IV opioids, the patient was started on PCA Dilaudid pump.  08/29/21: Seen at bedside.  Complains of bilateral lower extremity edema.  IV fluid discontinued.  The patient and wife also request that Lyrica be discontinued.  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 has consulted IR to consider RFA to L4 or L5 nerve root.   Palliative care  also consult IR to look for iliac bone freezing.  Liver MRI shows innumerable more than 30 liver masses scattered throughout new since 3/31, several enhancing osseous lesions throughout the visualized skeleton increased since 3/31, mild to moderate intrahepatic biliary duct dilatation, no choledocholithiasis, small gallbladder sludge PCA Dilaudid pump, methadone, Flexeril, IV Decadron and Lyrica. Lyrica DC'd on 08/29/2021 per patient and family request.   Portal vein thrombosis:  Continue heparin drip and switch to DOAC closer to discharge.  On admission neurosurgery team Dr. Salomon Fick was consulted about anticoagulation and there is no contraindication to [facility] at this point w/ respect to his brain.  Elevated liver chemistries Continue to closely monitor Continue to avoid hepatotoxic agents as possible  Bilateral lower extremity edema likely contributed by hypoalbuminemia Albumin 2.6 Elevate lower extremities  Resolved non-anion gap metabolic acidosis Serum bicarb 23 from 19 and anion gap of 8 Continue to encourage oral intake   DM2 with hyperglycemia:  A1c 7.1 blood sugar poorly controlled, likely in the setting of patient's Decadron increase sliding scale increase to 0-15 unit Continue to hold metformin   Chronic anxiety/depression:  Continue home regimen.   COPD Tobacco abuse: Not in exacerbation continue home inhalers-Dulera, Singulair   Elevated alkaline phosphatase level:  Likely from bone metastasis    Resolved SIRS On admission mild tachycardia, leukocytosis 13.3.  No obvious infection source noted patient had CT chest abdomen pelvis leukocytosis has resolved likely reactive.  UA unremarkable with negative nitrate and leukocytes.  Holding off on antibiotics.   Afebrile, continue to monitor Of IV fluid on 08/29/2021 due to bilateral lower extremity edema.   Normocytic anemia Hemoglobin stable 10.4 from 10.5. No overt bleeding  Resolved hyponatremia:  Serum sodium  138.  Critical care time: 65 minutes        Goals of care: Oncology has discussed extensively patient has an incurable malignancy continues to desire full code.  DNR has been recommended, palliative care on board. Cont pain control as #1   DVT prophylaxis: SCDs Start: 08/25/21 1527, heparin drip. Code Status:   Code Status: Full Code Family Communication: Plan of care discussed with the patient's wife via phone. Patient status is: Inpatient because of ongoing need for IV pain management with intractable pain Level of care: Telemetry    Dispo: The patient is from: Home            Anticipated disposition: Pending pain control.        Status is: Inpatient The patient requires at least 2 midnights for further evaluation and treatment of present condition.    Objective: Vitals:   08/29/21 0754 08/29/21 1000 08/29/21 1110 08/29/21 1311  BP:  132/73  121/67  Pulse:  77  93  Resp: 18  18 20   Temp:  (!) 97.5 F (36.4 C)  97.7 F (36.5 C)  TempSrc:  Oral  Oral  SpO2: 95% 99% 95% 92%  Weight:      Height:        Intake/Output Summary (Last 24 hours) at 08/29/2021 1507 Last data filed at 08/29/2021 1300 Gross per 24 hour  Intake 941.09 ml  Output 500 ml  Net 441.09 ml   Filed Weights   08/25/21 0809  Weight: 72.1 kg    Exam:  General: 61 y.o. year-old male frail-appearing in no acute distress.  He is alert and oriented x3.   Cardiovascular: Regular rate and rhythm no rubs or gallops. Respiratory: Clear to station no wheezes or rales.   Abdomen: Soft noted normal bowel sounds present. Musculoskeletal: 1+ pitting edema in lower extremities bilaterally.   Skin: No ulcerative lesions noted. Psychiatry: Mood is appropriate for condition and setting.   Data Reviewed: CBC: Recent Labs  Lab 08/23/21 1320 08/25/21 0812 08/26/21 0532 08/27/21 0231 08/28/21 0601 08/29/21 0616  WBC 13.3* 12.8* 9.9 10.0 13.8* 12.2*  NEUTROABS 10.0* 10.4*  --   --   --   --   HGB  13.2 12.0* 10.3* 11.1* 10.5* 10.4*  HCT 39.0 36.3* 32.1* 34.2* 32.7* 32.7*  MCV 90.3 91.2 95.5 94.2 94.5 95.3  PLT 670* 591* 523* 556* 446* 315*   Basic Metabolic Panel: Recent Labs  Lab 08/25/21 0812 08/26/21 0532 08/27/21 0231 08/28/21 0601 08/29/21 0616  NA 132* 136 134* 137 138  K 3.5 3.3* 4.4 4.2 4.4  CL 102 107 106 108 107  CO2 20* 19* 21* 21* 23  GLUCOSE 153* 127* 236* 210* 190*  BUN 11 7 9 8 7   CREATININE 0.49* 0.49* 0.52* 0.52* 0.51*  CALCIUM 8.4* 6.9* 8.1* 8.4* 8.6*   GFR: Estimated Creatinine Clearance: 102.6 mL/min (A) (by C-G formula based on SCr of 0.51 mg/dL (L)). Liver Function Tests: Recent Labs  Lab 08/23/21 1403 08/25/21 1761 08/26/21 0532 08/28/21 0601 08/29/21 0616  AST 20 24 25  55* 63*  ALT 27 25 22  43 52*  ALKPHOS 742* 710* 644* 904* 858*  BILITOT 0.5 0.9 0.6 0.5 0.4  PROT 6.2* 6.4* 5.3* 5.9* 6.0*  ALBUMIN 3.3* 2.7* 2.2* 2.5* 2.6*   Recent Labs  Lab 08/25/21 0812  LIPASE 19   No results for input(s): "AMMONIA" in the last 168 hours. Coagulation Profile: Recent Labs  Lab 08/25/21 0816  INR 1.6*   Cardiac Enzymes: No  results for input(s): "CKTOTAL", "CKMB", "CKMBINDEX", "TROPONINI" in the last 168 hours. BNP (last 3 results) No results for input(s): "PROBNP" in the last 8760 hours. HbA1C: No results for input(s): "HGBA1C" in the last 72 hours.  CBG: Recent Labs  Lab 08/28/21 1232 08/28/21 1627 08/28/21 2001 08/29/21 0735 08/29/21 1156  GLUCAP 114* 119* 131* 167* 110*   Lipid Profile: No results for input(s): "CHOL", "HDL", "LDLCALC", "TRIG", "CHOLHDL", "LDLDIRECT" in the last 72 hours. Thyroid Function Tests: No results for input(s): "TSH", "T4TOTAL", "FREET4", "T3FREE", "THYROIDAB" in the last 72 hours. Anemia Panel: No results for input(s): "VITAMINB12", "FOLATE", "FERRITIN", "TIBC", "IRON", "RETICCTPCT" in the last 72 hours.  Urine analysis:    Component Value Date/Time   COLORURINE YELLOW 08/25/2021 [place] 08/25/2021 0749   LABSPEC 1.006 08/25/2021 0749   PHURINE 7.0 08/25/2021 0749   GLUCOSEU NEGATIVE 08/25/2021 0749   HGBUR NEGATIVE 08/25/2021 0749   BILIRUBINUR NEGATIVE 08/25/2021 0749   KETONESUR 20 (A) 08/25/2021 0749   PROTEINUR NEGATIVE 08/25/2021 0749   NITRITE NEGATIVE 08/25/2021 0749   LEUKOCYTESUR NEGATIVE 08/25/2021 0749   Sepsis Labs: @LABRCNTIP (procalcitonin:4,lacticidven:4)  ) Recent Results (from the past 240 hour(s))  Culture, blood (Routine X 2) w Reflex to ID Panel     Status: None (Preliminary result)   Collection Time: 08/25/21  3:01 PM   Specimen: Right Antecubital; Blood  Result Value Ref Range Status   Specimen Description   Final    RIGHT ANTECUBITAL BLOOD Performed at [facility], [address] [place], [place] [address]    Special Requests   Final    Blood Culture adequate volume BOTTLES DRAWN AEROBIC AND ANAEROBIC Performed at [facility], [address] [place], [place] [address]    Culture   Final    NO GROWTH 3 DAYS Performed at [facility], [place] [address] [place], [place] [address]    Report Status PENDING  Incomplete  Culture, blood (Routine X 2) w Reflex to ID Panel     Status: Abnormal   Collection Time: 08/25/21  5:30 PM   Specimen: BLOOD  Result Value Ref Range Status   Specimen Description BLOOD SITE NOT SPECIFIED  Final   Special Requests IN PEDIATRIC BOTTLE Blood Culture adequate volume  Final   Culture  Setup Time   Final    GRAM POSITIVE COCCI IN CLUSTERS IN PEDIATRIC BOTTLE CRITICAL RESULT CALLED TO, READ BACK BY AND VERIFIED WITH: L POINDEXTER,[email or web address]  08/26/21 [place]    Culture (A)  Final    STAPHYLOCOCCUS EPIDERMIDIS THE SIGNIFICANCE OF ISOLATING THIS ORGANISM FROM A SINGLE VENIPUNCTURE CANNOT BE PREDICTED WITHOUT FURTHER CLINICAL AND CULTURE CORRELATION. SUSCEPTIBILITIES AVAILABLE ONLY ON REQUEST. Performed at [facility], [place] [address] [place], [place] [address]     Report Status 08/28/2021 FINAL  Final  Blood Culture ID Panel (Reflexed)     Status: Abnormal   Collection Time: 08/25/21  5:30 PM  Result Value Ref Range Status   Enterococcus faecalis NOT DETECTED NOT DETECTED Final   Enterococcus Faecium NOT DETECTED NOT DETECTED Final   Listeria monocytogenes NOT DETECTED NOT DETECTED Final   Staphylococcus species DETECTED (A) NOT DETECTED Final    Comment: CRITICAL RESULT CALLED TO, READ BACK BY AND VERIFIED WITH: L POINDEXTER,[email or web address]  08/26/21 [place]    Staphylococcus aureus (BCID) NOT DETECTED NOT DETECTED Final   Staphylococcus epidermidis DETECTED (A) NOT DETECTED Final    Comment: CRITICAL RESULT CALLED TO, READ BACK  BY AND VERIFIED WITH: L POINDEXTER,[email or web address]  08/26/21 [place]    Staphylococcus lugdunensis NOT DETECTED NOT DETECTED Final   Streptococcus species NOT DETECTED NOT DETECTED Final   Streptococcus agalactiae NOT DETECTED NOT DETECTED Final   Streptococcus pneumoniae NOT DETECTED NOT DETECTED Final   Streptococcus pyogenes NOT DETECTED NOT DETECTED Final   A.calcoaceticus-baumannii NOT DETECTED NOT DETECTED Final   Bacteroides fragilis NOT DETECTED NOT DETECTED Final   Enterobacterales NOT DETECTED NOT DETECTED Final   Enterobacter cloacae complex NOT DETECTED NOT DETECTED Final   Escherichia coli NOT DETECTED NOT DETECTED Final   Klebsiella aerogenes NOT DETECTED NOT DETECTED Final   Klebsiella oxytoca NOT DETECTED NOT DETECTED Final   Klebsiella pneumoniae NOT DETECTED NOT DETECTED Final   Proteus species NOT DETECTED NOT DETECTED Final   Salmonella species NOT DETECTED NOT DETECTED Final   Serratia marcescens NOT DETECTED NOT DETECTED Final   Haemophilus influenzae NOT DETECTED NOT DETECTED Final   Neisseria meningitidis NOT DETECTED NOT DETECTED Final   Pseudomonas aeruginosa NOT DETECTED NOT DETECTED Final   Stenotrophomonas maltophilia NOT DETECTED NOT DETECTED Final   Candida albicans NOT DETECTED NOT DETECTED Final    Candida auris NOT DETECTED NOT DETECTED Final   Candida glabrata NOT DETECTED NOT DETECTED Final   Candida krusei NOT DETECTED NOT DETECTED Final   Candida parapsilosis NOT DETECTED NOT DETECTED Final   Candida tropicalis NOT DETECTED NOT DETECTED Final   Cryptococcus neoformans/gattii NOT DETECTED NOT DETECTED Final   Methicillin resistance mecA/C NOT DETECTED NOT DETECTED Final    Comment: Performed at [facility], [address], [place],  [place] [address]      Studies: No results found.  Scheduled Meds:  cyclobenzaprine  5 mg Oral TID   dexamethasone (DECADRON) injection  8 mg Intravenous Q24H   dronabinol  2.5 mg Oral TID AC   escitalopram  5 mg Oral Daily   feeding supplement  237 mL Oral BID BM   HYDROmorphone   Intravenous Q4H   insulin aspart  0-15 Units Subcutaneous TID WC   lactulose  13.3333-20 g Oral BID   methadone  10 mg Oral Q8H   metoCLOPramide (REGLAN) injection  5 mg Intravenous Q6H   mometasone-formoterol  2 puff Inhalation BID   montelukast  10 mg Oral QHS   multivitamin with minerals  1 tablet Oral Daily   pantoprazole  40 mg Oral Daily   senna-docusate  2 tablet Oral BID   traZODone  50 mg Oral QHS    Continuous Infusions:  heparin 1,600 Units/hr (08/29/21 1131)     LOS: 3 days     Kayleen Memos, MD [facility] Pager [phone]  If 7PM-7AM, please contact night-coverage [email or web address] Password [facility] 08/29/2021, 3:07 PM

## 2021-08-29 NOTE — Consult Note (Signed)
Chief Complaint: Patient was seen in consultation today for RFA to L4 or L5 nerve root Chief Complaint  Patient presents with   Back Pain   Abdominal Pain   Leg Pain   at the request of Bill Garza  Referring Physician(s): Bill Garza  Supervising Physician: Bill Garza  Patient Status: [facility] - In-pt  History of Present Illness: Bill Garza is a 61 y.o. 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 ED on 08/25/21  with CC of back, abdominal, and leg pain is currently admitted due to intractable lower back pain.   Imaging has shown features compatible with progression of metastatic Disease with new hepatic metastasis with new small pulmonary nodules, enlarged upper abdominal lymph nodes and progression of bone disease. New and progressive thoracic and lumbosacral osseous metastatic disease without significant extraosseous extension or pathologic fracture, enlarging bilateral iliac bone and upper abdominal nodal metastatic disease, unchanged multilevel lumbar spondylosis. 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   Oncology and palliative care consulted for management of intractable back pain.  IR was requested for RFA to L4 or L5 nerve root by oncology, case was reviewed and approved by Dr. [name].  Of note, IR was also requested for Osteocool of right iliac bone lesion, and Dr. [name] was reached out by palliative care. After discussion, decision was made to proceed with nerve root block first, and if patient continues to have iliac bone pain, will proceed with Osteocool.  Patient seen in bedside.  Patient sitting on the side of the bed, not in acute distress.  Reports that his back pain is somewhat controlled with PCA, but it is still severe when pain medicine weans off. [name] headache, fever,  chills, shortness of breath, cough, chest pain, abdominal pain, nausea ,vomiting, and bleeding.    Past Medical History:  Diagnosis Date   Chronic back pain    Chronic GERD    Chronic pain    COPD (chronic obstructive pulmonary disease) (HCC)    Coronary artery disease    Depression    Diabetes mellitus without complication (HCC)    Gastroparesis    Goals of care, counseling/discussion 01/27/2021   High risk medication use    Hyperlipidemia    MI (myocardial infarction) ([name])    Non-small cell lung cancer metastatic to brain ([name]) 01/27/2021   Restless leg syndrome     Past Surgical History:  Procedure Laterality Date   BACK SURGERY     CARDIAC CATHETERIZATION      Allergies: Gabapentin, Levetiracetam, Pregabalin, Bupropion, Duloxetine, Ezetimibe, Other, Prochlorperazine, and Lamotrigine  Medications: Prior to Admission medications   Medication Sig Start Date End Date Taking? Authorizing Provider  albuterol (PROVENTIL) (2.5 MG/3ML) 0.083% nebulizer solution Take 2.5 mg by nebulization daily as needed for shortness of breath or wheezing. 01/25/21  Yes [provider]  albuterol (VENTOLIN HFA) 108 (90 Base) MCG/ACT inhaler Inhale 2 puffs into the lungs every 4 (four) hours as needed for wheezing or shortness of breath. Patient taking differently: Inhale 2 puffs into the lungs 2 (two) times daily as needed for wheezing or shortness of breath. 07/14/21  Yes Pickenpack-Cousar, Carlena Sax, NP  aspirin EC 81 MG tablet Take 81 mg by mouth daily.   Yes [provider]  budesonide-formoterol (SYMBICORT) 160-4.5 MCG/ACT inhaler Inhale 2 puffs into the lungs 2 (two) times daily. 07/14/21  Yes  Pickenpack-Cousar, Carlena Sax, NP  dexamethasone (DECADRON) 4 MG tablet Take 2 tablets (8 mg total) by mouth 2 (two) times daily. 08/13/21  Yes [name], MD  dronabinol (MARINOL) 2.5 MG capsule Take 1 capsule (2.5 mg total) by mouth 3 (three) times daily before meals. 08/13/21  Yes  Ennever, Rudell Cobb, MD  escitalopram (LEXAPRO) 5 MG tablet Take 5 mg by mouth daily.   Yes [provider]  fluocinolone (VANOS) 0.01 % cream Apply 1 application. topically daily as needed (rash). 03/18/20  Yes [provider]  lactulose (CHRONULAC) 10 GM/15ML solution Take 20-30 mLs by mouth 2 (two) times daily.   Yes [provider]  LORazepam (ATIVAN) 1 MG tablet Take 1 tablet (1 mg total) by mouth every 6 (six) hours as needed for anxiety. Patient taking differently: Take 1 mg by mouth See admin instructions. Take 1 mg by mouth 2-3 times a day as needed for anxiety/ nausea 08/20/21  Yes Ennever, Rudell Cobb, MD  metFORMIN (GLUCOPHAGE) 500 MG tablet Take 500 mg by mouth daily.   Yes [provider]  methocarbamol (ROBAXIN) 500 MG tablet Take 1 tablet (500 mg total) by mouth every 8 (eight) hours as needed for muscle spasms. Patient taking differently: Take 500 mg by mouth 2 (two) times daily as needed for muscle spasms. 07/14/21  Yes Pickenpack-Cousar, Carlena Sax, NP  montelukast (SINGULAIR) 10 MG tablet Take 10 mg by mouth at bedtime.   Yes [provider]  Multiple Vitamin (MULTIVITAMIN) capsule Take 1 capsule by mouth daily.   Yes [provider]  naloxone (NARCAN) nasal spray 4 mg/0.1 mL Place into the nose. 12/05/20  Yes [provider]  oxyCODONE-acetaminophen (PERCOCET) 10-325 MG tablet Take 1 tablet by mouth every 4 (four) hours as needed for pain. Patient taking differently: Take 1-2 tablets by mouth every 4 (four) hours as needed for pain. 08/19/21  Yes [name], MD  pantoprazole (PROTONIX) 40 MG tablet Take 40 mg by mouth daily.   Yes [provider]  senna-docusate (SENOKOT-S) 8.6-50 MG tablet Take 2 tablets by mouth 2 (two) times daily. 07/26/21  Yes [name], MD  ACCU-CHEK GUIDE test strip  11/20/20   [provider]  Blood Glucose Monitoring Suppl (ACCU-CHEK GUIDE) w/Device KIT USE AS DIRECTED TO CHECK  BLOOD SUGAR Patient not taking: Reported on 07/26/2021 11/20/20   [provider]  Blood Glucose Monitoring Suppl (GLUCOCOM BLOOD GLUCOSE MONITOR) [place]  11/20/20   [provider]  ketorolac (TORADOL) 10 MG tablet Take 1 tablet (10 mg total) by mouth every 8 (eight) hours as needed. Patient not taking: Reported on 08/26/2021 08/12/21   Pickenpack-Cousar, Carlena Sax, NP  methadone (DOLOPHINE) 10 MG tablet Take 1 tablet (10 mg total) by mouth every 8 (eight) hours. Patient not taking: Reported on 08/26/2021 08/12/21   Pickenpack-Cousar, Carlena Sax, NP  ondansetron (ZOFRAN-ODT) 8 MG disintegrating tablet Take 1 tablet (8 mg total) by mouth every 8 (eight) hours as needed for nausea or vomiting. Patient not taking: Reported on 08/26/2021 08/23/21   [name], MD  polyethylene glycol (MIRALAX / GLYCOLAX) 17 g packet Take 17 g by mouth daily. Patient not taking: Reported on 08/13/2021    [provider]  promethazine (PHENERGAN) 25 MG suppository Place 1 suppository (25 mg total) rectally every 6 (six) hours as needed for nausea or vomiting. Patient not taking: Reported on 08/26/2021 08/20/21   [name], MD     History reviewed.  No pertinent family history.  Social History   Socioeconomic History   Marital status: Married    Spouse name: Not on file   Number of children: Not on file   Years of education: Not on file   Highest education level: Not on file  Occupational History   Not on file  Tobacco Use   Smoking status: Former    Packs/day: 1.00    Years: 40.00    Total pack years: 40.00    Types: Cigarettes    Quit date: 07/20/2020    Years since quitting: 1.1   Smokeless tobacco: Never  Vaping Use   Vaping Use: Never used  Substance and Sexual Activity   Alcohol use: Not Currently   Drug use: Not Currently   Sexual activity: Not on file  Other Topics Concern   Not on file  Social History Narrative   Not on file   Social Determinants of Health   Financial  Resource Strain: Not on file  Food Insecurity: Not on file  Transportation Needs: Not on file  Physical Activity: Not on file  Stress: Not on file  Social Connections: Not on file     Review of Systems: A 12 point ROS discussed and pertinent positives are indicated in the HPI above.  All other systems are negative.  Vital Signs: BP (!) 153/90 (BP Location: Right Arm)   Pulse 86   Temp 97.7 F (36.5 C) (Oral)   Resp 18   Ht 6' (1.829 m)   Wt 159 lb (72.1 kg)   SpO2 95%   BMI 21.56 kg/m    Physical Exam Vitals reviewed.  Constitutional:      General: He is not in acute distress.    Appearance: He is not ill-appearing.  HENT:     Head: Normocephalic.  Cardiovascular:     Rate and Rhythm: Normal rate.     Heart sounds: Normal heart sounds.     Comments: Skips a beat every 6-7 beats  Pulmonary:     Effort: Pulmonary effort is normal.     Breath sounds: Normal breath sounds.  Abdominal:     General: Abdomen is flat. Bowel sounds are normal.     Palpations: Abdomen is soft.  Musculoskeletal:        General: Tenderness present.     Cervical back: Neck supple.     Comments: Severe TTP in lower back   Skin:    General: Skin is warm and dry.  Neurological:     Mental Status: He is alert and oriented to person, place, and time.  Psychiatric:        Mood and Affect: Mood normal.        Behavior: Behavior normal.     MD Evaluation Airway: WNL Heart: WNL Abdomen: WNL Chest/ Lungs: WNL ASA  Classification: 3 Mallampati/Airway Score: Two  Imaging: MR LIVER W WO CONTRAST  Result Date: 08/27/2021 CLINICAL DATA:  Inpatient. Metastatic non-small cell lung cancer. Indeterminate liver lesions on recent sonogram. Progression of metastatic disease in the liver, lymph nodes and bones on CT from 1 day prior. EXAM: MRI ABDOMEN WITHOUT AND WITH CONTRAST TECHNIQUE: Multiplanar multisequence MR imaging of the abdomen was performed both before and after the administration of  intravenous contrast. CONTRAST:  70m GADAVIST GADOBUTROL 1 MMOL/ML IV SOLN COMPARISON:  06/22/2021 PET-CT. 08/24/2021 abdominal sonogram. 08/25/2021 CT abdomen/pelvis. FINDINGS: Lower chest: Solid 0.5 cm medial dependent right lung base pulmonary nodule (series 8/image 5) as better seen on recent  CT. Hepatobiliary: Mild hepatomegaly. Background diffuse hepatic hemosiderosis. Innumerable (> 30) enhancing solid liver masses scattered throughout the liver, confluent in the right liver, with restricted diffusion, compatible with metastatic disease, generally new since 06/18/2021 CT. Representative 2.0 x 1.8 cm caudate lobe mass (series 5/image 9). Representative segment 7 right liver 2.9 x 2.1 cm mass (series 5/image 9). Representative 1.5 x 1.3 cm segment 4B left liver mass (series 5/image 16). Small amount of layering sludge in gallbladder. Mild diffuse gallbladder wall thickening, asymmetrically prominent adjacent to the liver. No cholelithiasis. No pericholecystic fluid. Mild-to-moderate intrahepatic biliary ductal dilatation throughout the right liver with relative sparing of the left liver, with central extrinsic biliary compression by the confluent central right liver metastases. Top normal caliber common bile duct (6 mm diameter) with smooth distal tapering. No choledocholithiasis. Pancreas: No pancreatic mass or duct dilation.  No pancreas divisum. Spleen: Normal size spleen. Diffuse splenic hemosiderosis. A few scattered tiny T2 hyperintense subcentimeter splenic lesions are unchanged from 06/18/2021 CT, considered benign. Adrenals/Urinary Tract: Normal adrenals. No hydronephrosis. Normal kidneys with no renal mass. Stomach/Bowel: Normal non-distended stomach. Visualized small and large bowel is normal caliber, with no bowel wall thickening. Vascular/Lymphatic: Normal caliber abdominal aorta. Patent renal, splenic and hepatic veins. Extrinsic narrowing of the right portal vein tree by the confluent right liver  metastatic disease. Portal veins appear grossly patent, with evaluation limited by motion degradation. Enlarged 1.7 cm short axis diameter portacaval node (series 8/image 16) and a few mildly enlarged aortocaval nodes, largest 1.0 cm (series 8/image 24), all newly enlarged since 06/18/2021 CT. Other: No abdominal ascites or focal fluid collection. Musculoskeletal: Several enhancing osseous lesions throughout the visualized axial skeleton, for example 2.4 cm in the T11 vertebral body (series 3/image 23), not clearly seen on 06/18/2021 CT, and expansile 4.3 cm right iliac crest lesion (series 3/image 14), increased from 0.8 cm on 06/18/2021 CT. IMPRESSION: 1. Innumerable (> 30) enhancing liver masses scattered throughout the liver, confluent in the right liver, compatible with metastatic disease, generally new since 06/18/2021 CT. 2. Several enhancing osseous lesions throughout the visualized axial skeleton, increased from 06/18/2021 CT, compatible with progressive multifocal osseous metastatic disease. 3. Mild-to-moderate intrahepatic biliary ductal dilatation throughout the right liver and extrinsic narrowing of the right portal vein tree by the confluent central right liver metastatic disease. Top normal caliber common bile duct (6 mm diameter) with smooth distal tapering. No choledocholithiasis. 4. Small amount of gallbladder sludge. Nonspecific mild gallbladder wall thickening, potentially reactive or due to noninflammatory edema. No cholelithiasis. 5. 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 Electronically Signed   By: Ilona Sorrel M.D.   On: 08/27/2021 08:22   MR Lumbar Spine W Wo Contrast  Result Date: 08/25/2021 CLINICAL DATA:  Worsening back and right leg pain. History of metastatic lung cancer. EXAM: MRI THORACIC AND LUMBAR SPINE WITHOUT AND WITH CONTRAST TECHNIQUE: Multiplanar and multiecho pulse sequences of the thoracic and lumbar spine were obtained without and with intravenous contrast. CONTRAST:  45m  GADAVIST GADOBUTROL 1 MMOL/ML IV SOLN COMPARISON:  CT lumbar myelogram dated August 20, 2021. MRI thoracic and lumbar spine dated Jul 30, 2021. FINDINGS: MRI THORACIC SPINE FINDINGS Alignment:  Physiologic. Vertebrae: T3, T6, T8, and T11 metastases have increased in size. New tiny lesions at T10 and T12 (series 17, images 5 and 7). No epidural tumor extension. No fracture or evidence of discitis. Cord: Normal signal and morphology. No abnormal intrathecal enhancement. Paraspinal and other soft tissues: Negative. Disc levels: Unchanged small right paracentral  disc protrusions at T1-T2 and T2-T3. No spinal canal or neuroforaminal stenosis at any level. MRI LUMBAR SPINE FINDINGS Segmentation:  Standard. Alignment:  Unchanged mild retrolisthesis at L3-L4. Vertebrae: L4 and S2 vertebral body metastases have increased in size. New L1, L2, L3, S1, and S3 vertebral body metastases. No epidural tumor extension. No fracture or evidence of discitis. Conus medullaris: Extends to the L1-L2 level and appears normal. No abnormal intrathecal enhancement. Paraspinal and other soft tissues: Enlarging metastases in the bilateral posterior iliac bones. Enlarging upper abdominal lymphadenopathy. Disc levels: T12-L1:  Negative. L1-L2:  Negative. L2-L3: Negative disc. Unchanged mild bilateral facet arthropathy. No stenosis. L3-L4: Unchanged mild disc bulging with superimposed right subarticular disc protrusion. Unchanged mild bilateral facet arthropathy. Unchanged mild spinal canal stenosis. Unchanged moderate right and mild left lateral recess stenosis. Unchanged mild bilateral neuroforaminal stenosis. L4-L5: Prior left hemilaminectomy. Unchanged mild disc bulging with superimposed right extraforaminal disc protrusion contacting the exiting right L4 nerve root. Unchanged mild right lateral recess and bilateral neuroforaminal stenosis. No spinal canal stenosis. L5-S1: Unchanged small left paracentral disc protrusion and mild bilateral facet  arthropathy. No stenosis. IMPRESSION: 1. New and progressive thoracic and lumbosacral osseous metastatic disease without significant extraosseous extension or pathologic fracture. 2. Enlarging bilateral iliac bone and upper abdominal nodal metastatic disease. 3. Unchanged multilevel lumbar spondylosis as described above. Right extraforaminal disc protrusion contacts and potentially irritates the exiting right L4 nerve root. Electronically Signed   By: Titus Dubin M.D.   On: 08/25/2021 14:55   MR THORACIC SPINE W WO CONTRAST  Result Date: 08/25/2021 CLINICAL DATA:  Worsening back and right leg pain. History of metastatic lung cancer. EXAM: MRI THORACIC AND LUMBAR SPINE WITHOUT AND WITH CONTRAST TECHNIQUE: Multiplanar and multiecho pulse sequences of the thoracic and lumbar spine were obtained without and with intravenous contrast. CONTRAST:  49m GADAVIST GADOBUTROL 1 MMOL/ML IV SOLN COMPARISON:  CT lumbar myelogram dated August 20, 2021. MRI thoracic and lumbar spine dated Jul 30, 2021. FINDINGS: MRI THORACIC SPINE FINDINGS Alignment:  Physiologic. Vertebrae: T3, T6, T8, and T11 metastases have increased in size. New tiny lesions at T10 and T12 (series 17, images 5 and 7). No epidural tumor extension. No fracture or evidence of discitis. Cord: Normal signal and morphology. No abnormal intrathecal enhancement. Paraspinal and other soft tissues: Negative. Disc levels: Unchanged small right paracentral disc protrusions at T1-T2 and T2-T3. No spinal canal or neuroforaminal stenosis at any level. MRI LUMBAR SPINE FINDINGS Segmentation:  Standard. Alignment:  Unchanged mild retrolisthesis at L3-L4. Vertebrae: L4 and S2 vertebral body metastases have increased in size. New L1, L2, L3, S1, and S3 vertebral body metastases. No epidural tumor extension. No fracture or evidence of discitis. Conus medullaris: Extends to the L1-L2 level and appears normal. No abnormal intrathecal enhancement. Paraspinal and other soft  tissues: Enlarging metastases in the bilateral posterior iliac bones. Enlarging upper abdominal lymphadenopathy. Disc levels: T12-L1:  Negative. L1-L2:  Negative. L2-L3: Negative disc. Unchanged mild bilateral facet arthropathy. No stenosis. L3-L4: Unchanged mild disc bulging with superimposed right subarticular disc protrusion. Unchanged mild bilateral facet arthropathy. Unchanged mild spinal canal stenosis. Unchanged moderate right and mild left lateral recess stenosis. Unchanged mild bilateral neuroforaminal stenosis. L4-L5: Prior left hemilaminectomy. Unchanged mild disc bulging with superimposed right extraforaminal disc protrusion contacting the exiting right L4 nerve root. Unchanged mild right lateral recess and bilateral neuroforaminal stenosis. No spinal canal stenosis. L5-S1: Unchanged small left paracentral disc protrusion and mild bilateral facet arthropathy. No stenosis. IMPRESSION: 1. New and  progressive thoracic and lumbosacral osseous metastatic disease without significant extraosseous extension or pathologic fracture. 2. Enlarging bilateral iliac bone and upper abdominal nodal metastatic disease. 3. Unchanged multilevel lumbar spondylosis as described above. Right extraforaminal disc protrusion contacts and potentially irritates the exiting right L4 nerve root. Electronically Signed   By: Titus Dubin M.D.   On: 08/25/2021 14:55   CT ABDOMEN PELVIS W CONTRAST  Result Date: 08/25/2021 CLINICAL DATA:  61 year old with acute abdominal pain. History of metastatic lung cancer. EXAM: CT ABDOMEN AND PELVIS WITH CONTRAST TECHNIQUE: Multidetector CT imaging of the abdomen and pelvis was performed using the standard protocol following bolus administration of intravenous contrast. RADIATION DOSE REDUCTION: This exam was performed according to the departmental dose-optimization program which includes automated exposure control, adjustment of the mA and/or kV according to patient size and/or use of  iterative reconstruction technique. CONTRAST:  130m OMNIPAQUE IOHEXOL 300 MG/ML  SOLN COMPARISON:  CTA chest, abdomen and pelvis 05/27/2021 and CT of the chest, abdomen and pelvis on 06/18/2021 and PET/CT 06/22/2021. Thoracic and lumbar MRI 07/30/2021. Abdominal ultrasound 08/24/2021. FINDINGS: Lower chest: Again noted are small pulmonary nodules in the right lower lobe. There is an dominant nodule in the right lower lobe that measures approximately 6 mm minimally changed since 05/27/2021. However, there is a new punctate nodule in the medial right lower lobe on sequence 6, image 25. In addition, there appears to be a new 3 mm nodule in the right lower lobe on sequence 6 image 7. New punctate nodule in the right middle lobe on image 22. Findings are suggestive for small lung metastases. Hepatobiliary: There are innumerable low-density lesions scattered throughout the liver of which are new and suggestive for hepatic metastatic disease. In addition, there is diffuse narrowing of the main right portal vein and evidence for thrombus involving right intrahepatic portal venous branches. The main and left intrahepatic portal veins appear to be patent. Poorly defined low-density structures throughout the right hepatic lobe are most with metastatic disease, most prominent in segment 8. Gallbladder is mildly distended. Mild pericholecystic fluid associated with the gallbladder. Difficult to exclude intrahepatic biliary dilatation in the right hepatic lobe. Pancreas: Unremarkable. No pancreatic ductal dilatation or surrounding inflammatory changes. Spleen: There are least 2 small low-density structures in the spleen that are stable. Spleen is normal in size. Adrenals/Urinary Tract: Normal adrenal glands. Normal appearance of both kidneys without hydronephrosis. No suspicious renal lesions. No definite kidney stones. Normal appearance of the urinary bladder. Stomach/Bowel: Stomach is within normal limits. Appendix appears  normal. No evidence of bowel wall thickening, distention, or inflammatory changes. Vascular/Lymphatic: Atherosclerotic disease in the abdominal aorta without aneurysm. Main visceral arteries are patent. Enlarged low-density lymph node in the upper abdominal pre caval region on sequence 2 image 29. This precaval lymph node measures 1.5 cm in short axis and previously measured 1.3 cm on 06/22/2021. Enlarged retroperitoneal lymph nodes around the IVC. There is another index lymph node between the IVC and the aorta on image 34 that measures 1.0 cm in short axis. Thrombosis of right portal vein branches. Narrowing of the main right portal vein appears new. Main portal vein and portal vein confluence appear to be patent although the main portal vein is adjacent to the enlarged precaval lymph node. Reproductive: Prostate is unremarkable. Other: No significant ascites other than the minimal fluid around the gallbladder. Umbilical hernia containing fat. Bilateral inguinal hernias containing fat. Musculoskeletal: Expansile lytic lesion involving the anterior aspect of the right iliac wing that measures  3.4 x 3.1 cm and markedly enlarged since 06/18/2021. Again noted is lucency along the anterior L4 vertebral body and suggestive for a lucent bone lesion. There is also a lesion along the posteroinferior aspect of the T11 vertebral body suggestive for bone metastasis. Again noted is a lucent lesion involving the left ilium on sequence 2 image 54. IMPRESSION: 1. Imaging findings are compatible with progression of metastatic disease. New hepatic metastasis with new small pulmonary nodules, enlarged upper abdominal lymph nodes and progression of bone disease. 2. Significant change in the appearance of the liver with innumerable hepatic lesions. 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 The main right portal vein is also narrowed compared to the previous  examination. 3. Small amount of pericholecystic fluid is nonspecific. Gallbladder is mildly distended. Findings are similar to the recent ultrasound findings. These findings could be associated with the hepatic disease. Ultrasound and CT findings are equivocal for cholecystitis and consider further evaluation of the gallbladder with a HIDA scan (if clinically indicated). 4. Scattered bony lesions compatible with metastatic disease. Enlargement of a lytic expansile lesion involving the right iliac wing. 5. Umbilical hernia.  Bilateral inguinal hernias. 6.  Aortic Atherosclerosis (ICD10-I70.0). These results were called by telephone at the time of interpretation on 08/25/2021 at 11:24 am to provider [facility] , who verbally acknowledged these results. Electronically Signed   By: Markus Daft M.D.   On: 08/25/2021 11:24   CT L-SPINE NO CHARGE  Result Date: 08/25/2021 CLINICAL DATA:  Worsening back and right leg pain. History of metastatic lung cancer. EXAM: CT LUMBAR SPINE WITHOUT CONTRAST TECHNIQUE: Multidetector CT imaging of the lumbar spine was performed without intravenous contrast administration. Multiplanar CT image reconstructions were also generated. RADIATION DOSE REDUCTION: This exam was performed according to the departmental dose-optimization program which includes automated exposure control, adjustment of the mA and/or kV according to patient size and/or use of iterative reconstruction technique. COMPARISON:  CT lumbar myelogram dated August 20, 2021. MRI thoracic and lumbar spine dated Jul 30, 2021. FINDINGS: Segmentation: 5 lumbar type vertebrae. Alignment: Unchanged levoscoliosis. Unchanged mild retrolisthesis at L3-L4. Vertebrae: Small lytic lesions in the T11 and L4 vertebral bodies. No acute fracture. Paraspinal and other soft tissues: Please see separate CT abdomen pelvis report from same day. Incompletely visualized diffuse hepatic metastatic disease. Portacaval and aortocaval lymphadenopathy.  Unchanged lytic lesion in the left iliac bone. Aortoiliac atherosclerotic vascular disease. Disc levels: Multilevel degenerative changes are unchanged since lumbar myelogram 5 days ago, including a right extraforaminal disc protrusion at L4-L5 closely approximating and potentially irritating the right L4 nerve root. IMPRESSION: 1. No acute osseous abnormality. 2. Unchanged bony metastases in the T11 and L4 vertebral bodies and left iliac bone. 3. Incompletely visualized intra-abdominal metastatic disease involving the liver and upper abdominal lymph nodes. 4. Aortic Atherosclerosis (ICD10-I70.0). Electronically Signed   By: Titus Dubin M.D.   On: 08/25/2021 10:56   US Abdomen Limited  Result Date: 08/24/2021 CLINICAL DATA:  Elevated LFTs with right-sided abdominal pain. History of metastatic lung cancer. EXAM: ULTRASOUND ABDOMEN LIMITED RIGHT UPPER QUADRANT COMPARISON:  PET-CT June 22, 2021 FINDINGS: Gallbladder: Biliary sludge with gallbladder calculi measuring up to 3 mm. Wall thickening measuring up to 8 mm with some pericholecystic fluid. No sonographic Murphy sign noted by sonographer. Common bile duct: Diameter: 9 mm Liver: Heterogeneous appearance of the hepatic parenchyma with multiple small hepatic lesions identified measuring up to 1.6 cm.  Portal vein is patent on color Doppler imaging with normal direction of blood flow towards the liver. Other: None. IMPRESSION: 1. Heterogeneous appearance of the hepatic parenchyma with multiple small hepatic lesions identified, suspicious for metastatic disease. 2. Cholelithiasis and sludge with gallbladder wall thickening measuring up to 8 mm but with a negative sonographic Murphy sign. Findings which are favored to reflect sequela of hepatic disease. If continued clinical concern for cholecystitis recommend further evaluation with nuclear medicine HIDA scan. 3. Prominence of the common bile duct measuring 9 mm, recommend correlation with laboratory values to  assess for biliary obstruction. Electronically Signed   By: Dahlia Bailiff M.D.   On: 08/24/2021 15:31   DG MYELOGRAPHY LUMBAR INJ LUMBOSACRAL  Result Date: 08/20/2021 CLINICAL DATA:  Disc displacement, lumbar. Right lower extremity radiculopathy extending to lateral anterior thigh without relief from L4-5 epidural steroid injection. EXAM: LUMBAR MYELOGRAM FLUOROSCOPY: Radiation Exposure Index (as provided by the fluoroscopic device): Dose area product 716.04 uGy*m2 PROCEDURE: After thorough discussion of risks and benefits of the procedure including bleeding, infection, injury to nerves, blood vessels, adjacent structures as well as headache and CSF leak, written and oral informed consent was obtained. Consent was obtained by Dr. San Morelle. Time out form was completed. Patient was positioned prone on the fluoroscopy table. Local anesthesia was provided with 1% lidocaine without epinephrine after prepped and draped in the usual sterile fashion. Puncture was performed at L2-3 using a 3 1/2 inch 22-gauge spinal needle via left paramidline approach. Using a single pass through the dura, the needle was placed within the thecal sac, with return of clear CSF. 15 mL of Isovue M-200 was injected into the thecal sac, with normal opacification of the nerve roots and cauda equina consistent with free flow within the subarachnoid space. I personally performed the lumbar puncture and administered the intrathecal contrast. I also personally supervised acquisition of the myelogram images. TECHNIQUE: Contiguous axial images were obtained through the Lumbar spine after the intrathecal infusion of infusion. Coronal and sagittal reconstructions were obtained of the axial image sets. COMPARISON:  MRI of the lumbar spine without contrast 07/30/2021 FINDINGS: LUMBAR MYELOGRAM FINDINGS: Five non rib-bearing lumbar type vertebral bodies are present. Subtle changes are noted at the known L4 lesion. Vertebral body heights are  maintained. Slight retrolisthesis is present at L3-4. This is slightly worse with extension. No other significant listhesis is present. Atherosclerotic calcifications are present in the aorta without aneurysm. Broad-based disc protrusion is present at L3-4. This results in moderate central canal stenosis subarticular narrowing bilaterally with right subarticular narrowing. Right subarticular narrowing is also present at L4-5 secondary to a broad-based disc protrusion. The nerve roots otherwise fill normally on both sides. CT LUMBAR MYELOGRAM FINDINGS: Lumbar spine is imaged from T12 through S2. Vertebral body heights are maintained. Lucent lesion anteriorly at L4 measures 9 mm cephalo caudad, compatible with the suspected metastasis. Multiple lucent lesions are again noted within the left iliac bone. There is destruction of the medial cortex. Atherosclerotic calcifications are present within the aorta and branch vessels. Visualized abdomen is otherwise unremarkable. No significant retroperitoneal adenopathy is present. Mild leftward curvature of the lumbar spine is centered at L3-4. T12-L1: Negative. L1-2: Mild facet hypertrophy is present bilaterally. No significant stenosis is present. L2-3: Mild facet hypertrophy is present. No significant disc protrusion or stenosis is present. L3-4: A broad-based disc protrusion is asymmetric to the right. A left-sided synovial cyst creates some mass effect on the posterior canal. Right greater than left subarticular narrowing  is present. Moderate foraminal narrowing is worse right than left. L4-5: A broad-based disc protrusion is again seen. Left laminectomy noted. The canal is decompressed posteriorly on the left. Facet spurring and disc protrusions result in moderate foraminal stenosis, right greater than left. Mild subarticular narrowing is worse right than left. L5-S1: A shallow central disc protrusion is present without significant stenosis. IMPRESSION: 1. Left  laminectomy at L4-5 with decompression of the canal posteriorly on the left. 2. Moderate foraminal stenosis bilaterally at L4-5 is worse on the right. 3. Mild subarticular narrowing at L4-5 is worse right than left. 4. Moderate central canal stenosis at L3-4 with right greater than left subarticular narrowing. 5. Mild retrolisthesis at L3-4 is slightly exacerbated with extension. 6. Moderate foraminal narrowing bilaterally at L3-4 is worse on the right. 7. Suspected metastases at L4 and in the left iliac bone. 8. Mild facet hypertrophy at L1-2 and L2-3 without significant stenosis. 9. Aortic Atherosclerosis (ICD10-I70.0). Electronically Signed   By: San Morelle M.D.   On: 08/20/2021 12:13   CT LUMBAR SPINE W CONTRAST  Result Date: 08/20/2021 CLINICAL DATA:  Disc displacement, lumbar. Right lower extremity radiculopathy extending to lateral anterior thigh without relief from L4-5 epidural steroid injection. EXAM: LUMBAR MYELOGRAM FLUOROSCOPY: Radiation Exposure Index (as provided by the fluoroscopic device): Dose area product 716.04 uGy*m2 PROCEDURE: After thorough discussion of risks and benefits of the procedure including bleeding, infection, injury to nerves, blood vessels, adjacent structures as well as headache and CSF leak, written and oral informed consent was obtained. Consent was obtained by Dr. San Morelle. Time out form was completed. Patient was positioned prone on the fluoroscopy table. Local anesthesia was provided with 1% lidocaine without epinephrine after prepped and draped in the usual sterile fashion. Puncture was performed at L2-3 using a 3 1/2 inch 22-gauge spinal needle via left paramidline approach. Using a single pass through the dura, the needle was placed within the thecal sac, with return of clear CSF. 15 mL of Isovue M-200 was injected into the thecal sac, with normal opacification of the nerve roots and cauda equina consistent with free flow within the subarachnoid  space. I personally performed the lumbar puncture and administered the intrathecal contrast. I also personally supervised acquisition of the myelogram images. TECHNIQUE: Contiguous axial images were obtained through the Lumbar spine after the intrathecal infusion of infusion. Coronal and sagittal reconstructions were obtained of the axial image sets. COMPARISON:  MRI of the lumbar spine without contrast 07/30/2021 FINDINGS: LUMBAR MYELOGRAM FINDINGS: Five non rib-bearing lumbar type vertebral bodies are present. Subtle changes are noted at the known L4 lesion. Vertebral body heights are maintained. Slight retrolisthesis is present at L3-4. This is slightly worse with extension. No other significant listhesis is present. Atherosclerotic calcifications are present in the aorta without aneurysm. Broad-based disc protrusion is present at L3-4. This results in moderate central canal stenosis subarticular narrowing bilaterally with right subarticular narrowing. Right subarticular narrowing is also present at L4-5 secondary to a broad-based disc protrusion. The nerve roots otherwise fill normally on both sides. CT LUMBAR MYELOGRAM FINDINGS: Lumbar spine is imaged from T12 through S2. Vertebral body heights are maintained. Lucent lesion anteriorly at L4 measures 9 mm cephalo caudad, compatible with the suspected metastasis. Multiple lucent lesions are again noted within the left iliac bone. There is destruction of the medial cortex. Atherosclerotic calcifications are present within the aorta and branch vessels. Visualized abdomen is otherwise unremarkable. No significant retroperitoneal adenopathy is present. Mild leftward curvature of the lumbar  spine is centered at L3-4. T12-L1: Negative. L1-2: Mild facet hypertrophy is present bilaterally. No significant stenosis is present. L2-3: Mild facet hypertrophy is present. No significant disc protrusion or stenosis is present. L3-4: A broad-based disc protrusion is asymmetric to  the right. A left-sided synovial cyst creates some mass effect on the posterior canal. Right greater than left subarticular narrowing is present. Moderate foraminal narrowing is worse right than left. L4-5: A broad-based disc protrusion is again seen. Left laminectomy noted. The canal is decompressed posteriorly on the left. Facet spurring and disc protrusions result in moderate foraminal stenosis, right greater than left. Mild subarticular narrowing is worse right than left. L5-S1: A shallow central disc protrusion is present without significant stenosis. IMPRESSION: 1. Left laminectomy at L4-5 with decompression of the canal posteriorly on the left. 2. Moderate foraminal stenosis bilaterally at L4-5 is worse on the right. 3. Mild subarticular narrowing at L4-5 is worse right than left. 4. Moderate central canal stenosis at L3-4 with right greater than left subarticular narrowing. 5. Mild retrolisthesis at L3-4 is slightly exacerbated with extension. 6. Moderate foraminal narrowing bilaterally at L3-4 is worse on the right. 7. Suspected metastases at L4 and in the left iliac bone. 8. Mild facet hypertrophy at L1-2 and L2-3 without significant stenosis. 9. Aortic Atherosclerosis (ICD10-I70.0). Electronically Signed   By: San Morelle M.D.   On: 08/20/2021 12:13    Labs:  CBC: Recent Labs    08/26/21 0532 08/27/21 0231 08/28/21 0601 08/29/21 0616  WBC 9.9 10.0 13.8* 12.2*  HGB 10.3* 11.1* 10.5* 10.4*  HCT 32.1* 34.2* 32.7* 32.7*  PLT 523* 556* 446* 468*    COAGS: Recent Labs    08/25/21 0816  INR 1.6*  APTT 38*    BMP: Recent Labs    08/26/21 0532 08/27/21 0231 08/28/21 0601 08/29/21 0616  NA 136 134* 137 138  K 3.3* 4.4 4.2 4.4  CL 107 106 108 107  CO2 19* 21* 21* 23  GLUCOSE 127* 236* 210* 190*  BUN 7 9 8 7   CALCIUM 6.9* 8.1* 8.4* 8.6*  CREATININE 0.49* 0.52* 0.52* 0.51*  GFRNONAA >60 >60 >60 >60    LIVER FUNCTION TESTS: Recent Labs    08/25/21 0812  08/26/21 0532 08/28/21 0601 08/29/21 0616  BILITOT 0.9 0.6 0.5 0.4  AST 24 25 55* 63*  ALT 25 22 43 52*  ALKPHOS 710* 644* 904* 858*  PROT 6.4* 5.3* 5.9* 6.0*  ALBUMIN 2.7* 2.2* 2.5* 2.6*    TUMOR MARKERS: No results for input(s): "AFPTM", "CEA", "CA199", "CHROMGRNA" in the last 8760 hours.  Assessment and Plan: 61 y.o. male with intractable pain mostly low back pain due to cancer and mets.   IR was requested for RFA to L4 or L5 nerve root. Case was reviewed and approved by Dr. [name], tentatively scheduled for Monday 08/30/21 pending IR schedule.   VSS CBC stable leukocytosis but on Decadron  INR 1.6 on 08/25/21   Risks and benefits of lumbar nerve root block  were discussed with the patient including, but not limited to bleeding, infection, spinal cord damage, paralysis, or even death.  All of the patient's questions were answered, patient is agreeable to proceed.  Consent signed and in chart.  PLAN - NPO Mon MN - Currently on heparin gtt, order placed to be held at Monday 4 am - needs to be held at least 4 hours  - Discussed with attending regarding positive blood cx, most likely contamination, no concern for bacteremia  Thank you for this interesting consult.  I greatly enjoyed meeting Jarett Dralle and look forward to participating in their care.  A copy of this report was sent to the requesting provider on this date.  Electronically Signed: Tera Mater, PA-C 08/29/2021, 10:13 AM   I spent a total of 40 Minutes    in face to face in clinical consultation, greater than 50% of which was counseling/coordinating care for nerve root block.  This chart was dictated using voice recognition software.  Despite best efforts to proofread,  errors can occur which can change the documentation meaning.

## 2021-08-30 ENCOUNTER — Other Ambulatory Visit (HOSPITAL_COMMUNITY): Payer: Self-pay

## 2021-08-30 ENCOUNTER — Inpatient Hospital Stay (HOSPITAL_COMMUNITY): Payer: Medicare HMO

## 2021-08-30 DIAGNOSIS — G893 Neoplasm related pain (acute) (chronic): Secondary | ICD-10-CM | POA: Diagnosis not present

## 2021-08-30 DIAGNOSIS — C799 Secondary malignant neoplasm of unspecified site: Secondary | ICD-10-CM

## 2021-08-30 DIAGNOSIS — R530 Neoplastic (malignant) related fatigue: Secondary | ICD-10-CM

## 2021-08-30 DIAGNOSIS — C3490 Malignant neoplasm of unspecified part of unspecified bronchus or lung: Secondary | ICD-10-CM

## 2021-08-30 DIAGNOSIS — R52 Pain, unspecified: Secondary | ICD-10-CM | POA: Diagnosis not present

## 2021-08-30 DIAGNOSIS — Z515 Encounter for palliative care: Secondary | ICD-10-CM | POA: Diagnosis not present

## 2021-08-30 DIAGNOSIS — F419 Anxiety disorder, unspecified: Secondary | ICD-10-CM

## 2021-08-30 HISTORY — PX: IR FLUORO GUIDED NEEDLE PLC ASPIRATION/INJECTION LOC: IMG2395

## 2021-08-30 LAB — COMPREHENSIVE METABOLIC PANEL
ALT: 48 U/L — ABNORMAL HIGH (ref 0–44)
AST: 49 U/L — ABNORMAL HIGH (ref 15–41)
Albumin: 2.7 g/dL — ABNORMAL LOW (ref 3.5–5.0)
Alkaline Phosphatase: 746 U/L — ABNORMAL HIGH (ref 38–126)
Anion gap: 11 (ref 5–15)
BUN: 7 mg/dL (ref 6–20)
CO2: 24 mmol/L (ref 22–32)
Calcium: 8.7 mg/dL — ABNORMAL LOW (ref 8.9–10.3)
Chloride: 101 mmol/L (ref 98–111)
Creatinine, Ser: 0.52 mg/dL — ABNORMAL LOW (ref 0.61–1.24)
GFR, Estimated: >60 mL/min (ref >60)
Glucose, Bld: 172 mg/dL — ABNORMAL HIGH (ref 70–99)
Potassium: 4.2 mmol/L (ref 3.5–5.1)
Sodium: 136 mmol/L (ref 135–145)
Total Bilirubin: 1.1 mg/dL (ref 0.3–1.2)
Total Protein: 6.1 g/dL — ABNORMAL LOW (ref 6.5–8.1)

## 2021-08-30 LAB — BLOOD GAS, VENOUS
Acid-base deficit: 0.6 mmol/L (ref 0.0–2.0)
Bicarbonate: 23.2 mmol/L (ref 20.0–28.0)
O2 Saturation: 98.6 %
Patient temperature: 36.4
pCO2, Ven: 34 mmHg — ABNORMAL LOW (ref 44–60)
pH, Ven: 7.44 — ABNORMAL HIGH (ref 7.25–7.43)
pO2, Ven: 71 mmHg — ABNORMAL HIGH (ref 32–45)

## 2021-08-30 LAB — CBC
HCT: 32.9 % — ABNORMAL LOW (ref 39.0–52.0)
Hemoglobin: 10.6 g/dL — ABNORMAL LOW (ref 13.0–17.0)
MCH: 30.5 pg (ref 26.0–34.0)
MCHC: 32.2 g/dL (ref 30.0–36.0)
MCV: 94.8 fL (ref 80.0–100.0)
Platelets: 475 10*3/uL — ABNORMAL HIGH (ref 150–400)
RBC: 3.47 MIL/uL — ABNORMAL LOW (ref 4.22–5.81)
RDW: 15.4 % (ref 11.5–15.5)
WBC: 10.7 10*3/uL — ABNORMAL HIGH (ref 4.0–10.5)
nRBC: 0 % (ref 0.0–0.2)

## 2021-08-30 LAB — CULTURE, BLOOD (ROUTINE X 2)
Culture: NO GROWTH
Special Requests: ADEQUATE

## 2021-08-30 LAB — GLUCOSE, CAPILLARY
Glucose-Capillary: 148 mg/dL — ABNORMAL HIGH (ref 70–99)
Glucose-Capillary: 104 mg/dL — ABNORMAL HIGH (ref 70–99)
Glucose-Capillary: 141 mg/dL — ABNORMAL HIGH (ref 70–99)
Glucose-Capillary: 119 mg/dL — ABNORMAL HIGH (ref 70–99)

## 2021-08-30 LAB — HEPARIN LEVEL (UNFRACTIONATED): Heparin Unfractionated: 0.29 IU/mL — ABNORMAL LOW (ref 0.30–0.70)

## 2021-08-30 IMAGING — XA Imaging study
1 series · 4 of 4 positions shown · non-contrast
Comparison: none

CLINICAL DATA: 60-year-old male with L4-L5 extraforaminal disc
extrusion contacting the right L4 nerve root and associated with
right L4 radiculopathy. In-patient nerve root block for symptom
relief.

[Series 1: processed: ir epidural inj lumbar sacral · 4 of 33 frames shown]
[frame 5/33]
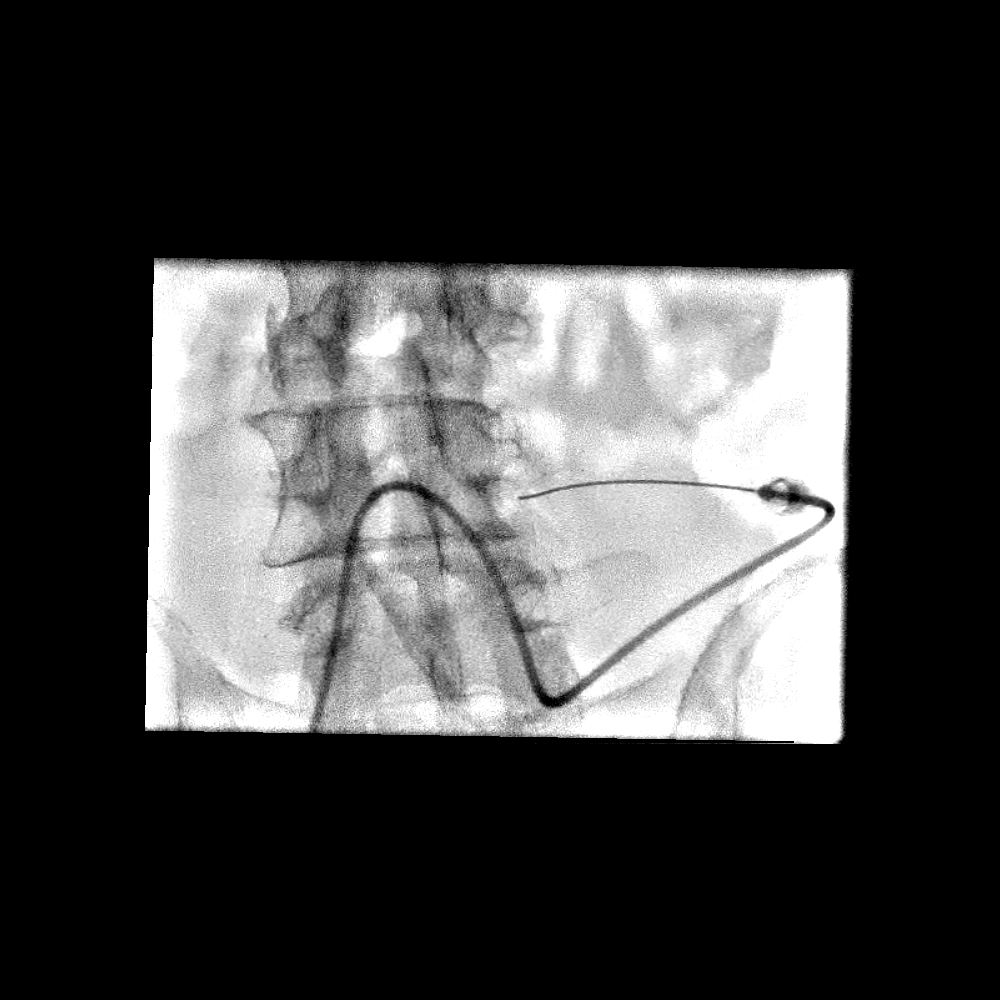
[frame 17/33]
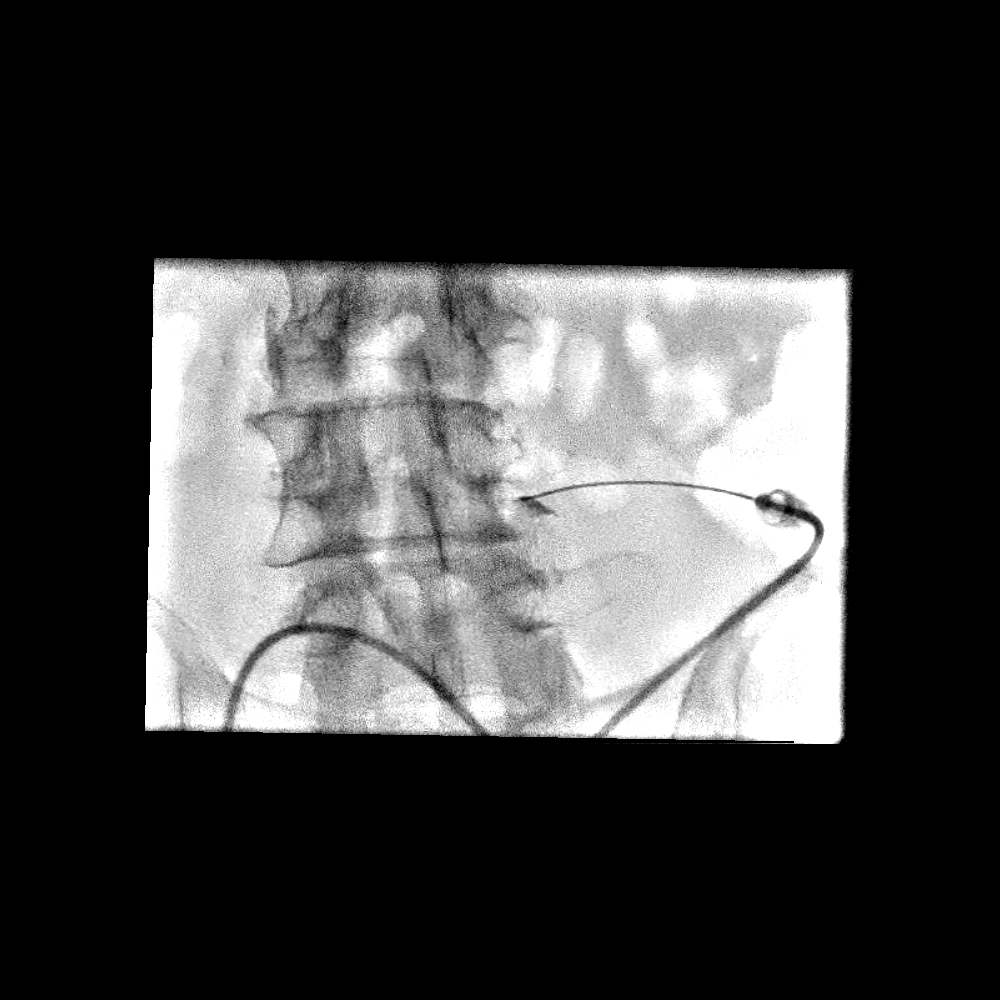
[frame 20/33]
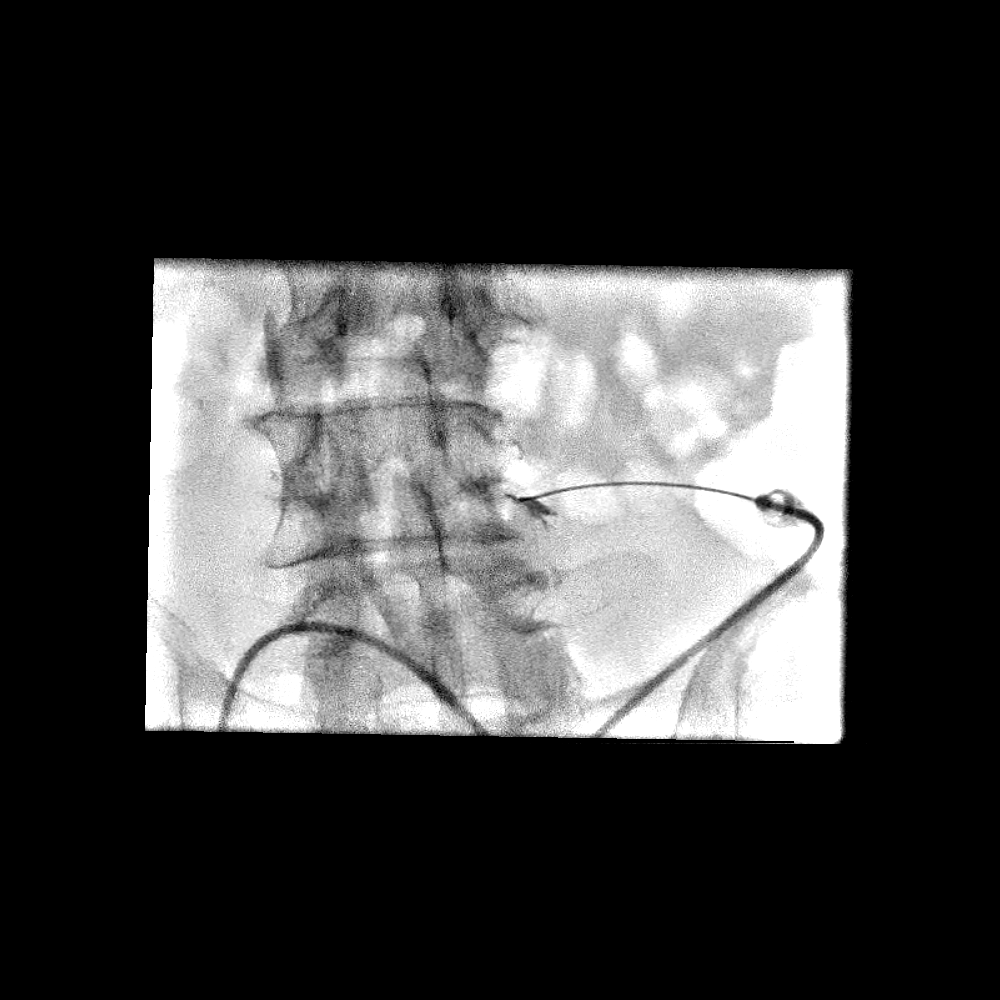
[frame 29/33]
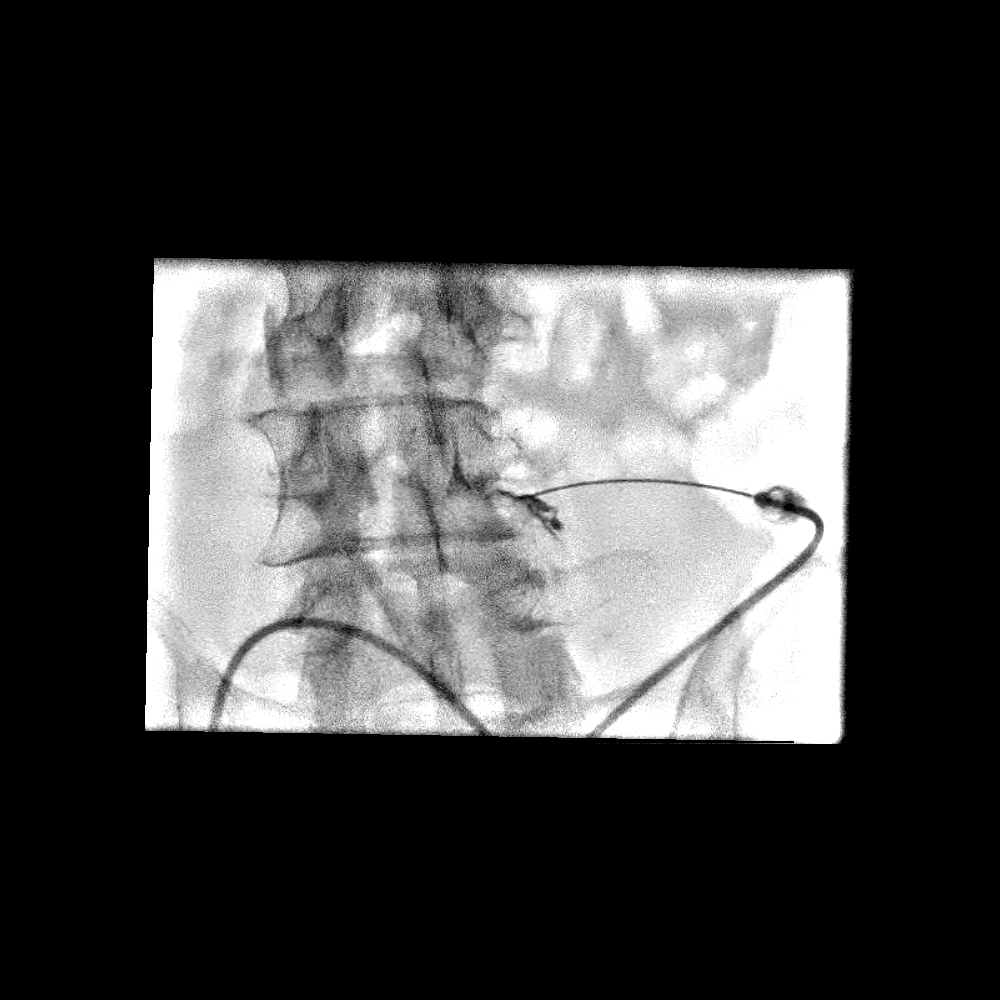

[4 of 4 positions shown; findings below may reference images not displayed]

EXAM:
IR INJECT/CHULA/INC NEEDLE/CHULA/CHULA EPI/CHULA/SAC W/IMG

FLUOROSCOPY TIME:  Radiation exposure index: 14 mGy

PROCEDURE:
The procedure, risks, benefits, and alternatives were explained to
the patient. Questions regarding the procedure were encouraged and
answered. The patient understands and consents to the procedure.

RIGHT L4 NERVE ROOT BLOCK AND TRANSFORAMINAL EPIDURAL: A posterior
oblique approach was taken to the intervertebral foramen on the
right at L4 using a curved 22 gauge spinal needle. Injection of
Omnipaque 180 outlined the L4 nerve root and showed good epidural
spread. No vascular opacification is seen. 80 mg of Depo-Medrol
mixed with 2 mL 1% lidocaine were instilled. The procedure was
well-tolerated, and the patient was discharged thirty minutes
following the injection in good condition.

COMPLICATIONS:
None
IMPRESSION: Technically successful injection consisting of a right L4 nerve root
block and transforaminal epidural.

## 2021-08-30 MED ORDER — LORAZEPAM 2 MG/ML IJ SOLN
0.5000 mg | Freq: Once | INTRAMUSCULAR | Status: AC
  Administered 2021-08-30: 0.5 mg via INTRAVENOUS
  Filled 2021-08-30: qty 1

## 2021-08-30 MED ORDER — BUPIVACAINE HCL (PF) 0.5 % IJ SOLN
INTRAMUSCULAR | Status: AC
  Filled 2021-08-30: qty 30

## 2021-08-30 MED ORDER — METHYLPREDNISOLONE ACETATE 40 MG/ML IJ SUSP
INTRAMUSCULAR | Status: AC
  Filled 2021-08-30: qty 1

## 2021-08-30 MED ORDER — IOPAMIDOL (ISOVUE-M 200) INJECTION 41%
INTRAMUSCULAR | Status: AC
  Filled 2021-08-30: qty 10

## 2021-08-30 MED ORDER — CYCLOBENZAPRINE HCL 5 MG PO TABS
5.0000 mg | ORAL_TABLET | Freq: Three times a day (TID) | ORAL | Status: DC | PRN
  Administered 2021-08-31 – 2021-09-03 (×3): 5 mg via ORAL
  Filled 2021-08-30 (×3): qty 1

## 2021-08-30 MED ORDER — IOPAMIDOL (ISOVUE-M 200) INJECTION 41%
INTRAMUSCULAR | Status: DC | PRN
  Administered 2021-08-30: 1 mL via EPIDURAL

## 2021-08-30 MED ORDER — METHYLPREDNISOLONE ACETATE 40 MG/ML INJ SUSP (RADIOLOG
INTRAMUSCULAR | Status: DC | PRN
  Administered 2021-08-30: 120 mg via EPIDURAL

## 2021-08-30 MED ORDER — HYDRALAZINE HCL 20 MG/ML IJ SOLN
10.0000 mg | Freq: Once | INTRAMUSCULAR | Status: AC
  Administered 2021-09-01: 10 mg via INTRAVENOUS
  Filled 2021-08-30: qty 1

## 2021-08-30 MED ORDER — LIDOCAINE HCL (PF) 1 % IJ SOLN
INTRAMUSCULAR | Status: DC | PRN
  Administered 2021-08-30: 2 mL

## 2021-08-30 MED ORDER — METHYLPREDNISOLONE ACETATE 80 MG/ML IJ SUSP
INTRAMUSCULAR | Status: AC
  Filled 2021-08-30: qty 1

## 2021-08-30 MED ORDER — LORAZEPAM 1 MG PO TABS
1.0000 mg | ORAL_TABLET | Freq: Three times a day (TID) | ORAL | Status: DC | PRN
  Administered 2021-08-30 – 2021-09-02 (×5): 1 mg via ORAL
  Filled 2021-08-30 (×5): qty 1

## 2021-08-30 MED ORDER — LIDOCAINE HCL (PF) 1 % IJ SOLN
INTRAMUSCULAR | Status: AC
  Filled 2021-08-30: qty 30

## 2021-08-30 NOTE — Care Management Important Message (Signed)
Important Message  Patient Details IM Letter placed in Patients room. Name: Bill Garza MRN: [id number] Date of Birth: [birth year]   Medicare Important Message Given:  Yes     [name] 08/30/2021, 10:07 AM

## 2021-08-30 NOTE — Progress Notes (Signed)
Apparently, there are some issues with breathing yesterday.  He had a chest x-ray which showed some atelectasis.  He may have had a little bit of fluid in the lung.  The IV fluid has been stopped.  The Lyrica has also been discontinued.  I never heard of Lyrica causing fluid retention but apparently this is happened before with him.  He should hopefully have the nerve block today.  He definitely needs to have this done.  The whole point of him being in the hospital is for his back pain and this is the first step in order to try to alleviate this.  He does have anxiety.  I know that he does get nervous over issues that pop up.  His labs show white count 10.7.  Hemoglobin 10.6.  Platelet count 4 75,000.  His albumin is 2.7.  Calcium 8.7.  BUN 7 creatinine 0.52.  His sodium 136 and potassium 4.2.  He seemed to be eating okay over the weekend.  There is no nausea or vomiting.  Again, our goal is to help with his back.  Nerve block is the best way to start.  If the nerve block helps, then we can see about RFA to the spinal nerves.  He is on the heparin for the portal vein thrombus.  I would keep him on heparin until we know that any further invasive procedures are going to be done with.  He may need to have a Port-A-Cath placed it would not do any kind of chemotherapy.  I talked him about this a little bit this morning.  We will readdress this after he has the nerve block.  I know that he is trying hard.  He is using his faith.  He and I had a very good prayer this morning.  I just hope that he can have this nerve block and that we will work.  I know that [facility] does a fantastic job with all of our patients.  I know that they will do their best to help him out.  I also want to thank the great staff on [address] for all of their efforts.  Lattie Haw, MD  Darlyn Chamber 17:14

## 2021-08-30 NOTE — Progress Notes (Signed)
Pt requested to keep a cup of water at bedside despite repeated education on importance of following npo diet measures. Plan of care ongoing.

## 2021-08-30 NOTE — Progress Notes (Signed)
Patient and his son stated that patient cannot take any PO meds right now and refused some ordered PO medications. RN held these medication and explain why he needs medication. Patient and his son understood but still wanted RN to hold his PO medications now.

## 2021-08-30 NOTE — Progress Notes (Signed)
Pharmacy: Re- Heparin  Per Rowe Robert, ok to resume heparin drip back now s/p nerve block procedure.  Plan: - Since level this morning was at the lower end of the therapeutic range, will increase heparin drip up slightly to 1650 units/hr - daily heparin level. F/u with AM labs and adjust as needed  Dia Sitter, PharmD, BCPS 08/30/2021 3:19 PM

## 2021-08-30 NOTE — Progress Notes (Signed)
Family called this nurse to patient room and stated that patient wasn't breathing right and his mentation was off. Upon my assessment of patient, patient is having regular, unlabored, respirations. Chest is rising and falling equally. Patient is able to answer all of my questions as to his name, DOB, where he is at, the month and year. Patient continues to be drowsy as he has been during my shift. Patient didn't rest well last night per report from the night shift RN. Patient had nerve block procedure done today. Called [facility], notified Dr. Nevada Crane and Ardelle Park, NP with palliative care. [facility] Debroah came to patient bedside and assessed patient. Dr. Rowe Pavy with the palliative care team came to patient bedside. Dr. Nevada Crane also came to patient bedside and assessed patient.  New order for VBG.

## 2021-08-30 NOTE — Plan of Care (Signed)

## 2021-08-30 NOTE — Progress Notes (Signed)
PROGRESS NOTE  Bill Garza LFY:[id number] DOB: [birth year] DOA: 08/25/2021 PCP: Aleen Campi, NP  HPI/Recap of past 24 hours: 61 y.o. 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 Presenting with intractable back pain and abdominal pain-  imaging w/ new portal vein thrombosis-started on anticoagulation after discussing with [name], palliative care and hematology consulted and following.  Appreciate consultants assistance.  On admission: MR lumbar thoracic spine shows-new and progressive thoracic and lumbosacral osseous metastatic disease without significant extraosseous extension or pathologic fracture, enlarging bilateral iliac bone and upper abdominal nodal metastatic disease, unchanged multilevel lumbar spondylosis.    Due to intractable pain not improved with as needed IV opioids, the patient was started on PCA Dilaudid pump.  08/30/21: Post right L4 nerve root block and transforaminal epidural.  Assessment/Plan: Principal Problem:   Intractable pain Active Problems:   Non-small cell lung cancer metastatic to brain [facility])   Secondary malignant neoplasm of bone and bone marrow (HCC)   DM2 (diabetes mellitus, type 2) (HCC)   Depression   COPD (chronic obstructive pulmonary disease) (HCC)   Tobacco abuse   Elevated alkaline phosphatase level   SIRS (systemic inflammatory response syndrome) (HCC)   Normocytic anemia   Hyponatremia   Anxiety   Generalized pain   General weakness   Constipation  Intractable pain mostly low back pain due to cancer and mets 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 has consulted IR to consider RFA to L4 or L5 nerve root.   Palliative care also consult IR to look for iliac bone freezing.  Liver MRI shows innumerable more than 30  liver masses scattered throughout new since 3/31, several enhancing osseous lesions throughout the visualized skeleton increased since 3/31, mild to moderate intrahepatic biliary duct dilatation, no choledocholithiasis, small gallbladder sludge PCA Dilaudid pump, methadone, Flexeril, IV Decadron and Lyrica. Lyrica DC'd on 08/29/2021 per patient and family request. Post right L4 nerve root block and transforaminal epidural.   Portal vein thrombosis:  Continue heparin drip and switch to DOAC closer to discharge.  On admission neurosurgery team Dr. Salomon Fick was consulted about anticoagulation and there is no contraindication to [facility] at this point w/ respect to his brain.  Elevated liver chemistries Continue to closely monitor Continue to avoid hepatotoxic agents as possible  Bilateral lower extremity edema likely contributed by hypoalbuminemia Albumin 2.6 Elevate lower extremities  Resolved non-anion gap metabolic acidosis Serum bicarb 23 from 19 and anion gap of 8 Continue to encourage oral intake   DM2 with hyperglycemia:  A1c 7.1 blood sugar poorly controlled, likely in the setting of patient's Decadron increase sliding scale increase to 0-15 unit Continue to hold metformin   Chronic anxiety/depression:  Continue home regimen.   COPD Tobacco abuse: Not in exacerbation continue home inhalers-Dulera, Singulair   Elevated alkaline phosphatase level:  Likely from bone metastasis    Resolved SIRS On admission mild tachycardia, leukocytosis 13.3.  No obvious infection source noted patient had CT chest abdomen pelvis leukocytosis has resolved likely reactive.  UA unremarkable with negative nitrate and leukocytes.  Holding off on antibiotics.   Afebrile, continue to monitor Of IV fluid on 08/29/2021 due to bilateral lower extremity edema.   Normocytic anemia Hemoglobin stable 10.4 from 10.5. No overt bleeding  Resolved hyponatremia:  Serum sodium 138.  Goals of care:  Oncology has discussed extensively patient has an incurable malignancy continues to desire full code.  DNR has been recommended, palliative care on board. Cont pain control as #1   DVT prophylaxis: SCDs Start: 08/25/21 1527, heparin drip. Code Status:   Code Status: Full Code Family Communication: Plan of care discussed with the patient's wife via phone. Patient status is: Inpatient because of ongoing need for IV pain management with intractable pain Level of care: Telemetry    Dispo: The patient is from: Home            Anticipated disposition: Pending pain control.        Status is: Inpatient The patient requires at least 2 midnights for further evaluation and treatment of present condition.    Objective: Vitals:   08/30/21 1009 08/30/21 1025 08/30/21 1356 08/30/21 1525  BP: (!) 156/91  (!) 153/93   Pulse: 90  90   Resp: 20 17 15 17   Temp: 98.4 F (36.9 C)  98 F (36.7 C)   TempSrc: Oral     SpO2: 98% 97% 99% 96%  Weight:      Height:        Intake/Output Summary (Last 24 hours) at 08/30/2021 1733 Last data filed at 08/30/2021 4665 Gross per 24 hour  Intake 120 ml  Output 1000 ml  Net -880 ml   Filed Weights   08/25/21 0809  Weight: 72.1 kg    Exam: No significant changes  General: 61 y.o. year-old male frail-appearing in no acute distress.  He is alert and oriented x3.   Cardiovascular: Regular rate and rhythm no rubs or gallops. Respiratory: Clear to station no wheezes or rales.   Abdomen: Soft noted normal bowel sounds present. Musculoskeletal: 1+ pitting edema in lower extremities bilaterally.   Skin: No ulcerative lesions noted. Psychiatry: Mood is appropriate for condition and setting.   Data Reviewed: CBC: Recent Labs  Lab 08/25/21 0812 08/26/21 0532 08/27/21 0231 08/28/21 0601 08/29/21 0616 08/30/21 0519  WBC 12.8* 9.9 10.0 13.8* 12.2* 10.7*  NEUTROABS 10.4*  --   --   --   --   --   HGB 12.0* 10.3* 11.1* 10.5* 10.4* 10.6*  HCT 36.3*  32.1* 34.2* 32.7* 32.7* 32.9*  MCV 91.2 95.5 94.2 94.5 95.3 94.8  PLT 591* 523* 556* 446* 468* 993*   Basic Metabolic Panel: Recent Labs  Lab 08/26/21 0532 08/27/21 0231 08/28/21 0601 08/29/21 0616 08/30/21 0519  NA 136 134* 137 138 136  K 3.3* 4.4 4.2 4.4 4.2  CL 107 106 108 107 101  CO2 19* 21* 21* 23 24  GLUCOSE 127* 236* 210* 190* 172*  BUN 7 9 8 7 7   CREATININE 0.49* 0.52* 0.52* 0.51* 0.52*  CALCIUM 6.9* 8.1* 8.4* 8.6* 8.7*   GFR: Estimated Creatinine Clearance: 102.6 mL/min (A) (by C-G formula based on SCr of 0.52 mg/dL (L)). Liver Function Tests: Recent Labs  Lab 08/25/21 0812 08/26/21 0532 08/28/21 0601 08/29/21 0616 08/30/21 0519  AST 24 25 55* 63* 49*  ALT 25 22 43 52* 48*  ALKPHOS 710* 644* 904* 858* 746*  BILITOT 0.9 0.6 0.5 0.4 1.1  PROT 6.4* 5.3* 5.9* 6.0* 6.1*  ALBUMIN 2.7* 2.2* 2.5* 2.6* 2.7*   Recent Labs  Lab 08/25/21 0812  LIPASE 19   No results for input(s): "AMMONIA" in the last 168 hours. Coagulation Profile: Recent Labs  Lab 08/25/21 0816  INR 1.6*   Cardiac Enzymes: No results for input(s): "CKTOTAL", "CKMB", "  CKMBINDEX", "TROPONINI" in the last 168 hours. BNP (last 3 results) No results for input(s): "PROBNP" in the last 8760 hours. HbA1C: No results for input(s): "HGBA1C" in the last 72 hours.  CBG: Recent Labs  Lab 08/29/21 1656 08/29/21 2108 08/30/21 0748 08/30/21 1131 08/30/21 1611  GLUCAP 96 118* 148* 104* 141*   Lipid Profile: No results for input(s): "CHOL", "HDL", "LDLCALC", "TRIG", "CHOLHDL", "LDLDIRECT" in the last 72 hours. Thyroid Function Tests: No results for input(s): "TSH", "T4TOTAL", "FREET4", "T3FREE", "THYROIDAB" in the last 72 hours. Anemia Panel: No results for input(s): "VITAMINB12", "FOLATE", "FERRITIN", "TIBC", "IRON", "RETICCTPCT" in the last 72 hours.  Urine analysis:    Component Value Date/Time   COLORURINE YELLOW 08/25/2021 [place] 08/25/2021 0749   LABSPEC 1.006  08/25/2021 0749   PHURINE 7.0 08/25/2021 0749   GLUCOSEU NEGATIVE 08/25/2021 0749   HGBUR NEGATIVE 08/25/2021 0749   BILIRUBINUR NEGATIVE 08/25/2021 0749   KETONESUR 20 (A) 08/25/2021 0749   PROTEINUR NEGATIVE 08/25/2021 0749   NITRITE NEGATIVE 08/25/2021 0749   LEUKOCYTESUR NEGATIVE 08/25/2021 0749   Sepsis Labs: @LABRCNTIP (procalcitonin:4,lacticidven:4)  ) Recent Results (from the past 240 hour(s))  Culture, blood (Routine X 2) w Reflex to ID Panel     Status: None   Collection Time: 08/25/21  3:01 PM   Specimen: Right Antecubital; Blood  Result Value Ref Range Status   Specimen Description   Final    RIGHT ANTECUBITAL BLOOD Performed at [facility], [address] [place], [place] [address]    Special Requests   Final    Blood Culture adequate volume BOTTLES DRAWN AEROBIC AND ANAEROBIC Performed at [facility], [address] [place], [place] [address]    Culture   Final    NO GROWTH 5 DAYS Performed at [facility], [place] [address] [place], [place] [address]    Report Status 08/30/2021 FINAL  Final  Culture, blood (Routine X 2) w Reflex to ID Panel     Status: Abnormal   Collection Time: 08/25/21  5:30 PM   Specimen: BLOOD  Result Value Ref Range Status   Specimen Description BLOOD SITE NOT SPECIFIED  Final   Special Requests IN PEDIATRIC BOTTLE Blood Culture adequate volume  Final   Culture  Setup Time   Final    GRAM POSITIVE COCCI IN CLUSTERS IN PEDIATRIC BOTTLE CRITICAL RESULT CALLED TO, READ BACK BY AND VERIFIED WITH: L POINDEXTER,[email or web address]  08/26/21 [place]    Culture (A)  Final    STAPHYLOCOCCUS EPIDERMIDIS THE SIGNIFICANCE OF ISOLATING THIS ORGANISM FROM A SINGLE VENIPUNCTURE CANNOT BE PREDICTED WITHOUT FURTHER CLINICAL AND CULTURE CORRELATION. SUSCEPTIBILITIES AVAILABLE ONLY ON REQUEST. Performed at [facility], [place] [address], [place], [place] [address]    Report Status 08/28/2021 FINAL  Final  Blood Culture ID Panel  (Reflexed)     Status: Abnormal   Collection Time: 08/25/21  5:30 PM  Result Value Ref Range Status   Enterococcus faecalis NOT DETECTED NOT DETECTED Final   Enterococcus Faecium NOT DETECTED NOT DETECTED Final   Listeria monocytogenes NOT DETECTED NOT DETECTED Final   Staphylococcus species DETECTED (A) NOT DETECTED Final    Comment: CRITICAL RESULT CALLED TO, READ BACK BY AND VERIFIED WITH: L POINDEXTER,[email or web address]  08/26/21 [place]    Staphylococcus aureus (BCID) NOT DETECTED NOT DETECTED Final   Staphylococcus epidermidis DETECTED (A) NOT DETECTED Final    Comment: CRITICAL RESULT CALLED TO, READ BACK BY AND VERIFIED WITH: L POINDEXTER,[email or web address]   08/26/21 [place]    Staphylococcus lugdunensis NOT DETECTED NOT DETECTED Final   Streptococcus species NOT DETECTED NOT DETECTED Final   Streptococcus agalactiae NOT DETECTED NOT DETECTED Final   Streptococcus pneumoniae NOT DETECTED NOT DETECTED Final   Streptococcus pyogenes NOT DETECTED NOT DETECTED Final   A.calcoaceticus-baumannii NOT DETECTED NOT DETECTED Final   Bacteroides fragilis NOT DETECTED NOT DETECTED Final   Enterobacterales NOT DETECTED NOT DETECTED Final   Enterobacter cloacae complex NOT DETECTED NOT DETECTED Final   Escherichia coli NOT DETECTED NOT DETECTED Final   Klebsiella aerogenes NOT DETECTED NOT DETECTED Final   Klebsiella oxytoca NOT DETECTED NOT DETECTED Final   Klebsiella pneumoniae NOT DETECTED NOT DETECTED Final   Proteus species NOT DETECTED NOT DETECTED Final   Salmonella species NOT DETECTED NOT DETECTED Final   Serratia marcescens NOT DETECTED NOT DETECTED Final   Haemophilus influenzae NOT DETECTED NOT DETECTED Final   Neisseria meningitidis NOT DETECTED NOT DETECTED Final   Pseudomonas aeruginosa NOT DETECTED NOT DETECTED Final   Stenotrophomonas maltophilia NOT DETECTED NOT DETECTED Final   Candida albicans NOT DETECTED NOT DETECTED Final   Candida auris NOT DETECTED NOT DETECTED Final   Candida glabrata  NOT DETECTED NOT DETECTED Final   Candida krusei NOT DETECTED NOT DETECTED Final   Candida parapsilosis NOT DETECTED NOT DETECTED Final   Candida tropicalis NOT DETECTED NOT DETECTED Final   Cryptococcus neoformans/gattii NOT DETECTED NOT DETECTED Final   Methicillin resistance mecA/C NOT DETECTED NOT DETECTED Final    Comment: Performed at [facility], [address], [place], [place] [address]      Studies: IR Fluoro Guide Ndl Plmt / BX  Result Date: 08/30/2021 CLINICAL DATA:  62 year old male with L4-L5 extraforaminal disc extrusion contacting the right L4 nerve root and associated with right L4 radiculopathy. In-patient nerve root block for symptom relief. EXAM: IR INJECT/THERA/INC NEEDLE/CATH/PLC EPI/LUMB/SAC W/IMG FLUOROSCOPY TIME:  Radiation exposure index: 14 mGy PROCEDURE: The procedure, risks, benefits, and alternatives were explained to the patient. Questions regarding the procedure were encouraged and answered. The patient understands and consents to the procedure. RIGHT L4 NERVE ROOT BLOCK AND TRANSFORAMINAL EPIDURAL: A posterior oblique approach was taken to the intervertebral foramen on the right at L4 using a curved 22 gauge spinal needle. Injection of Omnipaque 180 outlined the L4 nerve root and showed good epidural spread. No vascular opacification is seen. 80 mg of Depo-Medrol mixed with 2 mL 1% lidocaine were instilled. The procedure was well-tolerated, and the patient was discharged thirty minutes following the injection in good condition. COMPLICATIONS: None IMPRESSION: Technically successful injection consisting of a right L4 nerve root block and transforaminal epidural. Electronically Signed   By: [name] M.D.   On: 08/30/2021 14:47    Scheduled Meds:  dexamethasone (DECADRON) injection  8 mg Intravenous Q24H   dronabinol  2.5 mg Oral TID AC   escitalopram  5 mg Oral Daily   feeding supplement  237 mL Oral BID BM   hydrALAZINE  10 mg Intravenous Once    HYDROmorphone   Intravenous Q4H   insulin aspart  0-15 Units Subcutaneous TID WC   iopamidol       lactulose  13.3333-20 g Oral BID   LORazepam  0.5 mg Intravenous Once   methadone  10 mg Oral Q8H   metoCLOPramide (REGLAN) injection  5 mg Intravenous Q6H   mometasone-formoterol  2 puff Inhalation BID   montelukast  10 mg Oral QHS   multivitamin with minerals  1 tablet Oral Daily   pantoprazole  40 mg Oral Daily   senna-docusate  2 tablet Oral BID   traZODone  50 mg Oral QHS    Continuous Infusions:  heparin 1,650 Units/hr (08/30/21 1524)     LOS: 4 days     Kayleen Memos, MD [facility] Pager [phone]  If 7PM-7AM, please contact night-coverage [email or web address] Password [facility] 08/30/2021, 5:33 PM

## 2021-08-30 NOTE — TOC Benefit Eligibility Note (Signed)
Patient Research scientist (life sciences) completed.     The patient is currently admitted and upon discharge could be taking ELIQUIS 5 MG.   The current 30 day co-pay is, $10.35.   The patient is currently admitted and upon discharge could be taking XARELTO 20 MG.   The current 30 day co-pay is, $10.35.   The patient is insured through [employer].

## 2021-08-30 NOTE — Progress Notes (Signed)
[place] for heparin Indication:  VTE treatment  (portal vein thrombosis)  Allergies  Allergen Reactions   Gabapentin Swelling    Leg swelling    Levetiracetam Other (See Comments)    Personality changes   Pregabalin Swelling    Leg swelling    Bupropion Nausea And Vomiting   Duloxetine Other (See Comments)    Excessive sleeping   Ezetimibe Other (See Comments)    Unknown reaction   Other Other (See Comments)    Antibiotics - per wife at bedside, patient will develop C-diff if given "any antibiotic"   Prochlorperazine Nausea And Vomiting   Lamotrigine Itching and Rash    Patient Measurements: Height: 6' (182.9 cm) Weight: 72.1 kg (159 lb) IBW/kg (Calculated) : 77.6 Heparin Dosing Weight: 72.1 kg  Vital Signs: Temp: 97.8 F (36.6 C) (06/12 0402) Temp Source: Oral (06/12 0402) BP: 158/85 (06/12 0528) Pulse Rate: 92 (06/12 0528)  Labs: Recent Labs    08/28/21 0601 08/29/21 0616 08/29/21 1036 08/30/21 0519  HGB 10.5* 10.4*  --  10.6*  HCT 32.7* 32.7*  --  32.9*  PLT 446* 468*  --  475*  HEPARINUNFRC 0.50 0.22* 0.34 0.29*  CREATININE 0.52* 0.51*  --  0.52*     Estimated Creatinine Clearance: 102.6 mL/min (A) (by C-G formula based on SCr of 0.52 mg/dL (L)).   Assessment: 62 year old male with hx NSCLC with mets to brain/liver/spine/hip and craniotomy in April 2023 at [facility] who presented to the ED on 08/25/21 with c/o abdominal pain. CT abdomen/pelvis showed right portal vein thrombosis. TRH spoke with neurosurgery team and they stated no contraindication for anticoagulation from their standpoint. He's currently on heparin drip for VTE treatment.  No anticoagulation noted PTA.   Today, 08/30/2021: - heparin level collected at 0519 this morning was slightly subtherapeutic at 0.29. Of note, heparin drip turned off at 0515 (a little bit before level was collected) in anticipation for lumbar nerve root block procedure  -  cbc stable - no bleeding documented   Goal of Therapy:  Heparin level 0.3-0.7 units/ml Monitor platelets by anticoagulation protocol: Yes   Plan:  - heparin turned off this morning at ~0515 in anticipation for lumbar nerve root block -  Please advise if/when drip can be resumed back for pt after procedure - Daily CBC, heparin level  - Monitor closely for s/sx of bleeding  Dia Sitter, PharmD, BCPS 08/30/2021 8:32 AM

## 2021-08-30 NOTE — Progress Notes (Addendum)
Daily Progress Note   Patient Name: Bill Garza       Date: 08/30/2021 DOB: [birth year]  Age: 61 y.o. MRN#: [id number] Attending Physician: Kayleen Memos, DO Primary Care Physician: Aleen Campi, NP Admit Date: 08/25/2021  Reason for Consultation/Follow-up: Pain control  Subjective: Bill Garza is resting in bed. Easily awakened. Oriented. Appears comfortable. Reports pain is controlled on PCA. Is s/p nerve root block with transforaminal injection. Tolerate procedure well. Is hopeful he will have noticeable improvement. Lunch tray at the bedside untouched. Bill Garza is requesting assistance sitting on side of bed. He is hungry and is going to try to eat. Staff also notified.   Length of Stay: 4  Current Medications: Scheduled Meds:   cyclobenzaprine  5 mg Oral TID   dexamethasone (DECADRON) injection  8 mg Intravenous Q24H   dronabinol  2.5 mg Oral TID AC   escitalopram  5 mg Oral Daily   feeding supplement  237 mL Oral BID BM   hydrALAZINE  10 mg Intravenous Once   HYDROmorphone   Intravenous Q4H   insulin aspart  0-15 Units Subcutaneous TID WC   iopamidol       lactulose  13.3333-20 g Oral BID   methadone  10 mg Oral Q8H   metoCLOPramide (REGLAN) injection  5 mg Intravenous Q6H   mometasone-formoterol  2 puff Inhalation BID   montelukast  10 mg Oral QHS   multivitamin with minerals  1 tablet Oral Daily   pantoprazole  40 mg Oral Daily   senna-docusate  2 tablet Oral BID   traZODone  50 mg Oral QHS    Continuous Infusions:  heparin Stopped (08/30/21 0515)    PRN Meds: albuterol, diphenhydrAMINE **OR** diphenhydrAMINE, iopamidol, iopamidol, lidocaine (PF), LORazepam, methylPREDNISolone acetate, naloxone **AND** sodium chloride flush, ondansetron (ZOFRAN) IV, oxyCODONE,  polyethylene glycol  Physical Exam         Awake alert oriented, appears comfortable  NAD Normal breathing pattern  Generalized weakness  Mood appropriate   Vital Signs: BP (!) 153/93 (BP Location: Left Arm)   Pulse 90   Temp 98 F (36.7 C)   Resp 15   Ht 6' (1.829 m)   Wt 72.1 kg   SpO2 99%   BMI 21.56 kg/m  SpO2: SpO2: 99 % O2 Device: O2 Device: Nasal Cannula O2  Flow Rate: O2 Flow Rate (L/min): 1.5 L/min  Intake/output summary:  Intake/Output Summary (Last 24 hours) at 08/30/2021 1524 Last data filed at 08/30/2021 6659 Gross per 24 hour  Intake 120 ml  Output 1000 ml  Net -880 ml    LBM: Last BM Date : 08/29/21 Baseline Weight: Weight: 72.1 kg Most recent weight: Weight: 72.1 kg       Palliative Assessment/Data:      Patient Active Problem List   Diagnosis Date Noted   Generalized pain    General weakness    Constipation    Intractable pain 08/25/2021   SIRS (systemic inflammatory response syndrome) (HCC) 08/25/2021   Normocytic anemia 08/25/2021   Hyponatremia 08/25/2021   Anxiety 08/25/2021   Low back pain 07/30/2021   HTN (hypertension) 07/30/2021   HLD (hyperlipidemia) 07/30/2021   DM2 (diabetes mellitus, type 2) ([place]) 07/30/2021   Gastroparesis 07/30/2021   Chronic constipation 07/30/2021   Depression 07/30/2021   COPD (chronic obstructive pulmonary disease) ([place]) 07/30/2021   Tobacco abuse 07/30/2021   GERD (gastroesophageal reflux disease) 07/30/2021   Elevated alkaline phosphatase level 07/30/2021   Secondary malignant neoplasm of bone and bone marrow ([place]) 07/13/2021   Malignant neoplasm metastatic to brain ([place]) 03/09/2021   Non-small cell lung cancer metastatic to brain ([place]) 01/27/2021   Goals of care, counseling/discussion 01/27/2021    Palliative Care Assessment & Plan   Patient Profile:    Assessment: 61 year old gentleman who lives at home with his wife in [place], [place], history of non-small cell lung cancer with  mets to brain/liver/spine/hip, history of chronic pain tobacco use diabetes COPD anxiety/depression and GERD. Patient admitted to hospital medicine service for intractable back pain and abdominal pain with imaging showing new portal vein thrombosis, started on anticoagulation, oncology colleagues following, MR lumbar thoracic spine showing new and progressive thoracic and lumbosacral osseous metastatic disease burden, enlarging bilateral iliac bone and upper abdominal nodal metastatic disease.  Also has multilevel lumbar spondylosis. Palliative medicine team following for pain management.  Recommendations/Plan:  Nerve block completed today. Tolerated well. Patient is hopeful for improvement post procedure and can work towards getting home and possible treatments.  Continue current pain and non pain symptom management. Will continue to closely monitor. Goal to decrease PCA settings and wean over next 24 hrs to allow for true assessment of pain s/p nerve block and with oral regimen support. (Received total of 2.1 mg today). Ativan every 8 hrs as needed for anxiety Flexeril will be available three times daily as needed   Goals of Care and Additional Recommendations: Limitations on Scope of Treatment: Full Scope Treatment  Code Status:    Code Status Orders  (From admission, onward)           Start     Ordered   08/25/21 1527  Full code  Continuous        08/25/21 1526         Prognosis:  Unable to determine  Discharge Planning: To Be Determined  Care plan was discussed with  patient.   Thank you for allowing the Palliative Medicine Team to assist in the care of this patient.   Billing based on MDM: High   Problems Addressed: One acute or chronic illness or injury with exacerbation, progression, cancer, and/or pain.    Amount and/or Complexity of Data: Category 3:Discussion of management or test interpretation with external physician/other qualified health care  professional/appropriate source (not separately reported). Any controlled substances utilized were prescribed in the  context of palliative care.   Risks: Parenteral controlled substances.   Time Total: 35 min   Visit consisted of counseling and education dealing with the complex and emotionally intense issues of symptom management and palliative care in the setting of serious and potentially life-threatening illness.Greater than 50%  of this time was spent counseling and coordinating care related to the above assessment and plan.  Alda Lea, AGPCNP-BC  Palliative Medicine Team/[place] [place]

## 2021-08-31 DIAGNOSIS — Z515 Encounter for palliative care: Secondary | ICD-10-CM

## 2021-08-31 DIAGNOSIS — C3490 Malignant neoplasm of unspecified part of unspecified bronchus or lung: Secondary | ICD-10-CM

## 2021-08-31 DIAGNOSIS — C7952 Secondary malignant neoplasm of bone marrow: Secondary | ICD-10-CM

## 2021-08-31 DIAGNOSIS — M549 Dorsalgia, unspecified: Secondary | ICD-10-CM

## 2021-08-31 DIAGNOSIS — Z7189 Other specified counseling: Secondary | ICD-10-CM

## 2021-08-31 DIAGNOSIS — R530 Neoplastic (malignant) related fatigue: Secondary | ICD-10-CM

## 2021-08-31 DIAGNOSIS — F419 Anxiety disorder, unspecified: Secondary | ICD-10-CM

## 2021-08-31 DIAGNOSIS — R52 Pain, unspecified: Secondary | ICD-10-CM | POA: Diagnosis not present

## 2021-08-31 DIAGNOSIS — R110 Nausea: Secondary | ICD-10-CM

## 2021-08-31 DIAGNOSIS — C799 Secondary malignant neoplasm of unspecified site: Secondary | ICD-10-CM | POA: Diagnosis not present

## 2021-08-31 LAB — COMPREHENSIVE METABOLIC PANEL
ALT: 42 U/L (ref 0–44)
AST: 42 U/L — ABNORMAL HIGH (ref 15–41)
Albumin: 2.7 g/dL — ABNORMAL LOW (ref 3.5–5.0)
Alkaline Phosphatase: 758 U/L — ABNORMAL HIGH (ref 38–126)
Anion gap: 11 (ref 5–15)
BUN: 10 mg/dL (ref 6–20)
CO2: 24 mmol/L (ref 22–32)
Calcium: 8.9 mg/dL (ref 8.9–10.3)
Chloride: 101 mmol/L (ref 98–111)
Creatinine, Ser: 0.57 mg/dL — ABNORMAL LOW (ref 0.61–1.24)
GFR, Estimated: >60 mL/min (ref >60)
Glucose, Bld: 147 mg/dL — ABNORMAL HIGH (ref 70–99)
Potassium: 4.6 mmol/L (ref 3.5–5.1)
Sodium: 136 mmol/L (ref 135–145)
Total Bilirubin: 1 mg/dL (ref 0.3–1.2)
Total Protein: 6.5 g/dL (ref 6.5–8.1)

## 2021-08-31 LAB — CBC
HCT: 32.3 % — ABNORMAL LOW (ref 39.0–52.0)
Hemoglobin: 10.7 g/dL — ABNORMAL LOW (ref 13.0–17.0)
MCH: 31.1 pg (ref 26.0–34.0)
MCHC: 33.1 g/dL (ref 30.0–36.0)
MCV: 93.9 fL (ref 80.0–100.0)
Platelets: 486 10*3/uL — ABNORMAL HIGH (ref 150–400)
RBC: 3.44 MIL/uL — ABNORMAL LOW (ref 4.22–5.81)
RDW: 15.6 % — ABNORMAL HIGH (ref 11.5–15.5)
WBC: 12.6 10*3/uL — ABNORMAL HIGH (ref 4.0–10.5)
nRBC: 0 % (ref 0.0–0.2)

## 2021-08-31 LAB — GLUCOSE, CAPILLARY
Glucose-Capillary: 143 mg/dL — ABNORMAL HIGH (ref 70–99)
Glucose-Capillary: 166 mg/dL — ABNORMAL HIGH (ref 70–99)
Glucose-Capillary: 131 mg/dL — ABNORMAL HIGH (ref 70–99)
Glucose-Capillary: 108 mg/dL — ABNORMAL HIGH (ref 70–99)

## 2021-08-31 LAB — HEPARIN LEVEL (UNFRACTIONATED): Heparin Unfractionated: 0.29 IU/mL — ABNORMAL LOW (ref 0.30–0.70)

## 2021-08-31 MED ORDER — RIVAROXABAN 15 MG PO TABS: 15.0000 mg | ORAL_TABLET | Freq: Two times a day (BID) | ORAL | Status: DC

## 2021-08-31 MED ORDER — HYDROMORPHONE HCL 1 MG/ML IJ SOLN
0.5000 mg | INTRAMUSCULAR | Status: AC
  Administered 2021-08-31: 0.5 mg via INTRAVENOUS
  Filled 2021-08-31: qty 0.5

## 2021-08-31 MED ORDER — METHADONE HCL 10 MG PO TABS
10.0000 mg | ORAL_TABLET | Freq: Two times a day (BID) | ORAL | Status: DC
  Administered 2021-09-01 – 2021-09-03 (×5): 10 mg via ORAL
  Filled 2021-08-31 (×5): qty 1

## 2021-08-31 MED ORDER — NALOXONE HCL 0.4 MG/ML IJ SOLN: 0.4000 mg | INTRAMUSCULAR | Status: DC | PRN

## 2021-08-31 MED ORDER — HYDROMORPHONE HCL 1 MG/ML IJ SOLN
0.5000 mg | INTRAMUSCULAR | Status: AC | PRN
  Administered 2021-08-31 – 2021-09-01 (×2): 0.5 mg via INTRAVENOUS
  Filled 2021-08-31 (×2): qty 1

## 2021-08-31 MED ORDER — RIVAROXABAN 20 MG PO TABS: 20.0000 mg | ORAL_TABLET | Freq: Every day | ORAL | Status: DC

## 2021-08-31 MED ORDER — FUROSEMIDE 10 MG/ML IJ SOLN
40.0000 mg | Freq: Once | INTRAMUSCULAR | Status: AC
Start: 2021-08-31 — End: 2021-08-31
  Administered 2021-08-31: 40 mg via INTRAVENOUS
  Filled 2021-08-31: qty 4

## 2021-08-31 MED ORDER — PREDNISONE 20 MG PO TABS
20.0000 mg | ORAL_TABLET | Freq: Every day | ORAL | Status: DC
  Administered 2021-08-31 – 2021-09-03 (×4): 20 mg via ORAL
  Filled 2021-08-31 (×4): qty 1

## 2021-08-31 MED ORDER — ORAL CARE MOUTH RINSE
15.0000 mL | Freq: Two times a day (BID) | OROMUCOSAL | Status: DC
  Administered 2021-09-03: 15 mL via OROMUCOSAL

## 2021-08-31 MED ORDER — PANTOPRAZOLE SODIUM 40 MG PO TBEC
40.0000 mg | DELAYED_RELEASE_TABLET | Freq: Two times a day (BID) | ORAL | Status: DC
  Administered 2021-08-31 – 2021-09-03 (×7): 40 mg via ORAL
  Filled 2021-08-31 (×7): qty 1

## 2021-08-31 MED ORDER — LABETALOL HCL 5 MG/ML IV SOLN: 5.0000 mg | INTRAVENOUS | Status: DC | PRN

## 2021-08-31 MED ORDER — CHLORHEXIDINE GLUCONATE CLOTH 2 % EX PADS
6.0000 | MEDICATED_PAD | Freq: Every day | CUTANEOUS | Status: DC
  Administered 2021-08-31 – 2021-09-01 (×2): 6 via TOPICAL

## 2021-08-31 MED ORDER — RIVAROXABAN 15 MG PO TABS
15.0000 mg | ORAL_TABLET | Freq: Two times a day (BID) | ORAL | Status: DC
  Administered 2021-08-31 – 2021-09-03 (×7): 15 mg via ORAL
  Filled 2021-08-31 (×6): qty 1

## 2021-08-31 NOTE — Plan of Care (Signed)

## 2021-08-31 NOTE — Progress Notes (Signed)
Daily Progress Note   Patient Name: Bill Garza       Date: 08/31/2021 DOB: [birth year]  Age: 61 y.o. MRN#: [id number] Attending Physician: Kayleen Memos, DO Primary Care Physician: Aleen Campi, NP Admit Date: 08/25/2021  Reason for Consultation/Follow-up: Pain control  Subjective: Bill Garza awake and alert in bed. Bill Garza and Bill Garza also present. He is complaining of left hip pain. Is appreciative of much improved back pain s/p nerve block. He continues to have significant weakness and remains frail. Is requesting ativan for anxiety. Is asking for medication to be given by IV. Education provided on oral medication as ordered in addition to concerns for respiratory depression and lethargy.   Family is expressing concerns regarding care and medications. Patient has only used PCA once today. He appears concerned about pushing button when in pain. Bill Garza expressed wishes to discontinue medications with concern of his lethargy. Family is remaining hopeful for improvement allowing patient to discharge home and begin any available treatments as discussed with [name]. They are aware his functional and health condition has to improve in order for this to be an option. Extensive discussion with patient and family regarding current pain regimen and all symptom management needs. Goal is to attempt to wean IV medications and transition to a tolerable oral regimen in anticipation of Navdeep returning home.   Bill Garza reports patient was no longer taking methadone at home and pain was being managed by [name]. He was taking hydrocodone and toradol. Will plan to wean medication with hopes patient's pain will remain controlled on PRN medications.    Dr. [place] Crane also at bedside. Unfortunately given wishes to remain  a full code with full scope care plans are to transfer to step down for close monitoring and care. Bill Garza and Bill Garza are clear he does not want a foley catheter placed for any reason as this was suggested earlier today.   [name] requesting to sit up on the side of the bed. Myself and [name], RN assisted patient to sitting position. He tolerated well compared to previous attempts an encountering significant pain. Continues to expressed decreased back pain but left hip discomfort is main concern.   As requested Palliative will continue to assist with symptom management as needed. I am concerned about Bill Garza quality of life and family's  clear understanding of what the future will look like. I will defer discussions from Oncology standpoint to [name] as he has been speaking with family throughout this admission. Palliative is always available to assist in goals of care and complex medical decision making, assisting with realistic expectations, and offering support to patient, family, and medical team.   Bill Garza and Bill Garza are clear in their expressed goals to treat aggressively allowing [name] every opportunity to continue to thrive. They are remaining hopeful. We will continue to support their wishes as best possible in collaboration with the medical team.   All question answered and support provided.   Length of Stay: 5  Current Medications: Scheduled Meds:   Chlorhexidine Gluconate Cloth  6 each Topical Daily   dronabinol  2.5 mg Oral TID AC   escitalopram  5 mg Oral Daily   feeding supplement  237 mL Oral BID BM   hydrALAZINE  10 mg Intravenous Once   insulin aspart  0-15 Units Subcutaneous TID WC   lactulose  13.3333-20 g Oral BID   mouth rinse  15 mL Mouth Rinse BID   methadone  10 mg Oral Q12H   metoCLOPramide (REGLAN) injection  5 mg Intravenous Q6H   mometasone-formoterol  2 puff Inhalation BID   montelukast  10 mg Oral QHS   multivitamin with minerals  1 tablet Oral Daily    pantoprazole  40 mg Oral BID   predniSONE  20 mg Oral Q breakfast   Rivaroxaban  15 mg Oral BID WC   Followed by   Derrill Memo ON 09/21/2021] rivaroxaban  20 mg Oral Q supper   senna-docusate  2 tablet Oral BID   traZODone  50 mg Oral QHS    Continuous Infusions:    PRN Meds: albuterol, cyclobenzaprine, HYDROmorphone (DILAUDID) injection, iopamidol, labetalol, lidocaine (PF), LORazepam, methylPREDNISolone acetate, naLOXone (NARCAN)  injection, ondansetron (ZOFRAN) IV, oxyCODONE, polyethylene glycol  Physical Exam         Awake alert oriented, frail, ill appearing  Normal breathing pattern  Generalized weakness  Mood appropriate   Vital Signs: BP (!) 164/99 (BP Location: Right Arm)   Pulse 81   Temp 97.7 F (36.5 C) (Oral)   Resp 15   Ht 6' (1.829 m)   Wt 72.1 kg   SpO2 94%   BMI 21.56 kg/m  SpO2: SpO2: 94 % O2 Device: O2 Device: Nasal Cannula O2 Flow Rate: O2 Flow Rate (L/min): 2 L/min  Intake/output summary:  Intake/Output Summary (Last 24 hours) at 08/31/2021 1848 Last data filed at 08/31/2021 1829 Gross per 24 hour  Intake 813.98 ml  Output 500 ml  Net 313.98 ml    LBM: Last BM Date : 08/29/21 Baseline Weight: Weight: 72.1 kg Most recent weight: Weight: 72.1 kg       Palliative Assessment/Data:      Patient Active Problem List   Diagnosis Date Noted   Generalized pain    General weakness    Constipation    Intractable pain 08/25/2021   SIRS (systemic inflammatory response syndrome) (HCC) 08/25/2021   Normocytic anemia 08/25/2021   Hyponatremia 08/25/2021   Anxiety 08/25/2021   Low back pain 07/30/2021   HTN (hypertension) 07/30/2021   HLD (hyperlipidemia) 07/30/2021   DM2 (diabetes mellitus, type 2) ([place]) 07/30/2021   Gastroparesis 07/30/2021   Chronic constipation 07/30/2021   Depression 07/30/2021   COPD (chronic obstructive pulmonary disease) ([place]) 07/30/2021   Tobacco abuse 07/30/2021   GERD (gastroesophageal reflux disease) 07/30/2021  Elevated alkaline phosphatase level 07/30/2021   Secondary malignant neoplasm of bone and bone marrow ([place]) 07/13/2021   Malignant neoplasm metastatic to brain [facility]) 03/09/2021   Non-small cell lung cancer metastatic to brain [facility]) 01/27/2021   Goals of care, counseling/discussion 01/27/2021    Palliative Care Assessment & Plan   Patient Profile:    Assessment: 61 year old gentleman who lives at home with his Bill Garza in [place], [place], history of non-small cell lung cancer with mets to brain/liver/spine/hip, history of chronic pain tobacco use diabetes COPD anxiety/depression and GERD. Patient admitted to hospital medicine service for intractable back pain and abdominal pain with imaging showing new portal vein thrombosis, started on anticoagulation, oncology colleagues following, MR lumbar thoracic spine showing new and progressive thoracic and lumbosacral osseous metastatic disease burden, enlarging bilateral iliac bone and upper abdominal nodal metastatic disease.  Also has multilevel lumbar spondylosis. Palliative medicine team following for pain management.  Recommendations/Plan:  Nerve block on 6/12. Tolerated well. Pain is much improved post procedure. Denies back pain. Complains of left hip pain.  Bill Garza is requesting medications be weaned to allow for improvement while also effectively managing pain. She does not wish for him to hurt.  Will continue to closely monitor.  Discontinue PCA  (Received total of 0.6 mg today). Ativan every 8 hrs as needed for anxiety Decrease methadone to every 12 hours. Will have to monitor closely and wean appropriately. Family concerned about sedation.  Flexeril will be available three times daily as needed  Palliative will continue to support and follow as needed.   Goals of Care and Additional Recommendations: Limitations on Scope of Treatment: Full Scope Treatment  Code Status:    Code Status Orders  (From admission, onward)            Start     Ordered   08/25/21 1527  Full code  Continuous        08/25/21 1526         Prognosis:  Unable to determine  Discharge Planning: To Be Determined  Care plan was discussed with  patient.   Thank you for allowing the Palliative Medicine Team to assist in the care of this patient.   Billing based on MDM: High   Problems Addressed: One acute or chronic illness or injury with exacerbation, progression, cancer, and pain.    Amount and/or Complexity of Data: Category 3:Discussion of management or test interpretation with external physician/other qualified health care professional/appropriate source (not separately reported). Any controlled substances utilized were prescribed in the context of palliative care.   Risks: Parenteral controlled substances.   Time Total: 45 min   Visit consisted of counseling and education dealing with the complex and emotionally intense issues of symptom management and palliative care in the setting of serious and potentially life-threatening illness.Greater than 50%  of this time was spent counseling and coordinating care related to the above assessment and plan.  Alda Lea, AGPCNP-BC  Palliative Medicine Team/[facility]

## 2021-08-31 NOTE — Progress Notes (Addendum)
PROGRESS NOTE  Bill Garza MVE:[id number] DOB: 1960/07/03 DOA: 08/25/2021 PCP: Aleen Campi, NP  HPI/Recap of past 24 hours: 61 y.o. male with medical history significant of NSCLC w/ mets to brain/liver/spine/hip, chronic pain syndrome on chronic opioids and methadone, tobacco abuse, DM2, COPD not on O2 supplement at baseline, chronic anxiety/depression on chronic oral ativan, GERD, who presented with progressive intractable back and abdominal pain.  Contrast CT abdomen and pelvis revealed findings compatible with progression of metastatic disease, new hepatic metastasis with new small pulmonary nodules, enlarged upper abdominal lymph nodes and progression of bone disease.  Significant change in the appearance of the liver with innumerable hepatic lesions.  Evidence of thrombosed right portal vein branches.  The main right portal vein is narrowed compared to the previous examination.  Scattered bony lesions compatible with metastatic disease.  Enlargement of a lytic expansile lesion involving the right iliac wing.  Umbilical hernia.  Bilateral inguinal hernias.  Aortic atherosclerosis.   The patient was started on heparin drip for new portal vein thrombosis.  Post right L4 nerve root block and transforaminal epidural on 08/30/2021.  On 08/31/2021 heparin drip was switched to Xarelto.  Due to intractable pain not improved with as needed IV opioids, the patient was started on PCA Dilaudid pump on 08/28/2021.  08/31/21: Continues to have high level of pain 10 out of 10 despite L4 nerve root block and transforaminal epidural on 08/30/2021, IV Dilaudid pump and oxycodone.  More anxious requiring Ativan.  Very weak, dyspneic and tachycardic.    The patient is a full code and requires higher level of care and close monitoring due to high risk for decompensation while on Dilaudid PCA pump, methadone, oxycodone, lorazepam, and scheduled dronabinol.  Transferred to stepdown unit on  08/31/2021.   Assessment/Plan: Principal Problem:   Intractable pain Active Problems:   Non-small cell lung cancer metastatic to brain [facility])   Secondary malignant neoplasm of bone and bone marrow (HCC)   DM2 (diabetes mellitus, type 2) (HCC)   Depression   COPD (chronic obstructive pulmonary disease) (HCC)   Tobacco abuse   Elevated alkaline phosphatase level   SIRS (systemic inflammatory response syndrome) (HCC)   Normocytic anemia   Hyponatremia   Anxiety   Generalized pain   General weakness   Constipation  Intractable diffuse pain secondary to malignancy and progressive metastatic disease  Non-small cell lung cancer with metastasis to the brain, liver, lungs, spine, hip. Appreciate palliative care team and medical oncology's assistance. Due to intractable pain not improved with as needed IV opioids, the patient was started on PCA Dilaudid pump on 08/28/2021. Post right L4 nerve root block and transforaminal epidural on 08/30/2021.   The patient is a full code and requires higher level of care and close monitoring due to high risk for decompensation while on Dilaudid PCA pump, methadone, oxycodone, lorazepam, and scheduled dronabinol.  Transferred to stepdown unit on 08/31/2021.   Portal vein thrombosis, seen on CT scan:  Heparin drip was switched to Xarelto on 08/31/2021.   Currently on GI prophylaxis p.o. Protonix twice daily while on steroids and DOAC.  Tachycardia Obtain 12 lead EKG and add on TSH Monitor with telemetry  Elevated liver chemistries Alkaline phosphatase uptrending in the setting of bone mets  Bilateral lower extremity edema likely contributed by hypoalbuminemia Albumin 2.7 Elevate lower extremities 1 dose of IV Lasix 40 mg x 1 given on 08/30/2021 and on 08/31/2021  Resolved non-anion gap metabolic acidosis Serum bicarb 24 from 19 and  anion gap of 8 Continue to encourage oral intake   DM2 with hyperglycemia:  A1c 7.1 blood sugar poorly controlled,  likely in the setting of patient's steroids increase sliding scale increase to 0-15 unit Continue to hold metformin   Chronic anxiety/depression:  On chronic Ativan  Severe protein calorie malnutrition BMI 21 Albumin 2.7 Severe muscle mass loss Liberalize diet Dietitian consult  COPD Tobacco abuse: Bronchodilators  Elevated Bps No prior history of hypertension IV labetalol as needed   Normocytic anemia Hemoglobin is stable 10.7. No overt bleeding  Resolved hyponatremia:  Serum sodium 136  Leukocytosis Suspect demargination from use of steroids Monitor fever curve and WBC  Generalized weakness in the setting of advanced malignancy Continue fall precautions  Goals of care Full code Palliative care and medical oncology following The patient is a full code and requires higher level of care and close monitoring due to high risk for decompensation while on Dilaudid PCA pump, methadone, oxycodone, lorazepam, and scheduled dronabinol.  Transferred to stepdown unit on 08/31/2021.      Critical care time: 65 minutes.      DVT prophylaxis: Xarelto Code Status:   Code Status: Full Code Family Communication: Plan discussed with the patient's wife at bedside.  Patient status is: Inpatient because of ongoing need for IV pain management with intractable pain Level of care: Telemetry    Dispo: The patient is from: Home            Anticipated disposition: Pending pain control.        Status is: Inpatient The patient requires at least 2 midnights for further evaluation and treatment of present condition.    Objective: Vitals:   08/31/21 0910 08/31/21 0950 08/31/21 1200 08/31/21 1447  BP:  (!) 160/91  (!) 165/91  Pulse:  90  100  Resp:  17 16 19   Temp:  98 F (36.7 C)  97.7 F (36.5 C)  TempSrc:  Oral  Oral  SpO2: 92% 98% 97% 97%  Weight:      Height:        Intake/Output Summary (Last 24 hours) at 08/31/2021 1600 Last data filed at 08/31/2021 0600 Gross  per 24 hour  Intake 813.98 ml  Output --  Net 813.98 ml   Filed Weights   08/25/21 0809  Weight: 72.1 kg    Exam:  General: 61 y.o. year-old male frail-appearing appears uncomfortable conversational dyspnea.  Lethargic.  Oriented x3.   Cardiovascular: Tachycardic with no rubs or gallops. Respiratory: Mild rales at bases.  Poor inspiratory effort. Abdomen: Tender with palpation.  Bowel sounds present. Musculoskeletal: 1+ pitting edema in lower extremities bilaterally.  Tender with palpation. Skin: No ulcerative lesions noted. Psychiatry: Mood is appropriate for condition and setting.   Data Reviewed: CBC: Recent Labs  Lab 08/25/21 0812 08/26/21 0532 08/27/21 0231 08/28/21 0601 08/29/21 0616 08/30/21 0519 08/31/21 0503  WBC 12.8*   < > 10.0 13.8* 12.2* 10.7* 12.6*  NEUTROABS 10.4*  --   --   --   --   --   --   HGB 12.0*   < > 11.1* 10.5* 10.4* 10.6* 10.7*  HCT 36.3*   < > 34.2* 32.7* 32.7* 32.9* 32.3*  MCV 91.2   < > 94.2 94.5 95.3 94.8 93.9  PLT 591*   < > 556* 446* 468* 475* 486*   < > = values in this interval not displayed.   Basic Metabolic Panel: Recent Labs  Lab 08/27/21 0231 08/28/21 0601 08/29/21 0616 08/30/21  0519 08/31/21 0503  NA 134* 137 138 136 136  K 4.4 4.2 4.4 4.2 4.6  CL 106 108 107 101 101  CO2 21* 21* 23 24 24   GLUCOSE 236* 210* 190* 172* 147*  BUN 9 8 7 7 10   CREATININE 0.52* 0.52* 0.51* 0.52* 0.57*  CALCIUM 8.1* 8.4* 8.6* 8.7* 8.9   GFR: Estimated Creatinine Clearance: 102.6 mL/min (A) (by C-G formula based on SCr of 0.57 mg/dL (L)). Liver Function Tests: Recent Labs  Lab 08/26/21 0532 08/28/21 0601 08/29/21 0616 08/30/21 0519 08/31/21 0503  AST 25 55* 63* 49* 42*  ALT 22 43 52* 48* 42  ALKPHOS 644* 904* 858* 746* 758*  BILITOT 0.6 0.5 0.4 1.1 1.0  PROT 5.3* 5.9* 6.0* 6.1* 6.5  ALBUMIN 2.2* 2.5* 2.6* 2.7* 2.7*   Recent Labs  Lab 08/25/21 0812  LIPASE 19   No results for input(s): "AMMONIA" in the last 168  hours. Coagulation Profile: Recent Labs  Lab 08/25/21 0816  INR 1.6*   Cardiac Enzymes: No results for input(s): "CKTOTAL", "CKMB", "CKMBINDEX", "TROPONINI" in the last 168 hours. BNP (last 3 results) No results for input(s): "PROBNP" in the last 8760 hours. HbA1C: No results for input(s): "HGBA1C" in the last 72 hours.  CBG: Recent Labs  Lab 08/30/21 1131 08/30/21 1611 08/30/21 2209 08/31/21 0807 08/31/21 1231  GLUCAP 104* 141* 119* 143* 166*   Lipid Profile: No results for input(s): "CHOL", "HDL", "LDLCALC", "TRIG", "CHOLHDL", "LDLDIRECT" in the last 72 hours. Thyroid Function Tests: No results for input(s): "TSH", "T4TOTAL", "FREET4", "T3FREE", "THYROIDAB" in the last 72 hours. Anemia Panel: No results for input(s): "VITAMINB12", "FOLATE", "FERRITIN", "TIBC", "IRON", "RETICCTPCT" in the last 72 hours.  Urine analysis:    Component Value Date/Time   COLORURINE YELLOW 08/25/2021 [place] 08/25/2021 0749   LABSPEC 1.006 08/25/2021 0749   PHURINE 7.0 08/25/2021 0749   GLUCOSEU NEGATIVE 08/25/2021 0749   HGBUR NEGATIVE 08/25/2021 0749   BILIRUBINUR NEGATIVE 08/25/2021 0749   KETONESUR 20 (A) 08/25/2021 0749   PROTEINUR NEGATIVE 08/25/2021 0749   NITRITE NEGATIVE 08/25/2021 0749   LEUKOCYTESUR NEGATIVE 08/25/2021 0749   Sepsis Labs: @LABRCNTIP (procalcitonin:4,lacticidven:4)  ) Recent Results (from the past 240 hour(s))  Culture, blood (Routine X 2) w Reflex to ID Panel     Status: None   Collection Time: 08/25/21  3:01 PM   Specimen: Right Antecubital; Blood  Result Value Ref Range Status   Specimen Description   Final    RIGHT ANTECUBITAL BLOOD Performed at [facility], [address] [place], [place] [address]    Special Requests   Final    Blood Culture adequate volume BOTTLES DRAWN AEROBIC AND ANAEROBIC Performed at [facility], [address] [place], [place] [address]    Culture   Final    NO GROWTH 5  DAYS Performed at [facility], [place] [address], [place], [place] [address]    Report Status 08/30/2021 FINAL  Final  Culture, blood (Routine X 2) w Reflex to ID Panel     Status: Abnormal   Collection Time: 08/25/21  5:30 PM   Specimen: BLOOD  Result Value Ref Range Status   Specimen Description BLOOD SITE NOT SPECIFIED  Final   Special Requests IN PEDIATRIC BOTTLE Blood Culture adequate volume  Final   Culture  Setup Time   Final    GRAM POSITIVE COCCI IN CLUSTERS IN PEDIATRIC BOTTLE CRITICAL RESULT CALLED TO, READ BACK BY  AND VERIFIED WITH: L POINDEXTER,[email or web address]  08/26/21 [place]    Culture (A)  Final    STAPHYLOCOCCUS EPIDERMIDIS THE SIGNIFICANCE OF ISOLATING THIS ORGANISM FROM A SINGLE VENIPUNCTURE CANNOT BE PREDICTED WITHOUT FURTHER CLINICAL AND CULTURE CORRELATION. SUSCEPTIBILITIES AVAILABLE ONLY ON REQUEST. Performed at [facility], [place] [address] [place], [place] [address]    Report Status 08/28/2021 FINAL  Final  Blood Culture ID Panel (Reflexed)     Status: Abnormal   Collection Time: 08/25/21  5:30 PM  Result Value Ref Range Status   Enterococcus faecalis NOT DETECTED NOT DETECTED Final   Enterococcus Faecium NOT DETECTED NOT DETECTED Final   Listeria monocytogenes NOT DETECTED NOT DETECTED Final   Staphylococcus species DETECTED (A) NOT DETECTED Final    Comment: CRITICAL RESULT CALLED TO, READ BACK BY AND VERIFIED WITH: L POINDEXTER,[email or web address]  08/26/21 [place]    Staphylococcus aureus (BCID) NOT DETECTED NOT DETECTED Final   Staphylococcus epidermidis DETECTED (A) NOT DETECTED Final    Comment: CRITICAL RESULT CALLED TO, READ BACK BY AND VERIFIED WITH: L POINDEXTER,[email or web address]  08/26/21 [place]    Staphylococcus lugdunensis NOT DETECTED NOT DETECTED Final   Streptococcus species NOT DETECTED NOT DETECTED Final   Streptococcus agalactiae NOT DETECTED NOT DETECTED Final   Streptococcus pneumoniae NOT DETECTED NOT DETECTED Final   Streptococcus pyogenes NOT DETECTED  NOT DETECTED Final   A.calcoaceticus-baumannii NOT DETECTED NOT DETECTED Final   Bacteroides fragilis NOT DETECTED NOT DETECTED Final   Enterobacterales NOT DETECTED NOT DETECTED Final   Enterobacter cloacae complex NOT DETECTED NOT DETECTED Final   Escherichia coli NOT DETECTED NOT DETECTED Final   Klebsiella aerogenes NOT DETECTED NOT DETECTED Final   Klebsiella oxytoca NOT DETECTED NOT DETECTED Final   Klebsiella pneumoniae NOT DETECTED NOT DETECTED Final   Proteus species NOT DETECTED NOT DETECTED Final   Salmonella species NOT DETECTED NOT DETECTED Final   Serratia marcescens NOT DETECTED NOT DETECTED Final   Haemophilus influenzae NOT DETECTED NOT DETECTED Final   Neisseria meningitidis NOT DETECTED NOT DETECTED Final   Pseudomonas aeruginosa NOT DETECTED NOT DETECTED Final   Stenotrophomonas maltophilia NOT DETECTED NOT DETECTED Final   Candida albicans NOT DETECTED NOT DETECTED Final   Candida auris NOT DETECTED NOT DETECTED Final   Candida glabrata NOT DETECTED NOT DETECTED Final   Candida krusei NOT DETECTED NOT DETECTED Final   Candida parapsilosis NOT DETECTED NOT DETECTED Final   Candida tropicalis NOT DETECTED NOT DETECTED Final   Cryptococcus neoformans/gattii NOT DETECTED NOT DETECTED Final   Methicillin resistance mecA/C NOT DETECTED NOT DETECTED Final    Comment: Performed at [facility], [place]. [address] [place], [place] [address]      Studies: No results found.  Scheduled Meds:  dronabinol  2.5 mg Oral TID AC   escitalopram  5 mg Oral Daily   feeding supplement  237 mL Oral BID BM   hydrALAZINE  10 mg Intravenous Once   HYDROmorphone   Intravenous Q4H   insulin aspart  0-15 Units Subcutaneous TID WC   lactulose  13.3333-20 g Oral BID   methadone  10 mg Oral Q8H   metoCLOPramide (REGLAN) injection  5 mg Intravenous Q6H   mometasone-formoterol  2 puff Inhalation BID   montelukast  10 mg Oral QHS   multivitamin with minerals  1 tablet Oral Daily    pantoprazole  40 mg Oral BID   predniSONE  20 mg Oral Q breakfast   Rivaroxaban  15 mg Oral BID WC   Followed by   [  START ON 09/21/2021] rivaroxaban  20 mg Oral Q supper   senna-docusate  2 tablet Oral BID   traZODone  50 mg Oral QHS    Continuous Infusions:     LOS: 5 days     Kayleen Memos, MD [facility] Pager [phone]  If 7PM-7AM, please contact night-coverage [email or web address] Password [facility] 08/31/2021, 4:00 PM

## 2021-08-31 NOTE — Progress Notes (Signed)
Bill Garza did have the nerve block yesterday.  I do appreciate Interventional radiology doing this.  They really did a great job.  Looks like they hit the right spot.  He says that he is feeling better and not having as much pain.  Hopefully this will be the case.  I think we can now get him off the heparin and get him on Xarelto.  I think Xarelto would be reasonable for him.  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  Again we have to try to get him off the PCA pump.  Try to get him on an oral regimen for his pain.  If he gets good pain relief, then we can see about doing the RFA as an outpatient.  He did not eat much yesterday.  Hopefully, he will eat a little bit more today concerned that he may have less pain.  His white cell count is 12.6.  Hemoglobin 10.7.  Platelet count 486,000.  The sodium 136.  Potassium 4.6.  BUN 10 and creatinine was 0.57.  Calcium is 8.9 with an albumin of 2.7.  His alkaline phosphatase is 758.  This is all from the liver metastasis.  I am still not sure that we will be able to treat the cancer.  He really needs to be in a little bit better shape.  If we do treat them, he will need to have a Port-A-Cath placed.  Our goal now is clearly to see how his activity level is now that he had a nerve block.  Again I really hope that he will be able to do a little bit more.  Hopefully will be able to get onto a good oral regimen for his pain.  His vital signs are temperature of 98.  Pulse 103.  Blood pressure 163/88.  His head neck exam shows no ocular or oral lesions.  He has no adenopathy in the neck.  Lungs are relatively clear bilaterally.  Cardiac exam regular rate and rhythm.  Abdomen is soft.  There may be a little bit of distention.  Bowel sounds are slightly decreased.  Extremity shows no clubbing, cyanosis or edema.  Neurological exam is nonfocal.  We hopefully have gotten over the "hump" with respect to the pain.  We will have to see how he does today after  having the nerve block yesterday.  Hopefully he will eat a little bit more today.  I do appreciate the incredible care that he has received from all the staff up on 5 E.   Lattie Haw, MD  Hebrew[place] 12:12

## 2021-08-31 NOTE — Progress Notes (Signed)
While RN and NT were doing an EKG on the pt the RN noted that the pts son was video recording. RN asked the son if he was video recording and the son stated "It is my mother fucking given right to record." RN educated son that this was illegal to record nursing staff while caring for the pt. Pts son became upset and started to cuss at RN and left the room. AC informed and spoke with pts son.

## 2021-08-31 NOTE — Progress Notes (Signed)
[place] for heparin Indication:  VTE treatment  (portal vein thrombosis)  Allergies  Allergen Reactions   Gabapentin Swelling    Leg swelling    Levetiracetam Other (See Comments)    Personality changes   Pregabalin Swelling    Leg swelling    Bupropion Nausea And Vomiting   Duloxetine Other (See Comments)    Excessive sleeping   Ezetimibe Other (See Comments)    Unknown reaction   Other Other (See Comments)    Antibiotics - per wife at bedside, patient will develop C-diff if given "any antibiotic"   Prochlorperazine Nausea And Vomiting   Lamotrigine Itching and Rash    Patient Measurements: Height: 6' (182.9 cm) Weight: 72.1 kg (159 lb) IBW/kg (Calculated) : 77.6 Heparin Dosing Weight: 72.1 kg  Vital Signs: Temp: 98 F (36.7 C) (06/13 0459) Temp Source: Oral (06/13 0459) BP: 163/88 (06/13 0459) Pulse Rate: 103 (06/13 0459)  Labs: Recent Labs    08/28/21 0601 08/29/21 0616 08/29/21 1036 08/30/21 0519 08/31/21 0503  HGB 10.5* 10.4*  --  10.6* 10.7*  HCT 32.7* 32.7*  --  32.9* 32.3*  PLT 446* 468*  --  475* 486*  HEPARINUNFRC 0.50 0.22* 0.34 0.29* 0.29*  CREATININE 0.52* 0.51*  --  0.52*  --      Estimated Creatinine Clearance: 102.6 mL/min (A) (by C-G formula based on SCr of 0.52 mg/dL (L)).   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 CT abdomen/pelvis showed right portal vein thrombosis. [facility] spoke with neurosurgery team and they stated no contraindication for anticoagulation from their standpoint. He's currently on heparin drip for VTE treatment.  No anticoagulation noted PTA.   Today, 08/31/2021: - heparin level 0.29- slightly subtherapeutic on IV heparin 1650 units/hr  - cbc : Hg low/stable, pltc elevated/stable - no bleeding or infusion related concerns reported by  RN  Goal of Therapy:  Heparin level 0.3-0.7 units/ml Monitor platelets by anticoagulation protocol: Yes   Plan:  - Increase heparin 1750 units/hr - Repeat heparin level 6h after rate change - Daily CBC, heparin level  - Monitor closely for s/sx of bleeding  Netta Cedars, PharmD, BCPS 08/31/2021 5:55 AM

## 2021-08-31 NOTE — Progress Notes (Signed)
During rounds this afternoon, pt noted to be very unsteady upon standing to use the urinal. The pt and the pt's son were both educated by the RN and NT on the use of the primafit to collect the urine. RN and NT informed the pt and pts son that this would be better for his safety since he is unsteady on his feet. The pts son became verbally aggressive and cursing towards the RN and NT. Pt's son stated "What is your fucking problem?" RN informed the son that he needed to be respectful towards all staff. RN informed Probation officer. Surveyor, quantity and RN talked with pts son and informed him that if he was disrespectful towards staff again that security would be called.  Throughout the rest of the day the pts family continued to be disrespectful towards RN. Pts family interfered with care and safety precautions. Family turned off bed alarm and folded up the floor mats. Upon entering room multiple times had to remove pts PCA button from around top of IV pole to return to pt. MD made aware. AC made aware. Surveyor, quantity made aware. MD placed orders for restricted visitation for family that interferes with care or is disrespectful towards nursing staff. MD came to bedside to speak with pt and family. Orders placed for transfer to ICU Stepdown.

## 2021-09-01 ENCOUNTER — Inpatient Hospital Stay: Payer: Medicare HMO | Admitting: Nurse Practitioner

## 2021-09-01 DIAGNOSIS — C799 Secondary malignant neoplasm of unspecified site: Secondary | ICD-10-CM

## 2021-09-01 DIAGNOSIS — G893 Neoplasm related pain (acute) (chronic): Secondary | ICD-10-CM | POA: Diagnosis not present

## 2021-09-01 DIAGNOSIS — R52 Pain, unspecified: Secondary | ICD-10-CM | POA: Diagnosis not present

## 2021-09-01 DIAGNOSIS — Z515 Encounter for palliative care: Secondary | ICD-10-CM

## 2021-09-01 DIAGNOSIS — F419 Anxiety disorder, unspecified: Secondary | ICD-10-CM

## 2021-09-01 DIAGNOSIS — R530 Neoplastic (malignant) related fatigue: Secondary | ICD-10-CM

## 2021-09-01 DIAGNOSIS — E440 Moderate protein-calorie malnutrition: Secondary | ICD-10-CM | POA: Insufficient documentation

## 2021-09-01 DIAGNOSIS — C3490 Malignant neoplasm of unspecified part of unspecified bronchus or lung: Secondary | ICD-10-CM

## 2021-09-01 LAB — CBC WITH DIFFERENTIAL/PLATELET
Abs Immature Granulocytes: 0.22 10*3/uL — ABNORMAL HIGH (ref 0.00–0.07)
Basophils Absolute: 0 10*3/uL (ref 0.0–0.1)
Basophils Relative: 0 %
Eosinophils Absolute: 0 10*3/uL (ref 0.0–0.5)
Eosinophils Relative: 0 %
HCT: 33.4 % — ABNORMAL LOW (ref 39.0–52.0)
Hemoglobin: 10.9 g/dL — ABNORMAL LOW (ref 13.0–17.0)
Immature Granulocytes: 2 %
Lymphocytes Relative: 13 %
Lymphs Abs: 1.5 10*3/uL (ref 0.7–4.0)
MCH: 30.8 pg (ref 26.0–34.0)
MCHC: 32.6 g/dL (ref 30.0–36.0)
MCV: 94.4 fL (ref 80.0–100.0)
Monocytes Absolute: 1.4 10*3/uL — ABNORMAL HIGH (ref 0.1–1.0)
Monocytes Relative: 12 %
Neutro Abs: 9.1 10*3/uL — ABNORMAL HIGH (ref 1.7–7.7)
Neutrophils Relative %: 73 %
Platelets: 458 10*3/uL — ABNORMAL HIGH (ref 150–400)
RBC: 3.54 MIL/uL — ABNORMAL LOW (ref 4.22–5.81)
RDW: 15.7 % — ABNORMAL HIGH (ref 11.5–15.5)
WBC: 12.3 10*3/uL — ABNORMAL HIGH (ref 4.0–10.5)
nRBC: 0 % (ref 0.0–0.2)

## 2021-09-01 LAB — COMPREHENSIVE METABOLIC PANEL
ALT: 39 U/L (ref 0–44)
AST: 39 U/L (ref 15–41)
Albumin: 2.9 g/dL — ABNORMAL LOW (ref 3.5–5.0)
Alkaline Phosphatase: 765 U/L — ABNORMAL HIGH (ref 38–126)
Anion gap: 11 (ref 5–15)
BUN: 13 mg/dL (ref 6–20)
CO2: 26 mmol/L (ref 22–32)
Calcium: 8.9 mg/dL (ref 8.9–10.3)
Chloride: 99 mmol/L (ref 98–111)
Creatinine, Ser: 0.61 mg/dL (ref 0.61–1.24)
GFR, Estimated: >60 mL/min (ref >60)
Glucose, Bld: 113 mg/dL — ABNORMAL HIGH (ref 70–99)
Potassium: 4 mmol/L (ref 3.5–5.1)
Sodium: 136 mmol/L (ref 135–145)
Total Bilirubin: 0.9 mg/dL (ref 0.3–1.2)
Total Protein: 6.4 g/dL — ABNORMAL LOW (ref 6.5–8.1)

## 2021-09-01 LAB — GLUCOSE, CAPILLARY
Glucose-Capillary: 125 mg/dL — ABNORMAL HIGH (ref 70–99)
Glucose-Capillary: 99 mg/dL (ref 70–99)
Glucose-Capillary: 136 mg/dL — ABNORMAL HIGH (ref 70–99)
Glucose-Capillary: 128 mg/dL — ABNORMAL HIGH (ref 70–99)

## 2021-09-01 LAB — PHOSPHORUS: Phosphorus: 3 mg/dL (ref 2.5–4.6)

## 2021-09-01 LAB — TSH: TSH: 1.819 u[IU]/mL (ref 0.350–4.500)

## 2021-09-01 LAB — MAGNESIUM: Magnesium: 2.1 mg/dL (ref 1.7–2.4)

## 2021-09-01 NOTE — Evaluation (Addendum)
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  CT abdomen and pelvis revealed findings compatible with progression of metastatic disease, new hepatic metastasis with numerous small pulmonary nodules, enlarged upper abdominal lymph nodes and progression of bone disease.  Innumerable hepatic lesions. Scattered bony lesions compatible with metastatic disease.  Enlargement of lytic expansile lesion involving the right iliac wing. He underwent L4 nerve root block and transforaminal epidural on 08/30/2021 for pain management. He was transfer to ICU 6/13 to monitor him on IV dilaudid.   Family would like to avoid IV dilaudid for pain management.   Clinical Impression   Bill Garza is a 61 year old man who presents with above medical history. On evaluation he is drowsy due to medication but does not report any pain. He presents on 3 L [place] that was not in his nose with o2 sat 94%. With short ambulation it dropped to 88% and patient limited by fatigue. On evaluation he is overall min assist for steadying and safety and needing increased assistance with ADLs. He was able to walk approx 8 feet with walker but needed assistance to don socks and limited to near bedside activity. He is limited by generalized weakness, decreased activity tolerance and impaired balance. Patient will benefit from skilled OT services while in hospital to improve deficits and learn compensatory strategies as needed in order to return to PLOF. Discussed potential DME needs at discharge with patient's wife.       Recommendations for follow up therapy are one component of a multi-disciplinary discharge planning process, led by the attending physician.  Recommendations may be updated based on patient status, additional functional criteria and insurance authorization.   Follow Up Recommendations  Home health OT    Assistance Recommended at Discharge Frequent or constant Supervision/Assistance  Patient can return home with the following A little help with walking and/or transfers;A little help with bathing/dressing/bathroom;Assistance with cooking/housework;Assist for transportation;Help with stairs or ramp for entrance;Direct supervision/assist for medications management;Direct supervision/assist for financial management    Functional Status Assessment  Patient has had a recent decline in their functional status and demonstrates the ability to make significant improvements in function in a reasonable and predictable amount of time.  Equipment Recommendations  Tub/shower seat    Recommendations for Other Services       Precautions / Restrictions Precautions Precautions: Fall Restrictions Weight Bearing Restrictions: No      Mobility Bed Mobility Overal bed mobility: Needs Assistance Bed Mobility: Supine to Sit, Sit to Supine     Supine to sit: Min assist, HOB elevated Sit to supine: Supervision        Transfers Overall transfer level: Needs assistance Equipment used: Rolling walker (2 wheels) Transfers: Sit to/from Stand Sit to Stand: Min assist           General transfer comment: MIn assist to ambulate approx 8 feet with walker. O2 sat 88% on RA. Recovred on 3L [place]      Balance Overall balance assessment: Mild deficits observed, not formally tested  ADL either performed or assessed with clinical judgement   ADL Overall ADL's : Needs assistance/impaired Eating/Feeding: Set up;Sitting   Grooming: Set up;Sitting    Upper Body Bathing: Minimal assistance;Sitting   Lower Body Bathing: Minimal assistance;Sit to/from stand   Upper Body Dressing : Set up;Sitting   Lower Body Dressing: Sit to/from stand;Moderate assistance   Toilet Transfer: Minimal assistance;BSC/3in1;Rolling walker (2 wheels)   Toileting- Clothing Manipulation and Hygiene: Minimal assistance;Sit to/from stand       Functional mobility during ADLs: Minimal assistance;Rolling walker (2 wheels)       Vision   Vision Assessment?: No apparent visual deficits     Perception     Praxis      Pertinent Vitals/Pain Pain Assessment Pain Assessment: No/denies pain     Hand Dominance     Extremity/Trunk Assessment Upper Extremity Assessment Upper Extremity Assessment: Overall WFL for tasks assessed   Lower Extremity Assessment Lower Extremity Assessment: Defer to PT evaluation   Cervical / Trunk Assessment Cervical / Trunk Assessment: Normal   Communication Communication Communication: No difficulties   Cognition Arousal/Alertness: Suspect due to medications Behavior During Therapy: WFL for tasks assessed/performed Overall Cognitive Status: Difficult to assess                                 General Comments: under the effects of Ativan but able to follow commands and cognition appeared functional     General Comments       Exercises     Shoulder Instructions      Home Living Family/patient expects to be discharged to:: Private residence Living Arrangements: Spouse/significant other Available Help at Discharge: Family Type of Home: House Home Access: Stairs to enter Technical brewer of Steps: 3 Entrance Stairs-Rails: Right;Left Home Layout: One level     Bathroom Shower/Tub: Teacher, early years/pre: Standard     Home Equipment: None          Prior Functioning/Environment Prior Level of Function : Independent/Modified Independent             Mobility  Comments: no device ADLs Comments: independent        OT Problem List: Decreased strength;Decreased activity tolerance;Impaired balance (sitting and/or standing);Decreased knowledge of use of DME or AE;Pain;Cardiopulmonary status limiting activity      OT Treatment/Interventions: Self-care/ADL training;Therapeutic exercise;DME and/or AE instruction;Therapeutic activities;Balance training;Patient/family education    OT Goals(Current goals can be found in the care plan section) Acute Rehab OT Goals Patient Stated Goal: do more OT Goal Formulation: With patient/family Time For Goal Achievement: 09/15/21 Potential to Achieve Goals: Good  OT Frequency: Min 2X/week    Co-evaluation              AM-PAC OT "6 Clicks" Daily Activity     Outcome Measure Help from another person eating meals?: A Little Help from another person taking care of personal grooming?: A Little Help from another person toileting, which includes using toliet, bedpan, or urinal?: A Little Help from another person bathing (including washing, rinsing, drying)?: A Little Help from another person to put on and taking off regular upper body clothing?: A Little Help from another person to put on and taking off regular lower body clothing?: A Lot 6 Click Score: 17   End of Session Equipment Utilized During Treatment: Rolling walker (2 wheels) Nurse Communication: Mobility status  Activity Tolerance: Patient tolerated treatment well Patient left: in bed;with call  bell/phone within reach;with family/visitor present  OT Visit Diagnosis: Muscle weakness (generalized) (M62.81);Unsteadiness on feet (R26.81)                Time: 5374-8270 OT Time Calculation (min): 14 min Charges:  OT General Charges $OT Visit: 1 Visit OT Evaluation $OT Eval Low Complexity: 1 Low  Elisha Cooksey, OTR/L [place] [phone] Pager: [place] 09/01/2021, 3:42 PM

## 2021-09-01 NOTE — Progress Notes (Signed)
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  CT abdomen and pelvis revealed findings compatible with progression of metastatic disease, new hepatic metastasis with numerous small pulmonary nodules, enlarged upper abdominal lymph nodes and progression of bone disease.  Innumerable hepatic lesions.  Thrombosed right portal vein.  Gallbladder is mildly distended.  Scattered bony lesions compatible with metastatic disease.  Enlargement of lytic expansile lesion involving the right iliac wing.  Patient was a started on heparin drip for new portal vein thrombosis.  He underwent L4 nerve root block and transforaminal epidural on 08/30/2021.  He was transition from heparin to Xarelto on 08/31/2021.  He was transfer to ICU 6/13 to monitor him on IV dilaudid.  Family would like to avoid IV dilaudid for pain management.  Plan to transfer patient to med-surgery. Avoid IV dilaudid.     Assessment & Plan:   Principal Problem:   Intractable pain Active Problems:   Non-small cell lung cancer metastatic to brain [facility])   Secondary malignant neoplasm of bone and bone marrow (HCC)   DM2 (diabetes mellitus, type 2) (HCC)   Depression   COPD (chronic obstructive pulmonary disease) (HCC)   Tobacco abuse   Elevated alkaline phosphatase level   SIRS (systemic inflammatory response syndrome) (HCC)   Normocytic anemia   Hyponatremia   Anxiety   Generalized pain   General weakness   Constipation   1-Back pain: L4 radiculopathy,  -Right extra foraminal disc protrusion contacts and potentially irritates  the exiting right L4 nerve root. -Started on Prednisone 6/13. Continue.  -Plan to avoid IV Dilaudid.  -continue with methadone and Oxycodone.  -Defer to palliative care pain management.    2-Non-small cell lung cancer with metastasis to the brain, liver, lungs, spine, hip: -CT abdomen pelvis: Imaging findings are compatible with progression of metastatic disease. New hepatic metastasis with new small pulmonary nodules, enlarged upper abdominal lymph nodes and progression of bone disease. -MRI Lumbar/thoracic: New and progressive thoracic and lumbosacral osseous metastatic disease without significant extraosseous extension or pathologic fracture. Enlarging bilateral iliac bone and upper abdominal nodal fluid metastatic disease.  Both 1 uUnchanged multilevel lumbar spondylosis as described above. Right extra-foraminal disc protrusion contacts and potentially irritates the exiting right L4 nerve root. -Follow by Dr Marin Olp.   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 He has been transition to [place]. Continue.    Tachycardia: TSH normal . Resolved.   Transaminases; elevation Alk Phosphatase. Suspect related to bone metastasis.    Non anion gap metabolic acidosis; Resolved.   DM type 2 Hyperglycemia. SSI  Chronic anxiety/Depression: On ativan PRN.   Severe protein malnutrition. Continue with ensure.   COPD; Acute hypoxic resp failure.  PRN Nebulizer.  Placed on oxygen while on PCA pump/.  Wean off oxygen as tolerated.  Incentive spirometry.   Elevation BP;  On IV labetalol PRN.   Normocytic anemia: Hb stable.   Hyponatremia; Resolved.   Leukocytosis; Monitor. On steroids.   Bilateral lower extremity edema: Suspect related to hypoalbuminemia.  Will order TED hose.  Received a dose of IV Lasix   Nutrition Problem: Increased nutrient needs Etiology: cancer and cancer  related treatments    Signs/Symptoms: estimated needs    Interventions: Ensure Enlive (each  supplement provides 350kcal and 20 grams of protein), MVI, Education  Estimated body mass index is 21.56 kg/m as calculated from the following:   Height as of this encounter: 6' (1.829 m).   Weight as of this encounter: 72.1 kg.   DVT prophylaxis: Xarelto Code Status: Full code Family Communication: Family at bedside  Disposition Plan:  Status is: Inpatient Remains inpatient appropriate because: management of pain     Consultants:  Dr Marin Olp  Procedures:    Antimicrobials:    Subjective: He is alert, sitting side of bed. He report mild back pain. He moans some times. He is able to moves all 4 extremities, neuro exam non focal.  Family with multiples concern about events from yesterday in regards foley catheter and IV pain medications.  Of note patient did received Narcan one time in the hospital on 6/09/  Patient has been moving his bowel.    Objective: Vitals:   09/01/21 0300 09/01/21 0400 09/01/21 0500 09/01/21 0600  BP: (!) 165/102 (!) 153/99 (!) 110/58 (!) 129/55  Pulse: 95 76 94 92  Resp: _0 Temp:      TempSrc:      SpO2: 96% 98% 96% 91%  Weight:      Height:        Intake/Output Summary (Last 24 hours) at 09/01/2021 0726 Last data filed at 09/01/2021 0600 Gross per 24 hour  Intake --  Output 1700 ml  Net -1700 ml   Filed Weights   08/25/21 0809  Weight: 72.1 kg    Examination:  General exam: Appears calm and comfortable  Respiratory system: Clear to auscultation. Respiratory effort normal. Cardiovascular system: S1 & S2 heard, RRR. No JVD, murmurs, rubs, gallops or clicks. No pedal edema. Gastrointestinal system: Abdomen is nondistended, soft and nontender. No organomegaly or masses felt. Normal bowel sounds heard. Central nervous system: Alert and oriented. No focal neurological deficits. Extremities: Symmetric 5 x 5 power. Skin: No rashes, lesions or ulcers     Data Reviewed: I have personally reviewed following labs and imaging  studies  CBC: Recent Labs  Lab 08/25/21 0812 08/26/21 0532 08/28/21 0601 08/29/21 0616 08/30/21 0519 08/31/21 0503 09/01/21 0323  WBC 12.8*   < > 13.8* 12.2* 10.7* 12.6* 12.3*  NEUTROABS 10.4*  --   --   --   --   --  9.1*  HGB 12.0*   < > 10.5* 10.4* 10.6* 10.7* 10.9*  HCT 36.3*   < > 32.7* 32.7* 32.9* 32.3* 33.4*  MCV 91.2   < > 94.5 95.3 94.8 93.9 94.4  PLT 591*   < > 446* 468* 475* 486* 458*   < > = values in this interval not displayed.   Basic Metabolic Panel: Recent Labs  Lab 08/28/21 0601 08/29/21 0616 08/30/21 0519 08/31/21 0503 09/01/21 0323  NA 137 138 136 136 136  K 4.2 4.4 4.2 4.6 4.0  CL 108 107 101 101 99  CO2 21* _1 GLUCOSE 210* 190* 172* 147* 113*  BUN _2 CREATININE 0.52* 0.51* 0.52* 0.57* 0.61  CALCIUM 8.4* 8.6* 8.7* 8.9 8.9  MG  --   --   --   --  2.1  PHOS  --   --   --   --  3.0   GFR: Estimated Creatinine Clearance: 102.6 mL/min (by C-G formula based  on SCr of 0.61 mg/dL). Liver Function Tests: Recent Labs  Lab 08/28/21 0601 08/29/21 0616 08/30/21 0519 08/31/21 0503 09/01/21 0323  AST 55* 63* 49* 42* 39  ALT 43 52* 48* 42 39  ALKPHOS 904* 858* 746* 758* 765*  BILITOT 0.5 0.4 1.1 1.0 0.9  PROT 5.9* 6.0* 6.1* 6.5 6.4*  ALBUMIN 2.5* 2.6* 2.7* 2.7* 2.9*   Recent Labs  Lab 08/25/21 0812  LIPASE 19   No results for input(s): "AMMONIA" in the last 168 hours. Coagulation Profile: Recent Labs  Lab 08/25/21 0816  INR 1.6*   Cardiac Enzymes: No results for input(s): "CKTOTAL", "CKMB", "CKMBINDEX", "TROPONINI" in the last 168 hours. BNP (last 3 results) No results for input(s): "PROBNP" in the last 8760 hours. HbA1C: No results for input(s): "HGBA1C" in the last 72 hours. CBG: Recent Labs  Lab 08/30/21 2209 08/31/21 0807 08/31/21 1231 08/31/21 1834 08/31/21 2123  GLUCAP 119* 143* 166* 131* 108*   Lipid Profile: No results for input(s): "CHOL", "HDL", "LDLCALC", "TRIG", "CHOLHDL", "LDLDIRECT" in the last  72 hours. Thyroid Function Tests: Recent Labs    09/01/21 0323  TSH 1.819   Anemia Panel: No results for input(s): "VITAMINB12", "FOLATE", "FERRITIN", "TIBC", "IRON", "RETICCTPCT" in the last 72 hours. Sepsis Labs: No results for input(s): "PROCALCITON", "LATICACIDVEN" in the last 168 hours.  Recent Results (from the past 240 hour(s))  Culture, blood (Routine X 2) w Reflex to ID Panel     Status: None   Collection Time: 08/25/21  3:01 PM   Specimen: Right Antecubital; Blood  Result Value Ref Range Status   Specimen Description   Final    RIGHT ANTECUBITAL BLOOD Performed at [facility], [address] [place], [place] [address]    Special Requests   Final    Blood Culture adequate volume BOTTLES DRAWN AEROBIC AND ANAEROBIC Performed at [facility], [address] [place], [place] [address]    Culture   Final    NO GROWTH 5 DAYS Performed at [facility], [place] [address], [place], [place] [address]    Report Status 08/30/2021 FINAL  Final  Culture, blood (Routine X 2) w Reflex to ID Panel     Status: Abnormal   Collection Time: 08/25/21  5:30 PM   Specimen: BLOOD  Result Value Ref Range Status   Specimen Description BLOOD SITE NOT SPECIFIED  Final   Special Requests IN PEDIATRIC BOTTLE Blood Culture adequate volume  Final   Culture  Setup Time   Final    GRAM POSITIVE COCCI IN CLUSTERS IN PEDIATRIC BOTTLE CRITICAL RESULT CALLED TO, READ BACK BY AND VERIFIED WITH: L POINDEXTER,[email or web address]_[email or web address]  08/26/21 [place]    Culture (A)  Final    STAPHYLOCOCCUS EPIDERMIDIS THE SIGNIFICANCE OF ISOLATING THIS ORGANISM FROM A SINGLE VENIPUNCTURE CANNOT BE PREDICTED WITHOUT FURTHER CLINICAL AND CULTURE CORRELATION. SUSCEPTIBILITIES AVAILABLE ONLY ON REQUEST. Performed at [facility], [place] [address], [place], [place] [address]    Report Status 08/28/2021 FINAL  Final  Blood Culture ID Panel (Reflexed)     Status: Abnormal   Collection Time: 08/25/21  5:30 PM   Result Value Ref Range Status   Enterococcus faecalis NOT DETECTED NOT DETECTED Final   Enterococcus Faecium NOT DETECTED NOT DETECTED Final   Listeria monocytogenes NOT DETECTED NOT DETECTED Final   Staphylococcus species DETECTED (A) NOT DETECTED Final    Comment: CRITICAL RESULT CALLED TO, READ BACK BY AND VERIFIED WITH: L POINDEXTER,[email or web address]_1  08/26/21 [place]  Staphylococcus aureus (BCID) NOT DETECTED NOT DETECTED Final   Staphylococcus epidermidis DETECTED (A) NOT DETECTED Final    Comment: CRITICAL RESULT CALLED TO, READ BACK BY AND VERIFIED WITH: L POINDEXTER,PHARMD_0  08/26/21 [place]    Staphylococcus lugdunensis NOT DETECTED NOT DETECTED Final   Streptococcus species NOT DETECTED NOT DETECTED Final   Streptococcus agalactiae NOT DETECTED NOT DETECTED Final   Streptococcus pneumoniae NOT DETECTED NOT DETECTED Final   Streptococcus pyogenes NOT DETECTED NOT DETECTED Final   A.calcoaceticus-baumannii NOT DETECTED NOT DETECTED Final   Bacteroides fragilis NOT DETECTED NOT DETECTED Final   Enterobacterales NOT DETECTED NOT DETECTED Final   Enterobacter cloacae complex NOT DETECTED NOT DETECTED Final   Escherichia coli NOT DETECTED NOT DETECTED Final   Klebsiella aerogenes NOT DETECTED NOT DETECTED Final   Klebsiella oxytoca NOT DETECTED NOT DETECTED Final   Klebsiella pneumoniae NOT DETECTED NOT DETECTED Final   Proteus species NOT DETECTED NOT DETECTED Final   Salmonella species NOT DETECTED NOT DETECTED Final   Serratia marcescens NOT DETECTED NOT DETECTED Final   Haemophilus influenzae NOT DETECTED NOT DETECTED Final   Neisseria meningitidis NOT DETECTED NOT DETECTED Final   Pseudomonas aeruginosa NOT DETECTED NOT DETECTED Final   Stenotrophomonas maltophilia NOT DETECTED NOT DETECTED Final   Candida albicans NOT DETECTED NOT DETECTED Final   Candida auris NOT DETECTED NOT DETECTED Final   Candida glabrata NOT DETECTED NOT DETECTED Final   Candida krusei NOT DETECTED NOT  DETECTED Final   Candida parapsilosis NOT DETECTED NOT DETECTED Final   Candida tropicalis NOT DETECTED NOT DETECTED Final   Cryptococcus neoformans/gattii NOT DETECTED NOT DETECTED Final   Methicillin resistance mecA/C NOT DETECTED NOT DETECTED Final    Comment: Performed at [facility], [address], [place], [place] [address]         Radiology Studies: IR Fluoro Guide Ndl Plmt / BX  Result Date: 08/30/2021 CLINICAL DATA:  61 year old male with L4-L5 extraforaminal disc extrusion contacting the right L4 nerve root and associated with right L4 radiculopathy. In-patient nerve root block for symptom relief. EXAM: IR INJECT/THERA/INC NEEDLE/CATH/PLC EPI/LUMB/SAC W/IMG FLUOROSCOPY TIME:  Radiation exposure index: 14 mGy PROCEDURE: The procedure, risks, benefits, and alternatives were explained to the patient. Questions regarding the procedure were encouraged and answered. The patient understands and consents to the procedure. RIGHT L4 NERVE ROOT BLOCK AND TRANSFORAMINAL EPIDURAL: A posterior oblique approach was taken to the intervertebral foramen on the right at L4 using a curved 22 gauge spinal needle. Injection of Omnipaque 180 outlined the L4 nerve root and showed good epidural spread. No vascular opacification is seen. 80 mg of Depo-Medrol mixed with 2 mL 1% lidocaine were instilled. The procedure was well-tolerated, and the patient was discharged thirty minutes following the injection in good condition. COMPLICATIONS: None IMPRESSION: Technically successful injection consisting of a right L4 nerve root block and transforaminal epidural. Electronically Signed   By: [name] M.D.   On: 08/30/2021 14:47        Scheduled Meds:  Chlorhexidine Gluconate Cloth  6 each Topical Daily   dronabinol  2.5 mg Oral TID AC   escitalopram  5 mg Oral Daily   feeding supplement  237 mL Oral BID BM   insulin aspart  0-15 Units Subcutaneous TID WC   lactulose  13.3333-20 g Oral BID    mouth rinse  15 mL Mouth Rinse BID   methadone  10 mg Oral Q12H   metoCLOPramide (REGLAN) injection  5 mg Intravenous Q6H  mometasone-formoterol  2 puff Inhalation BID   montelukast  10 mg Oral QHS   multivitamin with minerals  1 tablet Oral Daily   pantoprazole  40 mg Oral BID   predniSONE  20 mg Oral Q breakfast   Rivaroxaban  15 mg Oral BID WC   Followed by   Derrill Memo ON 09/21/2021] rivaroxaban  20 mg Oral Q supper   senna-docusate  2 tablet Oral BID   traZODone  50 mg Oral QHS   Continuous Infusions:   LOS: 6 days    Time spent: 35 minutes/     Lylianna Fraiser A Ardyth Kelso, MD [facility]   If 7PM-7AM, please contact night-coverage [email or web address]  09/01/2021, 7:26 AM

## 2021-09-01 NOTE — Progress Notes (Signed)
RN called to room by patient who was complaining of 9/10 back pain. Pt had been laying in bed moaning from pain but refusing pain medication until this time. RN offered PO pain medicine but patient refused and requested IV dilaudid. IV dilaudid given to patient. Pt's son was at the bedside and upset that patient is receiving IV pain medicine and states "they're going to kill you with pain medicine." Pt is alert and oriented, VS stable at this time, will continue to monitor.

## 2021-09-01 NOTE — Progress Notes (Signed)
Apparently, there was a lot of issues yesterday.  He now is down in the ICU.  I am not sure exactly how he ended up in the ICU.  I do not think he has to be in the I see you.  It would be nice to get him out of the ICU and try to get him up onto 6 E.  To clarify, he DOES NOT have chronic cancer pain.  The pain in his back is from a nerve that is being pinched by a disc.  There is no signs of cancer that is causing this pain.  We know he does have malignancy and metastasis to his liver.  This however is not causing any pain for him.  He, unfortunately, is not felt to be a surgical candidate to help with the nerve root being compressed.  That was the point of doing the nerve block.  I think the nerve block has helped.  As such, maybe we can see about doing RFA.  This is an outpatient procedure.  We are trying to avoid giving him IV Dilaudid.  I just would like to have him on oral pain medication.  He really needs to get out of bed.  He really needs to be more active.  I know that he has help from his family to try to get him to be more active and to be out of bed more.  I really thought that he was making some nice progress.  I know that he has challenges.  It sounds like he did not want to have a catheter.  I would tend to agree with this.  I would think that a catheter would increase his risk of infection which is lasting that he would need.  I know that he would like to go home.  In talking with his wife, I am not sure if she is able to care for him at home right now.  Our goal is to get him home.  I know she would want him to be at home.  His labs show a BUN of 13 creatinine 0.61.  Calcium 8.9 with an albumin of 2.9.  His white cell count is 12.3.  Hemoglobin 10.9.  Platelet count 458,000.  Again, I think that the nerve block did seem to help him.  I think when he does get upset, he might "default" to wanting some pain medication.  When I Asked him this morning if he is having any pain, he told  me that he was not.  Again, we really need to get him out of the ICU.  If we can get him up to the 6 floor that would be nice.  I know that this is incredibly challenging for everybody.  Our primary goal is to help Bill Garza his quality of life.  I think any challenges to what he would like might tend to cause him to "default" back to wanting some pain medication.  We really have to try to minimize this if possible.  Again, I just want his quality of life to improve.  I know that he would, mentally, do okay at home.  I know you says he wants to go home.  I talked him again about the fact that we just may not be able to treat the actual cancer if we cannot get him stronger.  I think the best way to try to get him stronger is to try to get him home where he might have a  more comfortable surrounding.  I think he might be able to eat a little bit better at home.  Nutrition is certainly going to be essential.  Again, his pain is not reflective of cancer pain but of nerve root pain from a disc and some degenerative changes.  I will keep praying about this.  Lattie Haw, MD  Romans 5:3-5

## 2021-09-01 NOTE — Progress Notes (Signed)
Nutrition Follow-up  DOCUMENTATION CODES:   Non-severe (moderate) malnutrition in context of chronic illness  INTERVENTION:   -Ensure Plus High Protein po BID, each supplement provides 350 kcal and 20 grams of protein.   -Encouraged family to bring food items pt enjoys to hospital  -Multivitamin with minerals daily   NEW NUTRITION DIAGNOSIS:   Moderate Malnutrition related to chronic illness, cancer and cancer related treatments as evidenced by moderate fat depletion, moderate muscle depletion, percent weight loss.  GOAL:   Patient will meet greater than or equal to 90% of their needs  Not progressing.  MONITOR:   PO intake, Supplement acceptance, Labs, Weight trends, I & O's  REASON FOR ASSESSMENT:   Consult Assessment of nutrition requirement/status  ASSESSMENT:   61 year old male with a past medical history significant for metastatic lung cancer with mets to the brain, liver, spine, hip, chronic pain, tobacco dependence, diabetes mellitus, COPD, anxiety/depression, GERD.  He presented to the emergency department with back and abdominal pain.  6/12: s/p nerve root block with transforaminal injection  Patient in room, wife at bedside. Pt just received some ativan per wife. States he has not had a good day today and has refused medications and to eat. Ate a good breakfast yesterday and drank some of a milkshake his wife brought him.  Pt seemed calmer during RD visit. Agreed to try an Ensure later today. Staff getting him an icee to eat at this time.  Per wife, pt was eating well PTA.  Currently awaiting transfer to [place].  Admission weight: 159 lbs. Per weight records, pt has lost 14 lbs since 4/10 (8% wt loss x 2 months, significant for time frame).   Medications: Marinol, Lactulose, Senokot, Zofran  Labs reviewed:   CBGs: 99-131  NUTRITION - FOCUSED PHYSICAL EXAM:  Flowsheet Row Most Recent Value  Orbital Region Moderate depletion  Upper Arm Region Severe  depletion  Thoracic and Lumbar Region Unable to assess  Buccal Region Moderate depletion  Temple Region Moderate depletion  Clavicle Bone Region Moderate depletion  Clavicle and Acromion Bone Region Moderate depletion  Scapular Bone Region Moderate depletion  Dorsal Hand Moderate depletion  Patellar Region Mild depletion  Anterior Thigh Region Mild depletion  Posterior Calf Region Moderate depletion  Edema (RD Assessment) Moderate  [BLEs]  Hair Reviewed  Eyes Unable to assess  [kept eyes shut]  Mouth Reviewed  Skin Reviewed       Diet Order:   Diet Order             Diet regular Room service appropriate? Yes; Fluid consistency: Thin  Diet effective now                   EDUCATION NEEDS:   Education needs have been addressed  Skin:  Skin Assessment: Reviewed RN Assessment  Last BM:  6/12  Height:   Ht Readings from Last 1 Encounters:  08/25/21 6' (1.829 m)    Weight:   Wt Readings from Last 1 Encounters:  08/25/21 72.1 kg    BMI:  Body mass index is 21.56 kg/m.  Estimated Nutritional Needs:   Kcal:  2000-2200  Protein:  100-110g  Fluid:  2L/day  Clayton Bibles, MS, RD, LDN Inpatient Clinical Dietitian Contact information available via Amion

## 2021-09-01 NOTE — Progress Notes (Signed)
Daily Progress Note   Patient Name: Bill Garza       Date: 09/01/2021 DOB: 1960-10-17  Age: 61 y.o. MRN#: [id number] Attending Physician: Elmarie Shiley, MD Primary Care Physician: Aleen Campi, NP Admit Date: 08/25/2021  Reason for Consultation/Follow-up: Pain control  Subjective: Chart reviewed. Updates received. Patient examined at bedside.   Mr. Mottola is awake, alert, and oriented. Denies pain at this time. States he feels "good today". Appears comfortable. No distress noted. Wife is at bedside. He has not eaten much today. Says he is not eating until he moves upstairs and out of ICU. Aware pending bed situations this may not occur immediately (maybe days). Encouraged him to try and increase nutritional intake. Wife also offering foods of his choice.   We discussed ongoing goal of improving quality of life with close monitoring of pain and other manageable symptoms to allow him to do as good as he can for as long as he can. He is hopeful he will be able to go home soon. PT/OT has been ordered. Able to sit on side of bed today with minimal assistance and no major pain concerns.   Education provided on pain regimen. We will continue to closely monitor and manage appropriately with oral medications.   All question answered and support provided.   Length of Stay: 6  Current Medications: Scheduled Meds:   Chlorhexidine Gluconate Cloth  6 each Topical Daily   dronabinol  2.5 mg Oral TID AC   escitalopram  5 mg Oral Daily   feeding supplement  237 mL Oral BID BM   insulin aspart  0-15 Units Subcutaneous TID WC   lactulose  13.3333-20 g Oral BID   mouth rinse  15 mL Mouth Rinse BID   methadone  10 mg Oral Q12H   metoCLOPramide (REGLAN) injection  5 mg Intravenous Q6H    mometasone-formoterol  2 puff Inhalation BID   montelukast  10 mg Oral QHS   multivitamin with minerals  1 tablet Oral Daily   pantoprazole  40 mg Oral BID   predniSONE  20 mg Oral Q breakfast   Rivaroxaban  15 mg Oral BID WC   Followed by   Derrill Memo ON 09/21/2021] rivaroxaban  20 mg Oral Q supper   senna-docusate  2 tablet Oral BID   traZODone  50 mg Oral QHS    Continuous Infusions:    PRN Meds: albuterol, cyclobenzaprine, iopamidol, labetalol, lidocaine (PF), LORazepam, methylPREDNISolone acetate, naLOXone (NARCAN)  injection, ondansetron (ZOFRAN) IV, oxyCODONE, polyethylene glycol  Physical Exam         Awake alert oriented, frail, ill appearing  Normal breathing pattern  Generalized weakness  Mood appropriate, AAOx3  Vital Signs: BP 128/67   Pulse 74   Temp 97.6 F (36.4 C) (Oral)   Resp 20   Ht 6' (1.829 m)   Wt 72.1 kg   SpO2 95%   BMI 21.56 kg/m  SpO2: SpO2: 95 % O2 Device: O2 Device: Nasal Cannula O2 Flow Rate: O2 Flow Rate (L/min): 4 L/min  Intake/output summary:  Intake/Output Summary (Last 24 hours) at 09/01/2021 1147 Last data filed at 09/01/2021 0800 Gross per 24 hour  Intake --  Output 2000 ml  Net -2000 ml    LBM: Last BM Date : 08/30/21 Baseline Weight: Weight: 72.1 kg Most recent weight: Weight: 72.1 kg       Palliative Assessment/Data:      Patient Active Problem List   Diagnosis Date Noted   Generalized pain    General weakness    Constipation    Intractable pain 08/25/2021   SIRS (systemic inflammatory response syndrome) (HCC) 08/25/2021   Normocytic anemia 08/25/2021   Hyponatremia 08/25/2021   Anxiety 08/25/2021   Low back pain 07/30/2021   HTN (hypertension) 07/30/2021   HLD (hyperlipidemia) 07/30/2021   DM2 (diabetes mellitus, type 2) (HCC) 07/30/2021   Gastroparesis 07/30/2021   Chronic constipation 07/30/2021   Depression 07/30/2021   COPD (chronic obstructive pulmonary disease) (HCC) 07/30/2021   Tobacco abuse  07/30/2021   GERD (gastroesophageal reflux disease) 07/30/2021   Elevated alkaline phosphatase level 07/30/2021   Secondary malignant neoplasm of bone and bone marrow ([place]) 07/13/2021   Malignant neoplasm metastatic to brain ([place]) 03/09/2021   Non-small cell lung cancer metastatic to brain ([place]) 01/27/2021   Goals of care, counseling/discussion 01/27/2021    Palliative Care Assessment & Plan   Patient Profile:    Assessment: 61 year old gentleman who lives at home with his wife in [place], [place], history of non-small cell lung cancer with mets to brain/liver/spine/hip, history of chronic pain tobacco use diabetes COPD anxiety/depression and GERD. Patient admitted to hospital medicine service for intractable back pain and abdominal pain with imaging showing new portal vein thrombosis, started on anticoagulation, oncology colleagues following, MR lumbar thoracic spine showing new and progressive thoracic and lumbosacral osseous metastatic disease burden, enlarging bilateral iliac bone and upper abdominal nodal metastatic disease.  Also has multilevel lumbar spondylosis. Palliative medicine team following for pain management.  Recommendations/Plan:  Nerve block on 6/12. Tolerated well. Pain is much improved post procedure. Denies back pain. Complains of left hip pain.  Wife is requesting medications be weaned to allow for improvement while also effectively managing pain. She does not wish for him to hurt.  Will continue to closely monitor.  Discontinued PCA on 6/13  Methadone 10 mg every 12 hours Oxy IR 10 mg every 4 hours as needed for breakthrough pain (4 doses over past 24 hours) Ativan every 8 hrs as needed for anxiety Zofran as needed for nausea Flexeril three times daily as needed  Palliative will continue to support and follow as needed.   Goals of Care and Additional Recommendations: Limitations on Scope of Treatment: Full Scope Treatment  Code Status:    Code  Status Orders  (From admission, onward)  Start     Ordered   08/25/21 1527  Full code  Continuous        08/25/21 1526         Prognosis:  Unable to determine  Discharge Planning: To Be Determined  Care plan was discussed with  patient.   Thank you for allowing the Palliative Medicine Team to assist in the care of this patient.   Billing based on MDM: High   Problems Addressed: One acute or chronic illness or injury with exacerbation, progression, cancer, and pain.    Amount and/or Complexity of Data: Category 3:Discussion of management or test interpretation with external physician/other qualified health care professional/appropriate source (not separately reported). Any controlled substances utilized were prescribed in the context of palliative care.   Risks: Controlled substances.   Time Total: 20 min   Visit consisted of counseling and education dealing with the complex and emotionally intense issues of symptom management and palliative care in the setting of serious and potentially life-threatening illness.Greater than 50%  of this time was spent counseling and coordinating care related to the above assessment and plan.  Alda Lea, AGPCNP-BC  Palliative Medicine Team/[facility]

## 2021-09-01 NOTE — TOC Progression Note (Signed)
Transition of Care [facility]) - Progression Note    Patient Details  Name: Renso Swett MRN: [id number] Date of Birth: 1960/11/13  Transition of Care [facility]) CM/SW Contact  Leeroy Cha, RN Phone Number: 09/01/2021, 7:10 AM  Clinical Narrative:    Patient progressing, hope is to return home with palliative care that is already in place.  Following for toc needs.     Barriers to Discharge: Continued Medical Work up  Expected Discharge Plan and Services                           DME Arranged: N/A                     Social Determinants of Health (SDOH) Interventions    Readmission Risk Interventions    08/27/2021    3:03 PM  Readmission Risk Prevention Plan  Transportation Screening Complete  PCP or Specialist Appt within 3-5 Days Complete  HRI or [place] Complete  Social Work Consult for [place] Planning/Counseling Complete  Palliative Care Screening Complete  Medication Review [employer]) Complete

## 2021-09-02 ENCOUNTER — Encounter: Payer: Self-pay | Admitting: Hematology & Oncology

## 2021-09-02 ENCOUNTER — Inpatient Hospital Stay (HOSPITAL_COMMUNITY): Payer: Medicare HMO

## 2021-09-02 DIAGNOSIS — R52 Pain, unspecified: Secondary | ICD-10-CM | POA: Diagnosis not present

## 2021-09-02 LAB — CBC
HCT: 36.8 % — ABNORMAL LOW (ref 39.0–52.0)
Hemoglobin: 12 g/dL — ABNORMAL LOW (ref 13.0–17.0)
MCH: 31.1 pg (ref 26.0–34.0)
MCHC: 32.6 g/dL (ref 30.0–36.0)
MCV: 95.3 fL (ref 80.0–100.0)
Platelets: 481 10*3/uL — ABNORMAL HIGH (ref 150–400)
RBC: 3.86 MIL/uL — ABNORMAL LOW (ref 4.22–5.81)
RDW: 16.2 % — ABNORMAL HIGH (ref 11.5–15.5)
WBC: 12.4 10*3/uL — ABNORMAL HIGH (ref 4.0–10.5)
nRBC: 0 % (ref 0.0–0.2)

## 2021-09-02 LAB — GLUCOSE, CAPILLARY
Glucose-Capillary: 130 mg/dL — ABNORMAL HIGH (ref 70–99)
Glucose-Capillary: 119 mg/dL — ABNORMAL HIGH (ref 70–99)
Glucose-Capillary: 188 mg/dL — ABNORMAL HIGH (ref 70–99)
Glucose-Capillary: 154 mg/dL — ABNORMAL HIGH (ref 70–99)

## 2021-09-02 IMAGING — DX DG CHEST 2V
2 series · 2 of 2 positions shown · non-contrast
Comparison: [DATE].

CLINICAL DATA: Hypoxemia.

EXAM:
CHEST - 2 VIEW

[chest lat]
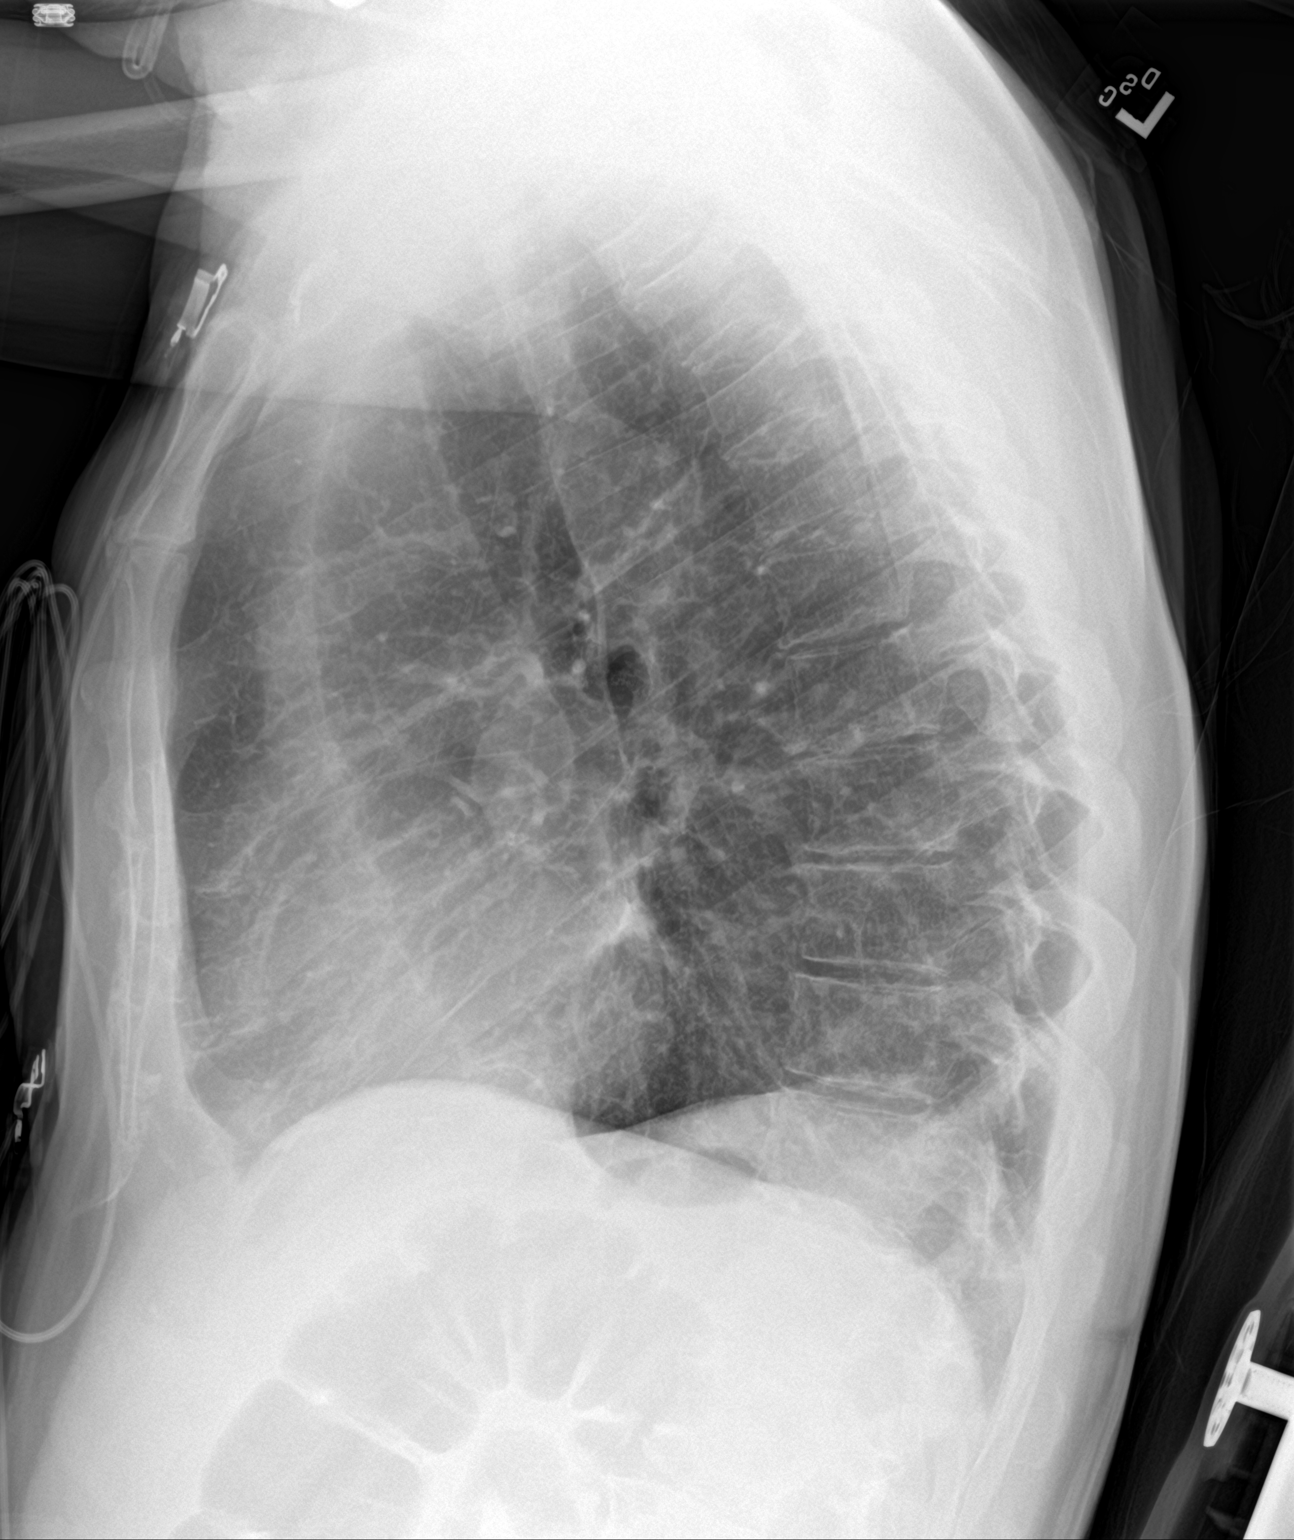

[chest ap]
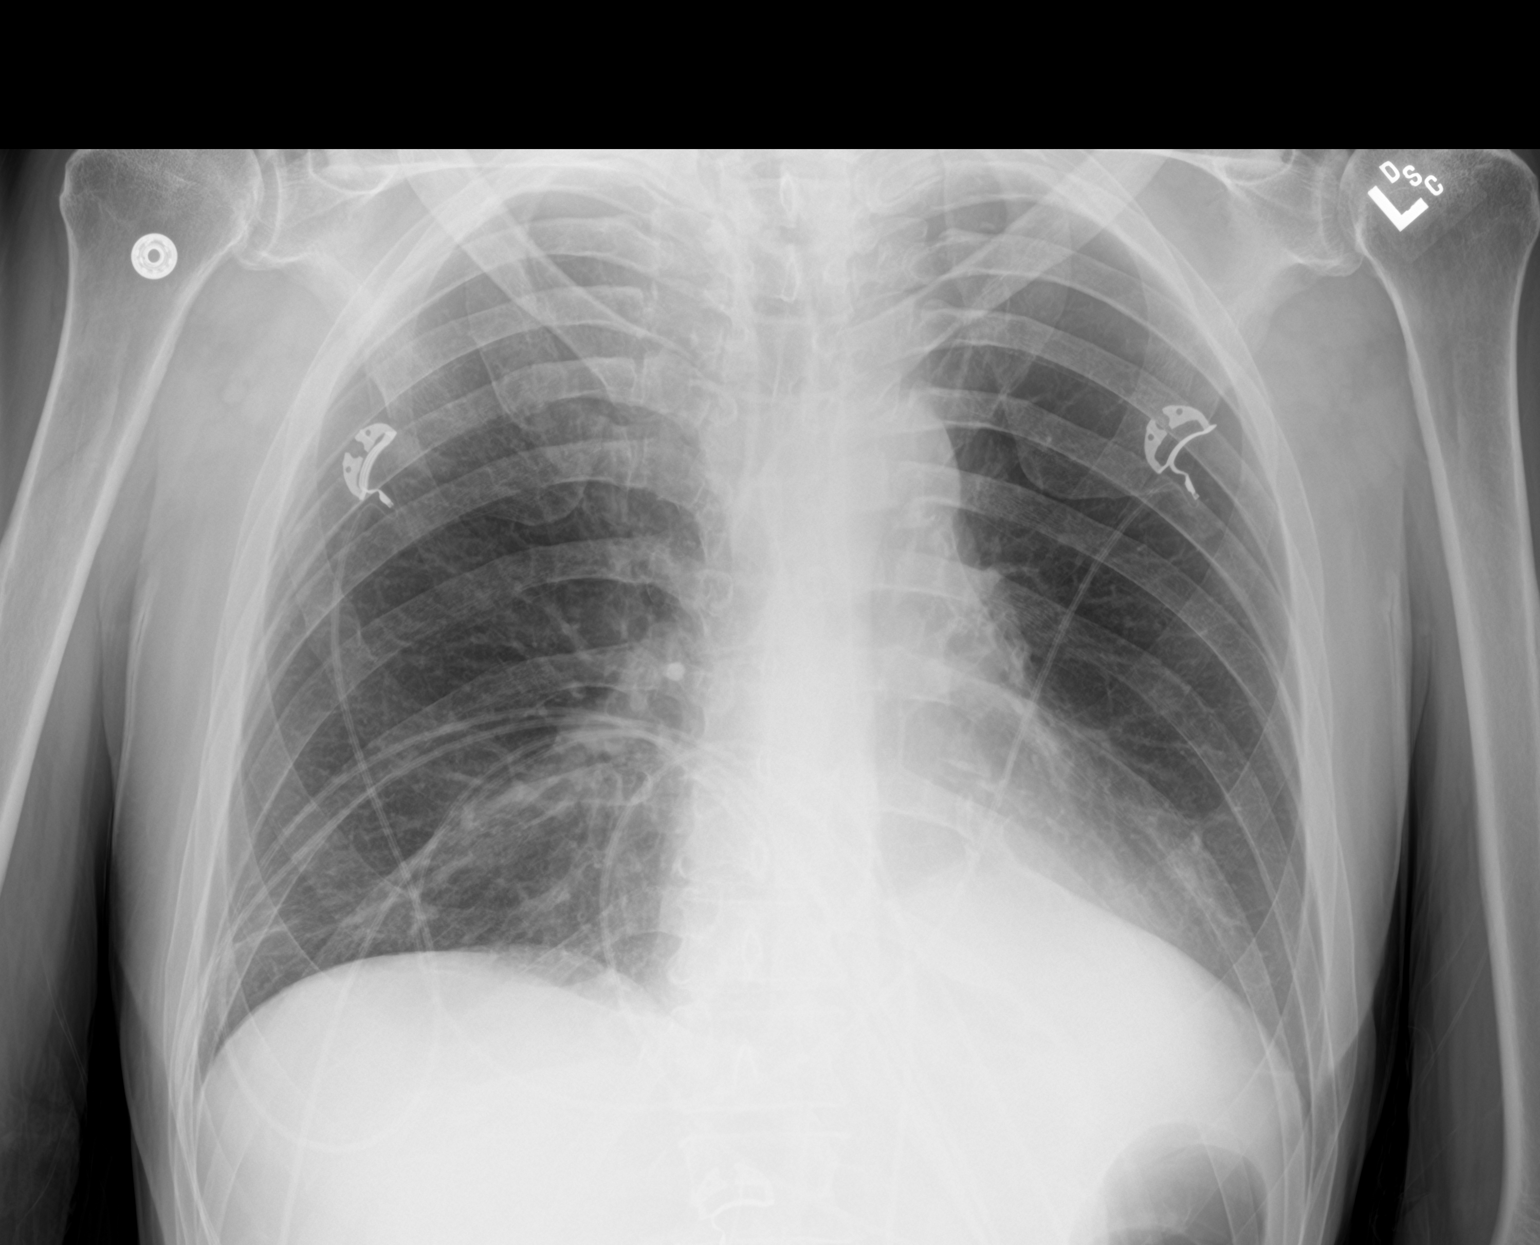

[2 of 2 positions shown; findings below may reference images not displayed]

FINDINGS: The heart size and mediastinal contours are within normal limits.
Both lungs are clear. The visualized skeletal structures are
unremarkable.
IMPRESSION: No active cardiopulmonary disease.

## 2021-09-02 MED ORDER — BOOST / RESOURCE BREEZE PO LIQD CUSTOM
1.0000 | Freq: Three times a day (TID) | ORAL | Status: DC
  Administered 2021-09-02 – 2021-09-03 (×2): 1 via ORAL

## 2021-09-02 NOTE — Progress Notes (Signed)
Patient Details Name: Bill Garza MRN: [id number] DOB: 05-29-1960   SATURATION QUALIFICATIONS: (This note is used to comply with regulatory documentation for home oxygen)  Patient Saturations on Room Air at Rest = 98%  Patient Saturations on Room Air while Ambulating = 85%  Patient Saturations on 2 Liters of oxygen while Ambulating = 95%  Please briefly explain why patient needs home oxygen: to improve oxygen saturations during physical activities such as ambulation   [place] 09/02/2021, 10:02 AM Arlyce Dice, DPT Acute Rehabilitation Services Pager: [phone] Office: [phone]

## 2021-09-02 NOTE — Progress Notes (Signed)
Mr. Bill Garza is on 6 E. now.  I am very impressed with how well he looks this morning.  He looks a whole lot better.  He is seen upon the side of the bed.  He is alert.  He says he is not hurting.  He does stand up.  Hopefully, he is on a good regimen for pain now.  I had to believe that the nerve block that he had has helped.  As such, we will see about doing the RFA as an outpatient.  Nutrition is clearly in to be important for him.  We really have to get his nutritional intake low but better.  We need to get good calories and protein into him if we are going to try to treat this cancer.  He has had no cough.  He has had no nausea or vomiting.  He has had no bleeding.  I know he was still like to go home.  It would really be nice for him to try to go home.  He says he would do better at home being able to sleep in his own bed.  He will be able to eat a little bit better.  There is been no fever.  He has had no headache.  There has been no mouth sores.  We have him on Marinol to help with his appetite.  Hopefully this is providing some benefit for him.   His vital signs are temperature 97.8.  Pulse 90.  Blood pressure 121/79.  Oxygen saturation is 100%.  His lungs sound clear bilaterally.  He has good air movement bilaterally.  Cardiac exam regular rate and rhythm.  He has no murmurs.  Abdomen is soft.  Bowel sounds are present but may be slightly decreased.  He has no guarding or rebound tenderness.  I cannot palpate his liver.  Extremity shows maybe some slight edema in his ankles.  Neurological exam is nonfocal.  I am just happy that he is doing better than yesterday.  He is certainly more alert.  He is not hurting.  The nerve block helped.  As such, we will have to set him up with a RFA to try to permanently deaden the nerves.  Again, we really have to focus on his nutrition.  I know his family is doing a great job in trying to bring food in for him.  I am sure that he probably would eat better  when he does go home.  Hopefully, if everything looks good today, then we can try to get him home tomorrow.  I think this would be reasonable.  I know that the staff on 6 E. will do a good job in helping him.  Lattie Haw, MD  Bill Garza 1:7

## 2021-09-02 NOTE — Evaluation (Signed)
Physical Therapy One Time Evaluation Patient Details Name: Bill Garza MRN: [id number] DOB: 1960/12/04 Today's Date: 09/02/2021  History of Present Illness  "61 year old with past medical history significant for NSCLC with mets to brain and liver, spine/hip, chronic pain syndrome on chronic opioid, methadone, tobacco abuse, diabetes type 2, COPD not on oxygen at baseline, chronic anxiety/depression, on oral Ativan, GERD who presented with progressive intractable back and abdominal pain.  CT abdomen and pelvis revealed findings compatible with progression of metastatic disease, new hepatic metastasis with numerous small pulmonary nodules, enlarged upper abdominal lymph nodes and progression of bone disease.  Innumerable hepatic lesions. Scattered bony lesions compatible with metastatic disease.  Enlargement of lytic expansile lesion involving the right iliac wing. He underwent L4 nerve root block and transforaminal epidural on 08/30/2021 for pain management. He was transfer to ICU 6/13 to monitor him on IV dilaudid.   Family would like to avoid IV dilaudid for pain management."  Clinical Impression  Patient evaluated by Physical Therapy with no further acute PT needs identified. All education has been completed and the patient has no further questions.  Pt assisted with ambulating in hallway and requiring supplemental oxygen.  Discussed current SPO2 at rest and with activity and current need for 2L O2 with activity at this time. Family present and assisted pt also with mobility.  Pt requesting to walk again later and encouraged activity as tolerated but with assist especially due to O2 needs.  Pt hopeful for d/c home.  Pt appears to have good family support and can f/u with HHPT if agreeable.  Recommended RW for stability and endurance upon d/c since pt only has SPC. PT is signing off. Thank you for this referral.      Recommendations for follow up therapy are one component of a multi-disciplinary  discharge planning process, led by the attending physician.  Recommendations may be updated based on patient status, additional functional criteria and insurance authorization.  Follow Up Recommendations Home health PT    Assistance Recommended at Discharge PRN  Patient can return home with the following  A little help with walking and/or transfers;Assistance with cooking/housework;Help with stairs or ramp for entrance    Equipment Recommendations Rolling walker (2 wheels)  Recommendations for Other Services       Functional Status Assessment Patient has had a recent decline in their functional status and demonstrates the ability to make significant improvements in function in a reasonable and predictable amount of time.     Precautions / Restrictions Precautions Precautions: Fall Precaution Comments: monitor sats      Mobility  Bed Mobility Overal bed mobility: Needs Assistance Bed Mobility: Supine to Sit, Sit to Supine     Supine to sit: Min assist Sit to supine: Min assist   General bed mobility comments: requested assist from family, family assisted trunk upright and then LEs onto bed    Transfers Overall transfer level: Needs assistance Equipment used: Rolling walker (2 wheels) Transfers: Sit to/from Stand Sit to Stand: Min assist           General transfer comment: assist to rise, pt also requested family nearby for mobility    Ambulation/Gait Ambulation/Gait assistance: Min guard Gait Distance (Feet): 80 Feet Assistive device: Rolling walker (2 wheels) Gait Pattern/deviations: Step-through pattern, Decreased stride length       General Gait Details: steady with RW, SPO2 dropped on room air so applied 2L O2 [place] and cued pt for pursed lip breathing (see saturations progress note), distance to  pt tolerance  Stairs            Wheelchair Mobility    Modified Rankin (Stroke Patients Only)       Balance Overall balance assessment: Mild deficits  observed, not formally tested                                           Pertinent Vitals/Pain Pain Assessment Pain Assessment: No/denies pain (had pain meds)    Home Living Family/patient expects to be discharged to:: Private residence Living Arrangements: Spouse/significant other Available Help at Discharge: Family Type of Home: House Home Access: Stairs to enter Entrance Stairs-Rails: Psychiatric nurse of Steps: 3   Home Layout: One level Home Equipment: Cane - single point      Prior Function Prior Level of Function : Independent/Modified Independent             Mobility Comments: no device ADLs Comments: independent     Hand Dominance        Extremity/Trunk Assessment        Lower Extremity Assessment Lower Extremity Assessment: Generalized weakness       Communication   Communication: No difficulties  Cognition Arousal/Alertness: Awake/alert Behavior During Therapy: WFL for tasks assessed/performed Overall Cognitive Status: Within Functional Limits for tasks assessed                                          General Comments      Exercises     Assessment/Plan    PT Assessment All further PT needs can be met in the next venue of care  PT Problem List Decreased mobility;Decreased activity tolerance;Decreased strength;Decreased knowledge of use of DME;Cardiopulmonary status limiting activity       PT Treatment Interventions      PT Goals (Current goals can be found in the Care Plan section)  Acute Rehab PT Goals PT Goal Formulation: All assessment and education complete, DC therapy    Frequency       Co-evaluation               AM-PAC PT "6 Clicks" Mobility  Outcome Measure Help needed turning from your back to your side while in a flat bed without using bedrails?: A Little Help needed moving from lying on your back to sitting on the side of a flat bed without using bedrails?: A  Little Help needed moving to and from a bed to a chair (including a wheelchair)?: A Little Help needed standing up from a chair using your arms (e.g., wheelchair or bedside chair)?: A Little Help needed to walk in hospital room?: A Little Help needed climbing 3-5 steps with a railing? : A Lot 6 Click Score: 17    End of Session Equipment Utilized During Treatment: Gait belt Activity Tolerance: Patient tolerated treatment well Patient left: in bed;with call bell/phone within reach;with family/visitor present   PT Visit Diagnosis: Difficulty in walking, not elsewhere classified (R26.2);Muscle weakness (generalized) (M62.81)    Time: 9390-3009 PT Time Calculation (min) (ACUTE ONLY): 14 min   Charges:   PT Evaluation $PT Eval Low Complexity: 1 Low        Kati PT, DPT Acute Rehabilitation Services Pager: [phone] Office: [place] 09/02/2021, 11:04 AM

## 2021-09-02 NOTE — Care Management Important Message (Signed)
Important Message  Patient Details IM Letter given to the Patient. Name: Bill Garza MRN: [id number] Date of Birth: 27-Sep-1960   Medicare Important Message Given:  Yes     [name] 09/02/2021, 10:01 AM

## 2021-09-02 NOTE — Progress Notes (Signed)
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  CT abdomen and pelvis revealed findings compatible with progression of metastatic disease, new hepatic metastasis with numerous small pulmonary nodules, enlarged upper abdominal lymph nodes and progression of bone disease.  Innumerable hepatic lesions.  Thrombosed right portal vein.  Gallbladder is mildly distended.  Scattered bony lesions compatible with metastatic disease.  Enlargement of lytic expansile lesion involving the right iliac wing.  Patient was a started on heparin drip for new portal vein thrombosis.  He underwent L4 nerve root block and transforaminal epidural on 08/30/2021.  He was transition from heparin to Xarelto on 08/31/2021.  He was transfer to ICU 6/13 to monitor him on IV dilaudid.  Family would like to avoid IV dilaudid for pain management.  Avoid IV dilaudid.     Assessment & Plan:   Principal Problem:   Intractable pain Active Problems:   Non-small cell lung cancer metastatic to brain [facility])   Secondary malignant neoplasm of bone and bone marrow (HCC)   DM2 (diabetes mellitus, type 2) (HCC)   Depression   COPD (chronic obstructive pulmonary disease) (HCC)   Tobacco abuse   Elevated alkaline phosphatase level   SIRS (systemic inflammatory response syndrome) (HCC)   Normocytic anemia   Hyponatremia   Anxiety   Generalized pain   General weakness   Constipation   Malnutrition of moderate degree   1-Back pain: L4 radiculopathy,  -Right extra foraminal disc protrusion contacts and potentially irritates the  exiting right L4 nerve root. -Started on Prednisone 6/13. Continue.  -Plan to avoid IV Dilaudid.  -continue with methadone and Oxycodone.  -Defer to palliative care pain management.  Pain stable, better controlled.   2-Non-small cell lung cancer with metastasis to the brain, liver, lungs, spine, hip: Left Lung Lobectomy 09/02/2020 -CT abdomen pelvis: Imaging findings are compatible with progression of metastatic disease. New hepatic metastasis with new small pulmonary nodules, enlarged upper abdominal lymph nodes and progression of bone disease. -MRI Lumbar/thoracic: New and progressive thoracic and lumbosacral osseous metastatic disease without significant extraosseous extension or pathologic fracture. Enlarging bilateral iliac bone and upper abdominal nodal fluid metastatic disease.  Both 1 uUnchanged multilevel lumbar spondylosis as described above. Right extra-foraminal disc protrusion contacts and potentially irritates the exiting right L4 nerve root. -Follow by Dr Marin Olp.   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 He has been transition to [place]. Continue.    COPD; Acute hypoxic resp failure.  PRN Nebulizer.  Wean off oxygen as tolerated.  Incentive spirometry.  Repeated chest x ray today negative for PNA.  Suspect hypoxemia in setting of hypoventilation, also deconditioning , poor lung reserved due to left lung lobectomy.  He will be discharge on oxygen.   Tachycardia: TSH normal . Resolved.   Transaminases; elevation Alk Phosphatase. Suspect related to bone metastasis.   Non anion gap metabolic acidosis; Resolved.   DM type 2 Hyperglycemia. SSI  Chronic anxiety/Depression: On ativan PRN.   Severe protein malnutrition. Continue with ensure.    Elevation BP;  On IV labetalol PRN.   Normocytic anemia: Hb stable.   Hyponatremia; Resolved.   Leukocytosis; Monitor. On  steroids.   Bilateral lower extremity edema: Suspect related to hypoalbuminemia. TED hose.   Received a dose of IV Lasix   Nutrition Problem: Moderate Malnutrition Etiology: chronic illness, cancer and cancer related treatments    Signs/Symptoms: moderate fat depletion, moderate muscle depletion, percent weight loss    Interventions: Ensure Enlive (each supplement provides 350kcal and 20 grams of protein), MVI, Education  Estimated body mass index is 21.56 kg/m as calculated from the following:   Height as of this encounter: 6' (1.829 m).   Weight as of this encounter: 72.1 kg.   DVT prophylaxis: Xarelto Code Status: Full code Family Communication: Family at bedside  Disposition Plan:  Status is: Inpatient Remains inpatient appropriate because: management of pain     Consultants:  Dr Marin Olp  Procedures:    Antimicrobials:    Subjective: He report only soreness. Pain has improved. Cough has improved.  He is feeling better.  He has nodule head, for last 4 weeks, it has increase in sized. Pain full. Doesn't appears infected. He will need follow up with Dermatologist   Objective: Vitals:   09/01/21 2206 09/02/21 0239 09/02/21 0648 09/02/21 1339  BP: 122/68 123/72 121/79 124/71  Pulse: 93 91 90 91  Resp: _0 Temp: 99 F (37.2 C) 98 F (36.7 C) 97.8 F (36.6 C) 97.6 F (36.4 C)  TempSrc: Oral Oral Oral Oral  SpO2: 99% 100% 100% 94%  Weight:      Height:        Intake/Output Summary (Last 24 hours) at 09/02/2021 1448 Last data filed at 09/02/2021 0800 Gross per 24 hour  Intake 480 ml  Output 1575 ml  Net -1095 ml    Filed Weights   08/25/21 0809  Weight: 72.1 kg    Examination:  General exam: NAD Respiratory system: CTA Cardiovascular system: S 1, S 2 RRR Gastrointestinal system: BS present, soft, NT Central nervous system: alert, follows command Extremities: trace edema.    Data Reviewed: I have personally reviewed following labs and imaging studies  CBC: Recent Labs  Lab 08/29/21 0616 08/30/21 0519 08/31/21 0503  09/01/21 0323 09/02/21 1250  WBC 12.2* 10.7* 12.6* 12.3* 12.4*  NEUTROABS  --   --   --  9.1*  --   HGB 10.4* 10.6* 10.7* 10.9* 12.0*  HCT 32.7* 32.9* 32.3* 33.4* 36.8*  MCV 95.3 94.8 93.9 94.4 95.3  PLT 468* 475* 486* 458* 481*    Basic Metabolic Panel: Recent Labs  Lab 08/28/21 0601 08/29/21 0616 08/30/21 0519 08/31/21 0503 09/01/21 0323  NA 137 138 136 136 136  K 4.2 4.4 4.2 4.6 4.0  CL 108 107 101 101 99  CO2 21* _1 GLUCOSE 210* 190* 172* 147* 113*  BUN _2 CREATININE 0.52* 0.51* 0.52* 0.57* 0.61  CALCIUM 8.4* 8.6* 8.7* 8.9 8.9  MG  --   --   --   --  2.1  PHOS  --   --   --   --  3.0    GFR: Estimated Creatinine Clearance: 102.6 mL/min (by C-G formula based on SCr of 0.61 mg/dL). Liver Function Tests: Recent Labs  Lab 08/28/21 0601 08/29/21 0616 08/30/21 0519 08/31/21 0503 09/01/21 0323  AST 55* 63* 49* 42* 39  ALT 43 52* 48* 42 39  ALKPHOS 904* 858* 746* 758* 765*  BILITOT 0.5 0.4 1.1 1.0 0.9  PROT 5.9* 6.0* 6.1* 6.5 6.4*  ALBUMIN 2.5* 2.6* 2.7* 2.7*  2.9*    No results for input(s): "LIPASE", "AMYLASE" in the last 168 hours.  No results for input(s): "AMMONIA" in the last 168 hours. Coagulation Profile: No results for input(s): "INR", "PROTIME" in the last 168 hours.  Cardiac Enzymes: No results for input(s): "CKTOTAL", "CKMB", "CKMBINDEX", "TROPONINI" in the last 168 hours. BNP (last 3 results) No results for input(s): "PROBNP" in the last 8760 hours. HbA1C: No results for input(s): "HGBA1C" in the last 72 hours. CBG: Recent Labs  Lab 09/01/21 1136 09/01/21 1659 09/01/21 2207 09/02/21 0727 09/02/21 1129  GLUCAP 99 136* 128* 130* 119*    Lipid Profile: No results for input(s): "CHOL", "HDL", "LDLCALC", "TRIG", "CHOLHDL", "LDLDIRECT" in the last 72 hours. Thyroid Function Tests: Recent Labs    09/01/21 0323  TSH 1.819    Anemia Panel: No results for input(s): "VITAMINB12", "FOLATE", "FERRITIN", "TIBC", "IRON",  "RETICCTPCT" in the last 72 hours. Sepsis Labs: No results for input(s): "PROCALCITON", "LATICACIDVEN" in the last 168 hours.  Recent Results (from the past 240 hour(s))  Culture, blood (Routine X 2) w Reflex to ID Panel     Status: None   Collection Time: 08/25/21  3:01 PM   Specimen: Right Antecubital; Blood  Result Value Ref Range Status   Specimen Description   Final    RIGHT ANTECUBITAL BLOOD Performed at [facility], [address] [place], [place] [address]    Special Requests   Final    Blood Culture adequate volume BOTTLES DRAWN AEROBIC AND ANAEROBIC Performed at [facility], [address] [place], [place] [address]    Culture   Final    NO GROWTH 5 DAYS Performed at [facility], [place] [address], [place], [place] [address]    Report Status 08/30/2021 FINAL  Final  Culture, blood (Routine X 2) w Reflex to ID Panel     Status: Abnormal   Collection Time: 08/25/21  5:30 PM   Specimen: BLOOD  Result Value Ref Range Status   Specimen Description BLOOD SITE NOT SPECIFIED  Final   Special Requests IN PEDIATRIC BOTTLE Blood Culture adequate volume  Final   Culture  Setup Time   Final    GRAM POSITIVE COCCI IN CLUSTERS IN PEDIATRIC BOTTLE CRITICAL RESULT CALLED TO, READ BACK BY AND VERIFIED WITH: L POINDEXTER,[email or web address]_[email or web address]  08/26/21 [place]    Culture (A)  Final    STAPHYLOCOCCUS EPIDERMIDIS THE SIGNIFICANCE OF ISOLATING THIS ORGANISM FROM A SINGLE VENIPUNCTURE CANNOT BE PREDICTED WITHOUT FURTHER CLINICAL AND CULTURE CORRELATION. SUSCEPTIBILITIES AVAILABLE ONLY ON REQUEST. Performed at [facility], [place] [address] [place], [place] [address]    Report Status 08/28/2021 FINAL  Final  Blood Culture ID Panel (Reflexed)     Status: Abnormal   Collection Time: 08/25/21  5:30 PM  Result Value Ref Range Status   Enterococcus faecalis NOT DETECTED NOT DETECTED Final   Enterococcus Faecium NOT DETECTED NOT DETECTED Final   Listeria monocytogenes NOT  DETECTED NOT DETECTED Final   Staphylococcus species DETECTED (A) NOT DETECTED Final    Comment: CRITICAL RESULT CALLED TO, READ BACK BY AND VERIFIED WITH: L POINDEXTER,PHARMD_1  08/26/21 [place]    Staphylococcus aureus (BCID) NOT DETECTED NOT DETECTED Final   Staphylococcus epidermidis DETECTED (A) NOT DETECTED Final    Comment: CRITICAL RESULT CALLED TO, READ BACK BY AND VERIFIED WITH: L POINDEXTER,PHARMD_2  08/26/21 [place]    Staphylococcus lugdunensis NOT DETECTED NOT DETECTED Final   Streptococcus species NOT DETECTED NOT DETECTED Final  Streptococcus agalactiae NOT DETECTED NOT DETECTED Final   Streptococcus pneumoniae NOT DETECTED NOT DETECTED Final   Streptococcus pyogenes NOT DETECTED NOT DETECTED Final   A.calcoaceticus-baumannii NOT DETECTED NOT DETECTED Final   Bacteroides fragilis NOT DETECTED NOT DETECTED Final   Enterobacterales NOT DETECTED NOT DETECTED Final   Enterobacter cloacae complex NOT DETECTED NOT DETECTED Final   Escherichia coli NOT DETECTED NOT DETECTED Final   Klebsiella aerogenes NOT DETECTED NOT DETECTED Final   Klebsiella oxytoca NOT DETECTED NOT DETECTED Final   Klebsiella pneumoniae NOT DETECTED NOT DETECTED Final   Proteus species NOT DETECTED NOT DETECTED Final   Salmonella species NOT DETECTED NOT DETECTED Final   Serratia marcescens NOT DETECTED NOT DETECTED Final   Haemophilus influenzae NOT DETECTED NOT DETECTED Final   Neisseria meningitidis NOT DETECTED NOT DETECTED Final   Pseudomonas aeruginosa NOT DETECTED NOT DETECTED Final   Stenotrophomonas maltophilia NOT DETECTED NOT DETECTED Final   Candida albicans NOT DETECTED NOT DETECTED Final   Candida auris NOT DETECTED NOT DETECTED Final   Candida glabrata NOT DETECTED NOT DETECTED Final   Candida krusei NOT DETECTED NOT DETECTED Final   Candida parapsilosis NOT DETECTED NOT DETECTED Final   Candida tropicalis NOT DETECTED NOT DETECTED Final   Cryptococcus neoformans/gattii NOT DETECTED NOT  DETECTED Final   Methicillin resistance mecA/C NOT DETECTED NOT DETECTED Final    Comment: Performed at [facility], [address], [place], [place] [address]         Radiology Studies: DG Chest 2 View  Result Date: 09/02/2021 CLINICAL DATA:  Hypoxemia. EXAM: CHEST - 2 VIEW COMPARISON:  August 29, 2021. FINDINGS: The heart size and mediastinal contours are within normal limits. Both lungs are clear. The visualized skeletal structures are unremarkable. IMPRESSION: No active cardiopulmonary disease. Electronically Signed   By: Marijo Conception M.D.   On: 09/02/2021 14:26        Scheduled Meds:  Chlorhexidine Gluconate Cloth  6 each Topical Daily   dronabinol  2.5 mg Oral TID AC   escitalopram  5 mg Oral Daily   feeding supplement  1 Container Oral TID BM   insulin aspart  0-15 Units Subcutaneous TID WC   lactulose  13.3333-20 g Oral BID   mouth rinse  15 mL Mouth Rinse BID   methadone  10 mg Oral Q12H   mometasone-formoterol  2 puff Inhalation BID   montelukast  10 mg Oral QHS   multivitamin with minerals  1 tablet Oral Daily   pantoprazole  40 mg Oral BID   predniSONE  20 mg Oral Q breakfast   Rivaroxaban  15 mg Oral BID WC   Followed by   Derrill Memo ON 09/21/2021] rivaroxaban  20 mg Oral Q supper   senna-docusate  2 tablet Oral BID   traZODone  50 mg Oral QHS   Continuous Infusions:   LOS: 7 days    Time spent: 35 minutes/     Saadiya Wilfong A Emerie Vanderkolk, MD [facility]   If 7PM-7AM, please contact night-coverage [email or web address]  09/02/2021, 2:48 PM

## 2021-09-03 ENCOUNTER — Telehealth: Payer: Self-pay

## 2021-09-03 DIAGNOSIS — Z515 Encounter for palliative care: Secondary | ICD-10-CM | POA: Diagnosis not present

## 2021-09-03 DIAGNOSIS — F419 Anxiety disorder, unspecified: Secondary | ICD-10-CM

## 2021-09-03 DIAGNOSIS — R52 Pain, unspecified: Secondary | ICD-10-CM | POA: Diagnosis not present

## 2021-09-03 DIAGNOSIS — R530 Neoplastic (malignant) related fatigue: Secondary | ICD-10-CM

## 2021-09-03 DIAGNOSIS — C799 Secondary malignant neoplasm of unspecified site: Secondary | ICD-10-CM | POA: Diagnosis not present

## 2021-09-03 DIAGNOSIS — G893 Neoplasm related pain (acute) (chronic): Secondary | ICD-10-CM | POA: Diagnosis not present

## 2021-09-03 DIAGNOSIS — C3490 Malignant neoplasm of unspecified part of unspecified bronchus or lung: Secondary | ICD-10-CM

## 2021-09-03 LAB — CBC WITH DIFFERENTIAL/PLATELET
Abs Immature Granulocytes: 0.08 10*3/uL — ABNORMAL HIGH (ref 0.00–0.07)
Basophils Absolute: 0 10*3/uL (ref 0.0–0.1)
Basophils Relative: 0 %
Eosinophils Absolute: 0 10*3/uL (ref 0.0–0.5)
Eosinophils Relative: 0 %
HCT: 36.5 % — ABNORMAL LOW (ref 39.0–52.0)
Hemoglobin: 11.8 g/dL — ABNORMAL LOW (ref 13.0–17.0)
Immature Granulocytes: 1 %
Lymphocytes Relative: 10 %
Lymphs Abs: 1.2 10*3/uL (ref 0.7–4.0)
MCH: 30.6 pg (ref 26.0–34.0)
MCHC: 32.3 g/dL (ref 30.0–36.0)
MCV: 94.8 fL (ref 80.0–100.0)
Monocytes Absolute: 1.3 10*3/uL — ABNORMAL HIGH (ref 0.1–1.0)
Monocytes Relative: 10 %
Neutro Abs: 10.2 10*3/uL — ABNORMAL HIGH (ref 1.7–7.7)
Neutrophils Relative %: 79 %
Platelets: 478 10*3/uL — ABNORMAL HIGH (ref 150–400)
RBC: 3.85 MIL/uL — ABNORMAL LOW (ref 4.22–5.81)
RDW: 16 % — ABNORMAL HIGH (ref 11.5–15.5)
WBC: 12.9 10*3/uL — ABNORMAL HIGH (ref 4.0–10.5)
nRBC: 0 % (ref 0.0–0.2)

## 2021-09-03 LAB — COMPREHENSIVE METABOLIC PANEL
ALT: 44 U/L (ref 0–44)
AST: 48 U/L — ABNORMAL HIGH (ref 15–41)
Albumin: 3.2 g/dL — ABNORMAL LOW (ref 3.5–5.0)
Alkaline Phosphatase: 850 U/L — ABNORMAL HIGH (ref 38–126)
Anion gap: 11 (ref 5–15)
BUN: 13 mg/dL (ref 6–20)
CO2: 24 mmol/L (ref 22–32)
Calcium: 8.8 mg/dL — ABNORMAL LOW (ref 8.9–10.3)
Chloride: 98 mmol/L (ref 98–111)
Creatinine, Ser: 0.54 mg/dL — ABNORMAL LOW (ref 0.61–1.24)
GFR, Estimated: >60 mL/min (ref >60)
Glucose, Bld: 138 mg/dL — ABNORMAL HIGH (ref 70–99)
Potassium: 4 mmol/L (ref 3.5–5.1)
Sodium: 133 mmol/L — ABNORMAL LOW (ref 135–145)
Total Bilirubin: 1.3 mg/dL — ABNORMAL HIGH (ref 0.3–1.2)
Total Protein: 7 g/dL (ref 6.5–8.1)

## 2021-09-03 LAB — GLUCOSE, CAPILLARY
Glucose-Capillary: 125 mg/dL — ABNORMAL HIGH (ref 70–99)
Glucose-Capillary: 177 mg/dL — ABNORMAL HIGH (ref 70–99)

## 2021-09-03 LAB — PREALBUMIN: Prealbumin: 10.2 mg/dL — ABNORMAL LOW (ref 18–38)

## 2021-09-03 MED ORDER — LORAZEPAM 1 MG PO TABS: 1.0000 mg | ORAL_TABLET | Freq: Three times a day (TID) | ORAL | 0 refills | Status: DC | PRN

## 2021-09-03 MED ORDER — PREDNISONE 20 MG PO TABS: 20.0000 mg | ORAL_TABLET | Freq: Every day | ORAL | 0 refills | Status: AC

## 2021-09-03 MED ORDER — RIVAROXABAN 20 MG PO TABS: 20.0000 mg | ORAL_TABLET | Freq: Every day | ORAL | 3 refills | Status: DC

## 2021-09-03 MED ORDER — RIVAROXABAN 15 MG PO TABS: 15.0000 mg | ORAL_TABLET | Freq: Two times a day (BID) | ORAL | 0 refills | Status: AC

## 2021-09-03 MED ORDER — METHADONE HCL 5 MG PO TABS: 5.0000 mg | ORAL_TABLET | Freq: Every day | ORAL | Status: DC

## 2021-09-03 MED ORDER — DRONABINOL 5 MG PO CAPS: 5.0000 mg | ORAL_CAPSULE | Freq: Two times a day (BID) | ORAL | 0 refills | Status: AC

## 2021-09-03 MED ORDER — METHADONE HCL 10 MG PO TABS: 10.0000 mg | ORAL_TABLET | Freq: Every day | ORAL | 0 refills | Status: DC

## 2021-09-03 MED ORDER — CYCLOBENZAPRINE HCL 5 MG PO TABS: 5.0000 mg | ORAL_TABLET | Freq: Three times a day (TID) | ORAL | 0 refills | Status: AC | PRN

## 2021-09-03 MED ORDER — OXYCODONE HCL 10 MG PO TABS: 10.0000 mg | ORAL_TABLET | Freq: Four times a day (QID) | ORAL | 0 refills | Status: DC | PRN

## 2021-09-03 MED ORDER — OXYCODONE HCL 5 MG PO TABS
10.0000 mg | ORAL_TABLET | Freq: Four times a day (QID) | ORAL | Status: DC | PRN
  Administered 2021-09-03: 10 mg via ORAL
  Filled 2021-09-03: qty 2

## 2021-09-03 MED ORDER — DRONABINOL 2.5 MG PO CAPS
5.0000 mg | ORAL_CAPSULE | Freq: Two times a day (BID) | ORAL | Status: DC
  Administered 2021-09-03: 5 mg via ORAL
  Filled 2021-09-03: qty 2

## 2021-09-03 NOTE — Discharge Summary (Signed)
Physician Discharge Summary   Patient: Bill Garza MRN: [id number] DOB: October 02, 1960  Admit date:     08/25/2021  Discharge date: 09/03/21  Discharge Physician: Elmarie Shiley   PCP: Aleen Campi, NP   Recommendations at discharge:   Follow up with palliative for further management of pain medications.  Follow with Dr Marin Olp for evaluation for further treatment for lung cancer.   Discharge Diagnoses: Principal Problem:   Intractable pain Active Problems:   Non-small cell lung cancer metastatic to brain [facility])   Secondary malignant neoplasm of bone and bone marrow (HCC)   DM2 (diabetes mellitus, type 2) (HCC)   Depression   COPD (chronic obstructive pulmonary disease) (HCC)   Tobacco abuse   Elevated alkaline phosphatase level   SIRS (systemic inflammatory response syndrome) (HCC)   Normocytic anemia   Hyponatremia   Anxiety   Generalized pain   General weakness   Constipation   Malnutrition of moderate degree  Resolved Problems:   * No resolved hospital problems. *  Hospital Course: 61 year old with past medical history significant for NSCLC with mets to brain and liver, spine/hip, chronic pain syndrome on chronic opioid, methadone, tobacco abuse, diabetes type 2, COPD not on oxygen at baseline, chronic anxiety/depression, on oral Ativan, GERD who presented with progressive intractable back and abdominal pain.  CT abdomen and pelvis revealed findings compatible with progression of metastatic disease, new hepatic metastasis with numerous small pulmonary nodules, enlarged upper abdominal lymph nodes and progression of bone disease.  Innumerable hepatic lesions.  Thrombosed right portal vein.  Gallbladder is mildly distended.  Scattered bony lesions compatible with metastatic disease.  Enlargement of lytic expansile lesion involving the right iliac wing.   Patient was a started on heparin drip for new portal vein thrombosis.  He underwent L4 nerve root block and transforaminal  epidural on 08/30/2021.  He was transition from heparin to Xarelto on 08/31/2021.   He was transfer to ICU 6/13 to monitor him on IV dilaudid.  Family would like to avoid IV dilaudid for pain management.  Patient develops hypoxemia, suspect multifactorial poor reserve due to lung lobectomy, hypoventilation, deconditioning. He was discharge home on 2 L oxygen.  His pain has been better controlled. He will be discharge home on methadone taper,and on oxycodone.    Assessment and Plan: 1-Back pain: L4 radiculopathy,  -Right extra foraminal disc protrusion contacts and potentially irritates the exiting right L4 nerve root. -Started on Prednisone 6/13. Continue.  -Plan to avoid IV Dilaudid.  -continue with methadone and Oxycodone.  -Defer to palliative care pain management.  Pain stable, better controlled. palliative is weaning off methadone.    2-Non-small cell lung cancer with metastasis to the brain, liver, lungs, spine, hip: Left Lung Lobectomy 09/02/2020 -CT abdomen pelvis: Imaging findings are compatible with progression of metastatic disease. New hepatic metastasis with new small pulmonary nodules, enlarged upper abdominal lymph nodes and progression of bone disease. -MRI Lumbar/thoracic: New and progressive thoracic and lumbosacral osseous metastatic disease without significant extraosseous extension or pathologic fracture. Enlarging bilateral iliac bone and upper abdominal nodal fluid metastatic disease.  Both 1 uUnchanged multilevel lumbar spondylosis as described above. Right extra-foraminal disc protrusion contacts and potentially irritates the exiting right L4 nerve root. -Follow by Dr Marin Olp.    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 He has been transition to [place]. Continue.      COPD; Acute hypoxic resp failure.  PRN Nebulizer.  Wean off oxygen as tolerated.  Incentive spirometry.  Repeated  chest x ray today negative for PNA.  Suspect hypoxemia in setting of  hypoventilation, also deconditioning , poor lung reserved due to left lung lobectomy.  He will be discharge on oxygen, 2L    Tachycardia: TSH normal . Resolved.    Transaminases; elevation Alk Phosphatase. Suspect related to bone metastasis.    Non anion gap metabolic acidosis; Resolved.    DM type 2 Hyperglycemia. SSI   Chronic anxiety/Depression: On ativan PRN.     Elevation BP:  On IV labetalol PRN.  BP normalized with pain controlled.   Normocytic anemia: Hb stable.    Hyponatremia; Resolved.    Leukocytosis; Monitor. On steroids. Chest x ray negative for PNA>    Bilateral lower extremity edema: Suspect related to hypoalbuminemia. TED hose.  Received a dose of IV Lasix   Moderate Malnutrition: on supplement.  Nutrition Problem: Moderate Malnutrition Etiology: chronic illness, cancer and cancer related treatments       Signs/Symptoms: moderate fat depletion, moderate muscle depletion, percent weight loss          Consultants: Dr Marin Olp Disposition: Home Diet recommendation:  Regular diet DISCHARGE MEDICATION: Allergies as of 09/03/2021       Reactions   Gabapentin Swelling   Leg swelling   Levetiracetam Other (See Comments)   Personality changes   Pregabalin Swelling   Leg swelling   Bupropion Nausea And Vomiting   Duloxetine Other (See Comments)   Excessive sleeping   Ezetimibe Other (See Comments)   Unknown reaction   Other Other (See Comments)   Antibiotics - per wife at bedside, patient will develop C-diff if given "any antibiotic"   Prochlorperazine Nausea And Vomiting   Lamotrigine Itching, Rash        Medication List     STOP taking these medications    Accu-Chek Guide test strip Generic drug: glucose blood   dexamethasone 4 MG tablet Commonly known as: DECADRON   ketorolac 10 MG tablet Commonly known as: TORADOL   methocarbamol 500 MG tablet Commonly known as: Robaxin   ondansetron 8 MG disintegrating tablet Commonly  known as: ZOFRAN-ODT   oxyCODONE-acetaminophen 10-325 MG tablet Commonly known as: Percocet   promethazine 25 MG suppository Commonly known as: PHENERGAN       TAKE these medications    Accu-Chek Guide w/Device Kit USE AS DIRECTED TO CHECK BLOOD SUGAR   GlucoCom Blood Glucose Monitor Devi   albuterol (2.5 MG/3ML) 0.083% nebulizer solution Commonly known as: PROVENTIL Take 2.5 mg by nebulization daily as needed for shortness of breath or wheezing. What changed: Another medication with the same name was changed. Make sure you understand how and when to take each.   albuterol 108 (90 Base) MCG/ACT inhaler Commonly known as: VENTOLIN HFA Inhale 2 puffs into the lungs every 4 (four) hours as needed for wheezing or shortness of breath. What changed: when to take this   aspirin EC 81 MG tablet Take 81 mg by mouth daily.   budesonide-formoterol 160-4.5 MCG/ACT inhaler Commonly known as: SYMBICORT Inhale 2 puffs into the lungs 2 (two) times daily.   cyclobenzaprine 5 MG tablet Commonly known as: FLEXERIL Take 1 tablet (5 mg total) by mouth 3 (three) times daily as needed for muscle spasms.   dronabinol 5 MG capsule Commonly known as: MARINOL Take 1 capsule (5 mg total) by mouth 2 (two) times daily before lunch and supper. What changed:  medication strength how much to take when to take this   escitalopram 5 MG tablet Commonly  known as: LEXAPRO Take 5 mg by mouth daily.   fluocinolone 0.01 % cream Commonly known as: VANOS Apply 1 application. topically daily as needed (rash).   lactulose 10 GM/15ML solution Commonly known as: CHRONULAC Take 20-30 mLs by mouth 2 (two) times daily.   LORazepam 1 MG tablet Commonly known as: ATIVAN Take 1 tablet (1 mg total) by mouth every 8 (eight) hours as needed for anxiety. What changed: when to take this   metFORMIN 500 MG tablet Commonly known as: GLUCOPHAGE Take 500 mg by mouth daily.   methadone 10 MG tablet Commonly  known as: Dolophine Take 1 tablet (10 mg total) by mouth daily. Patient to take 1 tablet (10 mg daily) Friday, Saturday, _0 /16/23 1125  09/03/21 1123  For home use only DME Walker rolling  Once       Question Answer Comment  Walker: With 5 Inch Wheels   Patient needs a walker to treat with the following condition Balance disorder      09/03/21 1122            Discharge Exam: Filed Weights   08/25/21 0809  Weight: 72.1 kg   General; NAD Lung CTA  Condition at discharge: stable  The results of significant diagnostics from this hospitalization (including imaging, microbiology, ancillary and laboratory) are listed below for reference.   Imaging Studies: DG Chest 2 View  Result Date: 09/02/2021 CLINICAL DATA:  Hypoxemia. EXAM: CHEST - 2 VIEW COMPARISON:  August 29, 2021. FINDINGS: The heart size and mediastinal contours are within normal limits. Both lungs are clear. The visualized skeletal structures are unremarkable. IMPRESSION: No active cardiopulmonary disease. Electronically Signed   By: Marijo Conception M.D.   On: 09/02/2021 14:26   IR Fluoro Guide Ndl Plmt / BX  Result Date: 08/30/2021 CLINICAL DATA:  61 year old male with L4-L5 extraforaminal disc extrusion contacting the right L4 nerve root and associated with right L4 radiculopathy. In-patient nerve root block for symptom relief. EXAM: IR INJECT/THERA/INC  NEEDLE/CATH/PLC EPI/LUMB/SAC W/IMG FLUOROSCOPY TIME:  Radiation exposure index: 14 mGy PROCEDURE: The procedure, risks, benefits, and alternatives were explained to the patient. Questions regarding the procedure were encouraged and answered. The patient understands and consents to the procedure. RIGHT L4 NERVE ROOT BLOCK AND TRANSFORAMINAL EPIDURAL: A posterior oblique approach was taken to the intervertebral foramen on the right at L4 using a curved 22 gauge spinal needle. Injection of Omnipaque 180 outlined the L4 nerve root and showed good epidural spread. No vascular opacification is seen. 80 mg of Depo-Medrol mixed with 2 mL 1% lidocaine were instilled. The procedure was well-tolerated, and the patient was discharged thirty minutes following the injection in good condition. COMPLICATIONS: None IMPRESSION: Technically successful injection consisting of a right L4 nerve root block and transforaminal epidural. Electronically Signed   By: [name] M.D.   On: 08/30/2021 14:47   DG CHEST PORT 1 VIEW  Result Date: 08/29/2021 CLINICAL DATA:  Hypoxia. EXAM: PORTABLE CHEST 1 VIEW COMPARISON:  June 28, 2021 FINDINGS: The heart size and mediastinal contours are within normal limits. Mild areas of atelectasis and/or infiltrate are seen within the bilateral lung bases. There is no evidence of a pleural effusion or pneumothorax. The visualized skeletal structures are unremarkable. IMPRESSION: Mild bibasilar atelectasis and/or infiltrate. Electronically Signed   By: Virgina Norfolk M.D.   On: 08/29/2021 17:08   MR LIVER W WO CONTRAST  Result Date: 08/27/2021 CLINICAL DATA:  Inpatient. Metastatic non-small cell lung cancer. Indeterminate liver lesions on recent sonogram. Progression of metastatic disease in the liver, lymph nodes and bones on CT from 1 day prior. EXAM: MRI ABDOMEN WITHOUT AND WITH CONTRAST TECHNIQUE: Multiplanar multisequence MR imaging of the abdomen was performed both before and after the  administration of intravenous contrast. CONTRAST:  50m GADAVIST GADOBUTROL 1 MMOL/ML IV SOLN COMPARISON:  06/22/2021 PET-CT. 08/24/2021 abdominal sonogram. 08/25/2021 CT abdomen/pelvis. FINDINGS: Lower chest: Solid 0.5 cm medial dependent right lung base pulmonary nodule (series 8/image 5) as better seen on recent CT. Hepatobiliary: Mild hepatomegaly. Background diffuse hepatic hemosiderosis. Innumerable (> 30) enhancing solid liver masses scattered throughout the liver, confluent in the right liver, with restricted diffusion, compatible with metastatic disease, generally new since 06/18/2021 CT. Representative 2.0 x 1.8 cm caudate  lobe mass (series 5/image 9). Representative segment 7 right liver 2.9 x 2.1 cm mass (series 5/image 9). Representative 1.5 x 1.3 cm segment 4B left liver mass (series 5/image 16). Small amount of layering sludge in gallbladder. Mild diffuse gallbladder wall thickening, asymmetrically prominent adjacent to the liver. No cholelithiasis. No pericholecystic fluid. Mild-to-moderate intrahepatic biliary ductal dilatation throughout the right liver with relative sparing of the left liver, with central extrinsic biliary compression by the confluent central right liver metastases. Top normal caliber common bile duct (6 mm diameter) with smooth distal tapering. No choledocholithiasis. Pancreas: No pancreatic mass or duct dilation.  No pancreas divisum. Spleen: Normal size spleen. Diffuse splenic hemosiderosis. A few scattered tiny T2 hyperintense subcentimeter splenic lesions are unchanged from 06/18/2021 CT, considered benign. Adrenals/Urinary Tract: Normal adrenals. No hydronephrosis. Normal kidneys with no renal mass. Stomach/Bowel: Normal non-distended stomach. Visualized small and large bowel is normal caliber, with no bowel wall thickening. Vascular/Lymphatic: Normal caliber abdominal aorta. Patent renal, splenic and hepatic veins. Extrinsic narrowing of the right portal vein tree by the  confluent right liver metastatic disease. Portal veins appear grossly patent, with evaluation limited by motion degradation. Enlarged 1.7 cm short axis diameter portacaval node (series 8/image 16) and a few mildly enlarged aortocaval nodes, largest 1.0 cm (series 8/image 24), all newly enlarged since 06/18/2021 CT. Other: No abdominal ascites or focal fluid collection. Musculoskeletal: Several enhancing osseous lesions throughout the visualized axial skeleton, for example 2.4 cm in the T11 vertebral body (series 3/image 23), not clearly seen on 06/18/2021 CT, and expansile 4.3 cm right iliac crest lesion (series 3/image 14), increased from 0.8 cm on 06/18/2021 CT. IMPRESSION: 1. Innumerable (> 30) enhancing liver masses scattered throughout the liver, confluent in the right liver, compatible with metastatic disease, generally new since 06/18/2021 CT. 2. Several enhancing osseous lesions throughout the visualized axial skeleton, increased from 06/18/2021 CT, compatible with progressive multifocal osseous metastatic disease. 3. Mild-to-moderate intrahepatic biliary ductal dilatation throughout the right liver and extrinsic narrowing of the right portal vein tree by the confluent central right liver metastatic disease. Top normal caliber common bile duct (6 mm diameter) with smooth distal tapering. No choledocholithiasis. 4. Small amount of gallbladder sludge. Nonspecific mild gallbladder wall thickening, potentially reactive or due to noninflammatory edema. No cholelithiasis. 5. 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 Electronically Signed   By: Ilona Sorrel M.D.   On: 08/27/2021 08:22   MR Lumbar Spine W Wo Contrast  Result Date: 08/25/2021 CLINICAL DATA:  Worsening back and right leg pain. History of metastatic lung cancer. EXAM: MRI THORACIC AND LUMBAR SPINE WITHOUT AND WITH CONTRAST TECHNIQUE: Multiplanar and multiecho pulse sequences of the thoracic and lumbar spine were obtained without and with intravenous  contrast. CONTRAST:  31m GADAVIST GADOBUTROL 1 MMOL/ML IV SOLN COMPARISON:  CT lumbar myelogram dated August 20, 2021. MRI thoracic and lumbar spine dated Jul 30, 2021. FINDINGS: MRI THORACIC SPINE FINDINGS Alignment:  Physiologic. Vertebrae: T3, T6, T8, and T11 metastases have increased in size. New tiny lesions at T10 and T12 (series 17, images 5 and 7). No epidural tumor extension. No fracture or evidence of discitis. Cord: Normal signal and morphology. No abnormal intrathecal enhancement. Paraspinal and other soft tissues: Negative. Disc levels: Unchanged small right paracentral disc protrusions at T1-T2 and T2-T3. No spinal canal or neuroforaminal stenosis at any level. MRI LUMBAR SPINE FINDINGS Segmentation:  Standard. Alignment:  Unchanged mild retrolisthesis at L3-L4. Vertebrae: L4 and S2 vertebral body metastases have increased in size. New L1,  L2, L3, S1, and S3 vertebral body metastases. No epidural tumor extension. No fracture or evidence of discitis. Conus medullaris: Extends to the L1-L2 level and appears normal. No abnormal intrathecal enhancement. Paraspinal and other soft tissues: Enlarging metastases in the bilateral posterior iliac bones. Enlarging upper abdominal lymphadenopathy. Disc levels: T12-L1:  Negative. L1-L2:  Negative. L2-L3: Negative disc. Unchanged mild bilateral facet arthropathy. No stenosis. L3-L4: Unchanged mild disc bulging with superimposed right subarticular disc protrusion. Unchanged mild bilateral facet arthropathy. Unchanged mild spinal canal stenosis. Unchanged moderate right and mild left lateral recess stenosis. Unchanged mild bilateral neuroforaminal stenosis. L4-L5: Prior left hemilaminectomy. Unchanged mild disc bulging with superimposed right extraforaminal disc protrusion contacting the exiting right L4 nerve root. Unchanged mild right lateral recess and bilateral neuroforaminal stenosis. No spinal canal stenosis. L5-S1: Unchanged small left paracentral disc protrusion  and mild bilateral facet arthropathy. No stenosis. IMPRESSION: 1. New and progressive thoracic and lumbosacral osseous metastatic disease without significant extraosseous extension or pathologic fracture. 2. Enlarging bilateral iliac bone and upper abdominal nodal metastatic disease. 3. Unchanged multilevel lumbar spondylosis as described above. Right extraforaminal disc protrusion contacts and potentially irritates the exiting right L4 nerve root. Electronically Signed   By: Titus Dubin M.D.   On: 08/25/2021 14:55   MR THORACIC SPINE W WO CONTRAST  Result Date: 08/25/2021 CLINICAL DATA:  Worsening back and right leg pain. History of metastatic lung cancer. EXAM: MRI THORACIC AND LUMBAR SPINE WITHOUT AND WITH CONTRAST TECHNIQUE: Multiplanar and multiecho pulse sequences of the thoracic and lumbar spine were obtained without and with intravenous contrast. CONTRAST:  47m GADAVIST GADOBUTROL 1 MMOL/ML IV SOLN COMPARISON:  CT lumbar myelogram dated August 20, 2021. MRI thoracic and lumbar spine dated Jul 30, 2021. FINDINGS: MRI THORACIC SPINE FINDINGS Alignment:  Physiologic. Vertebrae: T3, T6, T8, and T11 metastases have increased in size. New tiny lesions at T10 and T12 (series 17, images 5 and 7). No epidural tumor extension. No fracture or evidence of discitis. Cord: Normal signal and morphology. No abnormal intrathecal enhancement. Paraspinal and other soft tissues: Negative. Disc levels: Unchanged small right paracentral disc protrusions at T1-T2 and T2-T3. No spinal canal or neuroforaminal stenosis at any level. MRI LUMBAR SPINE FINDINGS Segmentation:  Standard. Alignment:  Unchanged mild retrolisthesis at L3-L4. Vertebrae: L4 and S2 vertebral body metastases have increased in size. New L1, L2, L3, S1, and S3 vertebral body metastases. No epidural tumor extension. No fracture or evidence of discitis. Conus medullaris: Extends to the L1-L2 level and appears normal. No abnormal intrathecal enhancement.  Paraspinal and other soft tissues: Enlarging metastases in the bilateral posterior iliac bones. Enlarging upper abdominal lymphadenopathy. Disc levels: T12-L1:  Negative. L1-L2:  Negative. L2-L3: Negative disc. Unchanged mild bilateral facet arthropathy. No stenosis. L3-L4: Unchanged mild disc bulging with superimposed right subarticular disc protrusion. Unchanged mild bilateral facet arthropathy. Unchanged mild spinal canal stenosis. Unchanged moderate right and mild left lateral recess stenosis. Unchanged mild bilateral neuroforaminal stenosis. L4-L5: Prior left hemilaminectomy. Unchanged mild disc bulging with superimposed right extraforaminal disc protrusion contacting the exiting right L4 nerve root. Unchanged mild right lateral recess and bilateral neuroforaminal stenosis. No spinal canal stenosis. L5-S1: Unchanged small left paracentral disc protrusion and mild bilateral facet arthropathy. No stenosis. IMPRESSION: 1. New and progressive thoracic and lumbosacral osseous metastatic disease without significant extraosseous extension or pathologic fracture. 2. Enlarging bilateral iliac bone and upper abdominal nodal metastatic disease. 3. Unchanged multilevel lumbar spondylosis as described above. Right extraforaminal disc protrusion contacts and potentially irritates the  exiting right L4 nerve root. Electronically Signed   By: Titus Dubin M.D.   On: 08/25/2021 14:55   CT ABDOMEN PELVIS W CONTRAST  Result Date: 08/25/2021 CLINICAL DATA:  61 year old with acute abdominal pain. History of metastatic lung cancer. EXAM: CT ABDOMEN AND PELVIS WITH CONTRAST TECHNIQUE: Multidetector CT imaging of the abdomen and pelvis was performed using the standard protocol following bolus administration of intravenous contrast. RADIATION DOSE REDUCTION: This exam was performed according to the departmental dose-optimization program which includes automated exposure control, adjustment of the mA and/or kV according to patient  size and/or use of iterative reconstruction technique. CONTRAST:  172m OMNIPAQUE IOHEXOL 300 MG/ML  SOLN COMPARISON:  CTA chest, abdomen and pelvis 05/27/2021 and CT of the chest, abdomen and pelvis on 06/18/2021 and PET/CT 06/22/2021. Thoracic and lumbar MRI 07/30/2021. Abdominal ultrasound 08/24/2021. FINDINGS: Lower chest: Again noted are small pulmonary nodules in the right lower lobe. There is an dominant nodule in the right lower lobe that measures approximately 6 mm minimally changed since 05/27/2021. However, there is a new punctate nodule in the medial right lower lobe on sequence 6, image 25. In addition, there appears to be a new 3 mm nodule in the right lower lobe on sequence 6 image 7. New punctate nodule in the right middle lobe on image 22. Findings are suggestive for small lung metastases. Hepatobiliary: There are innumerable low-density lesions scattered throughout the liver of which are new and suggestive for hepatic metastatic disease. In addition, there is diffuse narrowing of the main right portal vein and evidence for thrombus involving right intrahepatic portal venous branches. The main and left intrahepatic portal veins appear to be patent. Poorly defined low-density structures throughout the right hepatic lobe are most with metastatic disease, most prominent in segment 8. Gallbladder is mildly distended. Mild pericholecystic fluid associated with the gallbladder. Difficult to exclude intrahepatic biliary dilatation in the right hepatic lobe. Pancreas: Unremarkable. No pancreatic ductal dilatation or surrounding inflammatory changes. Spleen: There are least 2 small low-density structures in the spleen that are stable. Spleen is normal in size. Adrenals/Urinary Tract: Normal adrenal glands. Normal appearance of both kidneys without hydronephrosis. No suspicious renal lesions. No definite kidney stones. Normal appearance of the urinary bladder. Stomach/Bowel: Stomach is within normal limits.  Appendix appears normal. No evidence of bowel wall thickening, distention, or inflammatory changes. Vascular/Lymphatic: Atherosclerotic disease in the abdominal aorta without aneurysm. Main visceral arteries are patent. Enlarged low-density lymph node in the upper abdominal pre caval region on sequence 2 image 29. This precaval lymph node measures 1.5 cm in short axis and previously measured 1.3 cm on 06/22/2021. Enlarged retroperitoneal lymph nodes around the IVC. There is another index lymph node between the IVC and the aorta on image 34 that measures 1.0 cm in short axis. Thrombosis of right portal vein branches. Narrowing of the main right portal vein appears new. Main portal vein and portal vein confluence appear to be patent although the main portal vein is adjacent to the enlarged precaval lymph node. Reproductive: Prostate is unremarkable. Other: No significant ascites other than the minimal fluid around the gallbladder. Umbilical hernia containing fat. Bilateral inguinal hernias containing fat. Musculoskeletal: Expansile lytic lesion involving the anterior aspect of the right iliac wing that measures 3.4 x 3.1 cm and markedly enlarged since 06/18/2021. Again noted is lucency along the anterior L4 vertebral body and suggestive for a lucent bone lesion. There is also a lesion along the posteroinferior aspect of the T11 vertebral body suggestive for  bone metastasis. Again noted is a lucent lesion involving the left ilium on sequence 2 image 54. IMPRESSION: 1. Imaging findings are compatible with progression of metastatic disease. New hepatic metastasis with new small pulmonary nodules, enlarged upper abdominal lymph nodes and progression of bone disease. 2. Significant change in the appearance of the liver with innumerable hepatic lesions. 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 The main right portal vein is also narrowed compared  to the previous examination. 3. Small amount of pericholecystic fluid is nonspecific. Gallbladder is mildly distended. Findings are similar to the recent ultrasound findings. These findings could be associated with the hepatic disease. Ultrasound and CT findings are equivocal for cholecystitis and consider further evaluation of the gallbladder with a HIDA scan (if clinically indicated). 4. Scattered bony lesions compatible with metastatic disease. Enlargement of a lytic expansile lesion involving the right iliac wing. 5. Umbilical hernia.  Bilateral inguinal hernias. 6.  Aortic Atherosclerosis (ICD10-I70.0). These results were called by telephone at the time of interpretation on 08/25/2021 at 11:24 am to provider [facility] , who verbally acknowledged these results. Electronically Signed   By: Markus Daft M.D.   On: 08/25/2021 11:24   CT L-SPINE NO CHARGE  Result Date: 08/25/2021 CLINICAL DATA:  Worsening back and right leg pain. History of metastatic lung cancer. EXAM: CT LUMBAR SPINE WITHOUT CONTRAST TECHNIQUE: Multidetector CT imaging of the lumbar spine was performed without intravenous contrast administration. Multiplanar CT image reconstructions were also generated. RADIATION DOSE REDUCTION: This exam was performed according to the departmental dose-optimization program which includes automated exposure control, adjustment of the mA and/or kV according to patient size and/or use of iterative reconstruction technique. COMPARISON:  CT lumbar myelogram dated August 20, 2021. MRI thoracic and lumbar spine dated Jul 30, 2021. FINDINGS: Segmentation: 5 lumbar type vertebrae. Alignment: Unchanged levoscoliosis. Unchanged mild retrolisthesis at L3-L4. Vertebrae: Small lytic lesions in the T11 and L4 vertebral bodies. No acute fracture. Paraspinal and other soft tissues: Please see separate CT abdomen pelvis report from same day. Incompletely visualized diffuse hepatic metastatic disease. Portacaval and aortocaval  lymphadenopathy. Unchanged lytic lesion in the left iliac bone. Aortoiliac atherosclerotic vascular disease. Disc levels: Multilevel degenerative changes are unchanged since lumbar myelogram 5 days ago, including a right extraforaminal disc protrusion at L4-L5 closely approximating and potentially irritating the right L4 nerve root. IMPRESSION: 1. No acute osseous abnormality. 2. Unchanged bony metastases in the T11 and L4 vertebral bodies and left iliac bone. 3. Incompletely visualized intra-abdominal metastatic disease involving the liver and upper abdominal lymph nodes. 4. Aortic Atherosclerosis (ICD10-I70.0). Electronically Signed   By: Titus Dubin M.D.   On: 08/25/2021 10:56   US Abdomen Limited  Result Date: 08/24/2021 CLINICAL DATA:  Elevated LFTs with right-sided abdominal pain. History of metastatic lung cancer. EXAM: ULTRASOUND ABDOMEN LIMITED RIGHT UPPER QUADRANT COMPARISON:  PET-CT June 22, 2021 FINDINGS: Gallbladder: Biliary sludge with gallbladder calculi measuring up to 3 mm. Wall thickening measuring up to 8 mm with some pericholecystic fluid. No sonographic Murphy sign noted by sonographer. Common bile duct: Diameter: 9 mm Liver: Heterogeneous appearance of the hepatic parenchyma with multiple small hepatic lesions identified measuring up to 1.6 cm. Portal vein is patent on color Doppler imaging with normal direction of blood flow towards the liver. Other: None. IMPRESSION: 1. Heterogeneous appearance of the hepatic parenchyma with multiple small hepatic lesions identified, suspicious for metastatic disease. 2. Cholelithiasis and sludge with  gallbladder wall thickening measuring up to 8 mm but with a negative sonographic Murphy sign. Findings which are favored to reflect sequela of hepatic disease. If continued clinical concern for cholecystitis recommend further evaluation with nuclear medicine HIDA scan. 3. Prominence of the common bile duct measuring 9 mm, recommend correlation with  laboratory values to assess for biliary obstruction. Electronically Signed   By: Dahlia Bailiff M.D.   On: 08/24/2021 15:31   DG MYELOGRAPHY LUMBAR INJ LUMBOSACRAL  Result Date: 08/20/2021 CLINICAL DATA:  Disc displacement, lumbar. Right lower extremity radiculopathy extending to lateral anterior thigh without relief from L4-5 epidural steroid injection. EXAM: LUMBAR MYELOGRAM FLUOROSCOPY: Radiation Exposure Index (as provided by the fluoroscopic device): Dose area product 716.04 uGy*m2 PROCEDURE: After thorough discussion of risks and benefits of the procedure including bleeding, infection, injury to nerves, blood vessels, adjacent structures as well as headache and CSF leak, written and oral informed consent was obtained. Consent was obtained by Dr. San Morelle. Time out form was completed. Patient was positioned prone on the fluoroscopy table. Local anesthesia was provided with 1% lidocaine without epinephrine after prepped and draped in the usual sterile fashion. Puncture was performed at L2-3 using a 3 1/2 inch 22-gauge spinal needle via left paramidline approach. Using a single pass through the dura, the needle was placed within the thecal sac, with return of clear CSF. 15 mL of Isovue M-200 was injected into the thecal sac, with normal opacification of the nerve roots and cauda equina consistent with free flow within the subarachnoid space. I personally performed the lumbar puncture and administered the intrathecal contrast. I also personally supervised acquisition of the myelogram images. TECHNIQUE: Contiguous axial images were obtained through the Lumbar spine after the intrathecal infusion of infusion. Coronal and sagittal reconstructions were obtained of the axial image sets. COMPARISON:  MRI of the lumbar spine without contrast 07/30/2021 FINDINGS: LUMBAR MYELOGRAM FINDINGS: Five non rib-bearing lumbar type vertebral bodies are present. Subtle changes are noted at the known L4 lesion.  Vertebral body heights are maintained. Slight retrolisthesis is present at L3-4. This is slightly worse with extension. No other significant listhesis is present. Atherosclerotic calcifications are present in the aorta without aneurysm. Broad-based disc protrusion is present at L3-4. This results in moderate central canal stenosis subarticular narrowing bilaterally with right subarticular narrowing. Right subarticular narrowing is also present at L4-5 secondary to a broad-based disc protrusion. The nerve roots otherwise fill normally on both sides. CT LUMBAR MYELOGRAM FINDINGS: Lumbar spine is imaged from T12 through S2. Vertebral body heights are maintained. Lucent lesion anteriorly at L4 measures 9 mm cephalo caudad, compatible with the suspected metastasis. Multiple lucent lesions are again noted within the left iliac bone. There is destruction of the medial cortex. Atherosclerotic calcifications are present within the aorta and branch vessels. Visualized abdomen is otherwise unremarkable. No significant retroperitoneal adenopathy is present. Mild leftward curvature of the lumbar spine is centered at L3-4. T12-L1: Negative. L1-2: Mild facet hypertrophy is present bilaterally. No significant stenosis is present. L2-3: Mild facet hypertrophy is present. No significant disc protrusion or stenosis is present. L3-4: A broad-based disc protrusion is asymmetric to the right. A left-sided synovial cyst creates some mass effect on the posterior canal. Right greater than left subarticular narrowing is present. Moderate foraminal narrowing is worse right than left. L4-5: A broad-based disc protrusion is again seen. Left laminectomy noted. The canal is decompressed posteriorly on the left. Facet spurring and disc protrusions result in moderate foraminal stenosis, right greater than  left. Mild subarticular narrowing is worse right than left. L5-S1: A shallow central disc protrusion is present without significant stenosis.  IMPRESSION: 1. Left laminectomy at L4-5 with decompression of the canal posteriorly on the left. 2. Moderate foraminal stenosis bilaterally at L4-5 is worse on the right. 3. Mild subarticular narrowing at L4-5 is worse right than left. 4. Moderate central canal stenosis at L3-4 with right greater than left subarticular narrowing. 5. Mild retrolisthesis at L3-4 is slightly exacerbated with extension. 6. Moderate foraminal narrowing bilaterally at L3-4 is worse on the right. 7. Suspected metastases at L4 and in the left iliac bone. 8. Mild facet hypertrophy at L1-2 and L2-3 without significant stenosis. 9. Aortic Atherosclerosis (ICD10-I70.0). Electronically Signed   By: San Morelle M.D.   On: 08/20/2021 12:13   CT LUMBAR SPINE W CONTRAST  Result Date: 08/20/2021 CLINICAL DATA:  Disc displacement, lumbar. Right lower extremity radiculopathy extending to lateral anterior thigh without relief from L4-5 epidural steroid injection. EXAM: LUMBAR MYELOGRAM FLUOROSCOPY: Radiation Exposure Index (as provided by the fluoroscopic device): Dose area product 716.04 uGy*m2 PROCEDURE: After thorough discussion of risks and benefits of the procedure including bleeding, infection, injury to nerves, blood vessels, adjacent structures as well as headache and CSF leak, written and oral informed consent was obtained. Consent was obtained by Dr. San Morelle. Time out form was completed. Patient was positioned prone on the fluoroscopy table. Local anesthesia was provided with 1% lidocaine without epinephrine after prepped and draped in the usual sterile fashion. Puncture was performed at L2-3 using a 3 1/2 inch 22-gauge spinal needle via left paramidline approach. Using a single pass through the dura, the needle was placed within the thecal sac, with return of clear CSF. 15 mL of Isovue M-200 was injected into the thecal sac, with normal opacification of the nerve roots and cauda equina consistent with free flow within  the subarachnoid space. I personally performed the lumbar puncture and administered the intrathecal contrast. I also personally supervised acquisition of the myelogram images. TECHNIQUE: Contiguous axial images were obtained through the Lumbar spine after the intrathecal infusion of infusion. Coronal and sagittal reconstructions were obtained of the axial image sets. COMPARISON:  MRI of the lumbar spine without contrast 07/30/2021 FINDINGS: LUMBAR MYELOGRAM FINDINGS: Five non rib-bearing lumbar type vertebral bodies are present. Subtle changes are noted at the known L4 lesion. Vertebral body heights are maintained. Slight retrolisthesis is present at L3-4. This is slightly worse with extension. No other significant listhesis is present. Atherosclerotic calcifications are present in the aorta without aneurysm. Broad-based disc protrusion is present at L3-4. This results in moderate central canal stenosis subarticular narrowing bilaterally with right subarticular narrowing. Right subarticular narrowing is also present at L4-5 secondary to a broad-based disc protrusion. The nerve roots otherwise fill normally on both sides. CT LUMBAR MYELOGRAM FINDINGS: Lumbar spine is imaged from T12 through S2. Vertebral body heights are maintained. Lucent lesion anteriorly at L4 measures 9 mm cephalo caudad, compatible with the suspected metastasis. Multiple lucent lesions are again noted within the left iliac bone. There is destruction of the medial cortex. Atherosclerotic calcifications are present within the aorta and branch vessels. Visualized abdomen is otherwise unremarkable. No significant retroperitoneal adenopathy is present. Mild leftward curvature of the lumbar spine is centered at L3-4. T12-L1: Negative. L1-2: Mild facet hypertrophy is present bilaterally. No significant stenosis is present. L2-3: Mild facet hypertrophy is present. No significant disc protrusion or stenosis is present. L3-4: A broad-based disc protrusion  is asymmetric to  the right. A left-sided synovial cyst creates some mass effect on the posterior canal. Right greater than left subarticular narrowing is present. Moderate foraminal narrowing is worse right than left. L4-5: A broad-based disc protrusion is again seen. Left laminectomy noted. The canal is decompressed posteriorly on the left. Facet spurring and disc protrusions result in moderate foraminal stenosis, right greater than left. Mild subarticular narrowing is worse right than left. L5-S1: A shallow central disc protrusion is present without significant stenosis. IMPRESSION: 1. Left laminectomy at L4-5 with decompression of the canal posteriorly on the left. 2. Moderate foraminal stenosis bilaterally at L4-5 is worse on the right. 3. Mild subarticular narrowing at L4-5 is worse right than left. 4. Moderate central canal stenosis at L3-4 with right greater than left subarticular narrowing. 5. Mild retrolisthesis at L3-4 is slightly exacerbated with extension. 6. Moderate foraminal narrowing bilaterally at L3-4 is worse on the right. 7. Suspected metastases at L4 and in the left iliac bone. 8. Mild facet hypertrophy at L1-2 and L2-3 without significant stenosis. 9. Aortic Atherosclerosis (ICD10-I70.0). Electronically Signed   By: San Morelle M.D.   On: 08/20/2021 12:13    Microbiology: Results for orders placed or performed during the hospital encounter of 08/25/21  Culture, blood (Routine X 2) w Reflex to ID Panel     Status: None   Collection Time: 08/25/21  3:01 PM   Specimen: Right Antecubital; Blood  Result Value Ref Range Status   Specimen Description   Final    RIGHT ANTECUBITAL BLOOD Performed at [facility], [address] [place], [place] [address]    Special Requests   Final    Blood Culture adequate volume BOTTLES DRAWN AEROBIC AND ANAEROBIC Performed at [facility], [address], [place], [place] [address]    Culture   Final    NO GROWTH 5  DAYS Performed at [facility], [place] [address], [place], [place] [address]    Report Status 08/30/2021 FINAL  Final  Culture, blood (Routine X 2) w Reflex to ID Panel     Status: Abnormal   Collection Time: 08/25/21  5:30 PM   Specimen: BLOOD  Result Value Ref Range Status   Specimen Description BLOOD SITE NOT SPECIFIED  Final   Special Requests IN PEDIATRIC BOTTLE Blood Culture adequate volume  Final   Culture  Setup Time   Final    GRAM POSITIVE COCCI IN CLUSTERS IN PEDIATRIC BOTTLE CRITICAL RESULT CALLED TO, READ BACK BY AND VERIFIED WITH: L POINDEXTER,[email or web address]  08/26/21 [place]    Culture (A)  Final    STAPHYLOCOCCUS EPIDERMIDIS THE SIGNIFICANCE OF ISOLATING THIS ORGANISM FROM A SINGLE VENIPUNCTURE CANNOT BE PREDICTED WITHOUT FURTHER CLINICAL AND CULTURE CORRELATION. SUSCEPTIBILITIES AVAILABLE ONLY ON REQUEST. Performed at [facility], [place] [address], [place], [place] [address]    Report Status 08/28/2021 FINAL  Final  Blood Culture ID Panel (Reflexed)     Status: Abnormal   Collection Time: 08/25/21  5:30 PM  Result Value Ref Range Status   Enterococcus faecalis NOT DETECTED NOT DETECTED Final   Enterococcus Faecium NOT DETECTED NOT DETECTED Final   Listeria monocytogenes NOT DETECTED NOT DETECTED Final   Staphylococcus species DETECTED (A) NOT DETECTED Final    Comment: CRITICAL RESULT CALLED TO, READ BACK BY AND VERIFIED WITH: L POINDEXTER,[email or web address]  08/26/21 [place]    Staphylococcus aureus (BCID) NOT DETECTED NOT DETECTED Final   Staphylococcus epidermidis DETECTED (A) NOT DETECTED Final  Comment: CRITICAL RESULT CALLED TO, READ BACK BY AND VERIFIED WITH: L POINDEXTER,[email or web address]  08/26/21 [place]    Staphylococcus lugdunensis NOT DETECTED NOT DETECTED Final   Streptococcus species NOT DETECTED NOT DETECTED Final   Streptococcus agalactiae NOT DETECTED NOT DETECTED Final   Streptococcus pneumoniae NOT DETECTED NOT DETECTED Final   Streptococcus pyogenes NOT DETECTED  NOT DETECTED Final   A.calcoaceticus-baumannii NOT DETECTED NOT DETECTED Final   Bacteroides fragilis NOT DETECTED NOT DETECTED Final   Enterobacterales NOT DETECTED NOT DETECTED Final   Enterobacter cloacae complex NOT DETECTED NOT DETECTED Final   Escherichia coli NOT DETECTED NOT DETECTED Final   Klebsiella aerogenes NOT DETECTED NOT DETECTED Final   Klebsiella oxytoca NOT DETECTED NOT DETECTED Final   Klebsiella pneumoniae NOT DETECTED NOT DETECTED Final   Proteus species NOT DETECTED NOT DETECTED Final   Salmonella species NOT DETECTED NOT DETECTED Final   Serratia marcescens NOT DETECTED NOT DETECTED Final   Haemophilus influenzae NOT DETECTED NOT DETECTED Final   Neisseria meningitidis NOT DETECTED NOT DETECTED Final   Pseudomonas aeruginosa NOT DETECTED NOT DETECTED Final   Stenotrophomonas maltophilia NOT DETECTED NOT DETECTED Final   Candida albicans NOT DETECTED NOT DETECTED Final   Candida auris NOT DETECTED NOT DETECTED Final   Candida glabrata NOT DETECTED NOT DETECTED Final   Candida krusei NOT DETECTED NOT DETECTED Final   Candida parapsilosis NOT DETECTED NOT DETECTED Final   Candida tropicalis NOT DETECTED NOT DETECTED Final   Cryptococcus neoformans/gattii NOT DETECTED NOT DETECTED Final   Methicillin resistance mecA/C NOT DETECTED NOT DETECTED Final    Comment: Performed at [facility], [address], [place], [place] [address]    Labs: CBC: Recent Labs  Lab 08/30/21 0519 08/31/21 0503 09/01/21 0323 09/02/21 1250 09/03/21 0535  WBC 10.7* 12.6* 12.3* 12.4* 12.9*  NEUTROABS  --   --  9.1*  --  10.2*  HGB 10.6* 10.7* 10.9* 12.0* 11.8*  HCT 32.9* 32.3* 33.4* 36.8* 36.5*  MCV 94.8 93.9 94.4 95.3 94.8  PLT 475* 486* 458* 481* 371*   Basic Metabolic Panel: Recent Labs  Lab 08/29/21 0616 08/30/21 0519 08/31/21 0503 09/01/21 0323 09/03/21 0535  NA 138 136 136 136 133*  K 4.4 4.2 4.6 4.0 4.0  CL 107 101 101 99 98  CO2 _1 GLUCOSE  190* 172* 147* 113* 138*  BUN _2 CREATININE 0.51* 0.52* 0.57* 0.61 0.54*  CALCIUM 8.6* 8.7* 8.9 8.9 8.8*  MG  --   --   --  2.1  --   PHOS  --   --   --  3.0  --    Liver Function Tests: Recent Labs  Lab 08/29/21 0616 08/30/21 0519 08/31/21 0503 09/01/21 0323 09/03/21 0535  AST 63* 49* 42* 39 48*  ALT 52* 48* 42 39 44  ALKPHOS 858* 746* 758* 765* 850*  BILITOT 0.4 1.1 1.0 0.9 1.3*  PROT 6.0* 6.1* 6.5 6.4* 7.0  ALBUMIN 2.6* 2.7* 2.7* 2.9* 3.2*   CBG: Recent Labs  Lab 09/02/21 1129 09/02/21 1620 09/02/21 2100 09/03/21 0824 09/03/21 1251  GLUCAP 119* 188* 154* 125* 177*    Discharge time spent: greater than 30 minutes.  Signed: Elmarie Shiley, MD [facility] 09/03/2021

## 2021-09-03 NOTE — TOC Initial Note (Signed)
Transition of Care [facility]) - Initial/Assessment Note    Patient Details  Name: Bill Garza MRN: [id number] Date of Birth: 1960/09/06  Transition of Care [facility]) CM/SW Contact:    Lynnell Catalan, RN Phone Number: 09/03/2021, 1:51 PM  Clinical Narrative:                 Spoke with pt and wife at bedside for dc planning. Choice offered for home health services and [facility] chosen. [facility] liaison contacted for referral. Orders for wheelchair, rolling walker and oxygen received. Medicare does not pay for 2 walking implements at one time. Wife states that they would prefer the rolling walker. [employer] liaison contacted for RW and home 02 to be delivered to pt room.   Expected Discharge Plan: [place] Barriers to Discharge: No Barriers Identified   Patient Goals and CMS Choice Patient states their goals for this hospitalization and ongoing recovery are:: Return Home CMS Medicare.gov Compare Post Acute Care list provided to:: Patient Choice offered to / list presented to : Patient  Expected Discharge Plan and Services Expected Discharge Plan: [place]   Discharge Planning Services: CM Consult Post Acute Care Choice: [place] arrangements for the past 2 months: Single Family Home Expected Discharge Date: 09/03/21               DME Arranged: Oxygen, Walker rolling DME Agency: [employer] Date DME Agency Contacted: 09/03/21 Time DME Agency Contacted: 1300 Representative spoke with at DME Agency: Clarisse Gouge HH Arranged: PT, OT [place] Agency: [place] Date [place]: 09/03/21 Time [place]: [place] Representative spoke with at [place]: [place] Arrangements/Services Living arrangements for the past 2 months: [place] Lives with:: Spouse Patient language and need for interpreter reviewed:: Yes Do you feel safe going back to the place where you live?: Yes      Need for Family  Participation in Patient Care: Yes (Comment) Care giver support system in place?: Yes (comment)   Criminal Activity/Legal Involvement Pertinent to Current Situation/Hospitalization: No - Comment as needed  Activities of Daily Living Home Assistive Devices/Equipment: Cane (specify quad or straight) ADL Screening (condition at time of admission) Patient's cognitive ability adequate to safely complete daily activities?: Yes Is the patient deaf or have difficulty hearing?: No Does the patient have difficulty seeing, even when wearing glasses/contacts?: No Does the patient have difficulty concentrating, remembering, or making decisions?: No Patient able to express need for assistance with ADLs?: Yes Does the patient have difficulty dressing or bathing?: Yes Independently performs ADLs?: No Communication: Independent Dressing (OT): Needs assistance Is this a change from baseline?: Pre-admission baseline Grooming: Needs assistance Is this a change from baseline?: Pre-admission baseline Feeding: Independent Bathing: Needs assistance Is this a change from baseline?: Pre-admission baseline Toileting: Needs assistance Is this a change from baseline?: Pre-admission baseline In/Out Bed: Needs assistance Is this a change from baseline?: Pre-admission baseline Walks in Home: Needs assistance Is this a change from baseline?: Pre-admission baseline Does the patient have difficulty walking or climbing stairs?: Yes Weakness of Legs: Both Weakness of Arms/Hands: Both  Permission Sought/Granted Permission sought to share information with : Facility Art therapist granted to share information with : Yes, Verbal Permission Granted     Permission granted to share info w AGENCY: [facility]        Emotional Assessment Appearance:: Appears older than stated age Attitude/Demeanor/Rapport: Engaged Affect (typically observed): Quiet Orientation: :  Oriented to Self, Oriented to  Place, Oriented to  Time, Oriented to Situation Alcohol / Substance Use: Not Applicable Psych Involvement: No (comment)  Admission diagnosis:  Intractable pain [R52] Intractable back pain [M54.9] Metastatic malignant neoplasm, unspecified site [facility]) [C79.9] Patient Active Problem List   Diagnosis Date Noted   Malnutrition of moderate degree 09/01/2021   Generalized pain    General weakness    Constipation    Intractable pain 08/25/2021   SIRS (systemic inflammatory response syndrome) (HCC) 08/25/2021   Normocytic anemia 08/25/2021   Hyponatremia 08/25/2021   Anxiety 08/25/2021   Low back pain 07/30/2021   HTN (hypertension) 07/30/2021   HLD (hyperlipidemia) 07/30/2021   DM2 (diabetes mellitus, type 2) ([place]) 07/30/2021   Gastroparesis 07/30/2021   Chronic constipation 07/30/2021   Depression 07/30/2021   COPD (chronic obstructive pulmonary disease) ([place]) 07/30/2021   Tobacco abuse 07/30/2021   GERD (gastroesophageal reflux disease) 07/30/2021   Elevated alkaline phosphatase level 07/30/2021   Secondary malignant neoplasm of bone and bone marrow ([place]) 07/13/2021   Malignant neoplasm metastatic to brain ([place]) 03/09/2021   Non-small cell lung cancer metastatic to brain ([place]) 01/27/2021   Goals of care, counseling/discussion 01/27/2021   PCP:  Aleen Campi, NP Pharmacy:   [place] [place] - [address] [place] [address] Phone: [phone] Fax: [phone]     Social Determinants of Health (SDOH) Interventions    Readmission Risk Interventions    09/03/2021    1:49 PM 08/27/2021    3:03 PM  Readmission Risk Prevention Plan  Transportation Screening Complete Complete  PCP or Specialist Appt within 3-5 Days  Complete  HRI or Home Care Consult  Complete  Social Work Consult for [place] Planning/Counseling  Complete  Palliative Care Screening  Complete  Medication Review Press photographer) Complete Complete  PCP  or Specialist appointment within 3-5 days of discharge Complete   HRI or [place] Complete   SW Recovery Care/Counseling Consult Complete   Palliative Care Screening Complete   [place] Not Applicable

## 2021-09-03 NOTE — Telephone Encounter (Signed)
Spoke to pt wife, denise, regarding methadone titration. Per Lexine Baton, NP pt to take 1 10mg  tab on Saturday and Sunday, 1/2 tab (5mg ) on Monday-Wednesday, and to stop methadone on Thursday. [name] verbalized understanding. No further needs

## 2021-09-03 NOTE — Plan of Care (Signed)

## 2021-09-03 NOTE — Progress Notes (Signed)
Bill Garza is doing okay this morning.  He is not complain of any pain.  As such, I think that the nerve block is helped him.  He wants to try to decrease the interval that he takes the oxycodone.  I am not sure how much he is really eating.  I think this is clearly going to being a determinant as to how well he does.  His labs show white count 12.9.  Hemoglobin 11.8.  Platelet count 478,000.  His albumin is 3.2.  The sodium is 133.  Potassium 4.0.  BUN 13 creatinine 0.54.  Calcium 8.8.  He has had no cough.  There is no shortness of breath.  He has had no problems urinating.  I am not sure how much he is having with bowel movements.  There is been no problems with his Xarelto.  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  I think we will need to be on Xarelto long-term to try to help with thromboembolic disease.  He is definitely at a risk for thromboembolism.  Again I think it would be nice to get him home today.  I just think he would do better at home.  I think he would eat better.  I think he would be more ambulatory.  I know his family is incredibly motivated to help him.  I know he would like to go home.  There has been no fever.  His vital signs show a temperature of 97.4.  Pulse 92.  Blood pressure 123/78.  Oxygen saturation is 97%.  His lungs sound clear bilaterally.  He has good air movement bilaterally.  Cardiac exam regular rate and rhythm.  He has occasional extra beat.  There are no murmurs.  Abdomen is soft.  He has decent bowel sounds.  There is no obvious fluid wave.  There is no guarding or rebound tenderness.  I cannot palpate his liver.  Extremity shows some muscle atrophy in upper and lower extremities.  He has adequate strength although decreased.  Neurological exam shows no focal neurological deficits.  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  He has had the nerve block.  I think this is worked.  We will try to set him up with  a RFA as an outpatient.  As far as systemic therapy goes for his cancer, this is can be tricky.  I wish that he is in better nutritional shape.  Again if he gets home, he will eat better.  I know his wife will do a great job and make sure that he has good nutrition.  I know we can treat the cancer.  What we use as far as our protocol will really be dictated by his performance status.  Again, it would be nice to get him home today.  Maybe, he can get home physical therapy.  I appreciate everybody's help with him on 6 E.  Kerby Nora, MD  Pro[place] 12:25

## 2021-09-03 NOTE — Progress Notes (Signed)
Daily Progress Note   Patient Name: Bill Garza       Date: 09/03/2021 DOB: 07/07/1960  Age: 61 y.o. MRN#: [id number] Attending Physician: Elmarie Shiley, MD Primary Care Physician: Aleen Campi, NP Admit Date: 08/25/2021  Reason for Consultation/Follow-up: Pain control  Subjective: Chart reviewed. Updates received. Patient examined at bedside.   Bill Garza is resting comfortably. Easily awakened to verbal stimuli. Denies pain. Is appreciative of how well he is feeling. Wife is at bedside. Shares patient may potentially discharge home today. This will be good for him as he is looking forward to returning home. Wife is hopeful this will allow him to continue to show some improvement functionally and nutritionally.   We discussed pain regimen. His pain is much improved since nerve block. Goal would be to wean down off of the methadone. Education provided on not abruptly discontinuing. He has tolerated reduction to twice daily. They are aware we will decrease frequency to once daily with a goal of discontinuing. Also discussed use of oxycodone as needed for pain relief however if patient is having mild aches and discomfort advised to take Tylenol ES. Wife and patient verbalized understanding.   All question answered and support provided.   Length of Stay: 8  Current Medications: Scheduled Meds:   Chlorhexidine Gluconate Cloth  6 each Topical Daily   dronabinol  5 mg Oral BID AC   escitalopram  5 mg Oral Daily   feeding supplement  1 Container Oral TID BM   insulin aspart  0-15 Units Subcutaneous TID WC   lactulose  13.3333-20 g Oral BID   mouth rinse  15 mL Mouth Rinse BID   [START ON 09/04/2021] methadone  5 mg Oral Daily   mometasone-formoterol  2 puff Inhalation BID   montelukast   10 mg Oral QHS   multivitamin with minerals  1 tablet Oral Daily   pantoprazole  40 mg Oral BID   predniSONE  20 mg Oral Q breakfast   Rivaroxaban  15 mg Oral BID WC   Followed by   Derrill Memo ON 09/21/2021] rivaroxaban  20 mg Oral Q supper   senna-docusate  2 tablet Oral BID    Continuous Infusions:    PRN Meds: albuterol, cyclobenzaprine, labetalol, LORazepam, naLOXone (NARCAN)  injection, ondansetron (ZOFRAN) IV, oxyCODONE, polyethylene glycol  Physical  Exam         Awake alert oriented  Normal breathing pattern  Generalized weakness  Mood appropriate, AAOx3  Vital Signs: BP 123/78 (BP Location: Left Arm)   Pulse 92   Temp (!) 97.4 F (36.3 C) (Oral)   Resp 16   Ht 6' (1.829 m)   Wt 72.1 kg   SpO2 97%   BMI 21.56 kg/m  SpO2: SpO2: 97 % O2 Device: O2 Device: Nasal Cannula O2 Flow Rate: O2 Flow Rate (L/min): 2 L/min  Intake/output summary:  Intake/Output Summary (Last 24 hours) at 09/03/2021 1105 Last data filed at 09/03/2021 0700 Gross per 24 hour  Intake 240 ml  Output 775 ml  Net -535 ml    LBM: Last BM Date : 08/30/21 Baseline Weight: Weight: 72.1 kg Most recent weight: Weight: 72.1 kg       Palliative Assessment/Data:      Patient Active Problem List   Diagnosis Date Noted   Malnutrition of moderate degree 09/01/2021   Generalized pain    General weakness    Constipation    Intractable pain 08/25/2021   SIRS (systemic inflammatory response syndrome) (HCC) 08/25/2021   Normocytic anemia 08/25/2021   Hyponatremia 08/25/2021   Anxiety 08/25/2021   Low back pain 07/30/2021   HTN (hypertension) 07/30/2021   HLD (hyperlipidemia) 07/30/2021   DM2 (diabetes mellitus, type 2) (HCC) 07/30/2021   Gastroparesis 07/30/2021   Chronic constipation 07/30/2021   Depression 07/30/2021   COPD (chronic obstructive pulmonary disease) (HCC) 07/30/2021   Tobacco abuse 07/30/2021   GERD (gastroesophageal reflux disease) 07/30/2021   Elevated alkaline phosphatase  level 07/30/2021   Secondary malignant neoplasm of bone and bone marrow ([place]) 07/13/2021   Malignant neoplasm metastatic to brain ([place]) 03/09/2021   Non-small cell lung cancer metastatic to brain ([place]) 01/27/2021   Goals of care, counseling/discussion 01/27/2021    Palliative Care Assessment & Plan   Patient Profile:    Assessment: 61 year old gentleman who lives at home with his wife in [place], [place], history of non-small cell lung cancer with mets to brain/liver/spine/hip, history of chronic pain tobacco use diabetes COPD anxiety/depression and GERD. Patient admitted to hospital medicine service for intractable back pain and abdominal pain with imaging showing new portal vein thrombosis, started on anticoagulation, oncology colleagues following, MR lumbar thoracic spine showing new and progressive thoracic and lumbosacral osseous metastatic disease burden, enlarging bilateral iliac bone and upper abdominal nodal metastatic disease.  Also has multilevel lumbar spondylosis. Palliative medicine team following for pain management.  Recommendations/Plan:  Nerve block on 6/12. Tolerated well. Pain is much improved post procedure. Denies pain.  Will wean off of methadone. Patient and wife aware of plan with a goal of discontinuing. Education provided on decreasing frequency to 10 mg once daily over the next 4 days, then 5 mg for 4 days and then discontinue.  Oxy IR 10 mg every 4 hours as needed  Ativan every 8 hrs as needed for anxiety Zofran as needed for nausea Tylenol ES for mild aches and pain  Palliative will continue to support and follow as needed. Wife knows to contact our office as needed.   Goals of Care and Additional Recommendations: Limitations on Scope of Treatment: Full Scope Treatment  Code Status:    Code Status Orders  (From admission, onward)           Start     Ordered   08/25/21 1527  Full code  Continuous  08/25/21 1526          Prognosis:  Unable to determine  Discharge Planning: To Be Determined  Care plan was discussed with  patient, wife, and Dr. Tyrell Antonio.   Thank you for allowing the Palliative Medicine Team to assist in the care of this patient.    Billing based on MDM: High   Problems Addressed: One acute or chronic illness or injury with exacerbation, progression, cancer, and pain.    Amount and/or Complexity of Data: Category 3:Discussion of management or test interpretation with external physician/other qualified health care professional/appropriate source (not separately reported). Any controlled substances utilized were prescribed in the context of palliative care.   Risks: Controlled Substance    Time Total: 35 min   Visit consisted of counseling and education dealing with the complex and emotionally intense issues of symptom management and palliative care in the setting of serious and potentially life-threatening illness.Greater than 50%  of this time was spent counseling and coordinating care related to the above assessment and plan.  Alda Lea, AGPCNP-BC  Palliative Medicine Team/[place]

## 2021-09-06 ENCOUNTER — Other Ambulatory Visit: Payer: Self-pay | Admitting: Nurse Practitioner

## 2021-09-06 DIAGNOSIS — C7951 Secondary malignant neoplasm of bone: Secondary | ICD-10-CM

## 2021-09-06 DIAGNOSIS — C3490 Malignant neoplasm of unspecified part of unspecified bronchus or lung: Secondary | ICD-10-CM

## 2021-09-07 ENCOUNTER — Other Ambulatory Visit: Payer: Self-pay

## 2021-09-07 MED ORDER — ONDANSETRON HCL 8 MG PO TABS: 8.0000 mg | ORAL_TABLET | Freq: Three times a day (TID) | ORAL | 0 refills | Status: AC | PRN

## 2021-09-07 NOTE — Progress Notes (Signed)
Oral zofran ordered.

## 2021-09-08 ENCOUNTER — Inpatient Hospital Stay (HOSPITAL_BASED_OUTPATIENT_CLINIC_OR_DEPARTMENT_OTHER): Payer: Medicare HMO | Admitting: Nurse Practitioner

## 2021-09-08 ENCOUNTER — Encounter: Payer: Self-pay | Admitting: Nurse Practitioner

## 2021-09-08 ENCOUNTER — Inpatient Hospital Stay: Payer: Medicare HMO

## 2021-09-08 ENCOUNTER — Other Ambulatory Visit: Payer: Self-pay | Admitting: Hematology & Oncology

## 2021-09-08 VITALS — BP 142/89 | HR 115 | Temp 98.2°F | Resp 19

## 2021-09-08 DIAGNOSIS — Z515 Encounter for palliative care: Secondary | ICD-10-CM | POA: Diagnosis not present

## 2021-09-08 DIAGNOSIS — R630 Anorexia: Secondary | ICD-10-CM

## 2021-09-08 DIAGNOSIS — Z79899 Other long term (current) drug therapy: Secondary | ICD-10-CM | POA: Diagnosis not present

## 2021-09-08 DIAGNOSIS — C7952 Secondary malignant neoplasm of bone marrow: Secondary | ICD-10-CM

## 2021-09-08 DIAGNOSIS — R110 Nausea: Secondary | ICD-10-CM

## 2021-09-08 DIAGNOSIS — C799 Secondary malignant neoplasm of unspecified site: Secondary | ICD-10-CM | POA: Diagnosis not present

## 2021-09-08 DIAGNOSIS — C7931 Secondary malignant neoplasm of brain: Secondary | ICD-10-CM | POA: Insufficient documentation

## 2021-09-08 DIAGNOSIS — C7951 Secondary malignant neoplasm of bone: Secondary | ICD-10-CM | POA: Diagnosis not present

## 2021-09-08 DIAGNOSIS — C3492 Malignant neoplasm of unspecified part of left bronchus or lung: Secondary | ICD-10-CM | POA: Diagnosis present

## 2021-09-08 DIAGNOSIS — G893 Neoplasm related pain (acute) (chronic): Secondary | ICD-10-CM

## 2021-09-08 DIAGNOSIS — F419 Anxiety disorder, unspecified: Secondary | ICD-10-CM

## 2021-09-08 MED ORDER — ONDANSETRON HCL 4 MG/2ML IJ SOLN: 8.0000 mg | Freq: Once | INTRAMUSCULAR | Status: DC

## 2021-09-08 MED ORDER — KETOROLAC TROMETHAMINE 10 MG PO TABS: 10.0000 mg | ORAL_TABLET | Freq: Four times a day (QID) | ORAL | 0 refills | Status: AC | PRN

## 2021-09-08 MED ORDER — SODIUM CHLORIDE 0.9 % IV SOLN: INTRAVENOUS | Status: AC

## 2021-09-08 MED ORDER — SODIUM CHLORIDE 0.9 % IV SOLN
20.0000 mg | Freq: Once | INTRAVENOUS | Status: AC
  Administered 2021-09-08: 20 mg via INTRAVENOUS
  Filled 2021-09-08: qty 20

## 2021-09-08 MED ORDER — HYDROMORPHONE HCL 1 MG/ML IJ SOLN
2.0000 mg | INTRAMUSCULAR | Status: DC | PRN
  Administered 2021-09-08 (×2): 2 mg via INTRAVENOUS
  Filled 2021-09-08 (×2): qty 2

## 2021-09-08 MED ORDER — OXYCODONE HCL 10 MG PO TABS: 10.0000 mg | ORAL_TABLET | Freq: Four times a day (QID) | ORAL | 0 refills | Status: AC | PRN

## 2021-09-08 MED ORDER — SODIUM CHLORIDE 0.9 % IV SOLN: 8.0000 mg | Freq: Once | INTRAVENOUS | Status: DC

## 2021-09-08 MED ORDER — ONDANSETRON HCL 4 MG/2ML IJ SOLN
8.0000 mg | Freq: Once | INTRAMUSCULAR | Status: DC
  Filled 2021-09-08: qty 4

## 2021-09-08 MED ORDER — ONDANSETRON HCL 4 MG/2ML IJ SOLN
8.0000 mg | Freq: Once | INTRAMUSCULAR | Status: AC
  Administered 2021-09-08: 8 mg via INTRAVENOUS

## 2021-09-08 NOTE — Progress Notes (Signed)
[place]  [phone]) [phone] [phone]) [phone]   Name: Bill Garza Date: 09/08/2021 MRN: [id number]  DOB: 11/17/1960  Patient Care Team: Aleen Campi, NP as PCP - General Pickenpack-Cousar, Carlena Sax, NP as Nurse Practitioner (Nurse Practitioner)   I connected with Lucia Bitter on 09/08/21 at 10:30 AM EDT by phone and verified that I am speaking with the correct person using two identifiers.   I discussed the limitations, risks, security and privacy concerns of performing an evaluation and management service by telemedicine and the availability of in-person appointments. I also discussed with the patient that there may be a patient responsible charge related to this service. The patient expressed understanding and agreed to proceed.   Other persons participating in the visit and their role in the encounter: Jemel Ono (wife) and [name], RN    Patient's location: Home   Provider's location: [place]    Chief Complaint: Ongoing pain    REASON FOR CONSULTATION: Bill Garza is a 61 y.o. 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 Recent hospitalization due to uncontrolled pain. Underwent nerve block with some relief, plans for outpatient ablation and possible osteocool procedure.  Palliative ask to see for symptom management and goals of care.    SOCIAL HISTORY:     reports that he quit smoking about 13 months ago. His smoking use included cigarettes. He has a 40.00 pack-year smoking history. He has never used smokeless tobacco. He reports that he does not currently use alcohol. He reports that he does not currently use drugs.  ADVANCE DIRECTIVES:  None on file   CODE STATUS:    PAST MEDICAL HISTORY: Past Medical History:  Diagnosis Date   Chronic back pain    Chronic GERD    Chronic pain    COPD (chronic obstructive pulmonary disease) (HCC)    Coronary artery disease    Depression    Diabetes mellitus without complication ([place])    Gastroparesis    Goals of care, counseling/discussion 01/27/2021   High risk medication use    Hyperlipidemia    MI (myocardial infarction) ([place])    Non-small cell lung cancer metastatic to brain ([place]) 01/27/2021   Restless leg syndrome     PAST SURGICAL HISTORY:  Past Surgical History:  Procedure Laterality Date   BACK SURGERY     CARDIAC CATHETERIZATION     IR FLUORO GUIDED NEEDLE PLC ASPIRATION/INJECTION LOC  08/30/2021    HEMATOLOGY/ONCOLOGY HISTORY:  Oncology History  Non-small cell lung cancer metastatic to brain ([place])  01/27/2021 Initial Diagnosis   Non-small cell lung cancer metastatic to brain ([place])   01/27/2021 Cancer Staging   Staging form: Lung, AJCC 8th Edition - Clinical stage from 01/27/2021: Stage IV (cT3, cN0, cM1) - Signed by [name], MD on 01/27/2021 Stage prefix: Initial diagnosis Histologic grade (G): G2 Histologic grading system: 4 grade system     ALLERGIES:  is allergic to gabapentin, levetiracetam, pregabalin, bupropion, duloxetine, ezetimibe, other, prochlorperazine, and lamotrigine.  MEDICATIONS:  Current Outpatient Medications  Medication Sig Dispense Refill   ketorolac (TORADOL) 10 MG tablet Take 1 tablet (10 mg total) by mouth every 6 (six) hours as needed for moderate pain. 60 tablet 0   albuterol (PROVENTIL) (2.5 MG/3ML) 0.083% nebulizer solution Take 2.5 mg by nebulization daily as  needed for shortness of breath or wheezing.     albuterol (VENTOLIN HFA) 108 (90 Base) MCG/ACT inhaler Inhale 2 puffs into the lungs every 4 (four) hours as needed for wheezing or shortness of breath. (Patient taking differently: Inhale 2 puffs into the lungs 2 (two) times daily as needed for  wheezing or shortness of breath.) 3 each 1   aspirin EC 81 MG tablet Take 81 mg by mouth daily.     Blood Glucose Monitoring Suppl (ACCU-CHEK GUIDE) w/Device KIT USE AS DIRECTED TO CHECK BLOOD SUGAR (Patient not taking: Reported on 07/26/2021)     Blood Glucose Monitoring Suppl (GLUCOCOM BLOOD GLUCOSE MONITOR) DEVI      budesonide-formoterol (SYMBICORT) 160-4.5 MCG/ACT inhaler Inhale 2 puffs into the lungs 2 (two) times daily. 3 each 1   cyclobenzaprine (FLEXERIL) 5 MG tablet Take 1 tablet (5 mg total) by mouth 3 (three) times daily as needed for muscle spasms. 30 tablet 0   dronabinol (MARINOL) 5 MG capsule Take 1 capsule (5 mg total) by mouth 2 (two) times daily before lunch and supper. 30 capsule 0   escitalopram (LEXAPRO) 5 MG tablet Take 5 mg by mouth daily.     fluocinolone (VANOS) 0.01 % cream Apply 1 application. topically daily as needed (rash).     lactulose (CHRONULAC) 10 GM/15ML solution Take 20-30 mLs by mouth 2 (two) times daily.     LORazepam (ATIVAN) 1 MG tablet Take 1 tablet (1 mg total) by mouth every 8 (eight) hours as needed for anxiety. 5 tablet 0   metFORMIN (GLUCOPHAGE) 500 MG tablet Take 500 mg by mouth daily.     methadone (DOLOPHINE) 10 MG tablet Take 1 tablet (10 mg total) by mouth daily. Patient to take 1 tablet (10 mg daily) Friday, Saturday, Sunday. On Monday begin taking 0.77m tablet (5 mg daily) until Wednesday 6/21. Patient will discontinue use on Thursday 6/22. 90 tablet 0   montelukast (SINGULAIR) 10 MG tablet Take 10 mg by mouth at bedtime.     Multiple Vitamin (MULTIVITAMIN) capsule Take 1 capsule by mouth daily.     naloxone (NARCAN) nasal spray 4 mg/0.1 mL Place into the nose.     ondansetron (ZOFRAN) 8 MG tablet Take 1 tablet (8 mg total) by mouth every 8 (eight) hours as needed for nausea or vomiting. 20 tablet 0   Oxycodone HCl 10 MG TABS Take 1 tablet (10 mg total) by mouth every 6 (six) hours as needed. 90 tablet 0   pantoprazole (PROTONIX) 40 MG tablet  Take 40 mg by mouth daily.     polyethylene glycol (MIRALAX / GLYCOLAX) 17 g packet Take 17 g by mouth daily. (Patient not taking: Reported on 08/13/2021)     Rivaroxaban (XARELTO) 15 MG TABS tablet Take 1 tablet (15 mg total) by mouth 2 (two) times daily with a meal for 17 days. 34 tablet 0   [START ON 09/21/2021] rivaroxaban (XARELTO) 20 MG TABS tablet Take 1 tablet (20 mg total) by mouth daily with supper. 30 tablet 3   senna-docusate (SENOKOT-S) 8.6-50 MG tablet Take 2 tablets by mouth 2 (two) times daily. 90 tablet 2   No current facility-administered medications for this visit.   Facility-Administered Medications Ordered in Other Visits  Medication Dose Route Frequency Provider Last Rate Last Admin   0.9 %  sodium chloride infusion   Intravenous Continuous EVolanda Napoleon MD 500 mL/hr at 09/08/21 1400 New Bag at 09/08/21 1400   dexamethasone (DECADRON) 20 mg in sodium  chloride 0.9 % 50 mL IVPB  20 mg Intravenous Once [name], MD 208 mL/hr at 09/08/21 1412 20 mg at 09/08/21 1412   HYDROmorphone (DILAUDID) injection 2 mg  2 mg Intravenous Q2H PRN [name], MD   2 mg at 09/08/21 1410    VITAL SIGNS: There were no vitals taken for this visit. There were no vitals filed for this visit.   Estimated body mass index is 21.56 kg/m as calculated from the following:   Height as of 08/25/21: 6' (1.829 m).   Weight as of 08/25/21: 159 lb (72.1 kg).   PERFORMANCE STATUS (ECOG) : 1 - Symptomatic but completely ambulatory  IMPRESSION:  I connected with Mr. Reznik and his wife by phone for symptom management follow-up post recent hospitalization. At time of discharged he was doing well with minimal pain or discomfort. Unfortunately over the past 24-48 hours pain has intensified causing distress. He has weaned off of methadone.   Appetite is minimal due to decreased appetite and pain. Wife expresses concerns regarding his poor nutritional intake.   Pain  Mr. Handel pain was much  improved s/p nerve block during recent hospitalization. He was requiring minimal pain medication other than Tylenol for mild aches. Unfortunately pain has escalated over the past 48 hours.   He has been scheduled for consultation for [facility] procedure with IR. The next available appointment is Monday 6/26.   We discussed at length current regimen. We discussed Mr. Malizia pain regimen at length. He is taking toradol which he does gain some relief. He tolerated oxycodone in the past and knows this is available for use as needed. We will make sure he has all medications readily available to prevent any disruption in his pain management. Derk is requesting IVF and pain medication today with hopes of getting him over the hump until further procedures can be scheduled (ablation/Osteocool).   I have spoken with [name] and staff with plans for patient to be seen in the next 24 hours and receive supportive care. Wife and patient aware of plan.   PLAN: Oxycodone 65m every 4 hours as needed for pain Toradol every 6-8 hours as needed  Discussed with Dr. EMarin Olp his staff will give wife a call and schedule office visit and supportive care measures.  Patient is scheduled for consultation on Monday 6/26 regarding OsteoCool procedure.  Continue with aggressive bowel regimen including MiraLAX, lactulose, and senna.   I will plan to support patient and family as needed, in collaboration to other oncology appointments.     Patient expressed understanding and was in agreement with this plan. He also understands that He can call the clinic at any time with any questions, concerns, or complaints.   Thank you for your referral and allowing Palliative to assist in Mr. ABiagio Quintcare.    Problems Addressed: One acute or chronic illness or injury with exacerbation, progression, cancer, and pain.    Amount and/or Complexity of Data: Category 3:Discussion of management or test interpretation with  external physician/other qualified health care professional/appropriate source (not separately reported). Any controlled substances utilized were prescribed in the context of palliative care.    Time Total: 40 min   Visit consisted of counseling and education dealing with the complex and emotionally intense issues of symptom management and palliative care in the setting of serious and potentially life-threatening illness.Greater than 50%  of this time was spent counseling and coordinating care related to the above assessment and plan.  Alda Lea, AGPCNP-BC  Palliative Medicine Team/[place]

## 2021-09-08 NOTE — Patient Instructions (Signed)

## 2021-09-08 NOTE — Progress Notes (Signed)
PT stating his pain has decreased from 10 to 8 but patient requesting more pain medications at this time. Pt aware that his pain medicine is ordered every 2 hours. Pt requesting he get half the dose of his pain medicine now and then more later. Pt educated that as it being a controlled substance that we are unable to split doses. Pt then stating his nausea has not changed. Pt encouraged to try to eat as his nausea may be due to hunger. Pt agreed but stated he will eat at home. [name] aware and order received. Will continue to monitor

## 2021-09-10 ENCOUNTER — Other Ambulatory Visit: Payer: Self-pay | Admitting: Nurse Practitioner

## 2021-09-10 ENCOUNTER — Inpatient Hospital Stay: Payer: Medicare HMO

## 2021-09-10 ENCOUNTER — Encounter: Payer: Self-pay | Admitting: Hematology & Oncology

## 2021-09-10 ENCOUNTER — Inpatient Hospital Stay (HOSPITAL_BASED_OUTPATIENT_CLINIC_OR_DEPARTMENT_OTHER): Payer: Medicare HMO | Admitting: Hematology & Oncology

## 2021-09-10 ENCOUNTER — Other Ambulatory Visit: Payer: Self-pay

## 2021-09-10 VITALS — BP 137/81 | HR 86

## 2021-09-10 VITALS — BP 95/67 | HR 112 | Temp 97.5°F | Resp 17 | Ht 72.0 in

## 2021-09-10 DIAGNOSIS — C7931 Secondary malignant neoplasm of brain: Secondary | ICD-10-CM | POA: Diagnosis not present

## 2021-09-10 DIAGNOSIS — C3492 Malignant neoplasm of unspecified part of left bronchus or lung: Secondary | ICD-10-CM | POA: Diagnosis not present

## 2021-09-10 DIAGNOSIS — C3490 Malignant neoplasm of unspecified part of unspecified bronchus or lung: Secondary | ICD-10-CM | POA: Diagnosis not present

## 2021-09-10 LAB — PREALBUMIN: Prealbumin: 14.3 mg/dL — ABNORMAL LOW (ref 18–38)

## 2021-09-10 LAB — CMP (CANCER CENTER ONLY)
ALT: 51 U/L — ABNORMAL HIGH (ref 0–44)
AST: 78 U/L — ABNORMAL HIGH (ref 15–41)
Albumin: 3.3 g/dL — ABNORMAL LOW (ref 3.5–5.0)
Alkaline Phosphatase: 1066 U/L — ABNORMAL HIGH (ref 38–126)
Anion gap: 11 (ref 5–15)
BUN: 15 mg/dL (ref 6–20)
CO2: 22 mmol/L (ref 22–32)
Calcium: 9 mg/dL (ref 8.9–10.3)
Chloride: 96 mmol/L — ABNORMAL LOW (ref 98–111)
Creatinine: 0.51 mg/dL — ABNORMAL LOW (ref 0.61–1.24)
GFR, Estimated: >60 mL/min (ref >60)
Glucose, Bld: 111 mg/dL — ABNORMAL HIGH (ref 70–99)
Potassium: 4.2 mmol/L (ref 3.5–5.1)
Sodium: 129 mmol/L — ABNORMAL LOW (ref 135–145)
Total Bilirubin: 1.3 mg/dL — ABNORMAL HIGH (ref 0.3–1.2)
Total Protein: 6.3 g/dL — ABNORMAL LOW (ref 6.5–8.1)

## 2021-09-10 LAB — CBC WITH DIFFERENTIAL (CANCER CENTER ONLY)
Abs Immature Granulocytes: 0.16 10*3/uL — ABNORMAL HIGH (ref 0.00–0.07)
Basophils Absolute: 0 10*3/uL (ref 0.0–0.1)
Basophils Relative: 0 %
Eosinophils Absolute: 0 10*3/uL (ref 0.0–0.5)
Eosinophils Relative: 0 %
HCT: 35.3 % — ABNORMAL LOW (ref 39.0–52.0)
Hemoglobin: 11.6 g/dL — ABNORMAL LOW (ref 13.0–17.0)
Immature Granulocytes: 1 %
Lymphocytes Relative: 8 %
Lymphs Abs: 1.2 10*3/uL (ref 0.7–4.0)
MCH: 30.4 pg (ref 26.0–34.0)
MCHC: 32.9 g/dL (ref 30.0–36.0)
MCV: 92.4 fL (ref 80.0–100.0)
Monocytes Absolute: 1.1 10*3/uL — ABNORMAL HIGH (ref 0.1–1.0)
Monocytes Relative: 7 %
Neutro Abs: 13.5 10*3/uL — ABNORMAL HIGH (ref 1.7–7.7)
Neutrophils Relative %: 84 %
Platelet Count: 553 10*3/uL — ABNORMAL HIGH (ref 150–400)
RBC: 3.82 MIL/uL — ABNORMAL LOW (ref 4.22–5.81)
RDW: 16 % — ABNORMAL HIGH (ref 11.5–15.5)
WBC Count: 16 10*3/uL — ABNORMAL HIGH (ref 4.0–10.5)
nRBC: 0 % (ref 0.0–0.2)

## 2021-09-10 LAB — LACTATE DEHYDROGENASE: LDH: 1333 U/L — ABNORMAL HIGH (ref 98–192)

## 2021-09-10 MED ORDER — ONDANSETRON HCL 4 MG/2ML IJ SOLN
8.0000 mg | Freq: Once | INTRAMUSCULAR | Status: AC
  Administered 2021-09-10: 8 mg via INTRAVENOUS
  Filled 2021-09-10: qty 4

## 2021-09-10 MED ORDER — SODIUM CHLORIDE 0.9 % IV SOLN
20.0000 mg | Freq: Once | INTRAVENOUS | Status: AC
  Administered 2021-09-10: 20 mg via INTRAVENOUS
  Filled 2021-09-10: qty 20

## 2021-09-10 MED ORDER — HYDROMORPHONE HCL 1 MG/ML IJ SOLN
2.0000 mg | INTRAMUSCULAR | Status: DC | PRN
  Administered 2021-09-10 (×3): 2 mg via INTRAVENOUS
  Filled 2021-09-10 (×3): qty 2

## 2021-09-10 MED ORDER — SODIUM CHLORIDE 0.9 % IV SOLN: 8.0000 mg | Freq: Once | INTRAVENOUS | Status: DC

## 2021-09-10 MED ORDER — LORAZEPAM 2 MG/ML IJ SOLN
0.5000 mg | Freq: Once | INTRAMUSCULAR | Status: AC
  Administered 2021-09-10: 0.5 mg via INTRAVENOUS
  Filled 2021-09-10: qty 1

## 2021-09-10 MED ORDER — SODIUM CHLORIDE 0.9 % IV SOLN: INTRAVENOUS | Status: AC

## 2021-09-10 MED ORDER — ZOLEDRONIC ACID 4 MG/100ML IV SOLN
4.0000 mg | Freq: Once | INTRAVENOUS | Status: AC
  Administered 2021-09-10: 4 mg via INTRAVENOUS
  Filled 2021-09-10: qty 100

## 2021-09-10 MED ORDER — KETOROLAC TROMETHAMINE 15 MG/ML IJ SOLN
30.0000 mg | Freq: Once | INTRAMUSCULAR | Status: AC
  Administered 2021-09-10: 30 mg via INTRAVENOUS
  Filled 2021-09-10: qty 2

## 2021-09-10 NOTE — Progress Notes (Signed)
Hematology and Oncology Follow Up Visit  Bill Garza [id number] 1960/08/13 61 y.o. 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  Unfortunately, since we last saw him in the office, he has had a lot of issues.  He was hospitalized because of severe back pain.  He underwent a nerve block.  This was at the right L4-L5 level.  This did seem to work.  He had relief.  He was able to go home.  He was off pain medication.  However, he now has a pain back.  Just came back all of a sudden.  He is miserable.  I think he has an appointment with Interventional Radiology on Monday for the evaluation of a kyphoplasty to see if this may help.  I know that we have talked about doing RFA of the right L4-5 nerve root.  This seems to be where his pain is coming from.  He also now are dealing with metastasis to the liver.  This is also a huge problem for [place].  His good be hard to treat this if we cannot get him in better shape.  Currently, I would say his performance status is probably ECOG 2 at best.  Because of the pain, he really is not eating all that well.  He is unable to get around a lot.  He is not having any problems with diarrhea.  There is no urinary issues.  He has had no cough or shortness of breath.  I just really hate the fact that he is miserable.  I told him to start the Marinol again.  He stopped taking this.  Maybe, this might give him a little bit of relief.  This may also help his appetite.  Currently, I would say that his performance status is no better than ECOG 2.     Medications:  Current Outpatient Medications:    albuterol (PROVENTIL) (2.5 MG/3ML) 0.083% nebulizer solution, Take 2.5 mg by nebulization daily as needed for shortness of  breath or wheezing., Disp: , Rfl:    albuterol (VENTOLIN HFA) 108 (90 Base) MCG/ACT inhaler, Inhale 2 puffs into the lungs every 4 (four) hours as needed for wheezing or shortness of breath. (Patient taking differently: Inhale 2 puffs into the lungs 2 (two) times daily as needed for wheezing or shortness of breath.), Disp: 3 each, Rfl: 1   ketorolac (TORADOL) 10 MG tablet, Take 1 tablet (10 mg total) by mouth every 6 (six) hours as needed for moderate pain., Disp: 60 tablet, Rfl: 0   LORazepam (ATIVAN) 1 MG tablet, Take 1 tablet (1 mg total) by mouth every 8 (eight) hours as needed for anxiety., Disp: 5 tablet, Rfl: 0   Oxycodone HCl 10 MG TABS, Take 1 tablet (10 mg total) by mouth every 6 (six) hours as needed., Disp: 90 tablet, Rfl: 0   Rivaroxaban (XARELTO) 15 MG TABS tablet, Take 1 tablet (15 mg total) by mouth 2 (two) times daily with a meal for 17 days., Disp: 34 tablet, Rfl: 0   aspirin EC 81 MG tablet, Take 81 mg by mouth daily. (Patient not taking: Reported on 09/10/2021), Disp: , Rfl:    Blood Glucose Monitoring Suppl (ACCU-CHEK GUIDE) w/Device KIT, USE AS DIRECTED TO CHECK BLOOD SUGAR (Patient not  taking: Reported on 07/26/2021), Disp: , Rfl:    Blood Glucose Monitoring Suppl (GLUCOCOM BLOOD GLUCOSE MONITOR) DEVI, , Disp: , Rfl:    budesonide-formoterol (SYMBICORT) 160-4.5 MCG/ACT inhaler, Inhale 2 puffs into the lungs 2 (two) times daily. (Patient not taking: Reported on 09/10/2021), Disp: 3 each, Rfl: 1   cyclobenzaprine (FLEXERIL) 5 MG tablet, Take 1 tablet (5 mg total) by mouth 3 (three) times daily as needed for muscle spasms. (Patient not taking: Reported on 09/10/2021), Disp: 30 tablet, Rfl: 0   dronabinol (MARINOL) 5 MG capsule, Take 1 capsule (5 mg total) by mouth 2 (two) times daily before lunch and supper. (Patient not taking: Reported on 09/10/2021), Disp: 30 capsule, Rfl: 0   escitalopram (LEXAPRO) 5 MG tablet, Take 5 mg by mouth daily. (Patient not taking: Reported on 09/10/2021), Disp:  , Rfl:    fluocinolone (VANOS) 0.01 % cream, Apply 1 application. topically daily as needed (rash). (Patient not taking: Reported on 09/10/2021), Disp: , Rfl:    lactulose (CHRONULAC) 10 GM/15ML solution, Take 20-30 mLs by mouth 2 (two) times daily. (Patient not taking: Reported on 09/10/2021), Disp: , Rfl:    metFORMIN (GLUCOPHAGE) 500 MG tablet, Take 500 mg by mouth daily. (Patient not taking: Reported on 09/10/2021), Disp: , Rfl:    montelukast (SINGULAIR) 10 MG tablet, Take 10 mg by mouth at bedtime. (Patient not taking: Reported on 09/10/2021), Disp: , Rfl:    Multiple Vitamin (MULTIVITAMIN) capsule, Take 1 capsule by mouth daily. (Patient not taking: Reported on 09/10/2021), Disp: , Rfl:    naloxone (NARCAN) nasal spray 4 mg/0.1 mL, Place into the nose. (Patient not taking: Reported on 09/10/2021), Disp: , Rfl:    ondansetron (ZOFRAN) 8 MG tablet, Take 1 tablet (8 mg total) by mouth every 8 (eight) hours as needed for nausea or vomiting., Disp: 20 tablet, Rfl: 0   pantoprazole (PROTONIX) 40 MG tablet, Take 40 mg by mouth daily. (Patient not taking: Reported on 09/10/2021), Disp: , Rfl:    polyethylene glycol (MIRALAX / GLYCOLAX) 17 g packet, Take 17 g by mouth daily. (Patient not taking: Reported on 08/13/2021), Disp: , Rfl:    [START ON 09/21/2021] rivaroxaban (XARELTO) 20 MG TABS tablet, Take 1 tablet (20 mg total) by mouth daily with supper. (Patient not taking: Reported on 09/10/2021), Disp: 30 tablet, Rfl: 3   senna-docusate (SENOKOT-S) 8.6-50 MG tablet, Take 2 tablets by mouth 2 (two) times daily. (Patient not taking: Reported on 09/10/2021), Disp: 90 tablet, Rfl: 2 No current facility-administered medications for this visit.  Facility-Administered Medications Ordered in Other Visits:    HYDROmorphone (DILAUDID) injection 2 mg, 2 mg, Intravenous, Q2H PRN, Josph Macho, MD, 2 mg at 09/10/21 1218  Allergies:  Allergies  Allergen Reactions   Gabapentin Swelling    Leg swelling     Levetiracetam Other (See Comments)    Personality changes   Pregabalin Swelling    Leg swelling    Bupropion Nausea And Vomiting   Duloxetine Other (See Comments)    Excessive sleeping   Ezetimibe Other (See Comments)    Unknown reaction   Other Other (See Comments)    Antibiotics - per wife at bedside, patient will develop C-diff if given "any antibiotic"   Prochlorperazine Nausea And Vomiting   Lamotrigine Itching and Rash    Past Medical History, Surgical history, Social history, and Family History were reviewed and updated.  Review of Systems: Review of Systems  Constitutional: Negative.   HENT:  Negative.  Eyes: Negative.   Respiratory: Negative.    Cardiovascular: Negative.   Gastrointestinal: Negative.   Endocrine: Negative.   Genitourinary: Negative.    Musculoskeletal: Negative.   Skin: Negative.   Neurological: Negative.   Hematological: Negative.   Psychiatric/Behavioral:  The patient is nervous/anxious.     Physical Exam:  height is 6' (1.829 m). His oral temperature is 97.5 F (36.4 C) (abnormal). His blood pressure is 95/67 and his pulse is 112 (abnormal). His respiration is 17 and oxygen saturation is 98%.   Wt Readings from Last 3 Encounters:  08/25/21 159 lb (72.1 kg)  08/01/21 159 lb (72.1 kg)  07/30/21 169 lb (76.7 kg)    Physical Exam Vitals reviewed.  HENT:     Head: Normocephalic and atraumatic.  Eyes:     Pupils: Pupils are equal, round, and reactive to light.  Cardiovascular:     Rate and Rhythm: Normal rate and regular rhythm.     Heart sounds: Normal heart sounds.  Pulmonary:     Effort: Pulmonary effort is normal.     Breath sounds: Normal breath sounds.  Abdominal:     General: Bowel sounds are normal.     Palpations: Abdomen is soft.  Musculoskeletal:        General: No tenderness or deformity. Normal range of motion.     Cervical back: Normal range of motion.  Lymphadenopathy:     Cervical: No cervical adenopathy.   Skin:    General: Skin is warm and dry.     Findings: No erythema or rash.     Comments: On his skin exam, there are very scattered small erythematous maculopapular lesions.  These are maybe a millimeter in size.  They are pruritic.  There might be a central eschar.  There is no pustules.  Neurological:     Mental Status: He is alert and oriented to person, place, and time.  Psychiatric:        Behavior: Behavior normal.        Thought Content: Thought content normal.        Judgment: Judgment normal.      Lab Results  Component Value Date   WBC 16.0 (H) 09/10/2021   HGB 11.6 (L) 09/10/2021   HCT 35.3 (L) 09/10/2021   MCV 92.4 09/10/2021   PLT 553 (H) 09/10/2021     Chemistry      Component Value Date/Time   NA 129 (L) 09/10/2021 0803   K 4.2 09/10/2021 0803   CL 96 (L) 09/10/2021 0803   CO2 22 09/10/2021 0803   BUN 15 09/10/2021 0803   CREATININE 0.51 (L) 09/10/2021 0803      Component Value Date/Time   CALCIUM 9.0 09/10/2021 0803   ALKPHOS 1,066 (H) 09/10/2021 0803   AST 78 (H) 09/10/2021 0803   ALT 51 (H) 09/10/2021 0803   BILITOT 1.3 (H) 09/10/2021 0803      Impression and Plan: Bill Garza is a very nice 61 year old white male.  He has a history of metastatic adenocarcinoma of the lung.  He initially presented with localized-stage IIB -adenocarcinoma of the left lung.  He was placed on clinical trial.  He only had 1 cycle of treatment.  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  He did undergo resection of a lesion back in August 2022.  He then underwent radiosurgery.  He has had another round of radiosurgery.    He recently has had resection of the CNS met.  He is still followed by the back pain.  This is really his dictator of quality of life.  I know he has a liver metastasis.  His prealbumin level is actually not all that bad at 14.2.  As such, we can certainly try to treat the liver metastasis.  Again we really have to try to get him  more comfortable if possible.  Again it may not be possible to do this.  I know that a lot of people have tried to help.  I know he is doing his best.  He has great support from his family.  They are doing everything possible.  I think that he is post to see Dr. Franky Macho of Neurosurgery on Monday.  Maybe, Dr. Franky Macho might be able to do the RFA.  I know that Interventional Radiology does not do RFA for nerve root irritation.  We will have to plan to get him back to see [place].  I suspect that he probably will come back in a couple weeks, if not sooner.    Josph Macho, MD 6/23/20235:08 PM

## 2021-09-10 NOTE — Progress Notes (Signed)
1051 - patient requesting additional pain medicine. Pt aware Dilaudid can only be administered as ordered q2h. He would not be due until 1208 & his IVF will be completed before then. Pt states "they always give me a dose before I leave." Pt educated on how medication is ordered and inability to give additional dose because he will be leaving prior to 1208.  1055 - pt asleep.  1103 - pt requests IVF be slowed so he can stay until 1208 for additional dose of Dilaudid. OK per Dr Myna Hidalgo.  1202 - pt requests nausea medication. Orders entered for Ativan 0.5mg  per Dr Myna Hidalgo.

## 2021-09-13 ENCOUNTER — Other Ambulatory Visit: Payer: Self-pay | Admitting: Hematology & Oncology

## 2021-09-13 ENCOUNTER — Ambulatory Visit
Admission: RE | Admit: 2021-09-13 | Discharge: 2021-09-13 | Disposition: A | Discharge status: Home / Self Care | Payer: Medicare HMO | Source: Ambulatory Visit | Attending: Nurse Practitioner | Admitting: Nurse Practitioner

## 2021-09-13 ENCOUNTER — Encounter: Payer: Self-pay | Admitting: *Deleted

## 2021-09-13 DIAGNOSIS — M5440 Lumbago with sciatica, unspecified side: Secondary | ICD-10-CM

## 2021-09-13 DIAGNOSIS — C7951 Secondary malignant neoplasm of bone: Secondary | ICD-10-CM

## 2021-09-13 DIAGNOSIS — C3490 Malignant neoplasm of unspecified part of unspecified bronchus or lung: Secondary | ICD-10-CM

## 2021-09-13 DIAGNOSIS — G8929 Other chronic pain: Secondary | ICD-10-CM

## 2021-09-13 HISTORY — PX: IR RADIOLOGIST EVAL & MGMT: IMG5224

## 2021-09-13 NOTE — Progress Notes (Signed)
Chief Complaint: Patient was seen in consultation today for right L4 radiculopathy and metastatic lung cancer with cancer associated pain at the request of Pickenpack-Cousar,Athena N  Referring Physician(s): Pickenpack-Cousar,Athena N  History of Present Illness: Bill Garza is a 61 y.o. male with a history of metastatic adenocarcinoma of the lung.  He has multifocal osseous metastatic disease including a expansile lesion arising from the inner cortex of the right iliac crest.  This results in significant expansion of the periosteum.  None of his vertebral body lesions demonstrates pathologic fracture or significant expansion that may be putting tension on the periosteum.  In addition to his osseous metastatic disease, he also has multilevel degenerative spondylosis including a right extraforaminal disc protrusion which is contacting the exiting right L4 nerve root.  This seems to be the source of his severe right L4 radiculopathy.  On 08/30/2021 I performed a right L4 nerve root block for him as an inpatient at [facility].  Fortunately, this resulted in rapid relief of symptoms and he was able to be discharged from the hospital.  For a period of 2-3 days he had significant symptomatic relief and was taking significantly less pain medications.  Unfortunately, his symptoms returned quickly and he is again miserable.  He reports that the pain begins in the middle of his back and radiates down his right leg toward the front and lateral aspect of his knee.  Additionally, he has pain in his right pelvis and, generally, pain everywhere.  He is taking Tylenol and Toradol as directed every 8 hours.  He alternates these so he is taking one of the medications every 4 hours.  He tried methadone but this made him feel too ill.  He is also taking p.o. Dilaudid but his wife states that this does not even touch the pain.  He has tried multiple additional pain medications, but nothing seems to offer  him significant relief.  He is unable to sleep or eat due to the chronic constant pain.  He is truly miserable.  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 Authorizing Provider  albuterol (PROVENTIL) (2.5 MG/3ML) 0.083% nebulizer solution Take 2.5 mg by nebulization daily as needed for shortness of breath or wheezing. 01/25/21   [provider]  albuterol (VENTOLIN HFA) 108 (90 Base) MCG/ACT inhaler Inhale 2 puffs into the lungs every 4 (four) hours as needed for wheezing or shortness of breath. Patient taking differently: Inhale 2 puffs into the lungs 2 (two) times daily as needed for wheezing or shortness of breath. 07/14/21   Pickenpack-Cousar, Arty Baumgartner, NP  aspirin EC 81 MG tablet Take  81 mg by mouth daily. Patient not taking: Reported on 09/10/2021    [provider]  Blood Glucose Monitoring Suppl (ACCU-CHEK GUIDE) w/Device KIT USE AS DIRECTED TO CHECK BLOOD SUGAR Patient not taking: Reported on 07/26/2021 11/20/20   [provider]  Blood Glucose Monitoring Suppl (GLUCOCOM BLOOD GLUCOSE MONITOR) DEVI  11/20/20   [provider]   budesonide-formoterol (SYMBICORT) 160-4.5 MCG/ACT inhaler Inhale 2 puffs into the lungs 2 (two) times daily. Patient not taking: Reported on 09/10/2021 07/14/21   Pickenpack-Cousar, Arty Baumgartner, NP  cyclobenzaprine (FLEXERIL) 5 MG tablet Take 1 tablet (5 mg total) by mouth 3 (three) times daily as needed for muscle spasms. Patient not taking: Reported on 09/10/2021 09/03/21   Regalado, Jon Billings A, MD  dronabinol (MARINOL) 5 MG capsule Take 1 capsule (5 mg total) by mouth 2 (two) times daily before lunch and supper. Patient not taking: Reported on 09/10/2021 09/03/21   Regalado, Jon Billings A, MD  escitalopram (LEXAPRO) 5 MG tablet Take 5 mg by mouth daily. Patient not taking: Reported on 09/10/2021    [provider]  fluocinolone (VANOS) 0.01 % cream Apply 1 application. topically daily as needed (rash). Patient not taking: Reported on 09/10/2021 03/18/20   [provider]  ketorolac (TORADOL) 10 MG tablet Take 1 tablet (10 mg total) by mouth every 6 (six) hours as needed for moderate pain. 09/08/21   Pickenpack-Cousar, Arty Baumgartner, NP  lactulose (CHRONULAC) 10 GM/15ML solution Take 20-30 mLs by mouth 2 (two) times daily. Patient not taking: Reported on 09/10/2021    [provider]  LORazepam (ATIVAN) 1 MG tablet Take 1 tablet (1 mg total) by mouth every 8 (eight) hours as needed for anxiety. 09/03/21   Regalado, Belkys A, MD  metFORMIN (GLUCOPHAGE) 500 MG tablet Take 500 mg by mouth daily. Patient not taking: Reported on 09/10/2021    [provider]  montelukast (SINGULAIR) 10 MG tablet Take 10 mg by mouth at bedtime. Patient not taking: Reported on 09/10/2021    [provider]  Multiple Vitamin (MULTIVITAMIN) capsule Take 1 capsule by mouth daily. Patient not taking: Reported on 09/10/2021    [provider]  naloxone [facility]) nasal spray 4 mg/0.1 mL Place into the nose. Patient not taking: Reported on 09/10/2021 12/05/20   [provider]  ondansetron  (ZOFRAN) 8 MG tablet Take 1 tablet (8 mg total) by mouth every 8 (eight) hours as needed for nausea or vomiting. 09/07/21   Pickenpack-Cousar, Arty Baumgartner, NP  Oxycodone HCl 10 MG TABS Take 1 tablet (10 mg total) by mouth every 6 (six) hours as needed. 09/08/21   Pickenpack-Cousar, Arty Baumgartner, NP  pantoprazole (PROTONIX) 40 MG tablet Take 40 mg by mouth daily. Patient not taking: Reported on 09/10/2021    [provider]  polyethylene glycol (MIRALAX / GLYCOLAX) 17 g packet Take 17 g by mouth daily. Patient not taking: Reported on 08/13/2021    [provider]  Rivaroxaban (XARELTO) 15 MG TABS tablet Take 1 tablet (15 mg total) by mouth 2 (two) times daily with a meal for 17 days. 09/03/21 09/20/21  Regalado, Jon Billings A, MD  rivaroxaban (XARELTO) 20 MG TABS tablet Take 1 tablet (20 mg total) by mouth daily with supper. Patient not taking: Reported on 09/10/2021 09/21/21   Regalado, Jon Billings A, MD  senna-docusate (SENOKOT-S) 8.6-50 MG tablet Take 2 tablets by mouth 2 (two) times daily. Patient not taking: Reported on 09/10/2021 07/26/21   Josph Macho, MD  No family history on file.  Social History   Socioeconomic History   Marital status: Married    Spouse name: Not on file   Number of children: Not on file   Years of education: Not on file   Highest education level: Not on file  Occupational History   Not on file  Tobacco Use   Smoking status: Former    Packs/day: 1.00    Years: 40.00    Total pack years: 40.00    Types: Cigarettes    Quit date: 07/20/2020    Years since quitting: 1.1   Smokeless tobacco: Never  Vaping Use   Vaping Use: Never used  Substance and Sexual Activity   Alcohol use: Not Currently   Drug use: Not Currently   Sexual activity: Not on file  Other Topics Concern   Not on file  Social History Narrative   Not on file   Social Determinants of Health   Financial Resource Strain: Not on file  Food Insecurity: Not on file  Transportation Needs: Not  on file  Physical Activity: Not on file  Stress: Not on file  Social Connections: Not on file    ECOG Status: 3 - Symptomatic, >50% confined to bed  Review of Systems: A 12 point ROS discussed and pertinent positives are indicated in the HPI above.  All other systems are negative.  Review of Systems  Vital Signs: BP 109/74 (BP Location: Left Arm)   Pulse (!) 112   SpO2 98%     Physical Exam Constitutional:      Appearance: He is not ill-appearing.  HENT:     Head: Normocephalic and atraumatic.  Eyes:     General: No scleral icterus. Cardiovascular:     Rate and Rhythm: Normal rate.  Pulmonary:     Effort: Pulmonary effort is normal.  Abdominal:     General: Abdomen is flat.     Palpations: Abdomen is soft.  Musculoskeletal:       Legs:     Comments: TTP along the right iliac crest  Skin:    General: Skin is warm and dry.  Neurological:     Mental Status: He is alert and oriented to person, place, and time.       Imaging: DG Chest 2 View  Result Date: 09/02/2021 CLINICAL DATA:  Hypoxemia. EXAM: CHEST - 2 VIEW COMPARISON:  August 29, 2021. FINDINGS: The heart size and mediastinal contours are within normal limits. Both lungs are clear. The visualized skeletal structures are unremarkable. IMPRESSION: No active cardiopulmonary disease. Electronically Signed   By: Lupita Raider M.D.   On: 09/02/2021 14:26   IR Fluoro Guide Ndl Plmt / BX  Result Date: 08/30/2021 CLINICAL DATA:  61 year old male with L4-L5 extraforaminal disc extrusion contacting the right L4 nerve root and associated with right L4 radiculopathy. In-patient nerve root block for symptom relief. EXAM: IR INJECT/THERA/INC NEEDLE/CATH/PLC EPI/LUMB/SAC W/IMG FLUOROSCOPY TIME:  Radiation exposure index: 14 mGy PROCEDURE: The procedure, risks, benefits, and alternatives were explained to the patient. Questions regarding the procedure were encouraged and answered. The patient understands and consents to the  procedure. RIGHT L4 NERVE ROOT BLOCK AND TRANSFORAMINAL EPIDURAL: A posterior oblique approach was taken to the intervertebral foramen on the right at L4 using a curved 22 gauge spinal needle. Injection of Omnipaque 180 outlined the L4 nerve root and showed good epidural spread. No vascular opacification is seen. 80 mg of Depo-Medrol mixed with 2 mL 1% lidocaine were instilled. The  procedure was well-tolerated, and the patient was discharged thirty minutes following the injection in good condition. COMPLICATIONS: None IMPRESSION: Technically successful injection consisting of a right L4 nerve root block and transforaminal epidural. Electronically Signed   By: Malachy Moan M.D.   On: 08/30/2021 14:47   DG CHEST PORT 1 VIEW  Result Date: 08/29/2021 CLINICAL DATA:  Hypoxia. EXAM: PORTABLE CHEST 1 VIEW COMPARISON:  June 28, 2021 FINDINGS: The heart size and mediastinal contours are within normal limits. Mild areas of atelectasis and/or infiltrate are seen within the bilateral lung bases. There is no evidence of a pleural effusion or pneumothorax. The visualized skeletal structures are unremarkable. IMPRESSION: Mild bibasilar atelectasis and/or infiltrate. Electronically Signed   By: Aram Candela M.D.   On: 08/29/2021 17:08   MR LIVER W WO CONTRAST  Result Date: 08/27/2021 CLINICAL DATA:  Inpatient. Metastatic non-small cell lung cancer. Indeterminate liver lesions on recent sonogram. Progression of metastatic disease in the liver, lymph nodes and bones on CT from 1 day prior. EXAM: MRI ABDOMEN WITHOUT AND WITH CONTRAST TECHNIQUE: Multiplanar multisequence MR imaging of the abdomen was performed both before and after the administration of intravenous contrast. CONTRAST:  7mL GADAVIST GADOBUTROL 1 MMOL/ML IV SOLN COMPARISON:  06/22/2021 PET-CT. 08/24/2021 abdominal sonogram. 08/25/2021 CT abdomen/pelvis. FINDINGS: Lower chest: Solid 0.5 cm medial dependent right lung base pulmonary nodule (series 8/image  5) as better seen on recent CT. Hepatobiliary: Mild hepatomegaly. Background diffuse hepatic hemosiderosis. Innumerable (> 30) enhancing solid liver masses scattered throughout the liver, confluent in the right liver, with restricted diffusion, compatible with metastatic disease, generally new since 06/18/2021 CT. Representative 2.0 x 1.8 cm caudate lobe mass (series 5/image 9). Representative segment 7 right liver 2.9 x 2.1 cm mass (series 5/image 9). Representative 1.5 x 1.3 cm segment 4B left liver mass (series 5/image 16). Small amount of layering sludge in gallbladder. Mild diffuse gallbladder wall thickening, asymmetrically prominent adjacent to the liver. No cholelithiasis. No pericholecystic fluid. Mild-to-moderate intrahepatic biliary ductal dilatation throughout the right liver with relative sparing of the left liver, with central extrinsic biliary compression by the confluent central right liver metastases. Top normal caliber common bile duct (6 mm diameter) with smooth distal tapering. No choledocholithiasis. Pancreas: No pancreatic mass or duct dilation.  No pancreas divisum. Spleen: Normal size spleen. Diffuse splenic hemosiderosis. A few scattered tiny T2 hyperintense subcentimeter splenic lesions are unchanged from 06/18/2021 CT, considered benign. Adrenals/Urinary Tract: Normal adrenals. No hydronephrosis. Normal kidneys with no renal mass. Stomach/Bowel: Normal non-distended stomach. Visualized small and large bowel is normal caliber, with no bowel wall thickening. Vascular/Lymphatic: Normal caliber abdominal aorta. Patent renal, splenic and hepatic veins. Extrinsic narrowing of the right portal vein tree by the confluent right liver metastatic disease. Portal veins appear grossly patent, with evaluation limited by motion degradation. Enlarged 1.7 cm short axis diameter portacaval node (series 8/image 16) and a few mildly enlarged aortocaval nodes, largest 1.0 cm (series 8/image 24), all newly  enlarged since 06/18/2021 CT. Other: No abdominal ascites or focal fluid collection. Musculoskeletal: Several enhancing osseous lesions throughout the visualized axial skeleton, for example 2.4 cm in the T11 vertebral body (series 3/image 23), not clearly seen on 06/18/2021 CT, and expansile 4.3 cm right iliac crest lesion (series 3/image 14), increased from 0.8 cm on 06/18/2021 CT. IMPRESSION: 1. Innumerable (> 30) enhancing liver masses scattered throughout the liver, confluent in the right liver, compatible with metastatic disease, generally new since 06/18/2021 CT. 2. Several enhancing osseous lesions throughout the visualized  axial skeleton, increased from 06/18/2021 CT, compatible with progressive multifocal osseous metastatic disease. 3. Mild-to-moderate intrahepatic biliary ductal dilatation throughout the right liver and extrinsic narrowing of the right portal vein tree by the confluent central right liver metastatic disease. Top normal caliber common bile duct (6 mm diameter) with smooth distal tapering. No choledocholithiasis. 4. Small amount of gallbladder sludge. Nonspecific mild gallbladder wall thickening, potentially reactive or due to noninflammatory edema. No cholelithiasis. 5. 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 Electronically Signed   By: Delbert Phenix M.D.   On: 08/27/2021 08:22   MR Lumbar Spine W Wo Contrast  Result Date: 08/25/2021 CLINICAL DATA:  Worsening back and right leg pain. History of metastatic lung cancer. EXAM: MRI THORACIC AND LUMBAR SPINE WITHOUT AND WITH CONTRAST TECHNIQUE: Multiplanar and multiecho pulse sequences of the thoracic and lumbar spine were obtained without and with intravenous contrast. CONTRAST:  7mL GADAVIST GADOBUTROL 1 MMOL/ML IV SOLN COMPARISON:  CT lumbar myelogram dated August 20, 2021. MRI thoracic and lumbar spine dated Jul 30, 2021. FINDINGS: MRI THORACIC SPINE FINDINGS Alignment:  Physiologic. Vertebrae: T3, T6, T8, and T11 metastases have increased in  size. New tiny lesions at T10 and T12 (series 17, images 5 and 7). No epidural tumor extension. No fracture or evidence of discitis. Cord: Normal signal and morphology. No abnormal intrathecal enhancement. Paraspinal and other soft tissues: Negative. Disc levels: Unchanged small right paracentral disc protrusions at T1-T2 and T2-T3. No spinal canal or neuroforaminal stenosis at any level. MRI LUMBAR SPINE FINDINGS Segmentation:  Standard. Alignment:  Unchanged mild retrolisthesis at L3-L4. Vertebrae: L4 and S2 vertebral body metastases have increased in size. New L1, L2, L3, S1, and S3 vertebral body metastases. No epidural tumor extension. No fracture or evidence of discitis. Conus medullaris: Extends to the L1-L2 level and appears normal. No abnormal intrathecal enhancement. Paraspinal and other soft tissues: Enlarging metastases in the bilateral posterior iliac bones. Enlarging upper abdominal lymphadenopathy. Disc levels: T12-L1:  Negative. L1-L2:  Negative. L2-L3: Negative disc. Unchanged mild bilateral facet arthropathy. No stenosis. L3-L4: Unchanged mild disc bulging with superimposed right subarticular disc protrusion. Unchanged mild bilateral facet arthropathy. Unchanged mild spinal canal stenosis. Unchanged moderate right and mild left lateral recess stenosis. Unchanged mild bilateral neuroforaminal stenosis. L4-L5: Prior left hemilaminectomy. Unchanged mild disc bulging with superimposed right extraforaminal disc protrusion contacting the exiting right L4 nerve root. Unchanged mild right lateral recess and bilateral neuroforaminal stenosis. No spinal canal stenosis. L5-S1: Unchanged small left paracentral disc protrusion and mild bilateral facet arthropathy. No stenosis. IMPRESSION: 1. New and progressive thoracic and lumbosacral osseous metastatic disease without significant extraosseous extension or pathologic fracture. 2. Enlarging bilateral iliac bone and upper abdominal nodal metastatic disease. 3.  Unchanged multilevel lumbar spondylosis as described above. Right extraforaminal disc protrusion contacts and potentially irritates the exiting right L4 nerve root. Electronically Signed   By: Obie Dredge M.D.   On: 08/25/2021 14:55   MR THORACIC SPINE W WO CONTRAST  Result Date: 08/25/2021 CLINICAL DATA:  Worsening back and right leg pain. History of metastatic lung cancer. EXAM: MRI THORACIC AND LUMBAR SPINE WITHOUT AND WITH CONTRAST TECHNIQUE: Multiplanar and multiecho pulse sequences of the thoracic and lumbar spine were obtained without and with intravenous contrast. CONTRAST:  7mL GADAVIST GADOBUTROL 1 MMOL/ML IV SOLN COMPARISON:  CT lumbar myelogram dated August 20, 2021. MRI thoracic and lumbar spine dated Jul 30, 2021. FINDINGS: MRI THORACIC SPINE FINDINGS Alignment:  Physiologic. Vertebrae: T3, T6, T8, and T11 metastases have increased in  size. New tiny lesions at T10 and T12 (series 17, images 5 and 7). No epidural tumor extension. No fracture or evidence of discitis. Cord: Normal signal and morphology. No abnormal intrathecal enhancement. Paraspinal and other soft tissues: Negative. Disc levels: Unchanged small right paracentral disc protrusions at T1-T2 and T2-T3. No spinal canal or neuroforaminal stenosis at any level. MRI LUMBAR SPINE FINDINGS Segmentation:  Standard. Alignment:  Unchanged mild retrolisthesis at L3-L4. Vertebrae: L4 and S2 vertebral body metastases have increased in size. New L1, L2, L3, S1, and S3 vertebral body metastases. No epidural tumor extension. No fracture or evidence of discitis. Conus medullaris: Extends to the L1-L2 level and appears normal. No abnormal intrathecal enhancement. Paraspinal and other soft tissues: Enlarging metastases in the bilateral posterior iliac bones. Enlarging upper abdominal lymphadenopathy. Disc levels: T12-L1:  Negative. L1-L2:  Negative. L2-L3: Negative disc. Unchanged mild bilateral facet arthropathy. No stenosis. L3-L4: Unchanged mild disc  bulging with superimposed right subarticular disc protrusion. Unchanged mild bilateral facet arthropathy. Unchanged mild spinal canal stenosis. Unchanged moderate right and mild left lateral recess stenosis. Unchanged mild bilateral neuroforaminal stenosis. L4-L5: Prior left hemilaminectomy. Unchanged mild disc bulging with superimposed right extraforaminal disc protrusion contacting the exiting right L4 nerve root. Unchanged mild right lateral recess and bilateral neuroforaminal stenosis. No spinal canal stenosis. L5-S1: Unchanged small left paracentral disc protrusion and mild bilateral facet arthropathy. No stenosis. IMPRESSION: 1. New and progressive thoracic and lumbosacral osseous metastatic disease without significant extraosseous extension or pathologic fracture. 2. Enlarging bilateral iliac bone and upper abdominal nodal metastatic disease. 3. Unchanged multilevel lumbar spondylosis as described above. Right extraforaminal disc protrusion contacts and potentially irritates the exiting right L4 nerve root. Electronically Signed   By: Obie Dredge M.D.   On: 08/25/2021 14:55   CT ABDOMEN PELVIS W CONTRAST  Result Date: 08/25/2021 CLINICAL DATA:  61 year old with acute abdominal pain. History of metastatic lung cancer. EXAM: CT ABDOMEN AND PELVIS WITH CONTRAST TECHNIQUE: Multidetector CT imaging of the abdomen and pelvis was performed using the standard protocol following bolus administration of intravenous contrast. RADIATION DOSE REDUCTION: This exam was performed according to the departmental dose-optimization program which includes automated exposure control, adjustment of the mA and/or kV according to patient size and/or use of iterative reconstruction technique. CONTRAST:  OMNIPAQUE IOHEXOL 300 MG/ML  SOLN COMPARISON:  CTA chest, abdomen and pelvis 05/27/2021 and CT of the chest, abdomen and pelvis on 06/18/2021 and PET/CT 06/22/2021. Thoracic and lumbar MRI 07/30/2021. Abdominal ultrasound  08/24/2021. FINDINGS: Lower chest: Again noted are small pulmonary nodules in the right lower lobe. There is an dominant nodule in the right lower lobe that measures approximately 6 mm minimally changed since 05/27/2021. However, there is a new punctate nodule in the medial right lower lobe on sequence 6, image 25. In addition, there appears to be a new 3 mm nodule in the right lower lobe on sequence 6 image 7. New punctate nodule in the right middle lobe on image 22. Findings are suggestive for small lung metastases. Hepatobiliary: There are innumerable low-density lesions scattered throughout the liver of which are new and suggestive for hepatic metastatic disease. In addition, there is diffuse narrowing of the main right portal vein and evidence for thrombus involving right intrahepatic portal venous branches. The main and left intrahepatic portal veins appear to be patent. Poorly defined low-density structures throughout the right hepatic lobe are most with metastatic disease, most prominent in segment 8. Gallbladder is mildly distended. Mild pericholecystic fluid associated with the  gallbladder. Difficult to exclude intrahepatic biliary dilatation in the right hepatic lobe. Pancreas: Unremarkable. No pancreatic ductal dilatation or surrounding inflammatory changes. Spleen: There are least 2 small low-density structures in the spleen that are stable. Spleen is normal in size. Adrenals/Urinary Tract: Normal adrenal glands. Normal appearance of both kidneys without hydronephrosis. No suspicious renal lesions. No definite kidney stones. Normal appearance of the urinary bladder. Stomach/Bowel: Stomach is within normal limits. Appendix appears normal. No evidence of bowel wall thickening, distention, or inflammatory changes. Vascular/Lymphatic: Atherosclerotic disease in the abdominal aorta without aneurysm. Main visceral arteries are patent. Enlarged low-density lymph node in the upper abdominal pre caval region on  sequence 2 image 29. This precaval lymph node measures 1.5 cm in short axis and previously measured 1.3 cm on 06/22/2021. Enlarged retroperitoneal lymph nodes around the IVC. There is another index lymph node between the IVC and the aorta on image 34 that measures 1.0 cm in short axis. Thrombosis of right portal vein branches. Narrowing of the main right portal vein appears new. Main portal vein and portal vein confluence appear to be patent although the main portal vein is adjacent to the enlarged precaval lymph node. Reproductive: Prostate is unremarkable. Other: No significant ascites other than the minimal fluid around the gallbladder. Umbilical hernia containing fat. Bilateral inguinal hernias containing fat. Musculoskeletal: Expansile lytic lesion involving the anterior aspect of the right iliac wing that measures 3.4 x 3.1 cm and markedly enlarged since 06/18/2021. Again noted is lucency along the anterior L4 vertebral body and suggestive for a lucent bone lesion. There is also a lesion along the posteroinferior aspect of the T11 vertebral body suggestive for bone metastasis. Again noted is a lucent lesion involving the left ilium on sequence 2 image 54. IMPRESSION: 1. Imaging findings are compatible with progression of metastatic disease. New hepatic metastasis with new small pulmonary nodules, enlarged upper abdominal lymph nodes and progression of bone disease. 2. Significant change in the appearance of the liver with innumerable hepatic lesions. 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 The main right portal vein is also narrowed compared to the previous examination. 3. Small amount of pericholecystic fluid is nonspecific. Gallbladder is mildly distended. Findings are similar to the recent ultrasound findings. These findings could be associated with the hepatic disease. Ultrasound and CT findings are equivocal for cholecystitis  and consider further evaluation of the gallbladder with a HIDA scan (if clinically indicated). 4. Scattered bony lesions compatible with metastatic disease. Enlargement of a lytic expansile lesion involving the right iliac wing. 5. Umbilical hernia.  Bilateral inguinal hernias. 6.  Aortic Atherosclerosis (ICD10-I70.0). These results were called by telephone at the time of interpretation on 08/25/2021 at 11:24 am to provider [facility] , who verbally acknowledged these results. Electronically Signed   By: Richarda Overlie M.D.   On: 08/25/2021 11:24   CT L-SPINE NO CHARGE  Result Date: 08/25/2021 CLINICAL DATA:  Worsening back and right leg pain. History of metastatic lung cancer. EXAM: CT LUMBAR SPINE WITHOUT CONTRAST TECHNIQUE: Multidetector CT imaging of the lumbar spine was performed without intravenous contrast administration. Multiplanar CT image reconstructions were also generated. RADIATION DOSE REDUCTION: This exam was performed according to the departmental dose-optimization program which includes automated exposure control, adjustment of the mA and/or kV according to patient size and/or use of iterative reconstruction technique. COMPARISON:  CT lumbar myelogram dated August 20, 2021. MRI thoracic and lumbar spine dated Jul 30, 2021. FINDINGS: Segmentation: 5 lumbar type vertebrae. Alignment: Unchanged levoscoliosis. Unchanged mild retrolisthesis at L3-L4. Vertebrae: Small lytic lesions in the T11 and L4 vertebral bodies. No acute fracture. Paraspinal and other soft tissues: Please see separate CT abdomen pelvis report from same day. Incompletely visualized diffuse hepatic metastatic disease. Portacaval and aortocaval lymphadenopathy. Unchanged lytic lesion in the left iliac bone. Aortoiliac atherosclerotic vascular disease. Disc levels: Multilevel degenerative changes are unchanged since lumbar myelogram 5 days ago, including a right extraforaminal disc protrusion at L4-L5 closely approximating and  potentially irritating the right L4 nerve root. IMPRESSION: 1. No acute osseous abnormality. 2. Unchanged bony metastases in the T11 and L4 vertebral bodies and left iliac bone. 3. Incompletely visualized intra-abdominal metastatic disease involving the liver and upper abdominal lymph nodes. 4. Aortic Atherosclerosis (ICD10-I70.0). Electronically Signed   By: Obie Dredge M.D.   On: 08/25/2021 10:56   US Abdomen Limited  Result Date: 08/24/2021 CLINICAL DATA:  Elevated LFTs with right-sided abdominal pain. History of metastatic lung cancer. EXAM: ULTRASOUND ABDOMEN LIMITED RIGHT UPPER QUADRANT COMPARISON:  PET-CT June 22, 2021 FINDINGS: Gallbladder: Biliary sludge with gallbladder calculi measuring up to 3 mm. Wall thickening measuring up to 8 mm with some pericholecystic fluid. No sonographic Murphy sign noted by sonographer. Common bile duct: Diameter: 9 mm Liver: Heterogeneous appearance of the hepatic parenchyma with multiple small hepatic lesions identified measuring up to 1.6 cm. Portal vein is patent on color Doppler imaging with normal direction of blood flow towards the liver. Other: None. IMPRESSION: 1. Heterogeneous appearance of the hepatic parenchyma with multiple small hepatic lesions identified, suspicious for metastatic disease. 2. Cholelithiasis and sludge with gallbladder wall thickening measuring up to 8 mm but with a negative sonographic Murphy sign. Findings which are favored to reflect sequela of hepatic disease. If continued clinical concern for cholecystitis recommend further evaluation with nuclear medicine HIDA scan. 3. Prominence of the common bile duct measuring 9 mm, recommend correlation with laboratory values to assess for biliary obstruction. Electronically Signed   By: Maudry Mayhew M.D.   On: 08/24/2021 15:31   DG MYELOGRAPHY LUMBAR INJ LUMBOSACRAL  Result Date: 08/20/2021 CLINICAL DATA:  Disc displacement, lumbar. Right lower extremity radiculopathy extending to lateral  anterior thigh without relief from L4-5 epidural steroid injection. EXAM: LUMBAR MYELOGRAM FLUOROSCOPY: Radiation Exposure Index (as provided by the fluoroscopic device): Dose area product 716.04 uGy*m2 PROCEDURE: After thorough discussion of risks and benefits of the procedure including bleeding, infection, injury to nerves, blood vessels, adjacent structures as well as headache and CSF leak, written and oral informed consent was obtained. Consent was obtained by Dr. Marin Roberts. Time out form was completed. Patient was positioned prone on the fluoroscopy table. Local anesthesia was provided with 1% lidocaine without epinephrine after prepped and draped in the usual sterile fashion. Puncture was performed at L2-3 using a 3 1/2 inch 22-gauge spinal needle via left paramidline approach. Using a single pass through the dura, the needle was placed within the thecal sac, with return of clear CSF. 15 mL of Isovue M-200 was injected into the thecal sac, with normal opacification of the nerve roots and cauda equina consistent with free flow within the subarachnoid space. I personally performed the lumbar puncture and administered the intrathecal contrast. I also personally supervised acquisition of the myelogram images. TECHNIQUE: Contiguous axial images were obtained through the Lumbar spine after the intrathecal infusion of infusion. Coronal and sagittal reconstructions were obtained of the axial image sets. COMPARISON:  MRI of the  lumbar spine without contrast 07/30/2021 FINDINGS: LUMBAR MYELOGRAM FINDINGS: Five non rib-bearing lumbar type vertebral bodies are present. Subtle changes are noted at the known L4 lesion. Vertebral body heights are maintained. Slight retrolisthesis is present at L3-4. This is slightly worse with extension. No other significant listhesis is present. Atherosclerotic calcifications are present in the aorta without aneurysm. Broad-based disc protrusion is present at L3-4. This results in  moderate central canal stenosis subarticular narrowing bilaterally with right subarticular narrowing. Right subarticular narrowing is also present at L4-5 secondary to a broad-based disc protrusion. The nerve roots otherwise fill normally on both sides. CT LUMBAR MYELOGRAM FINDINGS: Lumbar spine is imaged from T12 through S2. Vertebral body heights are maintained. Lucent lesion anteriorly at L4 measures 9 mm cephalo caudad, compatible with the suspected metastasis. Multiple lucent lesions are again noted within the left iliac bone. There is destruction of the medial cortex. Atherosclerotic calcifications are present within the aorta and branch vessels. Visualized abdomen is otherwise unremarkable. No significant retroperitoneal adenopathy is present. Mild leftward curvature of the lumbar spine is centered at L3-4. T12-L1: Negative. L1-2: Mild facet hypertrophy is present bilaterally. No significant stenosis is present. L2-3: Mild facet hypertrophy is present. No significant disc protrusion or stenosis is present. L3-4: A broad-based disc protrusion is asymmetric to the right. A left-sided synovial cyst creates some mass effect on the posterior canal. Right greater than left subarticular narrowing is present. Moderate foraminal narrowing is worse right than left. L4-5: A broad-based disc protrusion is again seen. Left laminectomy noted. The canal is decompressed posteriorly on the left. Facet spurring and disc protrusions result in moderate foraminal stenosis, right greater than left. Mild subarticular narrowing is worse right than left. L5-S1: A shallow central disc protrusion is present without significant stenosis. IMPRESSION: 1. Left laminectomy at L4-5 with decompression of the canal posteriorly on the left. 2. Moderate foraminal stenosis bilaterally at L4-5 is worse on the right. 3. Mild subarticular narrowing at L4-5 is worse right than left. 4. Moderate central canal stenosis at L3-4 with right greater than  left subarticular narrowing. 5. Mild retrolisthesis at L3-4 is slightly exacerbated with extension. 6. Moderate foraminal narrowing bilaterally at L3-4 is worse on the right. 7. Suspected metastases at L4 and in the left iliac bone. 8. Mild facet hypertrophy at L1-2 and L2-3 without significant stenosis. 9. Aortic Atherosclerosis (ICD10-I70.0). Electronically Signed   By: Marin Roberts M.D.   On: 08/20/2021 12:13   CT LUMBAR SPINE W CONTRAST  Result Date: 08/20/2021 CLINICAL DATA:  Disc displacement, lumbar. Right lower extremity radiculopathy extending to lateral anterior thigh without relief from L4-5 epidural steroid injection. EXAM: LUMBAR MYELOGRAM FLUOROSCOPY: Radiation Exposure Index (as provided by the fluoroscopic device): Dose area product 716.04 uGy*m2 PROCEDURE: After thorough discussion of risks and benefits of the procedure including bleeding, infection, injury to nerves, blood vessels, adjacent structures as well as headache and CSF leak, written and oral informed consent was obtained. Consent was obtained by Dr. Marin Roberts. Time out form was completed. Patient was positioned prone on the fluoroscopy table. Local anesthesia was provided with 1% lidocaine without epinephrine after prepped and draped in the usual sterile fashion. Puncture was performed at L2-3 using a 3 1/2 inch 22-gauge spinal needle via left paramidline approach. Using a single pass through the dura, the needle was placed within the thecal sac, with return of clear CSF. 15 mL of Isovue M-200 was injected into the thecal sac, with normal opacification of the nerve roots and cauda  equina consistent with free flow within the subarachnoid space. I personally performed the lumbar puncture and administered the intrathecal contrast. I also personally supervised acquisition of the myelogram images. TECHNIQUE: Contiguous axial images were obtained through the Lumbar spine after the intrathecal infusion of infusion. Coronal  and sagittal reconstructions were obtained of the axial image sets. COMPARISON:  MRI of the lumbar spine without contrast 07/30/2021 FINDINGS: LUMBAR MYELOGRAM FINDINGS: Five non rib-bearing lumbar type vertebral bodies are present. Subtle changes are noted at the known L4 lesion. Vertebral body heights are maintained. Slight retrolisthesis is present at L3-4. This is slightly worse with extension. No other significant listhesis is present. Atherosclerotic calcifications are present in the aorta without aneurysm. Broad-based disc protrusion is present at L3-4. This results in moderate central canal stenosis subarticular narrowing bilaterally with right subarticular narrowing. Right subarticular narrowing is also present at L4-5 secondary to a broad-based disc protrusion. The nerve roots otherwise fill normally on both sides. CT LUMBAR MYELOGRAM FINDINGS: Lumbar spine is imaged from T12 through S2. Vertebral body heights are maintained. Lucent lesion anteriorly at L4 measures 9 mm cephalo caudad, compatible with the suspected metastasis. Multiple lucent lesions are again noted within the left iliac bone. There is destruction of the medial cortex. Atherosclerotic calcifications are present within the aorta and branch vessels. Visualized abdomen is otherwise unremarkable. No significant retroperitoneal adenopathy is present. Mild leftward curvature of the lumbar spine is centered at L3-4. T12-L1: Negative. L1-2: Mild facet hypertrophy is present bilaterally. No significant stenosis is present. L2-3: Mild facet hypertrophy is present. No significant disc protrusion or stenosis is present. L3-4: A broad-based disc protrusion is asymmetric to the right. A left-sided synovial cyst creates some mass effect on the posterior canal. Right greater than left subarticular narrowing is present. Moderate foraminal narrowing is worse right than left. L4-5: A broad-based disc protrusion is again seen. Left laminectomy noted. The  canal is decompressed posteriorly on the left. Facet spurring and disc protrusions result in moderate foraminal stenosis, right greater than left. Mild subarticular narrowing is worse right than left. L5-S1: A shallow central disc protrusion is present without significant stenosis. IMPRESSION: 1. Left laminectomy at L4-5 with decompression of the canal posteriorly on the left. 2. Moderate foraminal stenosis bilaterally at L4-5 is worse on the right. 3. Mild subarticular narrowing at L4-5 is worse right than left. 4. Moderate central canal stenosis at L3-4 with right greater than left subarticular narrowing. 5. Mild retrolisthesis at L3-4 is slightly exacerbated with extension. 6. Moderate foraminal narrowing bilaterally at L3-4 is worse on the right. 7. Suspected metastases at L4 and in the left iliac bone. 8. Mild facet hypertrophy at L1-2 and L2-3 without significant stenosis. 9. Aortic Atherosclerosis (ICD10-I70.0). Electronically Signed   By: Marin Roberts M.D.   On: 08/20/2021 12:13    Labs:  CBC: Recent Labs    09/01/21 0323 09/02/21 1250 09/03/21 0535 09/10/21 0803  WBC 12.3* 12.4* 12.9* 16.0*  HGB 10.9* 12.0* 11.8* 11.6*  HCT 33.4* 36.8* 36.5* 35.3*  PLT 458* 481* 478* 553*    COAGS: Recent Labs    08/25/21 0816  INR 1.6*  APTT 38*    BMP: Recent Labs    08/31/21 0503 09/01/21 0323 09/03/21 0535 09/10/21 0803  NA 136 136 133* 129*  K 4.6 4.0 4.0 4.2  CL 101 99 98 96*  CO2 24 26 24 22   GLUCOSE 147* 113* 138* 111*  BUN 10 13 13 15   CALCIUM 8.9 8.9 8.8* 9.0  CREATININE 0.57* 0.61 0.54* 0.51*  GFRNONAA >60 >60 >60 >60    LIVER FUNCTION TESTS: Recent Labs    08/31/21 0503 09/01/21 0323 09/03/21 0535 09/10/21 0803  BILITOT 1.0 0.9 1.3* 1.3*  AST 42* 39 48* 78*  ALT 42 39 44 51*  ALKPHOS 758* 765* 850* 1,066*  PROT 6.5 6.4* 7.0 6.3*  ALBUMIN 2.7* 2.9* 3.2* 3.3*    TUMOR MARKERS: No results for input(s): "AFPTM", "CEA", "CA199", "CHROMGRNA" in the  last 8760 hours.  Assessment and Plan:  Extremely unfortunate 61 year old gentleman with metastatic lung cancer and severe, debilitating cancer pain as well as radiculopathy from a right extraforaminal disc protrusion.  His pain appears to be a combination of a persistent right L4 radiculopathy, and an expansile lesion arising from the right iliac crest.  His other multifocal bone lesions are not associated with pathologic fracture or significant expansion or periosteal tension.  His pain is refractory to all attempts at pain control including Tylenol, Toradol, Dilaudid, methadone and even ketamine while in the hospital.  His most recent right L4 nerve root block did provide significant symptomatic relief but only for 2-3 days before his symptoms began to recur.  It has been 2 weeks and this injection can now be safely repeated.  Additionally, on my exam, he does have some focal tenderness along the right iliac crest in the region of the known expansile lesion.  This is something that we could target with osteocool in an effort to provide some symptomatic relief.  1.)  Schedule for repeat right L4 nerve root block as soon as possible.  I also anticipate he will require a 3rd nerve root block 2 weeks after this next 1, likely in the middle of July.  2.)  Schedule for right iliac crest osteocool procedure to be performed at [facility] with moderate sedation and CT.   Electronically Signed: Sterling Big 09/13/2021, 10:20 AM   I spent a total of  15 Minutes in face to face in clinical consultation, greater than 50% of which was counseling/coordinating care for right L4 radiculopathy and cancer pain from osseous metastatic disease.

## 2021-09-14 ENCOUNTER — Telehealth: Payer: Self-pay | Admitting: *Deleted

## 2021-09-14 ENCOUNTER — Inpatient Hospital Stay: Payer: Medicare HMO

## 2021-09-14 ENCOUNTER — Other Ambulatory Visit: Payer: Self-pay | Admitting: *Deleted

## 2021-09-14 VITALS — BP 123/83 | HR 103 | Temp 98.0°F | Resp 17

## 2021-09-14 DIAGNOSIS — C7931 Secondary malignant neoplasm of brain: Secondary | ICD-10-CM | POA: Diagnosis present

## 2021-09-14 DIAGNOSIS — C3492 Malignant neoplasm of unspecified part of left bronchus or lung: Secondary | ICD-10-CM | POA: Diagnosis present

## 2021-09-14 DIAGNOSIS — C3490 Malignant neoplasm of unspecified part of unspecified bronchus or lung: Secondary | ICD-10-CM

## 2021-09-14 DIAGNOSIS — M5440 Lumbago with sciatica, unspecified side: Secondary | ICD-10-CM

## 2021-09-14 DIAGNOSIS — R110 Nausea: Secondary | ICD-10-CM

## 2021-09-14 DIAGNOSIS — C7951 Secondary malignant neoplasm of bone: Secondary | ICD-10-CM | POA: Diagnosis present

## 2021-09-14 DIAGNOSIS — Z79899 Other long term (current) drug therapy: Secondary | ICD-10-CM | POA: Diagnosis not present

## 2021-09-14 LAB — CBC WITH DIFFERENTIAL (CANCER CENTER ONLY)
Abs Immature Granulocytes: 0.12 10*3/uL — ABNORMAL HIGH (ref 0.00–0.07)
Basophils Absolute: 0 10*3/uL (ref 0.0–0.1)
Basophils Relative: 0 %
Eosinophils Absolute: 0 10*3/uL (ref 0.0–0.5)
Eosinophils Relative: 0 %
HCT: 34.1 % — ABNORMAL LOW (ref 39.0–52.0)
Hemoglobin: 11.2 g/dL — ABNORMAL LOW (ref 13.0–17.0)
Immature Granulocytes: 1 %
Lymphocytes Relative: 11 %
Lymphs Abs: 1.8 10*3/uL (ref 0.7–4.0)
MCH: 30.4 pg (ref 26.0–34.0)
MCHC: 32.8 g/dL (ref 30.0–36.0)
MCV: 92.4 fL (ref 80.0–100.0)
Monocytes Absolute: 1.1 10*3/uL — ABNORMAL HIGH (ref 0.1–1.0)
Monocytes Relative: 7 %
Neutro Abs: 12.8 10*3/uL — ABNORMAL HIGH (ref 1.7–7.7)
Neutrophils Relative %: 81 %
Platelet Count: 624 10*3/uL — ABNORMAL HIGH (ref 150–400)
RBC: 3.69 MIL/uL — ABNORMAL LOW (ref 4.22–5.81)
RDW: 16.8 % — ABNORMAL HIGH (ref 11.5–15.5)
WBC Count: 15.8 10*3/uL — ABNORMAL HIGH (ref 4.0–10.5)
nRBC: 0 % (ref 0.0–0.2)

## 2021-09-14 LAB — CMP (CANCER CENTER ONLY)
ALT: 70 U/L — ABNORMAL HIGH (ref 0–44)
AST: 64 U/L — ABNORMAL HIGH (ref 15–41)
Albumin: 3.1 g/dL — ABNORMAL LOW (ref 3.5–5.0)
Alkaline Phosphatase: 1039 U/L — ABNORMAL HIGH (ref 38–126)
Anion gap: 10 (ref 5–15)
BUN: 10 mg/dL (ref 6–20)
CO2: 21 mmol/L — ABNORMAL LOW (ref 22–32)
Calcium: 8.4 mg/dL — ABNORMAL LOW (ref 8.9–10.3)
Chloride: 98 mmol/L (ref 98–111)
Creatinine: 0.41 mg/dL — ABNORMAL LOW (ref 0.61–1.24)
GFR, Estimated: >60 mL/min (ref >60)
Glucose, Bld: 142 mg/dL — ABNORMAL HIGH (ref 70–99)
Potassium: 4 mmol/L (ref 3.5–5.1)
Sodium: 129 mmol/L — ABNORMAL LOW (ref 135–145)
Total Bilirubin: 1.6 mg/dL — ABNORMAL HIGH (ref 0.3–1.2)
Total Protein: 6 g/dL — ABNORMAL LOW (ref 6.5–8.1)

## 2021-09-14 MED ORDER — HYDROMORPHONE HCL 4 MG PO TABS: 4.0000 mg | ORAL_TABLET | ORAL | 0 refills | Status: AC | PRN

## 2021-09-14 MED ORDER — SODIUM CHLORIDE 0.9 % IV SOLN: INTRAVENOUS | Status: AC

## 2021-09-14 MED ORDER — SODIUM CHLORIDE 0.9 % IV SOLN
20.0000 mg | Freq: Once | INTRAVENOUS | Status: AC
  Administered 2021-09-14: 20 mg via INTRAVENOUS
  Filled 2021-09-14: qty 20

## 2021-09-14 MED ORDER — HYDROMORPHONE HCL 1 MG/ML IJ SOLN
2.0000 mg | INTRAMUSCULAR | Status: DC | PRN
  Administered 2021-09-14: 2 mg via INTRAVENOUS
  Filled 2021-09-14: qty 2

## 2021-09-14 MED ORDER — LORAZEPAM 1 MG PO TABS
1.0000 mg | ORAL_TABLET | Freq: Three times a day (TID) | ORAL | 0 refills | Status: AC | PRN
Start: 2021-09-14 — End: ?

## 2021-09-15 ENCOUNTER — Telehealth: Payer: Self-pay | Admitting: *Deleted

## 2021-09-15 ENCOUNTER — Inpatient Hospital Stay: Payer: Medicare HMO

## 2021-09-15 VITALS — BP 151/96 | HR 108 | Temp 97.8°F | Resp 17

## 2021-09-15 DIAGNOSIS — C3490 Malignant neoplasm of unspecified part of unspecified bronchus or lung: Secondary | ICD-10-CM

## 2021-09-15 DIAGNOSIS — C3492 Malignant neoplasm of unspecified part of left bronchus or lung: Secondary | ICD-10-CM | POA: Diagnosis not present

## 2021-09-15 MED ORDER — SODIUM CHLORIDE 0.9 % IV SOLN: INTRAVENOUS | Status: AC

## 2021-09-15 MED ORDER — SODIUM CHLORIDE 0.9 % IV SOLN
20.0000 mg | Freq: Once | INTRAVENOUS | Status: AC
  Administered 2021-09-15: 20 mg via INTRAVENOUS
  Filled 2021-09-15: qty 20

## 2021-09-15 MED ORDER — HYDROMORPHONE HCL 1 MG/ML IJ SOLN
2.0000 mg | INTRAMUSCULAR | Status: DC | PRN
  Administered 2021-09-15 (×2): 2 mg via INTRAVENOUS
  Filled 2021-09-15 (×2): qty 2

## 2021-09-15 NOTE — Telephone Encounter (Signed)
Call received from pt.'s wife stating that pt had a rough night and would like to know if pt can come in today for IVF's and pain medicine.  [name] notified.  Pt.'s wife instructed per order of [name] to bring pt in today and tomorrow for IVF's and pain medicine.  Langley Gauss states that she can be here at 10:30AM today.  Message sent to scheduling.

## 2021-09-15 NOTE — Patient Instructions (Signed)

## 2021-09-16 ENCOUNTER — Other Ambulatory Visit: Payer: Self-pay

## 2021-09-16 ENCOUNTER — Inpatient Hospital Stay: Payer: Medicare HMO

## 2021-09-16 ENCOUNTER — Encounter (HOSPITAL_COMMUNITY): Payer: Self-pay | Admitting: Neurosurgery

## 2021-09-16 VITALS — BP 147/95 | HR 96 | Temp 97.9°F | Resp 17

## 2021-09-16 DIAGNOSIS — C7951 Secondary malignant neoplasm of bone: Secondary | ICD-10-CM

## 2021-09-16 DIAGNOSIS — C7931 Secondary malignant neoplasm of brain: Secondary | ICD-10-CM

## 2021-09-16 DIAGNOSIS — C3490 Malignant neoplasm of unspecified part of unspecified bronchus or lung: Secondary | ICD-10-CM

## 2021-09-16 DIAGNOSIS — C3492 Malignant neoplasm of unspecified part of left bronchus or lung: Secondary | ICD-10-CM | POA: Diagnosis not present

## 2021-09-16 MED ORDER — HYDROMORPHONE HCL 1 MG/ML IJ SOLN
2.0000 mg | INTRAMUSCULAR | Status: DC | PRN
  Administered 2021-09-16 (×2): 2 mg via INTRAVENOUS
  Filled 2021-09-16 (×3): qty 2

## 2021-09-16 MED ORDER — SODIUM CHLORIDE 0.9 % IV SOLN
20.0000 mg | Freq: Once | INTRAVENOUS | Status: AC
  Administered 2021-09-16: 20 mg via INTRAVENOUS
  Filled 2021-09-16: qty 20

## 2021-09-16 MED ORDER — SODIUM CHLORIDE 0.9 % IV SOLN: INTRAVENOUS | Status: AC

## 2021-09-16 MED ORDER — LORAZEPAM 2 MG/ML IJ SOLN
1.0000 mg | Freq: Once | INTRAMUSCULAR | Status: DC
  Filled 2021-09-16: qty 1

## 2021-09-16 NOTE — Anesthesia Preprocedure Evaluation (Addendum)
Anesthesia Evaluation  Patient identified by MRN, date of birth, ID band Patient awake    Reviewed: Allergy & Precautions, NPO status , Patient's Chart, lab work & pertinent test results  Airway Mallampati: II  TM Distance: >3 FB Neck ROM: Full    Dental no notable dental hx.    Pulmonary COPD, Current Smoker, former smoker,    Pulmonary exam normal breath sounds clear to auscultation       Cardiovascular hypertension, Pt. on medications + CAD and + Past MI  Normal cardiovascular exam Rhythm:Irregular Rate:Tachycardia  Stress Echo 07/17/20 ([facility]): SUMMARY  The patient had no chest pain during stress  The patient achieved 79 % of maximum predicted heart rate.  Normal left ventricular function at rest.  There were no segmental wall motion abnormalities at rest  Normal left ventricular function and global wall motion with stress.  There was normal augmentation of all left ventricular wall segments  with dobutamine.  Negative dobutamine echocardiography at the subtarget heart rate.  There is no comparison study available.   Echo 07/16/20 ([facility]): SUMMARY  The left ventricular size is normal.  There is normal left ventricular wall thickness.  Left ventricular systolic function is normal.  LV ejection fraction = 60-65%.  The left ventricular wall motion is normal.  Left ventricular filling pattern is normal.  The right ventricle is normal in size and function.  There is no significant valvular stenosis or regurgitation.  The IVC is normal in size with an inspiratory collapse of greater then  50%, suggesting normal right atrial pressure.  There is no pericardial effusion.  Probably no significant change in comparison with the prior study  noted     Neuro/Psych PSYCHIATRIC DISORDERS Anxiety Depression negative neurological ROS     GI/Hepatic Neg liver ROS, GERD  ,  Endo/Other  diabetes  Renal/GU negative Renal  ROS     Musculoskeletal negative musculoskeletal ROS (+)   Abdominal   Peds  Hematology  (+) Blood dyscrasia, anemia ,   Anesthesia Other Findings   Reproductive/Obstetrics                        Anesthesia Physical Anesthesia Plan  ASA: 3  Anesthesia Plan: General   Post-op Pain Management: Dilaudid IV and Ketamine IV*   Induction: Intravenous  PONV Risk Score and Plan: 3 and Ondansetron, Dexamethasone, Treatment may vary due to age or medical condition and Midazolam  Airway Management Planned: Oral ETT and Video Laryngoscope Planned  Additional Equipment: Arterial line  Intra-op Plan:   Post-operative Plan: Possible Post-op intubation/ventilation  Informed Consent: I have reviewed the patients History and Physical, chart, labs and discussed the procedure including the risks, benefits and alternatives for the proposed anesthesia with the patient or authorized representative who has indicated his/her understanding and acceptance.     Dental advisory given  Plan Discussed with: CRNA  Anesthesia Plan Comments: (PAT note written 09/16/2021 by Myra Gianotti, PA-C. )     Anesthesia Quick Evaluation

## 2021-09-16 NOTE — Progress Notes (Addendum)
Mr. Bill Garza gave verbal instruction to speak to his wife Bill Garza. Mrs. Bill Garza reports that patient will not speak to the staff, everything has to go through wife.  Bill Garza reports that [name] has not complained of chest pain, he sometimes is short of breath due to pain.  Mrs. Bill Garza denies having any s/s of Covid in her household.  Patient denies any known exposure to Covid.   Mrs. Bill Garza reports that Mr. Bill Garza is not eating and only takes pain medication for pain. Mr. Bill Garza has been seen several in Dr. Antonieta Pert office for dehydration and IV pain medication. Patient's pain is in his back, Dilaudid by mouth is not relieving his pain.  Mr. Bill Garza 's PCP is Bill Eng, NP. Cardiologist is with Bill Garza, Mrs Bill Garza reports that, Mr. Bill Garza is seen every year.  I have requested records.   Mr. Bill Garza has type II diabetes. Mrs. Bill Garza reports that patient does not check CBG, I asked Mrs. Bill Garza to see if he will check it in am. When patient awakens and every 2 hours until he leaves to come to the hospital, to make sure it does not drop below 70. I instructed Mrs.Bill Garza  to hold Metformin in am.   Mr.Bill Garza has been on Xaralto , patient's last dose was 09/14/21.  I asked PA for anesthesia to review.

## 2021-09-16 NOTE — Patient Instructions (Signed)

## 2021-09-16 NOTE — Progress Notes (Signed)
Anesthesia Chart Review: SAME DAY WORK-UP  Case: [id number] Date/Time: 09/16/2021 1601   Procedure: Right L4-5 Microdiscectomy, possibly Right L3-4 (Right) - 3C/RM 18   Anesthesia type: General   Pre-op diagnosis: Disc displacement, Lumbar   Location: [facility] OR ROOM 21 / [place] [facility]   Surgeons: Ashok Pall, MD       DISCUSSION: Patient is a 61 year old male scheduled for the above procedure. He was referred by his oncologist [name].   History includes former smoker (quit 07/20/20), COPD, CAD (inferior MI with acute RCA occlusion s/p BMS proximal/mid RCA ~2003; DES mid/distal CX 11/02/09), HLD, DM2, GERD, chronic back pain, non-small cell lung cancer (stage IV with mets to brain, liver with thrombosed portal vain, spine: LU lobectomy 09/02/20, s/p chemotherapy; left craniotomy for resection of tumor 11/16/20, 07/01/11), back surgery.   Started on Xarelto during June admission (08/25/21-09/03/21) for intractable back and abdominal pain. CT abdomen and pelvis revealed findings compatible with progression of metastatic disease, new hepatic metastasis with numerous small pulmonary nodules, enlarged upper abdominal lymph nodes and progression of bone disease.  Innumerable hepatic lesions.  Thrombosed right portal vein.  Gallbladder is mildly distended.  Scattered bony lesions compatible with metastatic disease.  Enlargement of lytic expansile lesion involving the right iliac wing. He was started on heparin drip for new portal vein thrombosis.  He underwent L4 nerve root block and transforaminal epidural on 08/30/2021.  He was transition from heparin to Xarelto on 08/31/2021.  His cardiologist is with [facility] in [facility]. He had a negative stress echo in April 2022.   He is a same-day work-up.  Reported last Xarelto 09/14/21.  Anesthesia team to evaluate on the day of surgery.  Of note, Bill Garza wanted communication through his wife for preoperative RN interview.    VS: Ht 6' (1.829 m)   Wt 72.1 kg   BMI 21.56  kg/m  BP Readings from Last 3 Encounters:  09/16/21 (!) 147/95  09/15/21 (!) 151/96  09/14/21 123/83   Pulse Readings from Last 3 Encounters:  09/16/21 96  09/15/21 (!) 108  09/14/21 (!) 103     PROVIDERS: Aleen Campi, NP is PCP Stevphen Meuse, MD is cardiologist ([facility])  [name], MD is HEM-ONC Eppie Gibson, MD is RAD-ONC [name], MD is IR. Evaluated 09/13/21 for "metastatic lung cancer and severe, debilitating cancer pain as well as radiculopathy from a right extraforaminal disc protrusion.  His pain appears to be a combination of a persistent right L4 radiculopathy, and an expansile lesion arising from the right iliac crest.  His other multifocal bone lesions are not associated with pathologic fracture or significant expansion or periosteal tension.  His pain is refractory to all attempts at pain control including Tylenol, Toradol, Dilaudid, methadone and even ketamine while in the hospital." He has received a right L4 nerve block with temporary relief after 2-3 days. Recommended scheduling for for repeat nerve block and for right iliac crest osteocool procedure.    LABS: Most recent lab results include: Lab Results  Component Value Date   WBC 15.8 (H) 09/14/2021   HGB 11.2 (L) 09/14/2021   HCT 34.1 (L) 09/14/2021   PLT 624 (H) 09/14/2021   GLUCOSE 142 (H) 09/14/2021   ALT 70 (H) 09/14/2021   AST 64 (H) 09/14/2021   NA 129 (L) 09/14/2021   K 4.0 09/14/2021   CL 98 09/14/2021   CREATININE 0.41 (L) 09/14/2021   BUN 10 09/14/2021   CO2 21 (L)  09/14/2021   TSH 1.819 09/01/2021   INR 1.6 (H) 08/25/2021   HGBA1C 7.1 (H) 08/25/2021    IMAGES: CXR 09/02/21: FINDINGS: The heart size and mediastinal contours are within normal limits. Both lungs are clear. The visualized skeletal structures are unremarkable. IMPRESSION: No active cardiopulmonary disease.  MRI Liver 08/26/21: IMPRESSION: 1. Innumerable (> 30) enhancing liver masses scattered  throughout the liver, confluent in the right liver, compatible with metastatic disease, generally new since 06/18/2021 CT. 2. Several enhancing osseous lesions throughout the visualized axial skeleton, increased from 06/18/2021 CT, compatible with progressive multifocal osseous metastatic disease. 3. Mild-to-moderate intrahepatic biliary ductal dilatation throughout the right liver and extrinsic narrowing of the right portal vein tree by the confluent central right liver metastatic disease. Top normal caliber common bile duct (6 mm diameter) with smooth distal tapering. No choledocholithiasis. 4. Small amount of gallbladder sludge. Nonspecific mild gallbladder wall thickening, potentially reactive or due to noninflammatory edema. No cholelithiasis. 5. 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   MRI T/L spine 08/25/21: IMPRESSION: 1. New and progressive thoracic and lumbosacral osseous metastatic disease without significant extraosseous extension or pathologic fracture. 2. Enlarging bilateral iliac bone and upper abdominal nodal metastatic disease. 3. Unchanged multilevel lumbar spondylosis as described above. Right extraforaminal disc protrusion contacts and potentially irritates the exiting right L4 nerve root.    EKG: 08/31/21: Sinus rhythm with occasional Premature ventricular complexes and Premature atrial complexes Right bundle branch block Abnormal ECG no interval change from earlier same day Confirmed by Charlesetta Shanks [phone]) on 09/02/2021 4:54:42 PM   CV: Stress Echo 07/17/20 ([facility]): SUMMARY  The patient had no chest pain during stress  The patient achieved 79 % of maximum predicted heart rate.  Normal left ventricular function at rest.  There were no segmental wall motion abnormalities at rest  Normal left ventricular function and global wall motion with stress.  There was normal augmentation of all left ventricular wall segments  with dobutamine.  Negative  dobutamine echocardiography at the subtarget heart rate.  There is no comparison study available.   Echo 07/16/20 ([facility]): SUMMARY  The left ventricular size is normal.  There is normal left ventricular wall thickness.  Left ventricular systolic function is normal.  LV ejection fraction = 60-65%.  The left ventricular wall motion is normal.  Left ventricular filling pattern is normal.  The right ventricle is normal in size and function.  There is no significant valvular stenosis or regurgitation.  The IVC is normal in size with an inspiratory collapse of greater then  50%, suggesting normal right atrial pressure.  There is no pericardial effusion.  Probably no significant change in comparison with the prior study  noted    Per 11/01/19 [facility] note: "Chronic coronary artery disease: History of inferior wall MI with acute RCA occlusion status post bare-metal stent to the proximal/mid RCA with normal LV function. He had a elective LHC performed on 11/02/2009 due to residual circumflex disease with DES to the mid/distal circumflex coronary artery."    Past Medical History:  Diagnosis Date   Chronic back pain    Chronic GERD    Chronic pain    COPD (chronic obstructive pulmonary disease) (HCC)    Coronary artery disease    Depression    Diabetes mellitus without complication ([place])    Gastroparesis    Goals of care, counseling/discussion 01/27/2021   High risk medication use    Hyperlipidemia    Lung cancer ([place])  Metastasis to liver [facility])    Metastatic cancer to spine [facility])    [place]   Non-small cell lung cancer metastatic to brain ([place]) 01/27/2021   Portal vein thrombosis    Restless leg syndrome     Past Surgical History:  Procedure Laterality Date   BACK SURGERY     CARDIAC CATHETERIZATION     IR FLUORO GUIDED NEEDLE PLC ASPIRATION/INJECTION LOC  08/30/2021   IR RADIOLOGIST EVAL & MGMT  09/13/2021     MEDICATIONS: No current facility-administered medications for this encounter.    acetaminophen (TYLENOL) 500 MG tablet   albuterol (PROVENTIL) (2.5 MG/3ML) 0.083% nebulizer solution   albuterol (VENTOLIN HFA) 108 (90 Base) MCG/ACT inhaler   aspirin EC 81 MG tablet   budesonide-formoterol (SYMBICORT) 160-4.5 MCG/ACT inhaler   cetirizine (ZYRTEC) 10 MG tablet   cyclobenzaprine (FLEXERIL) 5 MG tablet   dronabinol (MARINOL) 5 MG capsule   escitalopram (LEXAPRO) 5 MG tablet   ketorolac (TORADOL) 10 MG tablet   metFORMIN (GLUCOPHAGE) 500 MG tablet   montelukast (SINGULAIR) 10 MG tablet   Multiple Vitamin (MULTIVITAMIN) capsule   naloxone (NARCAN) nasal spray 4 mg/0.1 mL   ondansetron (ZOFRAN) 8 MG tablet   pantoprazole (PROTONIX) 40 MG tablet   Rivaroxaban (XARELTO) 15 MG TABS tablet   senna-docusate (SENOKOT-S) 8.6-50 MG tablet   Blood Glucose Monitoring Suppl (ACCU-CHEK GUIDE) w/Device KIT   Blood Glucose Monitoring Suppl (GLUCOCOM BLOOD GLUCOSE MONITOR) DEVI   HYDROmorphone (DILAUDID) 4 MG tablet   LORazepam (ATIVAN) 1 MG tablet   Oxycodone HCl 10 MG TABS   polyethylene glycol (MIRALAX / GLYCOLAX) 17 g packet   [START ON 09/21/2021] rivaroxaban (XARELTO) 20 MG TABS tablet    HYDROmorphone (DILAUDID) injection 2 mg   LORazepam (ATIVAN) injection 1 mg    Myra Gianotti, PA-C Surgical Short Stay/Anesthesiology [facility] Phone [phone] [facility] Phone [phone] 09/16/2021 4:54 PM

## 2021-09-17 ENCOUNTER — Inpatient Hospital Stay (HOSPITAL_COMMUNITY): Admission: RE | Disposition: E | Payer: Self-pay | Source: Home / Self Care | Attending: Neurosurgery

## 2021-09-17 ENCOUNTER — Other Ambulatory Visit: Payer: Self-pay

## 2021-09-17 ENCOUNTER — Ambulatory Visit (HOSPITAL_COMMUNITY): Payer: Medicare HMO

## 2021-09-17 ENCOUNTER — Inpatient Hospital Stay (HOSPITAL_COMMUNITY)
Admission: RE | Admit: 2021-09-17 | Discharge: 2021-10-19 | DRG: 518 | Disposition: E | Payer: Medicare HMO | Attending: Neurosurgery | Admitting: Neurosurgery

## 2021-09-17 ENCOUNTER — Ambulatory Visit (HOSPITAL_COMMUNITY): Payer: Medicare HMO | Admitting: Vascular Surgery

## 2021-09-17 ENCOUNTER — Encounter (HOSPITAL_COMMUNITY): Payer: Self-pay | Admitting: Neurosurgery

## 2021-09-17 ENCOUNTER — Ambulatory Visit (HOSPITAL_BASED_OUTPATIENT_CLINIC_OR_DEPARTMENT_OTHER): Payer: Medicare HMO | Admitting: Vascular Surgery

## 2021-09-17 DIAGNOSIS — I10 Essential (primary) hypertension: Secondary | ICD-10-CM | POA: Diagnosis present

## 2021-09-17 DIAGNOSIS — F419 Anxiety disorder, unspecified: Secondary | ICD-10-CM | POA: Diagnosis present

## 2021-09-17 DIAGNOSIS — Y95 Nosocomial condition: Secondary | ICD-10-CM | POA: Diagnosis not present

## 2021-09-17 DIAGNOSIS — J9600 Acute respiratory failure, unspecified whether with hypoxia or hypercapnia: Secondary | ICD-10-CM

## 2021-09-17 DIAGNOSIS — C3490 Malignant neoplasm of unspecified part of unspecified bronchus or lung: Secondary | ICD-10-CM | POA: Diagnosis present

## 2021-09-17 DIAGNOSIS — Z6821 Body mass index (BMI) 21.0-21.9, adult: Secondary | ICD-10-CM

## 2021-09-17 DIAGNOSIS — K559 Vascular disorder of intestine, unspecified: Secondary | ICD-10-CM | POA: Diagnosis not present

## 2021-09-17 DIAGNOSIS — Z931 Gastrostomy status: Secondary | ICD-10-CM | POA: Diagnosis not present

## 2021-09-17 DIAGNOSIS — R6521 Severe sepsis with septic shock: Secondary | ICD-10-CM | POA: Diagnosis not present

## 2021-09-17 DIAGNOSIS — Z515 Encounter for palliative care: Secondary | ICD-10-CM | POA: Diagnosis not present

## 2021-09-17 DIAGNOSIS — J690 Pneumonitis due to inhalation of food and vomit: Secondary | ICD-10-CM | POA: Diagnosis not present

## 2021-09-17 DIAGNOSIS — R64 Cachexia: Secondary | ICD-10-CM | POA: Diagnosis present

## 2021-09-17 DIAGNOSIS — C3492 Malignant neoplasm of unspecified part of left bronchus or lung: Secondary | ICD-10-CM | POA: Diagnosis not present

## 2021-09-17 DIAGNOSIS — R770 Abnormality of albumin: Secondary | ICD-10-CM | POA: Diagnosis not present

## 2021-09-17 DIAGNOSIS — J9601 Acute respiratory failure with hypoxia: Secondary | ICD-10-CM | POA: Diagnosis not present

## 2021-09-17 DIAGNOSIS — Z765 Malingerer [conscious simulation]: Secondary | ICD-10-CM

## 2021-09-17 DIAGNOSIS — Z66 Do not resuscitate: Secondary | ICD-10-CM | POA: Diagnosis not present

## 2021-09-17 DIAGNOSIS — Z79899 Other long term (current) drug therapy: Secondary | ICD-10-CM | POA: Diagnosis not present

## 2021-09-17 DIAGNOSIS — I252 Old myocardial infarction: Secondary | ICD-10-CM

## 2021-09-17 DIAGNOSIS — R627 Adult failure to thrive: Secondary | ICD-10-CM | POA: Diagnosis not present

## 2021-09-17 DIAGNOSIS — C787 Secondary malignant neoplasm of liver and intrahepatic bile duct: Secondary | ICD-10-CM | POA: Diagnosis not present

## 2021-09-17 DIAGNOSIS — I469 Cardiac arrest, cause unspecified: Secondary | ICD-10-CM | POA: Diagnosis not present

## 2021-09-17 DIAGNOSIS — I2510 Atherosclerotic heart disease of native coronary artery without angina pectoris: Secondary | ICD-10-CM | POA: Diagnosis not present

## 2021-09-17 DIAGNOSIS — M62838 Other muscle spasm: Secondary | ICD-10-CM | POA: Diagnosis not present

## 2021-09-17 DIAGNOSIS — G9341 Metabolic encephalopathy: Secondary | ICD-10-CM | POA: Diagnosis not present

## 2021-09-17 DIAGNOSIS — Z9221 Personal history of antineoplastic chemotherapy: Secondary | ICD-10-CM

## 2021-09-17 DIAGNOSIS — A419 Sepsis, unspecified organism: Secondary | ICD-10-CM | POA: Diagnosis not present

## 2021-09-17 DIAGNOSIS — G2581 Restless legs syndrome: Secondary | ICD-10-CM | POA: Diagnosis present

## 2021-09-17 DIAGNOSIS — M48061 Spinal stenosis, lumbar region without neurogenic claudication: Principal | ICD-10-CM | POA: Diagnosis present

## 2021-09-17 DIAGNOSIS — M5126 Other intervertebral disc displacement, lumbar region: Secondary | ICD-10-CM

## 2021-09-17 DIAGNOSIS — J189 Pneumonia, unspecified organism: Secondary | ICD-10-CM | POA: Diagnosis not present

## 2021-09-17 DIAGNOSIS — R54 Age-related physical debility: Secondary | ICD-10-CM | POA: Diagnosis present

## 2021-09-17 DIAGNOSIS — C7951 Secondary malignant neoplasm of bone: Secondary | ICD-10-CM | POA: Diagnosis present

## 2021-09-17 DIAGNOSIS — G893 Neoplasm related pain (acute) (chronic): Secondary | ICD-10-CM | POA: Diagnosis present

## 2021-09-17 DIAGNOSIS — C7931 Secondary malignant neoplasm of brain: Secondary | ICD-10-CM | POA: Diagnosis present

## 2021-09-17 DIAGNOSIS — G8918 Other acute postprocedural pain: Secondary | ICD-10-CM | POA: Diagnosis not present

## 2021-09-17 DIAGNOSIS — I81 Portal vein thrombosis: Secondary | ICD-10-CM | POA: Diagnosis present

## 2021-09-17 DIAGNOSIS — E874 Mixed disorder of acid-base balance: Secondary | ICD-10-CM | POA: Diagnosis not present

## 2021-09-17 DIAGNOSIS — Z7984 Long term (current) use of oral hypoglycemic drugs: Secondary | ICD-10-CM

## 2021-09-17 DIAGNOSIS — R197 Diarrhea, unspecified: Secondary | ICD-10-CM | POA: Diagnosis not present

## 2021-09-17 DIAGNOSIS — R1011 Right upper quadrant pain: Secondary | ICD-10-CM | POA: Diagnosis not present

## 2021-09-17 DIAGNOSIS — Z7901 Long term (current) use of anticoagulants: Secondary | ICD-10-CM

## 2021-09-17 DIAGNOSIS — E119 Type 2 diabetes mellitus without complications: Secondary | ICD-10-CM

## 2021-09-17 DIAGNOSIS — K3184 Gastroparesis: Secondary | ICD-10-CM | POA: Diagnosis present

## 2021-09-17 DIAGNOSIS — J9602 Acute respiratory failure with hypercapnia: Secondary | ICD-10-CM | POA: Diagnosis not present

## 2021-09-17 DIAGNOSIS — Z888 Allergy status to other drugs, medicaments and biological substances status: Secondary | ICD-10-CM

## 2021-09-17 DIAGNOSIS — E43 Unspecified severe protein-calorie malnutrition: Secondary | ICD-10-CM | POA: Diagnosis not present

## 2021-09-17 DIAGNOSIS — Z9911 Dependence on respirator [ventilator] status: Secondary | ICD-10-CM

## 2021-09-17 DIAGNOSIS — R000 Tachycardia, unspecified: Secondary | ICD-10-CM | POA: Diagnosis not present

## 2021-09-17 DIAGNOSIS — F17210 Nicotine dependence, cigarettes, uncomplicated: Secondary | ICD-10-CM | POA: Diagnosis present

## 2021-09-17 DIAGNOSIS — E785 Hyperlipidemia, unspecified: Secondary | ICD-10-CM | POA: Diagnosis present

## 2021-09-17 DIAGNOSIS — F32A Depression, unspecified: Secondary | ICD-10-CM | POA: Diagnosis present

## 2021-09-17 DIAGNOSIS — E1143 Type 2 diabetes mellitus with diabetic autonomic (poly)neuropathy: Secondary | ICD-10-CM | POA: Diagnosis present

## 2021-09-17 DIAGNOSIS — K219 Gastro-esophageal reflux disease without esophagitis: Secondary | ICD-10-CM | POA: Diagnosis present

## 2021-09-17 DIAGNOSIS — Z7951 Long term (current) use of inhaled steroids: Secondary | ICD-10-CM

## 2021-09-17 DIAGNOSIS — R031 Nonspecific low blood-pressure reading: Secondary | ICD-10-CM | POA: Diagnosis not present

## 2021-09-17 DIAGNOSIS — R630 Anorexia: Secondary | ICD-10-CM | POA: Diagnosis not present

## 2021-09-17 DIAGNOSIS — Z7982 Long term (current) use of aspirin: Secondary | ICD-10-CM

## 2021-09-17 HISTORY — DX: Secondary malignant neoplasm of bone: C79.51

## 2021-09-17 HISTORY — PX: LUMBAR LAMINECTOMY/DECOMPRESSION MICRODISCECTOMY: SHX5026

## 2021-09-17 HISTORY — DX: Secondary malignant neoplasm of liver and intrahepatic bile duct: C78.7

## 2021-09-17 HISTORY — DX: Malignant neoplasm of unspecified part of unspecified bronchus or lung: C34.90

## 2021-09-17 HISTORY — DX: Portal vein thrombosis: I81

## 2021-09-17 LAB — GLUCOSE, CAPILLARY
Glucose-Capillary: 104 mg/dL — ABNORMAL HIGH (ref 70–99)
Glucose-Capillary: 105 mg/dL — ABNORMAL HIGH (ref 70–99)
Glucose-Capillary: 123 mg/dL — ABNORMAL HIGH (ref 70–99)

## 2021-09-17 LAB — SURGICAL PCR SCREEN
MRSA, PCR: NEGATIVE
Staphylococcus aureus: POSITIVE — AB

## 2021-09-17 SURGERY — LUMBAR LAMINECTOMY/DECOMPRESSION MICRODISCECTOMY 2 LEVELS
Anesthesia: General | Site: Spine Lumbar | Laterality: Right

## 2021-09-17 MED ORDER — PANTOPRAZOLE SODIUM 40 MG PO TBEC
40.0000 mg | DELAYED_RELEASE_TABLET | Freq: Every morning | ORAL | Status: DC
  Administered 2021-09-18 – 2021-09-21 (×4): 40 mg via ORAL
  Filled 2021-09-17 (×6): qty 1

## 2021-09-17 MED ORDER — THROMBIN (RECOMBINANT) 5000 UNITS EX SOLR
CUTANEOUS | Status: DC | PRN
  Administered 2021-09-17: 10 mL via TOPICAL

## 2021-09-17 MED ORDER — OXYCODONE HCL 5 MG PO TABS: 5.0000 mg | ORAL_TABLET | ORAL | Status: DC | PRN

## 2021-09-17 MED ORDER — HYDROMORPHONE HCL 1 MG/ML IJ SOLN
INTRAMUSCULAR | Status: AC
  Filled 2021-09-17: qty 0.5

## 2021-09-17 MED ORDER — LIDOCAINE-EPINEPHRINE 0.5 %-1:200000 IJ SOLN
INTRAMUSCULAR | Status: DC | PRN
  Administered 2021-09-17: 7 mL

## 2021-09-17 MED ORDER — OXYCODONE HCL 5 MG PO TABS
10.0000 mg | ORAL_TABLET | ORAL | Status: DC | PRN
  Administered 2021-09-18 – 2021-09-19 (×6): 10 mg via ORAL
  Filled 2021-09-17 (×6): qty 2

## 2021-09-17 MED ORDER — FENTANYL CITRATE (PF) 100 MCG/2ML IJ SOLN
50.0000 ug | Freq: Once | INTRAMUSCULAR | Status: AC
  Administered 2021-09-17: 50 ug via INTRAVENOUS
  Filled 2021-09-17: qty 2

## 2021-09-17 MED ORDER — PHENOL 1.4 % MT LIQD: 1.0000 | OROMUCOSAL | Status: DC | PRN

## 2021-09-17 MED ORDER — DOCUSATE SODIUM 100 MG PO CAPS
100.0000 mg | ORAL_CAPSULE | Freq: Two times a day (BID) | ORAL | Status: DC
Start: 2021-09-17 — End: 2021-09-22
  Administered 2021-09-17 – 2021-09-22 (×8): 100 mg via ORAL
  Filled 2021-09-17 (×11): qty 1

## 2021-09-17 MED ORDER — NALOXONE HCL 4 MG/0.1ML NA LIQD
1.0000 | NASAL | Status: DC | PRN
Start: 2021-09-17 — End: 2021-09-17

## 2021-09-17 MED ORDER — ACETAMINOPHEN 650 MG RE SUPP: 650.0000 mg | RECTAL | Status: DC | PRN

## 2021-09-17 MED ORDER — ONDANSETRON HCL 4 MG/2ML IJ SOLN
INTRAMUSCULAR | Status: AC
  Filled 2021-09-17: qty 2

## 2021-09-17 MED ORDER — PROPOFOL 10 MG/ML IV BOLUS
INTRAVENOUS | Status: DC | PRN
  Administered 2021-09-17: 20 mg via INTRAVENOUS
  Administered 2021-09-17: 30 mg via INTRAVENOUS
  Administered 2021-09-17: 50 mg via INTRAVENOUS

## 2021-09-17 MED ORDER — ACETAMINOPHEN 325 MG PO TABS
650.0000 mg | ORAL_TABLET | ORAL | Status: DC | PRN
  Administered 2021-09-18 – 2021-09-21 (×5): 650 mg via ORAL
  Filled 2021-09-17 (×6): qty 2

## 2021-09-17 MED ORDER — ONDANSETRON HCL 4 MG/2ML IJ SOLN
4.0000 mg | Freq: Four times a day (QID) | INTRAMUSCULAR | Status: DC | PRN
  Administered 2021-09-19: 4 mg via INTRAVENOUS
  Filled 2021-09-17: qty 2

## 2021-09-17 MED ORDER — LORAZEPAM 1 MG PO TABS
1.0000 mg | ORAL_TABLET | Freq: Three times a day (TID) | ORAL | Status: DC | PRN
Start: 2021-09-17 — End: 2021-09-22
  Administered 2021-09-17 – 2021-09-22 (×10): 1 mg via ORAL
  Filled 2021-09-17 (×10): qty 1

## 2021-09-17 MED ORDER — FENTANYL CITRATE (PF) 250 MCG/5ML IJ SOLN
INTRAMUSCULAR | Status: AC
  Filled 2021-09-17: qty 5

## 2021-09-17 MED ORDER — CEFAZOLIN SODIUM-DEXTROSE 2-4 GM/100ML-% IV SOLN
2.0000 g | INTRAVENOUS | Status: AC
  Administered 2021-09-17: 2 g via INTRAVENOUS
  Filled 2021-09-17: qty 100

## 2021-09-17 MED ORDER — MIDAZOLAM HCL 2 MG/2ML IJ SOLN
INTRAMUSCULAR | Status: DC | PRN
  Administered 2021-09-17: 2 mg via INTRAVENOUS

## 2021-09-17 MED ORDER — OXYCODONE HCL ER 10 MG PO T12A
20.0000 mg | EXTENDED_RELEASE_TABLET | Freq: Two times a day (BID) | ORAL | Status: DC
  Administered 2021-09-17 – 2021-09-19 (×4): 20 mg via ORAL
  Filled 2021-09-17 (×4): qty 2

## 2021-09-17 MED ORDER — SENNOSIDES-DOCUSATE SODIUM 8.6-50 MG PO TABS
2.0000 | ORAL_TABLET | Freq: Two times a day (BID) | ORAL | Status: DC | PRN
  Administered 2021-09-20: 2 via ORAL
  Filled 2021-09-17: qty 2

## 2021-09-17 MED ORDER — HEPARIN SODIUM (PORCINE) 5000 UNIT/ML IJ SOLN
5000.0000 [IU] | Freq: Three times a day (TID) | INTRAMUSCULAR | Status: DC
  Administered 2021-09-18 – 2021-09-22 (×12): 5000 [IU] via SUBCUTANEOUS
  Filled 2021-09-17 (×13): qty 1

## 2021-09-17 MED ORDER — CHLORHEXIDINE GLUCONATE CLOTH 2 % EX PADS: 6.0000 | MEDICATED_PAD | Freq: Once | CUTANEOUS | Status: DC

## 2021-09-17 MED ORDER — ALBUTEROL SULFATE HFA 108 (90 BASE) MCG/ACT IN AERS: 2.0000 | INHALATION_SPRAY | RESPIRATORY_TRACT | Status: DC | PRN

## 2021-09-17 MED ORDER — ALBUMIN HUMAN 5 % IV SOLN: INTRAVENOUS | Status: DC | PRN

## 2021-09-17 MED ORDER — METFORMIN HCL 500 MG PO TABS
500.0000 mg | ORAL_TABLET | Freq: Every day | ORAL | Status: DC
Start: 2021-09-18 — End: 2021-09-22
  Administered 2021-09-18 – 2021-09-21 (×4): 500 mg via ORAL
  Filled 2021-09-17 (×6): qty 1

## 2021-09-17 MED ORDER — CHLORHEXIDINE GLUCONATE 0.12 % MT SOLN
OROMUCOSAL | Status: AC
  Administered 2021-09-17: 15 mL via OROMUCOSAL
  Filled 2021-09-17: qty 15

## 2021-09-17 MED ORDER — MENTHOL 3 MG MT LOZG: 1.0000 | LOZENGE | OROMUCOSAL | Status: DC | PRN

## 2021-09-17 MED ORDER — ALBUTEROL SULFATE (2.5 MG/3ML) 0.083% IN NEBU
2.5000 mg | INHALATION_SOLUTION | RESPIRATORY_TRACT | Status: DC | PRN
  Administered 2021-09-21 – 2021-09-22 (×2): 2.5 mg via RESPIRATORY_TRACT
  Filled 2021-09-17 (×3): qty 3

## 2021-09-17 MED ORDER — SODIUM CHLORIDE 0.9 % IV SOLN: 250.0000 mL | INTRAVENOUS | Status: DC

## 2021-09-17 MED ORDER — SUGAMMADEX SODIUM 200 MG/2ML IV SOLN
INTRAVENOUS | Status: DC | PRN
  Administered 2021-09-17: 150 mg via INTRAVENOUS
  Administered 2021-09-17: 50 mg via INTRAVENOUS

## 2021-09-17 MED ORDER — ADULT MULTIVITAMIN W/MINERALS CH
1.0000 | ORAL_TABLET | Freq: Every day | ORAL | Status: DC
  Administered 2021-09-18 – 2021-09-22 (×4): 1 via ORAL
  Filled 2021-09-17 (×6): qty 1

## 2021-09-17 MED ORDER — BACITRACIN ZINC 500 UNIT/GM EX OINT
TOPICAL_OINTMENT | CUTANEOUS | Status: AC
  Filled 2021-09-17: qty 28.35

## 2021-09-17 MED ORDER — METHYLPREDNISOLONE ACETATE 80 MG/ML IJ SUSP
INTRAMUSCULAR | Status: DC | PRN
  Administered 2021-09-17: 80 mg

## 2021-09-17 MED ORDER — HYDROMORPHONE HCL 2 MG PO TABS
4.0000 mg | ORAL_TABLET | ORAL | Status: DC | PRN
  Filled 2021-09-17 (×2): qty 2

## 2021-09-17 MED ORDER — AMISULPRIDE (ANTIEMETIC) 5 MG/2ML IV SOLN: 10.0000 mg | Freq: Once | INTRAVENOUS | Status: DC | PRN

## 2021-09-17 MED ORDER — ROCURONIUM BROMIDE 10 MG/ML (PF) SYRINGE
PREFILLED_SYRINGE | INTRAVENOUS | Status: DC | PRN
  Administered 2021-09-17: 40 mg via INTRAVENOUS
  Administered 2021-09-17 (×2): 20 mg via INTRAVENOUS

## 2021-09-17 MED ORDER — PHENYLEPHRINE 80 MCG/ML (10ML) SYRINGE FOR IV PUSH (FOR BLOOD PRESSURE SUPPORT)
PREFILLED_SYRINGE | INTRAVENOUS | Status: DC | PRN
  Administered 2021-09-17 (×2): 80 ug via INTRAVENOUS

## 2021-09-17 MED ORDER — ADHERUS DURAL SEALANT
PACK | TOPICAL | Status: DC | PRN
  Administered 2021-09-17: 1 via TOPICAL

## 2021-09-17 MED ORDER — DRONABINOL 2.5 MG PO CAPS
5.0000 mg | ORAL_CAPSULE | Freq: Two times a day (BID) | ORAL | Status: DC
  Administered 2021-09-18 – 2021-09-21 (×8): 5 mg via ORAL
  Filled 2021-09-17 (×8): qty 2

## 2021-09-17 MED ORDER — MONTELUKAST SODIUM 10 MG PO TABS
10.0000 mg | ORAL_TABLET | Freq: Every day | ORAL | Status: DC
  Administered 2021-09-17 – 2021-09-21 (×5): 10 mg via ORAL
  Filled 2021-09-17 (×5): qty 1

## 2021-09-17 MED ORDER — DIAZEPAM 5 MG PO TABS
5.0000 mg | ORAL_TABLET | Freq: Four times a day (QID) | ORAL | Status: DC | PRN
  Administered 2021-09-17 – 2021-09-18 (×3): 5 mg via ORAL
  Filled 2021-09-17 (×4): qty 1

## 2021-09-17 MED ORDER — KETAMINE HCL 50 MG/5ML IJ SOSY
PREFILLED_SYRINGE | INTRAMUSCULAR | Status: AC
Start: 2021-09-17 — End: ?
  Filled 2021-09-17: qty 5

## 2021-09-17 MED ORDER — ONDANSETRON HCL 4 MG PO TABS: 4.0000 mg | ORAL_TABLET | Freq: Four times a day (QID) | ORAL | Status: DC | PRN

## 2021-09-17 MED ORDER — LACTATED RINGERS IV SOLN
INTRAVENOUS | Status: DC
Start: 2021-09-17 — End: 2021-09-17

## 2021-09-17 MED ORDER — LIDOCAINE 2% (20 MG/ML) 5 ML SYRINGE
INTRAMUSCULAR | Status: DC | PRN
  Administered 2021-09-17: 60 mg via INTRAVENOUS

## 2021-09-17 MED ORDER — FENTANYL CITRATE (PF) 100 MCG/2ML IJ SOLN
INTRAMUSCULAR | Status: DC | PRN
  Administered 2021-09-17: 100 ug via INTRAVENOUS

## 2021-09-17 MED ORDER — LIDOCAINE 2% (20 MG/ML) 5 ML SYRINGE
INTRAMUSCULAR | Status: AC
  Filled 2021-09-17: qty 5

## 2021-09-17 MED ORDER — MEPERIDINE HCL 25 MG/ML IJ SOLN: 6.2500 mg | INTRAMUSCULAR | Status: DC | PRN

## 2021-09-17 MED ORDER — PHENYLEPHRINE 80 MCG/ML (10ML) SYRINGE FOR IV PUSH (FOR BLOOD PRESSURE SUPPORT)
PREFILLED_SYRINGE | INTRAVENOUS | Status: AC
  Filled 2021-09-17: qty 10

## 2021-09-17 MED ORDER — DEXAMETHASONE SODIUM PHOSPHATE 10 MG/ML IJ SOLN
INTRAMUSCULAR | Status: AC
  Filled 2021-09-17: qty 1

## 2021-09-17 MED ORDER — CHLORHEXIDINE GLUCONATE 0.12 % MT SOLN: 15.0000 mL | Freq: Once | OROMUCOSAL | Status: AC

## 2021-09-17 MED ORDER — FENTANYL CITRATE (PF) 100 MCG/2ML IJ SOLN: 50.0000 ug | Freq: Once | INTRAMUSCULAR | Status: DC

## 2021-09-17 MED ORDER — ZOLPIDEM TARTRATE 5 MG PO TABS
5.0000 mg | ORAL_TABLET | Freq: Every evening | ORAL | Status: DC | PRN
  Administered 2021-09-17 – 2021-09-21 (×3): 5 mg via ORAL
  Filled 2021-09-17 (×3): qty 1

## 2021-09-17 MED ORDER — KETAMINE HCL 10 MG/ML IJ SOLN
INTRAMUSCULAR | Status: DC | PRN
  Administered 2021-09-17: 30 mg via INTRAVENOUS
  Administered 2021-09-17: 20 mg via INTRAVENOUS

## 2021-09-17 MED ORDER — MULTIVITAMINS PO CAPS: 1.0000 | ORAL_CAPSULE | Freq: Every morning | ORAL | Status: DC

## 2021-09-17 MED ORDER — LIDOCAINE-EPINEPHRINE 0.5 %-1:200000 IJ SOLN
INTRAMUSCULAR | Status: AC
Start: 2021-09-17 — End: ?
  Filled 2021-09-17: qty 1

## 2021-09-17 MED ORDER — SODIUM CHLORIDE 0.9% FLUSH: 3.0000 mL | INTRAVENOUS | Status: DC | PRN

## 2021-09-17 MED ORDER — FENTANYL CITRATE PF 50 MCG/ML IJ SOSY: 50.0000 ug | PREFILLED_SYRINGE | Freq: Once | INTRAMUSCULAR | Status: DC

## 2021-09-17 MED ORDER — ESCITALOPRAM OXALATE 10 MG PO TABS
5.0000 mg | ORAL_TABLET | Freq: Every morning | ORAL | Status: DC
  Administered 2021-09-18 – 2021-09-22 (×5): 5 mg via ORAL
  Filled 2021-09-17 (×5): qty 1

## 2021-09-17 MED ORDER — DEXAMETHASONE SODIUM PHOSPHATE 10 MG/ML IJ SOLN
INTRAMUSCULAR | Status: DC | PRN
  Administered 2021-09-17: 10 mg via INTRAVENOUS

## 2021-09-17 MED ORDER — 0.9 % SODIUM CHLORIDE (POUR BTL) OPTIME
TOPICAL | Status: DC | PRN
  Administered 2021-09-17: 1000 mL

## 2021-09-17 MED ORDER — SODIUM CHLORIDE 0.9% FLUSH
3.0000 mL | Freq: Two times a day (BID) | INTRAVENOUS | Status: DC
  Administered 2021-09-17 – 2021-09-19 (×5): 3 mL via INTRAVENOUS

## 2021-09-17 MED ORDER — THROMBIN 5000 UNITS EX SOLR
CUTANEOUS | Status: AC
  Filled 2021-09-17: qty 10000

## 2021-09-17 MED ORDER — ASPIRIN 81 MG PO TBEC
81.0000 mg | DELAYED_RELEASE_TABLET | Freq: Every morning | ORAL | Status: DC
  Administered 2021-09-18 – 2021-09-22 (×5): 81 mg via ORAL
  Filled 2021-09-17 (×5): qty 1

## 2021-09-17 MED ORDER — HYDROMORPHONE HCL 1 MG/ML IJ SOLN: 0.2500 mg | INTRAMUSCULAR | Status: DC | PRN

## 2021-09-17 MED ORDER — KETOROLAC TROMETHAMINE 10 MG PO TABS
10.0000 mg | ORAL_TABLET | Freq: Four times a day (QID) | ORAL | Status: DC | PRN
  Administered 2021-09-18: 10 mg via ORAL
  Filled 2021-09-17 (×2): qty 1

## 2021-09-17 MED ORDER — ROCURONIUM BROMIDE 10 MG/ML (PF) SYRINGE
PREFILLED_SYRINGE | INTRAVENOUS | Status: AC
  Filled 2021-09-17: qty 10

## 2021-09-17 MED ORDER — INSULIN ASPART 100 UNIT/ML IJ SOLN: 0.0000 [IU] | INTRAMUSCULAR | Status: DC | PRN

## 2021-09-17 MED ORDER — MIDAZOLAM HCL 2 MG/2ML IJ SOLN
INTRAMUSCULAR | Status: AC
Start: 2021-09-17 — End: ?
  Filled 2021-09-17: qty 2

## 2021-09-17 MED ORDER — FENTANYL CITRATE (PF) 100 MCG/2ML IJ SOLN
50.0000 ug | Freq: Once | INTRAMUSCULAR | Status: AC
  Administered 2021-09-17: 50 ug via INTRAVENOUS

## 2021-09-17 MED ORDER — HYDROMORPHONE HCL 1 MG/ML IJ SOLN
INTRAMUSCULAR | Status: DC | PRN
  Administered 2021-09-17 (×3): .5 mg via INTRAVENOUS

## 2021-09-17 MED ORDER — MOMETASONE FURO-FORMOTEROL FUM 200-5 MCG/ACT IN AERO
2.0000 | INHALATION_SPRAY | Freq: Two times a day (BID) | RESPIRATORY_TRACT | Status: DC
  Administered 2021-09-18 – 2021-09-22 (×7): 2 via RESPIRATORY_TRACT
  Filled 2021-09-17: qty 8.8

## 2021-09-17 MED ORDER — HYDROMORPHONE HCL 1 MG/ML IJ SOLN
1.0000 mg | INTRAMUSCULAR | Status: DC | PRN
  Administered 2021-09-17 – 2021-09-19 (×7): 1 mg via INTRAVENOUS
  Filled 2021-09-17 (×7): qty 1

## 2021-09-17 MED ORDER — FENTANYL CITRATE (PF) 100 MCG/2ML IJ SOLN
INTRAMUSCULAR | Status: AC
  Administered 2021-09-17: 50 ug
  Filled 2021-09-17: qty 2

## 2021-09-17 MED ORDER — FENTANYL CITRATE (PF) 250 MCG/5ML IJ SOLN
INTRAMUSCULAR | Status: DC | PRN
  Administered 2021-09-17: 100 ug via INTRAVENOUS
  Administered 2021-09-17: 50 ug via INTRAVENOUS
  Administered 2021-09-17: 100 ug via INTRAVENOUS

## 2021-09-17 MED ORDER — POTASSIUM CHLORIDE IN NACL 20-0.9 MEQ/L-% IV SOLN
INTRAVENOUS | Status: DC
  Filled 2021-09-17 (×4): qty 1000

## 2021-09-17 MED ORDER — KETOROLAC TROMETHAMINE 15 MG/ML IJ SOLN
15.0000 mg | Freq: Four times a day (QID) | INTRAMUSCULAR | Status: AC
  Administered 2021-09-18 (×4): 15 mg via INTRAVENOUS
  Filled 2021-09-17 (×4): qty 1

## 2021-09-17 MED ORDER — NALOXONE HCL 0.4 MG/ML IJ SOLN
0.4000 mg | INTRAMUSCULAR | Status: DC | PRN
Start: 2021-09-17 — End: 2021-09-22

## 2021-09-17 MED ORDER — ORAL CARE MOUTH RINSE: 15.0000 mL | Freq: Once | OROMUCOSAL | Status: AC

## 2021-09-17 MED ORDER — LORATADINE 10 MG PO TABS
10.0000 mg | ORAL_TABLET | Freq: Every day | ORAL | Status: DC
  Administered 2021-09-17 – 2021-09-19 (×3): 10 mg via ORAL
  Filled 2021-09-17 (×5): qty 1

## 2021-09-17 SURGICAL SUPPLY — 55 items
BAG COUNTER SPONGE SURGICOUNT (BAG) ×2 IMPLANT
BAND RUBBER #18 3X1/16 STRL (MISCELLANEOUS) ×4 IMPLANT
BENZOIN TINCTURE PRP APPL 2/3 (GAUZE/BANDAGES/DRESSINGS) IMPLANT
BLADE CLIPPER SURG (BLADE) IMPLANT
BUR MATCHSTICK NEURO 3.0 LAGG (BURR) ×2 IMPLANT
BUR PRECISION FLUTE 5.0 (BURR) ×1 IMPLANT
CANISTER SUCT 3000ML PPV (MISCELLANEOUS) ×2 IMPLANT
CARTRIDGE OIL MAESTRO DRILL (MISCELLANEOUS) ×1 IMPLANT
DERMABOND ADVANCED (GAUZE/BANDAGES/DRESSINGS) ×1
DERMABOND ADVANCED .7 DNX12 (GAUZE/BANDAGES/DRESSINGS) ×1 IMPLANT
DIFFUSER DRILL AIR PNEUMATIC (MISCELLANEOUS) ×2 IMPLANT
DRAPE LAPAROTOMY 100X72X124 (DRAPES) ×2 IMPLANT
DRAPE MICROSCOPE LEICA (MISCELLANEOUS) ×2 IMPLANT
DRAPE SURG 17X23 STRL (DRAPES) ×2 IMPLANT
DRSG OPSITE POSTOP 4X6 (GAUZE/BANDAGES/DRESSINGS) ×1 IMPLANT
DURAPREP 26ML APPLICATOR (WOUND CARE) ×2 IMPLANT
ELECT REM PT RETURN 9FT ADLT (ELECTROSURGICAL) ×2
ELECTRODE REM PT RTRN 9FT ADLT (ELECTROSURGICAL) ×1 IMPLANT
GAUZE 4X4 16PLY ~~LOC~~+RFID DBL (SPONGE) IMPLANT
GAUZE SPONGE 4X4 12PLY STRL (GAUZE/BANDAGES/DRESSINGS) IMPLANT
GLOVE BIO SURGEON STRL SZ7 (GLOVE) ×4 IMPLANT
GLOVE ECLIPSE 6.5 STRL STRAW (GLOVE) ×2 IMPLANT
GLOVE EXAM NITRILE XL STR (GLOVE) IMPLANT
GOWN STRL REUS W/ TWL LRG LVL3 (GOWN DISPOSABLE) ×2 IMPLANT
GOWN STRL REUS W/ TWL XL LVL3 (GOWN DISPOSABLE) IMPLANT
GOWN STRL REUS W/TWL 2XL LVL3 (GOWN DISPOSABLE) IMPLANT
GOWN STRL REUS W/TWL LRG LVL3 (GOWN DISPOSABLE) ×2
GOWN STRL REUS W/TWL XL LVL3 (GOWN DISPOSABLE) ×1
GRAFT DURAGEN MATRIX 1WX1L (Tissue) ×1 IMPLANT
KIT BASIN OR (CUSTOM PROCEDURE TRAY) ×2 IMPLANT
KIT TURNOVER KIT B (KITS) ×2 IMPLANT
NDL HYPO 18GX1.5 BLUNT FILL (NEEDLE) IMPLANT
NDL HYPO 25X1 1.5 SAFETY (NEEDLE) ×1 IMPLANT
NDL SPNL 18GX3.5 QUINCKE PK (NEEDLE) IMPLANT
NEEDLE HYPO 18GX1.5 BLUNT FILL (NEEDLE) ×2 IMPLANT
NEEDLE HYPO 25X1 1.5 SAFETY (NEEDLE) ×2 IMPLANT
NEEDLE SPNL 18GX3.5 QUINCKE PK (NEEDLE) ×2 IMPLANT
NS IRRIG 1000ML POUR BTL (IV SOLUTION) ×2 IMPLANT
OIL CARTRIDGE MAESTRO DRILL (MISCELLANEOUS) ×2
PACK LAMINECTOMY NEURO (CUSTOM PROCEDURE TRAY) ×2 IMPLANT
PAD ARMBOARD 7.5X6 YLW CONV (MISCELLANEOUS) ×6 IMPLANT
SEALANT ADHERUS EXTEND TIP (MISCELLANEOUS) ×2 IMPLANT
SPIKE FLUID TRANSFER (MISCELLANEOUS) ×2 IMPLANT
SPONGE SURGIFOAM ABS GEL SZ50 (HEMOSTASIS) ×2 IMPLANT
SPONGE T-LAP 4X18 ~~LOC~~+RFID (SPONGE) IMPLANT
STRIP CLOSURE SKIN 1/2X4 (GAUZE/BANDAGES/DRESSINGS) IMPLANT
SUT ETHILON 3 0 FSL (SUTURE) ×1 IMPLANT
SUT VIC AB 0 CT1 18XCR BRD8 (SUTURE) ×1 IMPLANT
SUT VIC AB 0 CT1 8-18 (SUTURE) ×1
SUT VIC AB 2-0 CT1 18 (SUTURE) ×2 IMPLANT
SUT VIC AB 3-0 SH 8-18 (SUTURE) ×2 IMPLANT
SYR 3ML LL SCALE MARK (SYRINGE) ×1 IMPLANT
TOWEL GREEN STERILE (TOWEL DISPOSABLE) ×2 IMPLANT
TOWEL GREEN STERILE FF (TOWEL DISPOSABLE) ×2 IMPLANT
WATER STERILE IRR 1000ML POUR (IV SOLUTION) ×2 IMPLANT

## 2021-09-17 NOTE — Anesthesia Postprocedure Evaluation (Signed)
Anesthesia Post Note  Patient: Bill Garza  Procedure(s) Performed: Right Lumbar four-five Microdiscectomy, Right Lumbar three-four (Right: Spine Lumbar)     Patient location during evaluation: PACU Anesthesia Type: General Level of consciousness: awake and alert Pain management: pain level controlled Vital Signs Assessment: post-procedure vital signs reviewed and stable Respiratory status: spontaneous breathing, nonlabored ventilation, respiratory function stable and patient connected to nasal cannula oxygen Cardiovascular status: blood pressure returned to baseline and stable Postop Assessment: no apparent nausea or vomiting Anesthetic complications: no   No notable events documented.  Last Vitals:  Vitals:   08/19/2021 1945 08/27/2021 2005  BP: 137/76   Pulse: (!) 108   Resp: (!) 21   Temp:  36.7 C  SpO2: 95%     Last Pain:  Vitals:   09/08/2021 2153  TempSrc:   PainSc: 10-Worst pain ever                 March Rummage Amiayah Giebel

## 2021-09-17 NOTE — Anesthesia Procedure Notes (Addendum)
Arterial Line Insertion Start/End06/02/2022 4:35 PM, 09/03/2021 4:40 PM Performed by: Rande Brunt, CRNA  Patient location: OR. Preanesthetic checklist: patient identified, IV checked, site marked, risks and benefits discussed, surgical consent, monitors and equipment checked, pre-op evaluation, timeout performed and anesthesia consent Lidocaine 1% used for infiltration Right, radial was placed Catheter size: 20 G Hand hygiene performed  and maximum sterile barriers used   Attempts: 2 Procedure performed without using ultrasound guided technique. Following insertion, dressing applied and Biopatch. Post procedure assessment: normal and unchanged  Patient tolerated the procedure well with no immediate complications.

## 2021-09-17 NOTE — Op Note (Signed)
08/20/2021  7:44 PM  PATIENT:  Bill Garza  61 y.o. male  PRE-OPERATIVE DIAGNOSIS:  Disc displacement, Lumbar 3/4,4/5  POST-OPERATIVE DIAGNOSIS:  Disc displacement, Lumbar 3/4,4/5  PROCEDURE:  Procedure(s): Right Lumbar four-five Microdiscectomy, Right Lumbar three-four discetomy  SURGEON:   Surgeon(s): Ashok Pall, MD  ASSISTANTS:none  ANESTHESIA:   general  EBL:  Total I/O In: 500 [I.V.:500] Out: 30 [Blood:30]  BLOOD ADMINISTERED:none  COUNT:per nursing  DRAINS: none   SPECIMEN:  No Specimen  DICTATION: Mr. Dudding was taken to the operating room, intubated and placed under a general anesthetic without difficulty. He was positioned prone on a Wilson frame with all pressure points padded. HIs back was prepped and draped in a sterile manner. I opened the skin with a 10 blade and carried the dissection down to the thoracolumbar fascia. I used both sharp dissection and the monopolar cautery to expose the lamina of L3,4, and 5. I confirmed my location with an intraoperative xray.  I used the drill, Kerrison punches, and curettes to perform a semihemilaminectomy of L3, and 4. I used the punches to remove the ligamentum flavum to expose the thecal sac. I brought the microscope into the operative field and I started the decompression of the spinal canal, thecal sac and L4,l5 root(s). I cauterized epidural veins overlying the disc space then divided them sharply. I opened the disc spaces with a 15 blade and proceeded with the discectomies. I used pituitary rongeurs, curettes, and other instruments to remove disc material. Going midline at L3/4 the dura was breached. I placed duragen anterior to the sac. I placed a dural sealant over the site. After the discectomy was completed I inspected the  nerve roots and felt  they were well decompressed. I explored rostrally, laterally, medially, and caudally and was satisfied with the decompression. I irrigated the wound, then closed in layers. I  approximated the thoracolumbar fascia, subcutaneous, and subcuticular planes with vicryl sutures. I closed the skin with nylon.   PLAN OF CARE: Admit to inpatient   PATIENT DISPOSITION:  PACU - hemodynamically stable.   Delay start of Pharmacological VTE agent (>24hrs) due to surgical blood loss or risk of bleeding:  no

## 2021-09-17 NOTE — H&P (Signed)
Ht 6' (1.829 m)   Wt 72.1 kg   BMI 21.56 kg/m  BP 140/88   Pulse (!) 121   Temp 98.1 F (36.7 C) (Oral)   Resp 18   Ht 6' (1.829 m)   Wt 77.1 kg   SpO2 97%   BMI 23.06 kg/m      Bill Garza was admitted yet again to the hospital this past weekend.  Yet again, he had every single study redone:  CT of the lumbar spine, MRIs, thoracic, lumbar, and of the abdomen.  He does have new lesions in his spine, but he said he received an injection; that injection was done at 4-5, it was done on the right side, and his wife states he was pain free for 2 days.  Given that, at this point I am disregarding the findings.  He certainly has a disc over there, but again it never impressed me that this could be responsible for everything.  I will just simply take him to the operating room, look at 3-4, look at 4-5, do discectomies, and then be done and hopefully this will help him.  I would expect him to be in the hospital overnight.  I have explained this to both Bill Garza and his wife.  Bill Garza is quite debilitated.  Allergies  Allergen Reactions   Keppra [Levetiracetam] Other (See Comments)    Personality changes   Lyrica [Pregabalin] Swelling    Leg swelling    Neurontin [Gabapentin] Swelling    Leg swelling    Compazine [Prochlorperazine] Nausea And Vomiting   Duloxetine Other (See Comments)    Excessive sleeping   Other Other (See Comments)    Antibiotics - per wife at bedside, patient will develop C-diff if given "any antibiotic"   Wellbutrin [Bupropion] Nausea And Vomiting   Zetia [Ezetimibe] Other (See Comments)    Unknown reaction   Lamictal [Lamotrigine] Itching and Rash   Past Medical History:  Diagnosis Date   Chronic back pain    Chronic GERD    Chronic pain    COPD (chronic obstructive pulmonary disease) (HCC)    Coronary artery disease    Depression    Diabetes mellitus without complication (HCC)    Gastroparesis    Goals of care, counseling/discussion 01/27/2021   High risk  medication use    Hyperlipidemia    Lung cancer ([place])    Metastasis to liver [facility])    Metastatic cancer to spine ([place])    [place]) [place]) [place]   Non-small cell lung cancer metastatic to brain ([place]) 01/27/2021   Portal vein thrombosis    Restless leg syndrome    Past Surgical History:  Procedure Laterality Date   BACK SURGERY     CARDIAC CATHETERIZATION     IR FLUORO GUIDED NEEDLE PLC ASPIRATION/INJECTION LOC  08/30/2021   IR RADIOLOGIST EVAL & MGMT  09/13/2021   History reviewed. No pertinent family history. Social History   Socioeconomic History   Marital status: Married    Spouse name: Not on file   Number of children: Not on file   Years of education: Not on file   Highest education level: Not on file  Occupational History   Not on file  Tobacco Use   Smoking status: Some Days    Packs/day: 1.00    Years: 40.00    Total pack years: 40.00    Types: Cigarettes    Last attempt to quit: 07/20/2020  Years since quitting: 1.1   Smokeless tobacco: Never  Vaping Use   Vaping Use: Never used  Substance and Sexual Activity   Alcohol use: Not Currently   Drug use: Not Currently   Sexual activity: Not on file  Other Topics Concern   Not on file  Social History Narrative   Not on file   Social Determinants of Health   Financial Resource Strain: Not on file  Food Insecurity: Not on file  Transportation Needs: Not on file  Physical Activity: Not on file  Stress: Not on file  Social Connections: Not on file  Intimate Partner Violence: Not on file   Prior to Admission medications   Medication Sig Start Date End Date Taking? Authorizing Provider  acetaminophen (TYLENOL) 500 MG tablet Take 1,000 mg by mouth every 6 (six) hours as needed (pain).   Yes [provider]  albuterol (PROVENTIL) (2.5 MG/3ML) 0.083% nebulizer solution Take 2.5 mg by nebulization daily as needed for shortness of breath or wheezing. 01/25/21  Yes [provider]  albuterol (VENTOLIN HFA) 108 (90 Base) MCG/ACT inhaler Inhale 2 puffs into the lungs every 4 (four) hours as needed for wheezing or shortness of breath. 07/14/21  Yes Pickenpack-Cousar, Carlena Sax, NP  aspirin EC 81 MG tablet Take 81 mg by mouth in the morning.   Yes [provider]  budesonide-formoterol (SYMBICORT) 160-4.5 MCG/ACT inhaler Inhale 2 puffs into the lungs 2 (two) times daily. 07/14/21  Yes Pickenpack-Cousar, Carlena Sax, NP  cyclobenzaprine (FLEXERIL) 5 MG tablet Take 1 tablet (5 mg total) by mouth 3 (three) times daily as needed for muscle spasms. 09/03/21  Yes Regalado, Belkys A, MD  dronabinol (MARINOL) 5 MG capsule Take 1 capsule (5 mg total) by mouth 2 (two) times daily before lunch and supper. 09/03/21  Yes Regalado, Belkys A, MD  escitalopram (LEXAPRO) 5 MG tablet Take 5 mg by mouth in the morning.   Yes [provider]  HYDROmorphone (DILAUDID) 4 MG tablet Take 1 tablet (4 mg total) by mouth every 4 (four) hours as needed for severe pain. 09/14/21  Yes Ennever, Rudell Cobb, MD  ketorolac (TORADOL) 10 MG tablet Take 1 tablet (10 mg total) by mouth every 6 (six) hours as needed for moderate pain. 09/08/21  Yes Pickenpack-Cousar, Carlena Sax, NP  LORazepam (ATIVAN) 1 MG tablet Take 1 tablet (1 mg total) by mouth every 8 (eight) hours as needed for anxiety. 09/14/21  Yes [name], MD  metFORMIN (GLUCOPHAGE) 500 MG tablet Take 500 mg by mouth daily.   Yes [provider]  montelukast (SINGULAIR) 10 MG tablet Take 10 mg by mouth at bedtime.   Yes [provider]  Multiple Vitamin (MULTIVITAMIN) capsule Take 1 capsule by mouth in the morning.   Yes [provider]  naloxone (NARCAN) nasal spray 4 mg/0.1 mL Place 1 spray into the nose as needed (accidental overdose). 12/05/20  Yes [provider]  pantoprazole (PROTONIX) 40 MG tablet Take 40 mg by mouth in the morning.   Yes [provider]  Rivaroxaban (XARELTO) 15 MG  TABS tablet Take 1 tablet (15 mg total) by mouth 2 (two) times daily with a meal for 17 days. 09/03/21 09/20/21 Yes Regalado, Belkys A, MD  Blood Glucose Monitoring Suppl (ACCU-CHEK GUIDE) w/Device KIT USE AS DIRECTED TO CHECK BLOOD SUGAR Patient not taking: Reported on 07/26/2021 11/20/20   [provider]  Blood Glucose Monitoring Suppl (GLUCOCOM BLOOD GLUCOSE MONITOR) DEVI  11/20/20  [provider]  cetirizine (ZYRTEC) 10 MG tablet Take 20 mg by mouth daily as needed for allergies. 09/10/21   [provider]  ondansetron (ZOFRAN) 8 MG tablet Take 1 tablet (8 mg total) by mouth every 8 (eight) hours as needed for nausea or vomiting. 09/07/21   Pickenpack-Cousar, Carlena Sax, NP  Oxycodone HCl 10 MG TABS Take 1 tablet (10 mg total) by mouth every 6 (six) hours as needed. Patient not taking: Reported on 09/14/2021 09/08/21   Pickenpack-Cousar, Carlena Sax, NP  polyethylene glycol (MIRALAX / GLYCOLAX) 17 g packet Take 17 g by mouth daily. Patient not taking: Reported on 08/13/2021    [provider]  rivaroxaban (XARELTO) 20 MG TABS tablet Take 1 tablet (20 mg total) by mouth daily with supper. Patient not taking: Reported on 09/10/2021 09/21/21   Regalado, Jerald Kief A, MD  senna-docusate (SENOKOT-S) 8.6-50 MG tablet Take 2 tablets by mouth 2 (two) times daily. Patient taking differently: Take 2 tablets by mouth 2 (two) times daily as needed (constipation.). 07/26/21   [name], MD    He is cachectic, and his wife states he cannot eat because he is in pain.  Temperature is 99.1.  He is 6 feet, weight is 168 pounds.  Blood pressure is 103/71, pulse is 121, respiratory rate 20, pain is 8/10.  He is on Xarelto.  I told him to not take any Xarelto after Wednesday.  Again, plan to do this Friday.  Risks and benefits, bleeding, infection, no relief, need for further surgery, damage to the nerve roots, footdrop, weakness in the dorsiflexors or plantar flexors.  These were discussed.  He  understands and he wishes to proceed.

## 2021-09-17 NOTE — Anesthesia Procedure Notes (Addendum)
Procedure Name: Intubation Date/Time: 09/10/2021 4:33 PM  Performed by: Rande Brunt, CRNAPre-anesthesia Checklist: Patient identified, Emergency Drugs available, Suction available and Patient being monitored Patient Re-evaluated:Patient Re-evaluated prior to induction Oxygen Delivery Method: Circle System Utilized Preoxygenation: Pre-oxygenation with 100% oxygen Induction Type: IV induction Ventilation: Mask ventilation without difficulty and Oral airway inserted - appropriate to patient size Laryngoscope Size: Miller and 3 Grade View: Grade I Tube type: Oral Number of attempts: 1 Airway Equipment and Method: Stylet and Oral airway Placement Confirmation: ETT inserted through vocal cords under direct vision, positive ETCO2 and breath sounds checked- equal and bilateral Secured at: 21 cm Tube secured with: Tape Dental Injury: Teeth and Oropharynx as per pre-operative assessment  Comments: Placed by Dr. Lissa Hoard

## 2021-09-17 NOTE — Progress Notes (Signed)
Report attempted x2.  Will call back in 10 min.

## 2021-09-17 NOTE — Transfer of Care (Signed)
Immediate Anesthesia Transfer of Care Note  Patient: Bill Garza  Procedure(s) Performed: Right Lumbar four-five Microdiscectomy, Right Lumbar three-four (Right: Spine Lumbar)  Patient Location: PACU  Anesthesia Type:General  Level of Consciousness: drowsy, patient cooperative and responds to stimulation  Airway & Oxygen Therapy: Patient Spontanous Breathing  Post-op Assessment: Report given to RN, Post -op Vital signs reviewed and stable and Patient moving all extremities X 4  Post vital signs: Reviewed and stable  Last Vitals:  Vitals Value Taken Time  BP 137/69 09/01/2021 1918  Temp 36.2 C 09/03/2021 1912  Pulse 105 08/20/2021 1929  Resp 23 09/13/2021 1929  SpO2 95 % 09/15/2021 1929  Vitals shown include unvalidated device data.  Last Pain:  Vitals:   09/16/2021 1912  TempSrc:   PainSc: 0-No pain         Complications: No notable events documented.

## 2021-09-18 LAB — CBC
HCT: 27.8 % — ABNORMAL LOW (ref 39.0–52.0)
Hemoglobin: 9.3 g/dL — ABNORMAL LOW (ref 13.0–17.0)
MCH: 30.8 pg (ref 26.0–34.0)
MCHC: 33.5 g/dL (ref 30.0–36.0)
MCV: 92.1 fL (ref 80.0–100.0)
Platelets: 481 10*3/uL — ABNORMAL HIGH (ref 150–400)
RBC: 3.02 MIL/uL — ABNORMAL LOW (ref 4.22–5.81)
RDW: 17.7 % — ABNORMAL HIGH (ref 11.5–15.5)
WBC: 14.8 10*3/uL — ABNORMAL HIGH (ref 4.0–10.5)
nRBC: 0 % (ref 0.0–0.2)

## 2021-09-18 LAB — CREATININE, SERUM
Creatinine, Ser: 0.5 mg/dL — ABNORMAL LOW (ref 0.61–1.24)
GFR, Estimated: >60 mL/min (ref >60)

## 2021-09-18 MED ORDER — MUPIROCIN 2 % EX OINT
1.0000 | TOPICAL_OINTMENT | Freq: Two times a day (BID) | CUTANEOUS | Status: AC
  Administered 2021-09-18 – 2021-09-22 (×10): 1 via NASAL
  Filled 2021-09-18 (×2): qty 22

## 2021-09-18 MED ORDER — CHLORHEXIDINE GLUCONATE CLOTH 2 % EX PADS
6.0000 | MEDICATED_PAD | Freq: Every day | CUTANEOUS | Status: DC
  Administered 2021-09-18 – 2021-09-21 (×3): 6 via TOPICAL

## 2021-09-18 NOTE — Progress Notes (Signed)
Subjective: Patient reports persistent mid-back pain, asking for Dilaudid  Objective: Vital signs in last 24 hours: Temp:  [97.2 F (36.2 C)-98.1 F (36.7 C)] 97.6 F (36.4 C) (07/01 0700) Pulse Rate:  [93-129] 94 (07/01 0700) Resp:  [18-23] 19 (07/01 0700) BP: (117-144)/(69-109) 142/80 (07/01 0700) SpO2:  [90 %-97 %] 92 % (07/01 0833) Weight:  [77.1 kg] 77.1 kg (06/30 1351)  Intake/Output from previous day: 06/30 0701 - 07/01 0700 In: 2621.3 [I.V.:2271.3; IV Piggyback:350] Out: 930 [Urine:900; Blood:30] Intake/Output this shift: No intake/output data recorded.  Drowsy, gaunt, deconditioned Dressing c/d Generalized weakness limits strength examination  Lab Results: Recent Labs    09/18/21 0253  WBC 14.8*  HGB 9.3*  HCT 27.8*  PLT 481*   BMET Recent Labs    09/18/21 0253  CREATININE 0.50*    Studies/Results: DG Lumbar Spine 1 View  Result Date: 09/14/2021 CLINICAL DATA:  Portable lateral lumbar spine image for surgical localization. EXAM: LUMBAR SPINE - 1 VIEW COMPARISON:  MR lumbar spine, 08/25/2021. FINDINGS: Surgical probe has been inserted, tip projecting 2.5 cm posterior to the posterior upper endplate of L4. IMPRESSION: Surgical localization imaging as described. Electronically Signed   By: Lajean Manes M.D.   On: 08/23/2021 19:20    Assessment/Plan: 61 yo M with widespread metastatic lung cancer including numerous bony lesions s/p L3-4 and L4-5 microdiscectomies. He appears to have significant amount of cancer pain from his numerous bony lesions, unclear if this surgery has helped thus far. - likely can restart Xarelto tomorrow- he has portal vein thrombosis    Vallarie Mare 09/18/2021, 11:10 AM

## 2021-09-18 NOTE — Progress Notes (Signed)
  Transition of Care [facility]) Screening Note   Patient Details  Name: Bill Garza Date of Birth: [birth year]   Transition of Care [facility]) CM/SW Contact:    Alfredia Ferguson, LCSW Phone Number: 09/18/2021, 8:32 AM    Transition of Care Department [facility]) has reviewed patient and noted no immediate TOC needs pending continued medical work-up. TOC team will continue to monitor patient advancement through interdisciplinary progression rounds to support any identified discharge supports as needed. If new patient transition needs arise, please place a TOC consult or reach out to [facility] team.

## 2021-09-18 DEATH — deceased

## 2021-09-19 LAB — BASIC METABOLIC PANEL
Anion gap: 13 (ref 5–15)
BUN: 12 mg/dL (ref 6–20)
CO2: 15 mmol/L — ABNORMAL LOW (ref 22–32)
Calcium: 6.9 mg/dL — ABNORMAL LOW (ref 8.9–10.3)
Chloride: 104 mmol/L (ref 98–111)
Creatinine, Ser: 0.57 mg/dL — ABNORMAL LOW (ref 0.61–1.24)
GFR, Estimated: >60 mL/min (ref >60)
Glucose, Bld: 128 mg/dL — ABNORMAL HIGH (ref 70–99)
Potassium: 4.9 mmol/L (ref 3.5–5.1)
Sodium: 132 mmol/L — ABNORMAL LOW (ref 135–145)

## 2021-09-19 MED ORDER — HYDROMORPHONE HCL 1 MG/ML IJ SOLN
0.5000 mg | INTRAMUSCULAR | Status: DC | PRN
  Administered 2021-09-19 – 2021-09-20 (×5): 0.5 mg via INTRAVENOUS
  Filled 2021-09-19 (×5): qty 0.5

## 2021-09-19 MED ORDER — KETOROLAC TROMETHAMINE 10 MG PO TABS
10.0000 mg | ORAL_TABLET | Freq: Four times a day (QID) | ORAL | Status: DC | PRN
Start: 2021-09-19 — End: 2021-09-22
  Administered 2021-09-19 – 2021-09-21 (×5): 10 mg via ORAL
  Filled 2021-09-19 (×8): qty 1

## 2021-09-19 MED ORDER — HYDROMORPHONE HCL 1 MG/ML IJ SOLN: 0.5000 mg | INTRAMUSCULAR | Status: DC | PRN

## 2021-09-19 MED ORDER — OXYCODONE-ACETAMINOPHEN 5-325 MG PO TABS
2.0000 | ORAL_TABLET | ORAL | Status: DC | PRN
  Administered 2021-09-19 – 2021-09-21 (×8): 2 via ORAL
  Filled 2021-09-19 (×9): qty 2

## 2021-09-19 MED ORDER — CYCLOBENZAPRINE HCL 10 MG PO TABS
5.0000 mg | ORAL_TABLET | Freq: Three times a day (TID) | ORAL | Status: DC | PRN
Start: 2021-09-19 — End: 2021-09-22
  Administered 2021-09-19 – 2021-09-22 (×6): 5 mg via ORAL
  Filled 2021-09-19 (×7): qty 1

## 2021-09-19 MED ORDER — SODIUM CHLORIDE 0.9 % IV SOLN: INTRAVENOUS | Status: DC

## 2021-09-19 NOTE — Progress Notes (Addendum)
Subjective: Patient reports pain improved compared with yesterday.  Thinks the surgery helped somewhat, but still has mid back pain.  Objective: Vital signs in last 24 hours: Temp:  [97.5 F (36.4 C)-99 F (37.2 C)] 97.7 F (36.5 C) (07/02 0700) Pulse Rate:  [97-110] 101 (07/02 0800) Resp:  [16-27] 16 (07/02 0700) BP: (115-127)/(71-81) 127/78 (07/02 0700) SpO2:  [91 %-98 %] 96 % (07/02 0800)  Intake/Output from previous day: 07/01 0701 - 07/02 0700 In: 1845.3 [I.V.:1845.3] Out: 950 [Urine:950] Intake/Output this shift: No intake/output data recorded.  Drowsy, gaunt, deconditioned Dressing c/d 4/5 strength in lowers, mainly limited by deconditioning.  Lab Results: Recent Labs    09/18/21 0253  WBC 14.8*  HGB 9.3*  HCT 27.8*  PLT 481*   BMET Recent Labs    09/18/21 0253 09/19/21 0103  NA  --  132*  K  --  4.9  CL  --  104  CO2  --  15*  GLUCOSE  --  128*  BUN  --  12  CREATININE 0.50* 0.57*  CALCIUM  --  6.9*    Studies/Results: DG Lumbar Spine 1 View  Result Date: 09/09/2021 CLINICAL DATA:  Portable lateral lumbar spine image for surgical localization. EXAM: LUMBAR SPINE - 1 VIEW COMPARISON:  MR lumbar spine, 08/25/2021. FINDINGS: Surgical probe has been inserted, tip projecting 2.5 cm posterior to the posterior upper endplate of L4. IMPRESSION: Surgical localization imaging as described. Electronically Signed   By: Lajean Manes M.D.   On: 08/22/2021 19:20    Assessment/Plan: 61 yo M with widely metastatic NSCLC s/p lumbar microdiscectomies - mobilize with PT/OT - pain control remains difficult, as he is already appearing drowsy and hypoventilatory.  May benefit from some Toradol short term, though planning to restart his Xarelto tomorrow - likely would benefit from palliative care consult as well     Vallarie Mare 09/19/2021, 11:21 AM

## 2021-09-20 ENCOUNTER — Encounter (HOSPITAL_COMMUNITY): Payer: Self-pay | Admitting: Neurosurgery

## 2021-09-20 MED ORDER — OXYCODONE HCL ER 10 MG PO T12A
20.0000 mg | EXTENDED_RELEASE_TABLET | Freq: Two times a day (BID) | ORAL | Status: DC
  Administered 2021-09-20 – 2021-09-22 (×5): 20 mg via ORAL
  Filled 2021-09-20 (×6): qty 2

## 2021-09-20 MED FILL — Thrombin For Soln 5000 Unit: CUTANEOUS | Qty: 2 | Status: AC

## 2021-09-20 NOTE — Progress Notes (Signed)
Patient in pain moaning asking for IV pain meds. Patient medicated as per orders see MAR. 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 Patients wife and son at bedside asking for MD. Dr. Christella Noa notified and will make rounds after surgery today will notify the family.

## 2021-09-20 NOTE — Evaluation (Signed)
Occupational Therapy Evaluation Patient Details Name: Bill Garza MRN: [id number] DOB: 1960-04-14 Today's Date: 09/20/2021   History of Present Illness Pt is a 61yo M s/p R L4-5 microdiscectomy and R L3-4 discetomy on 6/30. 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   Clinical Impression   Bill Garza was evaluated s/p the above admission list, he was generally mod I with mobility and wife assisted with ADLs as needed. Upon evaluation pt was limited by pain and generalized weakness. Overall he was limited to bed level with max A +2 for rolling and UB ADLs, and total A for all LB ADLs. He will benefits from OT acutely. Recommend SNF at this time due to deficits listed below.      Recommendations for follow up therapy are one component of a multi-disciplinary discharge planning process, led by the attending physician.  Recommendations may be updated based on patient status, additional functional criteria and insurance authorization.   Follow Up Recommendations  Skilled nursing-short term rehab (<3 hours/day)    Assistance Recommended at Discharge Frequent or constant Supervision/Assistance  Patient can return home with the following A lot of help with walking and/or transfers;A lot of help with bathing/dressing/bathroom;Assistance with feeding;Assistance with cooking/housework;Direct supervision/assist for medications management;Direct supervision/assist for financial management;Assist for transportation;Help with stairs or ramp for entrance    Functional Status Assessment  Patient has had a recent decline in their functional status and demonstrates the ability to make significant improvements in function in a reasonable and predictable amount of time.  Equipment Recommendations  Other (comment) (pending progression)       Precautions / Restrictions Precautions Precautions: Fall;Back Precaution Booklet Issued: No Restrictions Weight  Bearing Restrictions: No      Mobility Bed Mobility Overal bed mobility: Needs Assistance Bed Mobility: Rolling Rolling: Max assist, +2 for physical assistance         General bed mobility comments: cues for using bed rail, manual assist required at trunk, UEs, and bed bad to roll. Able to stay on L side for approximately 5 min    Transfers Overall transfer level: Needs assistance                 General transfer comment: unable to attempt due to pain          ADL either performed or assessed with clinical judgement   ADL Overall ADL's : Needs assistance/impaired Eating/Feeding: Bed level;Maximal assistance   Grooming: Maximal assistance;Bed level   Upper Body Bathing: Maximal assistance;Bed level   Lower Body Bathing: Total assistance   Upper Body Dressing : Maximal assistance;Bed level   Lower Body Dressing: Total assistance   Toilet Transfer: Total assistance   Toileting- Clothing Manipulation and Hygiene: Total assistance       Functional mobility during ADLs: Maximal assistance;+2 for physical assistance;+2 for safety/equipment General ADL Comments: limited to bed level and rolling due to pain     Vision Baseline Vision/History: 0 No visual deficits Vision Assessment?: No apparent visual deficits            Pertinent Vitals/Pain Pain Assessment Pain Assessment: Faces Faces Pain Scale: Hurts whole lot Pain Location: back Pain Descriptors / Indicators: Constant, Grimacing, Moaning, Crying Pain Intervention(s): Limited activity within patient's tolerance, Premedicated before session, Monitored during session        Extremity/Trunk Assessment Upper Extremity Assessment Upper Extremity Assessment: Generalized weakness   Lower Extremity Assessment Lower Extremity Assessment: Defer to PT evaluation  Cervical / Trunk Assessment Cervical / Trunk Assessment: Back Surgery   Communication Communication Communication: No difficulties    Cognition Arousal/Alertness: Lethargic Behavior During Therapy: WFL for tasks assessed/performed, Restless Overall Cognitive Status: Within Functional Limits for tasks assessed                                 General Comments: appeared cognitively intact but command following limited by pain     General Comments  pitting edema bilat feet, redness noted at sacrum - RN notified sacral pad added            Home Living Family/patient expects to be discharged to:: Private residence Living Arrangements: Spouse/significant other Available Help at Discharge: Family Type of Home: House Home Access: Stairs to enter Technical brewer of Steps: 3 Entrance Stairs-Rails: Right;Left Home Layout: One level     Bathroom Shower/Tub: Teacher, early years/pre: Standard     Home Equipment: Cane - single point   Additional Comments: Pt was ambulating modI with cane but wife providing assistance for bathing and dressing      Prior Functioning/Environment Prior Level of Function : Needs assist       Physical Assist : ADLs (physical)   ADLs (physical): Bathing;Dressing Mobility Comments: cane          OT Problem List: Decreased strength;Decreased range of motion;Decreased activity tolerance;Impaired balance (sitting and/or standing);Decreased safety awareness;Decreased knowledge of use of DME or AE;Decreased knowledge of precautions;Pain      OT Treatment/Interventions: Self-care/ADL training;Therapeutic exercise;DME and/or AE instruction;Therapeutic activities;Balance training;Patient/family education    OT Goals(Current goals can be found in the care plan section) Acute Rehab OT Goals Patient Stated Goal: less pain OT Goal Formulation: With patient/family Time For Goal Achievement: 10/04/21 Potential to Achieve Goals: Good ADL Goals Pt Will Perform Grooming: sitting;with min assist Pt Will Perform Upper Body Dressing: with min assist;sitting Pt  Will Perform Lower Body Dressing: with mod assist;sit to/from stand Pt Will Transfer to Toilet: with mod assist;squat pivot transfer;bedside commode Additional ADL Goal #1: Pt will complete bed mobility with min A as a precursor to EOB ADLs  OT Frequency: Min 2X/week    Co-evaluation PT/OT/SLP Co-Evaluation/Treatment: Yes Reason for Co-Treatment: Complexity of the patient's impairments (multi-system involvement);Necessary to address cognition/behavior during functional activity PT goals addressed during session: Mobility/safety with mobility;Strengthening/ROM OT goals addressed during session: ADL's and self-care      AM-PAC OT "6 Clicks" Daily Activity     Outcome Measure Help from another person eating meals?: A Lot Help from another person taking care of personal grooming?: A Lot Help from another person toileting, which includes using toliet, bedpan, or urinal?: Total Help from another person bathing (including washing, rinsing, drying)?: A Lot Help from another person to put on and taking off regular upper body clothing?: A Lot Help from another person to put on and taking off regular lower body clothing?: Total 6 Click Score: 10   End of Session Nurse Communication: Mobility status  Activity Tolerance: Patient limited by pain Patient left: in bed;with call bell/phone within reach;with family/visitor present  OT Visit Diagnosis: Other abnormalities of gait and mobility (R26.89);Muscle weakness (generalized) (M62.81);Pain                Time: 8295-6213 OT Time Calculation (min): 20 min Charges:  OT General Charges $OT Visit: 1 Visit OT Evaluation $OT Eval Moderate Complexity: 1 Mod   Lorilyn Laitinen A  Osborne Serio 09/20/2021, 11:39 AM

## 2021-09-20 NOTE — Progress Notes (Signed)
Patient does not want to change beds pt refuses to turn education provided importance of turn and mobility. Pt states that he want IV pain meds first I explained that that med was discontinued he said give it to me before my family comes back. I did more education and ask patient to talk to his family about his wishes and pain control, pt states that his son is not a substance abuse counselor and that he wont listen to him. I suggested that he speak to his oncologist and surgeon about his wishes. Will continue to assess pain and medicate as needed or requested.

## 2021-09-20 NOTE — Evaluation (Signed)
Physical Therapy Evaluation Patient Details Name: Bill Garza MRN: [id number] DOB: [birth year] Today's Date: 09/20/2021  History of Present Illness  Pt is a 61yo M s/p R L4-5 microdiscectomy and R L3-4 discetomy on 6/30. 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  Clinical Impression  Prior to admission, pt was ambulating modI with a cane and requiring some assistance with ADLs. Pt currently requiring MaxA+2 to roll to L side. Pt unable to tolerate additional mobility this session due to significant reports of pain. Pt educated on deep breathing techniques and the importance of mobilization for his recovery. Pt agreeable to continue mobilization attempts with PT in future sessions. Pt would benefit from acute PT to address deficits in strength, ROM, and overall functional mobility. Recommending SNF upon discharge due to pts current level of assistance required and decreased tolerance to activity.        Recommendations for follow up therapy are one component of a multi-disciplinary discharge planning process, led by the attending physician.  Recommendations may be updated based on patient status, additional functional criteria and insurance authorization.  Follow Up Recommendations Skilled nursing-short term rehab (<3 hours/day) Can patient physically be transported by private vehicle: No    Assistance Recommended at Discharge Frequent or constant Supervision/Assistance  Patient can return home with the following  Two people to help with walking and/or transfers;Two people to help with bathing/dressing/bathroom;Assistance with cooking/housework;Assistance with feeding;Direct supervision/assist for medications management;Direct supervision/assist for financial management;Assist for transportation;Help with stairs or ramp for entrance    Equipment Recommendations Wheelchair (measurements PT);Wheelchair cushion (measurements PT);Hospital  bed;Other (comment) (hoyer lift)  Recommendations for Other Services       Functional Status Assessment Patient has had a recent decline in their functional status and demonstrates the ability to make significant improvements in function in a reasonable and predictable amount of time.     Precautions / Restrictions Precautions Precautions: Fall;Back Precaution Booklet Issued: No Restrictions Weight Bearing Restrictions: No      Mobility  Bed Mobility Overal bed mobility: Needs Assistance Bed Mobility: Rolling Rolling: Max assist, +2 for physical assistance         General bed mobility comments: cues for using bed rail, manual assist required at trunk, UEs, and bed bad to roll. Able to stay on L side for approximately 5 min    Transfers                        Ambulation/Gait                  Stairs            Wheelchair Mobility    Modified Rankin (Stroke Patients Only)       Balance                                             Pertinent Vitals/Pain Pain Assessment Pain Assessment: Faces Faces Pain Scale: Hurts whole lot Pain Location: back Pain Descriptors / Indicators: Constant, Grimacing, Moaning, Crying Pain Intervention(s): Limited activity within patient's tolerance, Monitored during session, Premedicated before session, Utilized relaxation techniques    Home Living Family/patient expects to be discharged to:: Private residence Living Arrangements: Spouse/significant other Available Help at Discharge: Family Type of Home: House Home Access: Stairs to enter Entrance Stairs-Rails: Audiological scientist  Stairs-Number of Steps: 3   Home Layout: One level Home Equipment: Cane - single point Additional Comments: Pt was ambulating modI with cane but wife providing assistance for bathing and dressing    Prior Function Prior Level of Function : Needs assist       Physical Assist : ADLs (physical)   ADLs  (physical): Bathing;Dressing Mobility Comments: cane       Hand Dominance        Extremity/Trunk Assessment   Upper Extremity Assessment Upper Extremity Assessment: Generalized weakness    Lower Extremity Assessment Lower Extremity Assessment: Generalized weakness (unable to perform heel slides or SLR, limited DF/PF bilaterally)    Cervical / Trunk Assessment Cervical / Trunk Assessment: Back Surgery  Communication   Communication: No difficulties  Cognition Arousal/Alertness: Lethargic Behavior During Therapy: WFL for tasks assessed/performed, Restless Overall Cognitive Status: Within Functional Limits for tasks assessed                                 General Comments: appeared cognitively intact but command following limited by pain        General Comments General comments (skin integrity, edema, etc.): pitting edema in B feet    Exercises     Assessment/Plan    PT Assessment Patient needs continued PT services  PT Problem List Decreased strength;Decreased range of motion;Decreased activity tolerance;Decreased balance;Decreased mobility;Pain       PT Treatment Interventions DME instruction;Gait training;Stair training;Functional mobility training;Therapeutic activities;Therapeutic exercise;Balance training;Patient/family education;Manual techniques;Wheelchair mobility training    PT Goals (Current goals can be found in the Care Plan section)  Acute Rehab PT Goals Patient Stated Goal: to return home PT Goal Formulation: With patient/family Time For Goal Achievement: 10/04/21 Potential to Achieve Goals: Fair    Frequency Min 5X/week     Co-evaluation PT/OT/SLP Co-Evaluation/Treatment: Yes Reason for Co-Treatment: Complexity of the patient's impairments (multi-system involvement);For patient/therapist safety;To address functional/ADL transfers PT goals addressed during session: Mobility/safety with mobility;Strengthening/ROM          AM-PAC PT "6 Clicks" Mobility  Outcome Measure Help needed turning from your back to your side while in a flat bed without using bedrails?: A Lot Help needed moving from lying on your back to sitting on the side of a flat bed without using bedrails?: Total Help needed moving to and from a bed to a chair (including a wheelchair)?: Total Help needed standing up from a chair using your arms (e.g., wheelchair or bedside chair)?: Total Help needed to walk in hospital room?: Total Help needed climbing 3-5 steps with a railing? : Total 6 Click Score: 7    End of Session   Activity Tolerance: Patient limited by pain Patient left: in bed;with call bell/phone within reach;with family/visitor present Nurse Communication: Mobility status PT Visit Diagnosis: Other abnormalities of gait and mobility (R26.89);Muscle weakness (generalized) (M62.81);Pain Pain - part of body:  (back)    Time: 8366-2947 PT Time Calculation (min) (ACUTE ONLY): 20 min   Charges:   PT Evaluation $PT Eval Moderate Complexity: Peck, SPT Acute Rehabilitation Services  Office: [phone]   Mackie Pai 09/20/2021, 11:10 AM

## 2021-09-20 NOTE — Plan of Care (Signed)

## 2021-09-20 NOTE — Progress Notes (Signed)
Patient ID: Bill Garza, male   DOB: Nov 29, 1960, 61 y.o.   MRN: [id number] BP 120/81 (BP Location: Right Arm)   Pulse (!) 109   Temp 98.6 F (37 C) (Oral)   Resp (!) 22   Ht 6' (1.829 m)   Wt 77.1 kg   SpO2 97%   BMI 23.06 kg/m  Alert and oriented. Complaining of continued pain. Discs are removed. Needs more time to actually determine if the discs were the reason for most of his pain.  Will continue current care. Family not available at 1445 this afternoon after requesting to see me.  Moving all extremities Not complaining of a headache.

## 2021-09-21 ENCOUNTER — Inpatient Hospital Stay (HOSPITAL_COMMUNITY): Payer: Medicare HMO

## 2021-09-21 LAB — GLUCOSE, CAPILLARY
Glucose-Capillary: 100 mg/dL — ABNORMAL HIGH (ref 70–99)
Glucose-Capillary: 111 mg/dL — ABNORMAL HIGH (ref 70–99)
Glucose-Capillary: 119 mg/dL — ABNORMAL HIGH (ref 70–99)

## 2021-09-21 LAB — BASIC METABOLIC PANEL
Anion gap: 19 — ABNORMAL HIGH (ref 5–15)
BUN: 21 mg/dL — ABNORMAL HIGH (ref 6–20)
CO2: 11 mmol/L — ABNORMAL LOW (ref 22–32)
Calcium: 7.5 mg/dL — ABNORMAL LOW (ref 8.9–10.3)
Chloride: 108 mmol/L (ref 98–111)
Creatinine, Ser: 0.5 mg/dL — ABNORMAL LOW (ref 0.61–1.24)
GFR, Estimated: >60 mL/min (ref >60)
Glucose, Bld: 102 mg/dL — ABNORMAL HIGH (ref 70–99)
Potassium: 5.2 mmol/L — ABNORMAL HIGH (ref 3.5–5.1)
Sodium: 138 mmol/L (ref 135–145)

## 2021-09-21 MED ORDER — HYDROMORPHONE HCL 2 MG PO TABS
4.0000 mg | ORAL_TABLET | ORAL | Status: DC | PRN
  Administered 2021-09-21 – 2021-09-22 (×4): 4 mg via ORAL
  Filled 2021-09-21 (×4): qty 2

## 2021-09-21 MED ORDER — HYDROMORPHONE HCL 2 MG PO TABS
4.0000 mg | ORAL_TABLET | ORAL | Status: DC | PRN
  Administered 2021-09-21: 4 mg via ORAL
  Filled 2021-09-21: qty 2

## 2021-09-21 NOTE — Progress Notes (Signed)
Patient is very adamant about getting IV dilaudid for his pain gave patient  medication that was on the [facility] but this am he stated that he needed the dilaudid. IV I informed him that  he needs to calm down and slow down his breathing and hfe was getting himself all worked up is not doing any good and that the medications that I gave will be less effective with his actions. Arthor Captain LPN

## 2021-09-21 NOTE — Progress Notes (Signed)
Physical Therapy Treatment Patient Details Name: Bill Garza MRN: [id number] DOB: 1960/10/05 Today's Date: 09/21/2021   History of Present Illness Pt is a 61yo M s/p R L4-5 microdiscectomy and R L3-4 discetomy on 6/30. 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    PT Comments    Patient able to get up to [facility] this session, but not well tolerated HR up to 136 and SpO2 at times in 80's (mostly mid to low 90's) and RR in 40's.   Wife present and somewhat supportive, but states feels pt can do more.  Wonder how much his brain mets are limiting him.  Seems most appropriate for SNF level rehab at d/c.  PT will follow.   Recommendations for follow up therapy are one component of a multi-disciplinary discharge planning process, led by the attending physician.  Recommendations may be updated based on patient status, additional functional criteria and insurance authorization.  Follow Up Recommendations  Skilled nursing-short term rehab (<3 hours/day) Can patient physically be transported by private vehicle: No   Assistance Recommended at Discharge Frequent or constant Supervision/Assistance  Patient can return home with the following Two people to help with walking and/or transfers;Two people to help with bathing/dressing/bathroom;Assistance with cooking/housework;Assistance with feeding;Direct supervision/assist for medications management;Direct supervision/assist for financial management;Assist for transportation;Help with stairs or ramp for entrance   Equipment Recommendations  Wheelchair (measurements PT);Wheelchair cushion (measurements PT);Hospital bed;Other (comment)    Recommendations for Other Services       Precautions / Restrictions Precautions Precautions: Fall;Back Precaution Comments: watch HR     Mobility  Bed Mobility Overal bed mobility: Needs Assistance Bed Mobility: Rolling, Sidelying to Sit, Sit to Supine Rolling:  Mod assist Sidelying to sit: Mod assist, HOB elevated   Sit to supine: Mod assist, +2 for safety/equipment   General bed mobility comments: cues for technique, pt helping to lift trunk pushing up with rail assist for legs off bed and to help with lifting trunk; to supine assist for legs onto bed and to position, clean BM and scoot upin bed    Transfers Overall transfer level: Needs assistance   Transfers: Bed to chair/wheelchair/BSC Sit to Stand: Max assist, +2 physical assistance     Squat pivot transfers: Max assist     General transfer comment: unable to stand to RW with +2 A pt not pushing through LE's so pivot to [facility] as pt reports having BM with max cues for reaching to armrest and using drop arm BSC; same for back to bed though did attempt to stand twice with RW Then with face to face    Ambulation/Gait                   Stairs             Wheelchair Mobility    Modified Rankin (Stroke Patients Only)       Balance Overall balance assessment: Needs assistance   Sitting balance-Leahy Scale: Fair Sitting balance - Comments: on BSC no back support     Standing balance-Leahy Scale: Zero Standing balance comment: unable to tolerate standing                            Cognition Arousal/Alertness: Awake/alert Behavior During Therapy: Anxious Overall Cognitive Status: Impaired/Different from baseline Area of Impairment: Following commands, Attention, Safety/judgement  Current Attention Level: Sustained   Following Commands: Follows one step commands with increased time, Follows one step commands inconsistently Safety/Judgement: Decreased awareness of deficits, Decreased awareness of safety     General Comments: moaning throughout and though states attempting to stand putting forth very little effort with pain complaints on 7/10,. wife in the room and reports pt much more mobile at home        Exercises       General Comments        Pertinent Vitals/Pain Pain Assessment Pain Assessment: 0-10 Pain Score: 7  Pain Location: back Pain Descriptors / Indicators: Constant, Grimacing, Moaning, Crying Pain Intervention(s): Monitored during session, Premedicated before session    Home Living                          Prior Function            PT Goals (current goals can now be found in the care plan section) Progress towards PT goals: Progressing toward goals    Frequency           PT Plan Current plan remains appropriate    Co-evaluation              AM-PAC PT "6 Clicks" Mobility   Outcome Measure  Help needed turning from your back to your side while in a flat bed without using bedrails?: A Lot Help needed moving from lying on your back to sitting on the side of a flat bed without using bedrails?: A Lot Help needed moving to and from a bed to a chair (including a wheelchair)?: Total Help needed standing up from a chair using your arms (e.g., wheelchair or bedside chair)?: Total Help needed to walk in hospital room?: Total Help needed climbing 3-5 steps with a railing? : Total 6 Click Score: 8    End of Session Equipment Utilized During Treatment: Gait belt Activity Tolerance: Patient limited by pain Patient left: in bed;with call bell/phone within reach;with bed alarm set;with family/visitor present   PT Visit Diagnosis: Other abnormalities of gait and mobility (R26.89);Muscle weakness (generalized) (M62.81);Pain;Other symptoms and signs involving the nervous system (R29.898) Pain - part of body:  (back)     Time: 0100-7121 PT Time Calculation (min) (ACUTE ONLY): 36 min  Charges:  $Therapeutic Activity: 23-37 mins                     Magda Kiel, PT Acute Rehabilitation Services Office:[phone] 09/21/2021    Reginia Naas 09/21/2021, 2:44 PM

## 2021-09-21 NOTE — Progress Notes (Signed)
Patient ID: Bill Garza, male   DOB: Oct 26, 1960, 61 y.o.   MRN: [id number] BP (!) 154/94 (BP Location: Left Arm)   Pulse (!) 118   Temp 98 F (36.7 C)   Resp 20   Ht 6' (1.829 m)   Wt 77.1 kg   SpO2 90%   BMI 23.06 kg/m  Alert and oriented, just moaning in bed asking for a breakthrough shot. Then asks for pain medication.  His pain is severely out of proportion for a simple discetomy. At this time he is displaying drug seeking behavior. Will order a kub as he complained of right upper quadrant pain.  Moving all extremities Wound is clean, no signs of infection.

## 2021-09-21 NOTE — Progress Notes (Signed)
Neurosurgery PA was informed of patient pain is not controled with the pain management that he is receiving and that he is moaning an groining and states pain is 10/10. No new orders patient was informed of PA's order that no new orders as of this time. Will continue to monitor. Arthor Captain LPN

## 2021-09-22 ENCOUNTER — Encounter: Payer: Self-pay | Admitting: Nurse Practitioner

## 2021-09-22 ENCOUNTER — Inpatient Hospital Stay (HOSPITAL_COMMUNITY): Payer: Medicare HMO

## 2021-09-22 ENCOUNTER — Inpatient Hospital Stay: Payer: Medicare HMO

## 2021-09-22 ENCOUNTER — Inpatient Hospital Stay: Payer: Medicare HMO | Admitting: Hematology & Oncology

## 2021-09-22 DIAGNOSIS — M48061 Spinal stenosis, lumbar region without neurogenic claudication: Secondary | ICD-10-CM

## 2021-09-22 DIAGNOSIS — C7951 Secondary malignant neoplasm of bone: Secondary | ICD-10-CM

## 2021-09-22 DIAGNOSIS — Z9911 Dependence on respirator [ventilator] status: Secondary | ICD-10-CM

## 2021-09-22 DIAGNOSIS — E43 Unspecified severe protein-calorie malnutrition: Secondary | ICD-10-CM | POA: Insufficient documentation

## 2021-09-22 DIAGNOSIS — J9600 Acute respiratory failure, unspecified whether with hypoxia or hypercapnia: Secondary | ICD-10-CM

## 2021-09-22 DIAGNOSIS — C7931 Secondary malignant neoplasm of brain: Secondary | ICD-10-CM

## 2021-09-22 DIAGNOSIS — C787 Secondary malignant neoplasm of liver and intrahepatic bile duct: Secondary | ICD-10-CM

## 2021-09-22 DIAGNOSIS — R630 Anorexia: Secondary | ICD-10-CM

## 2021-09-22 DIAGNOSIS — C3492 Malignant neoplasm of unspecified part of left bronchus or lung: Secondary | ICD-10-CM | POA: Diagnosis not present

## 2021-09-22 LAB — COMPREHENSIVE METABOLIC PANEL
ALT: 95 U/L — ABNORMAL HIGH (ref 0–44)
AST: 357 U/L — ABNORMAL HIGH (ref 15–41)
Albumin: 1.5 g/dL — ABNORMAL LOW (ref 3.5–5.0)
Alkaline Phosphatase: 1019 U/L — ABNORMAL HIGH (ref 38–126)
Anion gap: 18 — ABNORMAL HIGH (ref 5–15)
BUN: 25 mg/dL — ABNORMAL HIGH (ref 6–20)
CO2: 10 mmol/L — ABNORMAL LOW (ref 22–32)
Calcium: 7.3 mg/dL — ABNORMAL LOW (ref 8.9–10.3)
Chloride: 113 mmol/L — ABNORMAL HIGH (ref 98–111)
Creatinine, Ser: 0.79 mg/dL (ref 0.61–1.24)
GFR, Estimated: >60 mL/min (ref >60)
Glucose, Bld: 131 mg/dL — ABNORMAL HIGH (ref 70–99)
Potassium: 5 mmol/L (ref 3.5–5.1)
Sodium: 141 mmol/L (ref 135–145)
Total Bilirubin: 2.5 mg/dL — ABNORMAL HIGH (ref 0.3–1.2)
Total Protein: 5.1 g/dL — ABNORMAL LOW (ref 6.5–8.1)

## 2021-09-22 LAB — MAGNESIUM
Magnesium: 2.1 mg/dL (ref 1.7–2.4)
Magnesium: 2.1 mg/dL (ref 1.7–2.4)

## 2021-09-22 LAB — CBC
HCT: 30.7 % — ABNORMAL LOW (ref 39.0–52.0)
Hemoglobin: 9.3 g/dL — ABNORMAL LOW (ref 13.0–17.0)
MCH: 29.8 pg (ref 26.0–34.0)
MCHC: 30.3 g/dL (ref 30.0–36.0)
MCV: 98.4 fL (ref 80.0–100.0)
Platelets: 465 10*3/uL — ABNORMAL HIGH (ref 150–400)
RBC: 3.12 MIL/uL — ABNORMAL LOW (ref 4.22–5.81)
RDW: 20.7 % — ABNORMAL HIGH (ref 11.5–15.5)
WBC: 11.3 10*3/uL — ABNORMAL HIGH (ref 4.0–10.5)
nRBC: 0.4 % — ABNORMAL HIGH (ref 0.0–0.2)

## 2021-09-22 LAB — GLUCOSE, CAPILLARY
Glucose-Capillary: 127 mg/dL — ABNORMAL HIGH (ref 70–99)
Glucose-Capillary: 129 mg/dL — ABNORMAL HIGH (ref 70–99)
Glucose-Capillary: 105 mg/dL — ABNORMAL HIGH (ref 70–99)
Glucose-Capillary: 166 mg/dL — ABNORMAL HIGH (ref 70–99)

## 2021-09-22 LAB — PHOSPHORUS
Phosphorus: 1.9 mg/dL — ABNORMAL LOW (ref 2.5–4.6)
Phosphorus: 1.8 mg/dL — ABNORMAL LOW (ref 2.5–4.6)

## 2021-09-22 LAB — BLOOD GAS, VENOUS
Acid-base deficit: 16.7 mmol/L — ABNORMAL HIGH (ref 0.0–2.0)
Bicarbonate: 11.3 mmol/L — ABNORMAL LOW (ref 20.0–28.0)
Drawn by: 58560
O2 Saturation: 87.6 %
Patient temperature: 37.9
pCO2, Ven: 35 mmHg — ABNORMAL LOW (ref 44–60)
pH, Ven: 7.12 — CL (ref 7.25–7.43)
pO2, Ven: 65 mmHg — ABNORMAL HIGH (ref 32–45)

## 2021-09-22 LAB — LACTIC ACID, PLASMA: Lactic Acid, Venous: 2.2 mmol/L (ref 0.5–1.9)

## 2021-09-22 MED ORDER — LORATADINE 10 MG PO TABS
10.0000 mg | ORAL_TABLET | Freq: Every day | ORAL | Status: DC
  Administered 2021-09-22: 10 mg
  Filled 2021-09-22: qty 1

## 2021-09-22 MED ORDER — LORAZEPAM 1 MG PO TABS: 1.0000 mg | ORAL_TABLET | Freq: Three times a day (TID) | ORAL | Status: DC | PRN

## 2021-09-22 MED ORDER — ROCURONIUM BROMIDE 10 MG/ML (PF) SYRINGE
PREFILLED_SYRINGE | INTRAVENOUS | Status: AC
  Filled 2021-09-22: qty 10

## 2021-09-22 MED ORDER — ESCITALOPRAM OXALATE 10 MG PO TABS
5.0000 mg | ORAL_TABLET | Freq: Every morning | ORAL | Status: DC
  Administered 2021-09-23 – 2021-09-26 (×4): 5 mg
  Filled 2021-09-22 (×4): qty 1

## 2021-09-22 MED ORDER — FUROSEMIDE 10 MG/ML IJ SOLN: 40.0000 mg | Freq: Once | INTRAMUSCULAR | Status: DC

## 2021-09-22 MED ORDER — MIDAZOLAM HCL 2 MG/2ML IJ SOLN
2.0000 mg | INTRAMUSCULAR | Status: DC | PRN
  Administered 2021-09-23: 2 mg via INTRAVENOUS
  Administered 2021-09-24: 1 mg via INTRAVENOUS
  Filled 2021-09-22 (×3): qty 2

## 2021-09-22 MED ORDER — FENTANYL CITRATE PF 50 MCG/ML IJ SOSY
PREFILLED_SYRINGE | INTRAMUSCULAR | Status: AC
  Filled 2021-09-22: qty 2

## 2021-09-22 MED ORDER — HYDROMORPHONE BOLUS VIA INFUSION
0.2500 mg | INTRAVENOUS | Status: DC | PRN
  Administered 2021-09-22: 2 mg via INTRAVENOUS
  Administered 2021-09-24: 1 mg via INTRAVENOUS

## 2021-09-22 MED ORDER — POLYETHYLENE GLYCOL 3350 17 G PO PACK
17.0000 g | PACK | Freq: Every day | ORAL | Status: DC
  Administered 2021-09-22 – 2021-09-24 (×2): 17 g
  Filled 2021-09-22 (×2): qty 1

## 2021-09-22 MED ORDER — SENNOSIDES-DOCUSATE SODIUM 8.6-50 MG PO TABS
1.0000 | ORAL_TABLET | Freq: Two times a day (BID) | ORAL | Status: DC
  Administered 2021-09-22 – 2021-09-24 (×3): 1
  Filled 2021-09-22 (×3): qty 1

## 2021-09-22 MED ORDER — SENNOSIDES-DOCUSATE SODIUM 8.6-50 MG PO TABS: 1.0000 | ORAL_TABLET | Freq: Two times a day (BID) | ORAL | Status: DC

## 2021-09-22 MED ORDER — PROSOURCE TF PO LIQD: 45.0000 mL | Freq: Every day | ORAL | Status: DC

## 2021-09-22 MED ORDER — MIDAZOLAM HCL 2 MG/2ML IJ SOLN
INTRAMUSCULAR | Status: AC | PRN
  Administered 2021-09-22: 4 mg via INTRAVENOUS

## 2021-09-22 MED ORDER — ADULT MULTIVITAMIN W/MINERALS CH
1.0000 | ORAL_TABLET | Freq: Every day | ORAL | Status: DC
  Administered 2021-09-23 – 2021-09-26 (×4): 1
  Filled 2021-09-22 (×5): qty 1

## 2021-09-22 MED ORDER — VANCOMYCIN HCL 1250 MG/250ML IV SOLN
1250.0000 mg | Freq: Two times a day (BID) | INTRAVENOUS | Status: DC
  Administered 2021-09-22 – 2021-09-27 (×9): 1250 mg via INTRAVENOUS
  Filled 2021-09-22 (×10): qty 250

## 2021-09-22 MED ORDER — VITAL 1.5 CAL PO LIQD
1000.0000 mL | ORAL | Status: DC
Start: 2021-09-22 — End: 2021-09-22

## 2021-09-22 MED ORDER — ORAL CARE MOUTH RINSE
15.0000 mL | OROMUCOSAL | Status: DC | PRN
Start: 2021-09-22 — End: 2021-09-27

## 2021-09-22 MED ORDER — CYCLOBENZAPRINE HCL 10 MG PO TABS: 5.0000 mg | ORAL_TABLET | Freq: Three times a day (TID) | ORAL | Status: DC | PRN

## 2021-09-22 MED ORDER — ETOMIDATE 2 MG/ML IV SOLN
INTRAVENOUS | Status: AC | PRN
  Administered 2021-09-22: 20 mg via INTRAVENOUS

## 2021-09-22 MED ORDER — PROSOURCE TF PO LIQD
45.0000 mL | Freq: Two times a day (BID) | ORAL | Status: DC
  Administered 2021-09-22: 45 mL
  Filled 2021-09-22: qty 45

## 2021-09-22 MED ORDER — MIDAZOLAM HCL 2 MG/2ML IJ SOLN: 2.0000 mg | INTRAMUSCULAR | Status: DC | PRN

## 2021-09-22 MED ORDER — SENNOSIDES-DOCUSATE SODIUM 8.6-50 MG PO TABS: 2.0000 | ORAL_TABLET | Freq: Two times a day (BID) | ORAL | Status: DC | PRN

## 2021-09-22 MED ORDER — PANTOPRAZOLE 2 MG/ML SUSPENSION
40.0000 mg | Freq: Every day | ORAL | Status: DC
  Administered 2021-09-22 – 2021-09-26 (×5): 40 mg
  Filled 2021-09-22 (×5): qty 20

## 2021-09-22 MED ORDER — INSULIN ASPART 100 UNIT/ML IJ SOLN
0.0000 [IU] | INTRAMUSCULAR | Status: DC
  Administered 2021-09-22: 3 [IU] via SUBCUTANEOUS
  Administered 2021-09-22 (×2): 2 [IU] via SUBCUTANEOUS
  Administered 2021-09-23: 3 [IU] via SUBCUTANEOUS
  Administered 2021-09-23: 2 [IU] via SUBCUTANEOUS
  Administered 2021-09-23: 3 [IU] via SUBCUTANEOUS
  Administered 2021-09-24: 5 [IU] via SUBCUTANEOUS
  Administered 2021-09-24 (×2): 3 [IU] via SUBCUTANEOUS

## 2021-09-22 MED ORDER — OXYCODONE HCL 5 MG PO TABS
10.0000 mg | ORAL_TABLET | Freq: Four times a day (QID) | ORAL | Status: DC
  Administered 2021-09-22 – 2021-09-26 (×16): 10 mg
  Filled 2021-09-22 (×16): qty 2

## 2021-09-22 MED ORDER — INSULIN ASPART 100 UNIT/ML IJ SOLN: 0.0000 [IU] | Freq: Three times a day (TID) | INTRAMUSCULAR | Status: DC

## 2021-09-22 MED ORDER — ENOXAPARIN SODIUM 80 MG/0.8ML IJ SOSY
80.0000 mg | PREFILLED_SYRINGE | Freq: Two times a day (BID) | INTRAMUSCULAR | Status: DC
Start: 2021-09-22 — End: 2021-09-23
  Administered 2021-09-22 – 2021-09-23 (×3): 80 mg via SUBCUTANEOUS
  Filled 2021-09-22 (×3): qty 0.8

## 2021-09-22 MED ORDER — DOCUSATE SODIUM 50 MG/5ML PO LIQD
100.0000 mg | Freq: Two times a day (BID) | ORAL | Status: DC
  Administered 2021-09-22 – 2021-09-24 (×3): 100 mg
  Filled 2021-09-22 (×4): qty 10

## 2021-09-22 MED ORDER — ROCURONIUM BROMIDE 50 MG/5ML IV SOLN
INTRAVENOUS | Status: AC | PRN
  Administered 2021-09-22: 80 mg via INTRAVENOUS

## 2021-09-22 MED ORDER — MONTELUKAST SODIUM 10 MG PO TABS
10.0000 mg | ORAL_TABLET | Freq: Every day | ORAL | Status: DC
  Administered 2021-09-22 – 2021-09-26 (×5): 10 mg
  Filled 2021-09-22 (×6): qty 1

## 2021-09-22 MED ORDER — HYDROMORPHONE HCL 1 MG/ML IJ SOLN: 1.0000 mg | Freq: Once | INTRAMUSCULAR | Status: DC

## 2021-09-22 MED ORDER — FENTANYL CITRATE (PF) 100 MCG/2ML IJ SOLN
INTRAMUSCULAR | Status: AC | PRN
  Administered 2021-09-22 (×2): 200 ug via INTRAVENOUS

## 2021-09-22 MED ORDER — BISACODYL 10 MG RE SUPP
10.0000 mg | Freq: Once | RECTAL | Status: AC
Start: 2021-09-22 — End: 2021-09-22
  Administered 2021-09-22: 10 mg via RECTAL
  Filled 2021-09-22: qty 1

## 2021-09-22 MED ORDER — POLYETHYLENE GLYCOL 3350 17 G PO PACK: 17.0000 g | PACK | Freq: Two times a day (BID) | ORAL | Status: DC

## 2021-09-22 MED ORDER — VITAL AF 1.2 CAL PO LIQD
1000.0000 mL | ORAL | Status: DC
Start: 2021-09-22 — End: 2021-09-27
  Administered 2021-09-23 – 2021-09-26 (×4): 1000 mL

## 2021-09-22 MED ORDER — ACETAMINOPHEN 650 MG RE SUPP: 650.0000 mg | RECTAL | Status: DC | PRN

## 2021-09-22 MED ORDER — ORAL CARE MOUTH RINSE
15.0000 mL | OROMUCOSAL | Status: DC
  Administered 2021-09-22 – 2021-09-27 (×57): 15 mL via OROMUCOSAL

## 2021-09-22 MED ORDER — CHLORHEXIDINE GLUCONATE CLOTH 2 % EX PADS
6.0000 | MEDICATED_PAD | Freq: Every day | CUTANEOUS | Status: DC
  Administered 2021-09-22 – 2021-09-26 (×4): 6 via TOPICAL

## 2021-09-22 MED ORDER — VITAL HIGH PROTEIN PO LIQD: 1000.0000 mL | ORAL | Status: DC

## 2021-09-22 MED ORDER — SODIUM PHOSPHATES 45 MMOLE/15ML IV SOLN
30.0000 mmol | Freq: Once | INTRAVENOUS | Status: AC
  Administered 2021-09-22: 30 mmol via INTRAVENOUS
  Filled 2021-09-22: qty 10

## 2021-09-22 MED ORDER — VANCOMYCIN HCL 1500 MG/300ML IV SOLN
1500.0000 mg | Freq: Once | INTRAVENOUS | Status: AC
  Administered 2021-09-22: 1500 mg via INTRAVENOUS
  Filled 2021-09-22: qty 300

## 2021-09-22 MED ORDER — VITAL HIGH PROTEIN PO LIQD
1000.0000 mL | ORAL | Status: DC
  Administered 2021-09-22: 1000 mL

## 2021-09-22 MED ORDER — SODIUM CHLORIDE 0.9 % IV SOLN
2.0000 g | Freq: Three times a day (TID) | INTRAVENOUS | Status: DC
  Administered 2021-09-22 – 2021-09-24 (×6): 2 g via INTRAVENOUS
  Filled 2021-09-22 (×6): qty 12.5

## 2021-09-22 MED ORDER — INSULIN ASPART 100 UNIT/ML IJ SOLN: 0.0000 [IU] | Freq: Every day | INTRAMUSCULAR | Status: DC

## 2021-09-22 MED ORDER — ASPIRIN 81 MG PO CHEW
81.0000 mg | CHEWABLE_TABLET | Freq: Every day | ORAL | Status: DC
  Administered 2021-09-23 – 2021-09-26 (×4): 81 mg
  Filled 2021-09-22 (×5): qty 1

## 2021-09-22 MED ORDER — ACETAMINOPHEN 325 MG PO TABS
650.0000 mg | ORAL_TABLET | ORAL | Status: DC | PRN
  Administered 2021-09-22 – 2021-09-23 (×3): 650 mg
  Filled 2021-09-22 (×3): qty 2

## 2021-09-22 MED ORDER — SODIUM CHLORIDE 0.9 % IV SOLN
0.5000 mg/h | INTRAVENOUS | Status: DC
  Administered 2021-09-22 – 2021-09-23 (×2): 1 mg/h via INTRAVENOUS
  Administered 2021-09-25: 1.5 mg/h via INTRAVENOUS
  Administered 2021-09-26 (×2): 3 mg/h via INTRAVENOUS
  Filled 2021-09-22 (×7): qty 5

## 2021-09-22 NOTE — Progress Notes (Addendum)
[place] for Lovenox Indication:  hx portal vein thrombosis   Labs: Recent Labs    09/21/21 0957  CREATININE 0.50*    Assessment: 24 yom on Xarelto PTA for hx of portal vein thrombosis, held since admission with patient s/p microdiscectomy on 6/30. Heparin [place] prophylactic dosing resumed 7/1 (last dose 7/5 at 0620). Patient transferred to ICU and urgently intubated 7/5 AM with respiratory distress. Pharmacy consulted to transition to Lovenox treatment dose for now. Last Hg down to 9.3, plt down to 481 on 7/1. SCr stable 0.5 (last 7/4). Repeat labs pending for today. No bleed issues reported.  Goal of Therapy:  Anti-Xa level 0.6-1 units/ml 4hrs after LMWH dose given Monitor platelets by anticoagulation protocol: Yes   Plan:  D/c heparin [place] Start Lovenox 80mg  (1 mg/kg rounded to nearest syringe size) [place] q12h Monitor CBC, SCr, wt changes, for s/sx bleeding   Arturo Morton, PharmD, BCPS Clinical Pharmacist 09/22/2021 10:36 AM

## 2021-09-22 NOTE — Progress Notes (Signed)
PT Cancellation Note  Patient Details Name: Bill Garza MRN: [id number] DOB: Oct 06, 1960   Cancelled Treatment:    Reason Eval/Treat Not Completed: Medical issues which prohibited therapy Patient transferred to ICU for intubation. Will re-attempt once medically appropriate.   [name] A. Gilford Rile PT, DPT Acute Rehabilitation Services Office [phone]    Linna Hoff 09/22/2021, 10:10 AM

## 2021-09-22 NOTE — Progress Notes (Signed)
Pharmacy Antibiotic Note  Bill Garza is a 61 y.o. male admitted on 08/27/2021 with pneumonia.  Pharmacy has been consulted for vancomycin/cefepime dosing. SCr 0.5, stable this admit (last 7/4).  Plan: Cefepime 2g IV q8h Vancomycin 1500mg  IV x 1; then 1250mg  IV q12h. Goal AUC 400-550. Expected AUC: 472 SCr used: 0.8 Monitor clinical progress, c/s, renal function F/u de-escalation plan/LOT, vancomycin levels as indicated   Height: 6' (182.9 cm) Weight: 77.1 kg (170 lb) IBW/kg (Calculated) : 77.6  Temp (24hrs), Avg:99.3 F (37.4 C), Min:98.5 F (36.9 C), Max:100 F (37.8 C)  Recent Labs  Lab 09/18/21 0253 09/19/21 0103 09/21/21 0957  WBC 14.8*  --   --   CREATININE 0.50* 0.57* 0.50*    Estimated Creatinine Clearance: 109.8 mL/min (A) (by C-G formula based on SCr of 0.5 mg/dL (L)).    Allergies  Allergen Reactions   Keppra [Levetiracetam] Other (See Comments)    Personality changes   Lyrica [Pregabalin] Swelling    Leg swelling    Neurontin [Gabapentin] Swelling    Leg swelling    Compazine [Prochlorperazine] Nausea And Vomiting   Duloxetine Other (See Comments)    Excessive sleeping   Other Other (See Comments)    Antibiotics - per wife at bedside, patient will develop C-diff if given "any antibiotic"   Wellbutrin [Bupropion] Nausea And Vomiting   Zetia [Ezetimibe] Other (See Comments)    Unknown reaction   Lamictal [Lamotrigine] Itching and Rash    Antimicrobials this admission: 7/5 vancomycin >>  7/5 cefepime >>   Dose adjustments this admission:   Microbiology results:   Arturo Morton, PharmD, BCPS Please check AMION for all [place] contact numbers Clinical Pharmacist 09/22/2021 10:17 AM

## 2021-09-22 NOTE — Progress Notes (Addendum)
Initial Nutrition Assessment  DOCUMENTATION CODES:   Severe malnutrition in context of chronic illness  INTERVENTION:    Tube Feeds via Cortrak: Start Vital 1.2 @ 40 mL/hr and advance by 10 mL q6h to goal rate of 750 mL/hr (1800 mL/day) Provides 2160 kcal, 130 gm protein, 1480 mL free water daily. Monitor magnesium, potassium, and phosphorus BID for at least 3 days, MD to replete as needed, as pt is at risk for refeeding syndrome given malnutrition.  NUTRITION DIAGNOSIS :   Severe Malnutrition related to chronic illness (cancer) as evidenced by severe fat depletion, severe muscle depletion.  GOAL:   Patient will meet greater than or equal to 90% of their needs  MONITOR:   Vent status, Labs, TF tolerance, Weight trends, I & O's  REASON FOR ASSESSMENT:   Consult Enteral/tube feeding initiation and management, Assessment of nutrition requirement/status  ASSESSMENT:   61 y.o. male with PMH of  NSCLC with mets to the brain, spine, and liver, GERD, COPD, T2DM, HTN, and gastroparesis. Pt admitted after a R lumbar discectomy.   7/05 - Rapid response called, required intubation and transfer to ICU  Spoke with pt family in room. Reports that his PO intake has been poor PTA due to no energy. Denies any use of ONS at home.  Suspect that current weight is inaccurate in chart.  Patient is currently intubated on ventilator support. MV: 14.8 L/min Temp (24hrs), Avg:99.5 F (37.5 C), Min:98.5 F (36.9 C), Max:100.3 F (37.9 C)  Medications reviewed and include: Dulcolax, Colace, NovoLog, MVI, Protonix,, Miralax, Senokot, IV antibiotics  Labs reviewed: BUN 25, Phosphorus 1.9, Hgb A1c 7.1% (08/25/21), 24 hr CBG 100-127  NUTRITION - FOCUSED PHYSICAL EXAM:  Flowsheet Row Most Recent Value  Orbital Region Moderate depletion  Upper Arm Region Severe depletion  Thoracic and Lumbar Region Severe depletion  Buccal Region Unable to assess  Temple Region Severe depletion  Clavicle Bone  Region Severe depletion  Clavicle and Acromion Bone Region Severe depletion  Scapular Bone Region Severe depletion  Dorsal Hand Moderate depletion  Patellar Region Mild depletion  Anterior Thigh Region Mild depletion  Posterior Calf Region Moderate depletion  Edema (RD Assessment) Mild  Hair Reviewed  Eyes Unable to assess  Mouth Unable to assess  Skin Reviewed  Nails Reviewed       Diet Order:   Diet Order             Diet NPO time specified  Diet effective now                   EDUCATION NEEDS:   Not appropriate for education at this time  Skin:  Skin Assessment: Reviewed RN Assessment  Last BM:  6/30  Height:   Ht Readings from Last 1 Encounters:  09/22/21 6' (1.829 m)    Weight:   Wt Readings from Last 1 Encounters:  08/28/2021 77.1 kg    Ideal Body Weight:  80.9 kg  BMI:  Body mass index is 23.06 kg/m.  Estimated Nutritional Needs:   Kcal:  2100-2300  Protein:  105-120 grams  Fluid:  >/= 2 L    Hermina Barters RD, LDN Clinical Dietitian See [facility] for contact information.

## 2021-09-22 NOTE — Consult Note (Signed)
NAME:  Bill Garza, MRN:  [id number], DOB:  1960-09-04, LOS: 5 ADMISSION DATE:  09/09/2021, CONSULTATION DATE:  7/5 REFERRING MD:  Dr. Christella Noa, CHIEF COMPLAINT:  SOB, AMS    History of Present Illness:  61 y/o M who was admitted on 6/30 for planned discectomy.    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 He is followed by [name] for Oncology.  Currently it appears that he is on Zometa, next dose planned 11/07/2021.  At his last office visit his ECOG score was estimated to be 2.  He has had recent admission in May for intractable pain to [facility] where he required nerve block.  In addition he had an admission June 7 for pain at [facility].  He has limited PO intake with weight loss and has been on marinol for appetite stimulant.   Patient was admitted on 6/30 in the setting of cancer related chronic back pain for planned discectomy of L3-4, 4-5.  Imaging on admission revealed new spinal lesions.  Postoperative course complicated by difficult to control pain.  Per wife, the patient developed increasing shortness of breath on 7/4, moaning and increased pain.  Shortness of breath increased on a.m. of 7/5 as well as altered mental status.  Rapid response team called for evaluation.  Chest x-ray ordered which revealed mild bronchitic changes in the right lower lobe.  No acute infiltrate.  BMP showed anion gap metabolic acidosis with notable drop in CO2 from previous day labs.  PCCM consulted for evaluation.   Pertinent  Medical History  NSCLC with metastatic disease to brain, liver, spine.  S/p SRS, 1 cycle of adjuvant chemotherapy with cisplatin, Keytruda, Alimta.  Additional left occipital brain lesion resection with metastatic poorly differentiated carcinoma. VATS resection 08/2020 Portal Vein Thrombosis  COPD  Tobacco Abuse-quit smoking approximately 13 years ago, 40-pack-year history CAD s/p MI  HLD HLD Depression / Anxiety   Chronic Pain   Significant Hospital Events: Including procedures, antibiotic start and stop dates in addition to other pertinent events   6/30 Admit for planned discectomy 7/5 Tx to ICU for resp distress, intubated  Interim History / Subjective:  As above  Objective   Blood pressure (!) 162/98, pulse (!) 124, temperature 99.5 F (37.5 C), temperature source Axillary, resp. rate (!) 28, height 6' (1.829 m), weight 77.1 kg, SpO2 95 %.    FiO2 (%):  [21 %] 21 %   Intake/Output Summary (Last 24 hours) at 09/22/2021 2536 Last data filed at 09/22/2021 6440 Gross per 24 hour  Intake 60 ml  Output 1975 ml  Net -1915 ml   Filed Weights   09/16/21 1403 09/11/2021 1351  Weight: 72.1 kg 77.1 kg    Examination: General: cachectic adult male lying in bed, appears distressed HENT: temporal wasting, MM pink/dry, long beard, pupils reactive, anicteric  Lungs: tachypnea with accessory muscle use, coarse rhonchi bilaterally  Cardiovascular: s1s2 RRR, tachy  Abdomen: soft, bsx4 active  Extremities: cool/dry, mottling on BLE's with 3+ pitting edema  Neuro: moaning, generalized weakness, moves upper extremities spontaneously, responds to voice   Resolved Hospital Problem list     Assessment & Plan:   Compensatory Acute Respiratory Alkalosis in setting of Metabolic Acidosis related to suspected Starvation Ketosis +/- Lactic Acidosis Pulmonary Nodules  Evidence of impending respiratory failure.   -transfer to ICU for intubation now, reviewed time limited trial with family  -PRVC 7cc/kg with rate of 28  in attempt to match intrinsic minute ventilation  -wean PEEP / fiO2 for sats >90% -CXR post intubation  -empiric pulmonary coverage ABX  -ABG post intubation  -assess lactic acid, UA for ketones -continue claritin, singulair -dulera   Acute Metabolic Encephalopathy  -PAD protocol with dilaudid & PRN versed  -transition oxycontin to Q6 oxycodone  -RASS Goal 0 to -1   NSCLC with Metastatic  Disease to Liver, Spine & Brain  Chronic Cancer Related Pain  Progression of disease on most recent imaging as of 08/25/21 -supportive care  -currently not a candidate for any therapy given clinical status  -PRN flexeril for muscle spasms  -bowel regimen with narcotics   Portal Vein Thrombosis  -full dose lovenox with thrombosis and malignancy   Severe Protein Calorie Malnutrition  Suspected Starvation Ketosis  -begin TF per nutrition  -monitor for refeeding syndrome  -prosource supplement  -MVI   CAD, HTN, HLD  -continue ASA  DM II  -hold metformin  -SSI   GERD Gastroparesis   -PPI   Best Practice (right click and "Reselect all SmartList Selections" daily)  Diet/type: tubefeeds DVT prophylaxis: systemic dose LMWH GI prophylaxis: PPI Lines: N/A Foley:  N/A Code Status:  full code Last date of multidisciplinary goals of care discussion: wife indicates the patient stated "I'm not done" and he would want aggressive care at least in the short term.  Full code for now.   Labs   CBC: Recent Labs  Lab 09/18/21 0253  WBC 14.8*  HGB 9.3*  HCT 27.8*  MCV 92.1  PLT 481*    Basic Metabolic Panel: Recent Labs  Lab 09/18/21 0253 09/19/21 0103 09/21/21 0957  NA  --  132* 138  K  --  4.9 5.2*  CL  --  104 108  CO2  --  15* 11*  GLUCOSE  --  128* 102*  BUN  --  12 21*  CREATININE 0.50* 0.57* 0.50*  CALCIUM  --  6.9* 7.5*   GFR: Estimated Creatinine Clearance: 109.8 mL/min (A) (by C-G formula based on SCr of 0.5 mg/dL (L)). Recent Labs  Lab 09/18/21 0253  WBC 14.8*    Liver Function Tests: No results for input(s): "AST", "ALT", "ALKPHOS", "BILITOT", "PROT", "ALBUMIN" in the last 168 hours. No results for input(s): "LIPASE", "AMYLASE" in the last 168 hours. No results for input(s): "AMMONIA" in the last 168 hours.  ABG    Component Value Date/Time   HCO3 23.2 08/30/2021 2049   ACIDBASEDEF 0.6 08/30/2021 2049   O2SAT 98.6 08/30/2021 2049     Coagulation  Profile: No results for input(s): "INR", "PROTIME" in the last 168 hours.  Cardiac Enzymes: No results for input(s): "CKTOTAL", "CKMB", "CKMBINDEX", "TROPONINI" in the last 168 hours.  HbA1C: Hgb A1c MFr Bld  Date/Time Value Ref Range Status  08/25/2021 05:30 PM 7.1 (H) 4.8 - 5.6 % Final    Comment:    (NOTE) Pre diabetes:          5.7%-6.4%  Diabetes:              >6.4%  Glycemic control for   <7.0% adults with diabetes     CBG: Recent Labs  Lab 08/25/2021 1602 09/11/2021 1918 09/21/21 0019 09/21/21 0337 09/21/21 0814  GLUCAP 105* 123* 119* 111* 100*    Review of Systems:   Unable to complete as patient is altered. Information obtained from wife at bedside, staff and chart review.  Past Medical History:  He,  has a past medical  history of Chronic back pain, Chronic GERD, Chronic pain, COPD (chronic obstructive pulmonary disease) ([place]), Coronary artery disease, Depression, Diabetes mellitus without complication ([place]), Gastroparesis, Goals of care, counseling/discussion (01/27/2021), High risk medication use, Hyperlipidemia, Lung cancer ([place]), Metastasis to liver [facility]), Metastatic cancer to spine [facility]), MI (myocardial infarction) ([place]) (2003), Non-small cell lung cancer metastatic to brain ([place]) (01/27/2021), Portal vein thrombosis, and Restless leg syndrome.   Surgical History:   Past Surgical History:  Procedure Laterality Date   BACK SURGERY     CARDIAC CATHETERIZATION     IR FLUORO GUIDED NEEDLE PLC ASPIRATION/INJECTION LOC  08/30/2021   IR RADIOLOGIST EVAL & MGMT  09/13/2021   LUMBAR LAMINECTOMY/DECOMPRESSION MICRODISCECTOMY Right 09/13/2021   Procedure: Right Lumbar four-five Microdiscectomy, Right Lumbar three-four;  Surgeon: Ashok Pall, MD;  Location: [place];  Service: Neurosurgery;  Laterality: Right;     Social History:   reports that he has been smoking cigarettes. He has a 40.00 pack-year smoking history. He has never used smokeless tobacco. He reports that he  does not currently use alcohol. He reports that he does not currently use drugs.   Family History:  His family history is not on file.   Allergies Allergies  Allergen Reactions   Keppra [Levetiracetam] Other (See Comments)    Personality changes   Lyrica [Pregabalin] Swelling    Leg swelling    Neurontin [Gabapentin] Swelling    Leg swelling    Compazine [Prochlorperazine] Nausea And Vomiting   Duloxetine Other (See Comments)    Excessive sleeping   Other Other (See Comments)    Antibiotics - per wife at bedside, patient will develop C-diff if given "any antibiotic"   Wellbutrin [Bupropion] Nausea And Vomiting   Zetia [Ezetimibe] Other (See Comments)    Unknown reaction   Lamictal [Lamotrigine] Itching and Rash     Home Medications  Prior to Admission medications   Medication Sig Start Date End Date Taking? Authorizing Provider  acetaminophen (TYLENOL) 500 MG tablet Take 1,000 mg by mouth every 6 (six) hours as needed (pain).   Yes [provider]  albuterol (PROVENTIL) (2.5 MG/3ML) 0.083% nebulizer solution Take 2.5 mg by nebulization daily as needed for shortness of breath or wheezing. 01/25/21  Yes [provider]  albuterol (VENTOLIN HFA) 108 (90 Base) MCG/ACT inhaler Inhale 2 puffs into the lungs every 4 (four) hours as needed for wheezing or shortness of breath. 07/14/21  Yes Pickenpack-Cousar, Carlena Sax, NP  aspirin EC 81 MG tablet Take 81 mg by mouth in the morning.   Yes [provider]  budesonide-formoterol (SYMBICORT) 160-4.5 MCG/ACT inhaler Inhale 2 puffs into the lungs 2 (two) times daily. 07/14/21  Yes Pickenpack-Cousar, Carlena Sax, NP  cyclobenzaprine (FLEXERIL) 5 MG tablet Take 1 tablet (5 mg total) by mouth 3 (three) times daily as needed for muscle spasms. 09/03/21  Yes Regalado, Belkys A, MD  dronabinol (MARINOL) 5 MG capsule Take 1 capsule (5 mg total) by mouth 2 (two) times daily before lunch and supper. 09/03/21  Yes Regalado, Belkys A, MD   escitalopram (LEXAPRO) 5 MG tablet Take 5 mg by mouth in the morning.   Yes [provider]  HYDROmorphone (DILAUDID) 4 MG tablet Take 1 tablet (4 mg total) by mouth every 4 (four) hours as needed for severe pain. 09/14/21  Yes Ennever, Rudell Cobb, MD  ketorolac (TORADOL) 10 MG tablet Take 1 tablet (10 mg total) by mouth every 6 (six) hours as needed for moderate pain. 09/08/21  Yes Pickenpack-Cousar,  Carlena Sax, NP  LORazepam (ATIVAN) 1 MG tablet Take 1 tablet (1 mg total) by mouth every 8 (eight) hours as needed for anxiety. 09/14/21  Yes [name], MD  metFORMIN (GLUCOPHAGE) 500 MG tablet Take 500 mg by mouth daily.   Yes [provider]  montelukast (SINGULAIR) 10 MG tablet Take 10 mg by mouth at bedtime.   Yes [provider]  Multiple Vitamin (MULTIVITAMIN) capsule Take 1 capsule by mouth in the morning.   Yes [provider]  naloxone (NARCAN) nasal spray 4 mg/0.1 mL Place 1 spray into the nose as needed (accidental overdose). 12/05/20  Yes [provider]  pantoprazole (PROTONIX) 40 MG tablet Take 40 mg by mouth in the morning.   Yes [provider]  Rivaroxaban (XARELTO) 15 MG TABS tablet Take 1 tablet (15 mg total) by mouth 2 (two) times daily with a meal for 17 days. 09/03/21 09/20/21 Yes Regalado, Belkys A, MD  Blood Glucose Monitoring Suppl (ACCU-CHEK GUIDE) w/Device KIT USE AS DIRECTED TO CHECK BLOOD SUGAR Patient not taking: Reported on 07/26/2021 11/20/20   [provider]  Blood Glucose Monitoring Suppl (GLUCOCOM BLOOD GLUCOSE MONITOR) [place]  11/20/20   [provider]  cetirizine (ZYRTEC) 10 MG tablet Take 20 mg by mouth daily as needed for allergies. 09/10/21   [provider]  ondansetron (ZOFRAN) 8 MG tablet Take 1 tablet (8 mg total) by mouth every 8 (eight) hours as needed for nausea or vomiting. 09/07/21   Pickenpack-Cousar, Carlena Sax, NP  Oxycodone HCl 10 MG TABS Take 1 tablet (10 mg total) by mouth every 6  (six) hours as needed. Patient not taking: Reported on 09/14/2021 09/08/21   Pickenpack-Cousar, Carlena Sax, NP  polyethylene glycol (MIRALAX / GLYCOLAX) 17 g packet Take 17 g by mouth daily. Patient not taking: Reported on 08/13/2021    [provider]  senna-docusate (SENOKOT-S) 8.6-50 MG tablet Take 2 tablets by mouth 2 (two) times daily. Patient taking differently: Take 2 tablets by mouth 2 (two) times daily as needed (constipation.). 07/26/21   [name], MD     Critical care time: 102 minutes    Noe Gens, MSN, APRN, NP-C, AGACNP-BC [facility] 09/22/2021, 9:42 AM   Please see [email or web address] for pager details.   From 7A-7P if no response, please call [phone] After hours, please call ELink [phone]

## 2021-09-22 NOTE — Procedures (Signed)
Cortrak  Person Inserting Tube:  Alroy Dust, Jazmyne Beauchesne L, RD Tube Type:  Cortrak - 43 inches Tube Size:  10 Tube Location:  Left nare Secured by: Bridle Technique Used to Measure Tube Placement:  Marking at nare/corner of mouth Cortrak Secured At:  80 cm   Cortrak Tube Team Note:  Consult received to place a Cortrak feeding tube.   X-ray is required, abdominal x-ray has been ordered by the Cortrak team. Please confirm tube placement before using the Cortrak tube.   If the tube becomes dislodged please keep the tube and contact the Cortrak team at [email or web address] (password [email or web address]) for replacement.  If after hours and replacement cannot be delayed, place a NG tube and confirm placement with an abdominal x-ray.    Hermina Barters RD, LDN Clinical Dietitian See Shea Evans for contact information.

## 2021-09-22 NOTE — Progress Notes (Signed)
Patient ID: Bill Garza, male   DOB: June 21, 1960, 61 y.o.   MRN: [id number] BP 101/66   Pulse (!) 111   Temp 100.3 F (37.9 C) (Axillary)   Resp (!) 28   Ht 6' (1.829 m)   Wt 77.1 kg   SpO2 100%   BMI 23.06 kg/m  Intubated earlier this morning due to rapid shallow breathing He was not responsive, only moaning.  Significant acidosis.  Currently sedated. Feeding tube placed Incision is clean and dry Prognosis is guarded Moving all extremities prior to intubation.

## 2021-09-22 NOTE — Procedures (Signed)
Intubation Procedure Note  Bill Garza  [id number]  29-Nov-1960  Date:09/22/21  Time:10:55 AM   Provider Performing:Porfirio Bollier R Lecretia Buczek    Procedure: Intubation (43539)  Indication(s) Respiratory Failure  Consent Risks of the procedure as well as the alternatives and risks of each were explained to the patient and/or caregiver.  Consent for the procedure was obtained and is signed in the bedside chart   Anesthesia Etomidate, Versed, Fentanyl, and Rocuronium   Time Out Verified patient identification, verified procedure, site/side was marked, verified correct patient position, special equipment/implants available, medications/allergies/relevant history reviewed, required imaging and test results available.   Sterile Technique Usual hand hygeine, masks, and gloves were used   Procedure Description Patient positioned in bed supine.  Sedation given as noted above.  Patient was intubated with endotracheal tube using Glidescope.  View was Grade 2 only posterior commissure .  Number of attempts was 1.  Colorimetric CO2 detector was consistent with tracheal placement.   Complications/Tolerance None; patient tolerated the procedure well. Chest X-ray is ordered to verify placement.   EBL none   Specimen(s) None

## 2021-09-22 NOTE — Significant Event (Signed)
Rapid Response Event Note   Reason for Call :  Called by RN for tachypnea in the 40s.   Initial Focused Assessment:  On arrival, pt sitting up in bed moaning out with increased work of breathing. RR 41, spO2 96% on RA, HR 125, BP 162/98.   Lungs with rhonchi/rales and accessory muscle use noted. Mottling to Bilateral knees. Pt unable to speak and minimally follow commands. He does open his eyes and respond to voice and track. Per family at bedside he has been like this since yesterday morning with decreased mentation.   CCM consulted and to bedside.   Interventions:  CXR CCM consulted Abg Transfer to ICU  Plan of Care:  Pt transferred to 4N25   Event Summary:   MD Notified: CCM- Noe Gens NP Call Time: [id number] Arrival Time: 3838 End Time: 1021  Sherilyn Dacosta, RN

## 2021-09-22 NOTE — Progress Notes (Signed)
I was called by Dr. Christella Noa today about the fact that Mr. Bill Garza was intubated.  He was not eating.  He was calling out for pain medication so frequently that he would not eat.  I saw him on Sunday.  He had surgery.  The nerve root was decompressed.  As such, I would think the only pain that he would have would be postoperative pain.  Hopefully, being intubated will help break the cycle and he will not require much in way of pain medication.  He does have metastatic lung cancer.  He does have liver metastasis.  I just am worried that he is not going to be in good enough shape to be able to tolerate any type of therapy for his lung cancer..  His overall performance status is not good.  I would say that his performance status is probably ECOG 3.  I really thought that once he had surgery, he would really do a lot better and that he would be more active.  At least, he is on tube feeds right now.  This will hopefully help give him some nutrition.  I saw that a blood gas was somewhat acidotic with a pH of 7.12.  His lab work showed a BUN of 25 creatinine 0.79.  Calcium is 7.3 with an albumin of 1.5.  Has a markedly elevated alkaline phosphatase of 1019.  His total bilirubin is 2.5.  SGPT is 95 SGOT 357.  Pharmacy is doing a good job and try to manage his electrolyte abnormalities.  His CBC shows white count 11.3.  Hemoglobin 9.3.  Platelet count 465,000.  He is intubated.  He is sedated.  Again hopefully this will help break this cycle of pain requirement for him.  Clearly, nutrition is going to be critical.  I will probably check a prealbumin on him and we will see how this is.  Again, even if he comes through this, I am just concerned that we are not can be able to give him treatment for the metastatic lung cancer.  He really has to improve his performance status if we are ever going to be able to treat him.  His chest x-ray shows that there may be a infiltrate in the left lung.  He is on  antibiotics.  I think he was a fantastic idea to intubate him.  Again, this will allow his body time to rest so he will be able to gain some more nutrition and hopefully improve his status.  More importantly, hopefully he will not feel the need to have as much pain medicine.  We will follow along.  Anything that we can do to try to help out the situation we will be more than happy to.  I know that he is in great hands with all the staff up on 4N.  I appreciate all of their help in his situation that is very complex.  Lattie Haw, MD  [place] [phone]

## 2021-09-22 NOTE — Progress Notes (Signed)
Pharmacy Electrolyte Replacement  Recent Labs:  Recent Labs    09/22/21 1036  K 5.0  MG 2.1  PHOS 1.9*  CREATININE 0.79    Low Critical Values (K </= 2.5, Phos </= 1, Mg </= 1) Present: None  MD Contacted: n/a - no critical values noted  Plan: Give NaPhos 38mmol IV x 1 Recheck in AM per protocol   Arturo Morton, PharmD, BCPS Please check AMION for all [place] contact numbers Clinical Pharmacist 09/22/2021 3:02 PM

## 2021-09-23 ENCOUNTER — Inpatient Hospital Stay (HOSPITAL_COMMUNITY): Payer: Medicare HMO

## 2021-09-23 DIAGNOSIS — C787 Secondary malignant neoplasm of liver and intrahepatic bile duct: Secondary | ICD-10-CM | POA: Diagnosis not present

## 2021-09-23 DIAGNOSIS — M48061 Spinal stenosis, lumbar region without neurogenic claudication: Secondary | ICD-10-CM | POA: Diagnosis not present

## 2021-09-23 DIAGNOSIS — J9600 Acute respiratory failure, unspecified whether with hypoxia or hypercapnia: Secondary | ICD-10-CM | POA: Diagnosis not present

## 2021-09-23 DIAGNOSIS — E43 Unspecified severe protein-calorie malnutrition: Secondary | ICD-10-CM

## 2021-09-23 LAB — GLUCOSE, CAPILLARY
Glucose-Capillary: 143 mg/dL — ABNORMAL HIGH (ref 70–99)
Glucose-Capillary: 84 mg/dL (ref 70–99)
Glucose-Capillary: 112 mg/dL — ABNORMAL HIGH (ref 70–99)
Glucose-Capillary: 129 mg/dL — ABNORMAL HIGH (ref 70–99)
Glucose-Capillary: 162 mg/dL — ABNORMAL HIGH (ref 70–99)
Glucose-Capillary: 169 mg/dL — ABNORMAL HIGH (ref 70–99)

## 2021-09-23 LAB — CBC
HCT: 28.9 % — ABNORMAL LOW (ref 39.0–52.0)
Hemoglobin: 9 g/dL — ABNORMAL LOW (ref 13.0–17.0)
MCH: 29.6 pg (ref 26.0–34.0)
MCHC: 31.1 g/dL (ref 30.0–36.0)
MCV: 95.1 fL (ref 80.0–100.0)
Platelets: 394 10*3/uL (ref 150–400)
RBC: 3.04 MIL/uL — ABNORMAL LOW (ref 4.22–5.81)
RDW: 20.5 % — ABNORMAL HIGH (ref 11.5–15.5)
WBC: 13.7 10*3/uL — ABNORMAL HIGH (ref 4.0–10.5)
nRBC: 0.4 % — ABNORMAL HIGH (ref 0.0–0.2)

## 2021-09-23 LAB — URINALYSIS, ROUTINE W REFLEX MICROSCOPIC
Bilirubin Urine: NEGATIVE
Glucose, UA: NEGATIVE mg/dL
Ketones, ur: 20 mg/dL — AB
Leukocytes,Ua: NEGATIVE
Nitrite: NEGATIVE
Protein, ur: 30 mg/dL — AB
RBC / HPF: >50 RBC/hpf — ABNORMAL HIGH (ref 0–5)
Specific Gravity, Urine: 1.021 (ref 1.005–1.030)
pH: 5 (ref 5.0–8.0)

## 2021-09-23 LAB — POCT I-STAT 7, (LYTES, BLD GAS, ICA,H+H)
Acid-base deficit: 9 mmol/L — ABNORMAL HIGH (ref 0.0–2.0)
Acid-base deficit: 6 mmol/L — ABNORMAL HIGH (ref 0.0–2.0)
Bicarbonate: 15.6 mmol/L — ABNORMAL LOW (ref 20.0–28.0)
Bicarbonate: 17.7 mmol/L — ABNORMAL LOW (ref 20.0–28.0)
Calcium, Ion: 1.02 mmol/L — ABNORMAL LOW (ref 1.15–1.40)
Calcium, Ion: 1.18 mmol/L (ref 1.15–1.40)
HCT: 24 % — ABNORMAL LOW (ref 39.0–52.0)
HCT: 28 % — ABNORMAL LOW (ref 39.0–52.0)
Hemoglobin: 8.2 g/dL — ABNORMAL LOW (ref 13.0–17.0)
Hemoglobin: 9.5 g/dL — ABNORMAL LOW (ref 13.0–17.0)
O2 Saturation: 97 %
O2 Saturation: 99 %
Patient temperature: 102.5
Patient temperature: 97.9
Potassium: 4 mmol/L (ref 3.5–5.1)
Potassium: 4 mmol/L (ref 3.5–5.1)
Sodium: 141 mmol/L (ref 135–145)
Sodium: 137 mmol/L (ref 135–145)
TCO2: 16 mmol/L — ABNORMAL LOW (ref 22–32)
TCO2: 19 mmol/L — ABNORMAL LOW (ref 22–32)
pCO2 arterial: 30.5 mmHg — ABNORMAL LOW (ref 32–48)
pCO2 arterial: 28.6 mmHg — ABNORMAL LOW (ref 32–48)
pH, Arterial: 7.327 — ABNORMAL LOW (ref 7.35–7.45)
pH, Arterial: 7.399 (ref 7.35–7.45)
pO2, Arterial: 106 mmHg (ref 83–108)
pO2, Arterial: 137 mmHg — ABNORMAL HIGH (ref 83–108)

## 2021-09-23 LAB — COMPREHENSIVE METABOLIC PANEL
ALT: 95 U/L — ABNORMAL HIGH (ref 0–44)
AST: 336 U/L — ABNORMAL HIGH (ref 15–41)
Albumin: <1.5 g/dL — ABNORMAL LOW (ref 3.5–5.0)
Alkaline Phosphatase: 1082 U/L — ABNORMAL HIGH (ref 38–126)
Anion gap: 14 (ref 5–15)
BUN: 42 mg/dL — ABNORMAL HIGH (ref 6–20)
CO2: 16 mmol/L — ABNORMAL LOW (ref 22–32)
Calcium: 7.1 mg/dL — ABNORMAL LOW (ref 8.9–10.3)
Chloride: 113 mmol/L — ABNORMAL HIGH (ref 98–111)
Creatinine, Ser: 0.6 mg/dL — ABNORMAL LOW (ref 0.61–1.24)
GFR, Estimated: >60 mL/min (ref >60)
Glucose, Bld: 154 mg/dL — ABNORMAL HIGH (ref 70–99)
Potassium: 3.9 mmol/L (ref 3.5–5.1)
Sodium: 143 mmol/L (ref 135–145)
Total Bilirubin: 1.7 mg/dL — ABNORMAL HIGH (ref 0.3–1.2)
Total Protein: 4.9 g/dL — ABNORMAL LOW (ref 6.5–8.1)

## 2021-09-23 LAB — PHOSPHORUS
Phosphorus: 2.7 mg/dL (ref 2.5–4.6)
Phosphorus: 2 mg/dL — ABNORMAL LOW (ref 2.5–4.6)

## 2021-09-23 LAB — MAGNESIUM
Magnesium: 2 mg/dL (ref 1.7–2.4)
Magnesium: 2.3 mg/dL (ref 1.7–2.4)

## 2021-09-23 MED ORDER — CALCIUM GLUCONATE-NACL 2-0.675 GM/100ML-% IV SOLN
2.0000 g | Freq: Once | INTRAVENOUS | Status: AC
Start: 2021-09-23 — End: 2021-09-23
  Administered 2021-09-23: 2000 mg via INTRAVENOUS
  Filled 2021-09-23: qty 100

## 2021-09-23 MED ORDER — ENOXAPARIN SODIUM 80 MG/0.8ML IJ SOSY
1.0000 mg/kg | PREFILLED_SYRINGE | Freq: Two times a day (BID) | INTRAMUSCULAR | Status: DC
Start: 2021-09-23 — End: 2021-09-27
  Administered 2021-09-23 – 2021-09-26 (×7): 75 mg via SUBCUTANEOUS
  Filled 2021-09-23 (×8): qty 0.75

## 2021-09-23 NOTE — Progress Notes (Signed)
OT Cancellation Note  Patient Details Name: Sufyan Meidinger MRN: [id number] DOB: 16-Apr-1960   Cancelled Treatment:    Reason Eval/Treat Not Completed: Patient at procedure or test/ unavailable.  Patient off the floor for imaging.    Beauden Tremont D Sully Dyment 09/23/2021, 10:28 AM 09/23/2021  RP, OTR/L  Acute Rehabilitation Services  Office:  [phone]

## 2021-09-23 NOTE — Progress Notes (Signed)
[place] Progress Note Patient Name: Bill Garza DOB: [birth year] MRN: [id number]   Date of Service  09/23/2021  HPI/Events of Note  Multiple issues: 1. Fever to 102.4 F. Currently on Vancomysin and Cefepime empirically. No blood cultures this admission. 2. Last VBG with pH = 7.12.  eICU Interventions  Plan: Blood cultures X 2 now. ABG now.      Intervention Category Major Interventions: Other:;Respiratory failure - evaluation and management  [name] 09/23/2021, 12:24 AM

## 2021-09-23 NOTE — Progress Notes (Signed)
NAME:  Bill Garza, MRN:  [id number], DOB:  02-01-1961, LOS: 6 ADMISSION DATE:  08/26/2021, CONSULTATION DATE:  7/5 REFERRING MD:  Dr. Christella Noa, CHIEF COMPLAINT:  SOB, AMS    History of Present Illness:  61 y/o M who was admitted on 6/30 for planned discectomy.    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 He is followed by [name] for Oncology.  Currently it appears that he is on Zometa, next dose planned 11/07/2021.  At his last office visit his ECOG score was estimated to be 2.  He has had recent admission in May for intractable pain to [facility] where he required nerve block.  In addition he had an admission June 7 for pain at [facility].  He has limited PO intake with weight loss and has been on marinol for appetite stimulant.   Patient was admitted on 6/30 in the setting of cancer related chronic back pain for planned discectomy of L3-4, 4-5.  Imaging on admission revealed new spinal lesions.  Postoperative course complicated by difficult to control pain.  Per wife, the patient developed increasing shortness of breath on 7/4, moaning and increased pain.  Shortness of breath increased on a.m. of 7/5 as well as altered mental status.  Rapid response team called for evaluation.  Chest x-ray ordered which revealed mild bronchitic changes in the right lower lobe.  No acute infiltrate.  BMP showed anion gap metabolic acidosis with notable drop in CO2 from previous day labs.  PCCM consulted for evaluation.   Pertinent  Medical History  NSCLC with metastatic disease to brain, liver, spine.  S/p SRS, 1 cycle of adjuvant chemotherapy with cisplatin, Keytruda, Alimta.  Additional left occipital brain lesion resection with metastatic poorly differentiated carcinoma. VATS resection 08/2020 Portal Vein Thrombosis  COPD  Tobacco Abuse-quit smoking approximately 13 years ago, 40-pack-year history CAD s/p MI  HLD HLD Depression / Anxiety   Chronic Pain   Significant Hospital Events: Including procedures, antibiotic start and stop dates in addition to other pertinent events   6/30 Admit for planned discectomy 7/5 Tx to ICU for resp distress, intubated  Interim History / Subjective:  Sedation was turned off, patient mental status remain poor He remains on mechanical ventilator, tolerating spontaneous breathing trial Spiked fever of 102.5  Objective   Blood pressure 124/90, pulse (!) 123, temperature (!) 97.5 F (36.4 C), temperature source Axillary, resp. rate 14, height 6' (1.829 m), weight 75.8 kg, SpO2 100 %.    Vent Mode: PSV;CPAP FiO2 (%):  [40 %] 40 % Set Rate:  [28 bmp] 28 bmp Vt Set:  [720 mL] 720 mL PEEP:  [5 cmH20] 5 cmH20 Pressure Support:  [10 cmH20] 10 cmH20 Plateau Pressure:  [21 cmH20-23 cmH20] 23 cmH20   Intake/Output Summary (Last 24 hours) at 09/23/2021 1023 Last data filed at 09/23/2021 1000 Gross per 24 hour  Intake 1848.09 ml  Output 700 ml  Net 1148.09 ml   Filed Weights   09/16/21 1403 08/31/2021 1351 09/23/21 0341  Weight: 72.1 kg 77.1 kg 75.8 kg    Examination:   Physical exam: General: Crtitically ill-appearing male, orally intubated HEENT: [place]/AT, eyes anicteric.  ETT and OGT in place Neuro: Eyes open, not following commands, remain lethargic, withdrawing to painful stimuli Chest: Bilateral basal crackles right more than left, no wheezes or rhonchi Heart: Regular rate and rhythm, no murmurs or gallops Abdomen: Soft, nontender, nondistended, bowel sounds present Skin:  No rash  Resolved Hospital Problem list     Assessment & Plan:  Severe anion gap metabolic acidosis with partial respiratory compensation related to Starvation Ketosis  Acute respiratory failure with hypoxia Aspiration pneumonia Patient metabolic acidosis has improved, serum bicarbonate trended up from 10-16 He has respiratory compensation pH has normalized with PCO2 28 Patient is tolerating spontaneous breathing  trial but mental status remain poor, precluding extubation He spiked 102.5 fever overnight MRSA screen is positive Continue IV vancomycin and cefepime Follow-up cultures Trend ABGs Lactate remain at 2  Acute Metabolic Encephalopathy in the setting of severe acidosis Patient is off sedation Still mental status remain poor Hold sedation with RASS goal 0 We will get CT scan head to rule out structural disease  NSCLC with Metastatic Disease to Liver, Spine & Brain  Chronic Cancer Related Pain  Progression of disease on most recent imaging as of 08/25/21 Continue supportive care  Currently not a candidate for any therapy given clinical status  PRN flexeril for muscle spasms  Appreciate oncology follow-up -bowel regimen with narcotics   Portal Vein Thrombosis  Continue full dose lovenox with thrombosis and malignancy   Severe Protein Calorie Malnutrition  Suspected Starvation Ketosis  Continue tube feeds Monitor for refeeding syndrome  Dietitian following Continue multivitamins  CAD, HTN, HLD  Continue ASA  DM II  Continue to hold metformin Fingerstick remained stable Continue sliding scale insulin with CBG goal 140-180  GERD Gastroparesis   PPI   Best Practice (right click and "Reselect all SmartList Selections" daily)  Diet/type: tubefeeds DVT prophylaxis: systemic dose LMWH GI prophylaxis: PPI Lines: N/A Foley:  N/A Code Status:  full code Last date of multidisciplinary goals of care discussion: wife indicates the patient stated "I'm not done" and he would want aggressive care at least in the short term.  Full code for now.   Labs   CBC: Recent Labs  Lab 09/18/21 0253 09/22/21 1036 09/23/21 0123 09/23/21 0412 09/23/21 0832  WBC 14.8* 11.3*  --  13.7*  --   HGB 9.3* 9.3* 8.2* 9.0* 9.5*  HCT 27.8* 30.7* 24.0* 28.9* 28.0*  MCV 92.1 98.4  --  95.1  --   PLT 481* 465*  --  394  --     Basic Metabolic Panel: Recent Labs  Lab 09/18/21 0253 09/19/21 0103  09/19/21 0103 09/21/21 0957 09/22/21 1036 09/22/21 1631 09/23/21 0123 09/23/21 0412 09/23/21 0832  NA  --  132*   < > 138 141  --  141 143 137  K  --  4.9   < > 5.2* 5.0  --  4.0 3.9 4.0  CL  --  104  --  108 113*  --   --  113*  --   CO2  --  15*  --  11* 10*  --   --  16*  --   GLUCOSE  --  128*  --  102* 131*  --   --  154*  --   BUN  --  12  --  21* 25*  --   --  42*  --   CREATININE 0.50* 0.57*  --  0.50* 0.79  --   --  0.60*  --   CALCIUM  --  6.9*  --  7.5* 7.3*  --   --  7.1*  --   MG  --   --   --   --  2.1 2.1  --  2.0  --   PHOS  --   --   --   --  1.9* 1.8*  --  2.7  --    < > = values in this interval not displayed.   GFR: Estimated Creatinine Clearance: 107.9 mL/min (A) (by C-G formula based on SCr of 0.6 mg/dL (L)). Recent Labs  Lab 09/18/21 0253 09/22/21 1036 09/23/21 0412  WBC 14.8* 11.3* 13.7*  LATICACIDVEN  --  2.2*  --     Liver Function Tests: Recent Labs  Lab 09/22/21 1036 09/23/21 0412  AST 357* 336*  ALT 95* 95*  ALKPHOS 1,019* 1,082*  BILITOT 2.5* 1.7*  PROT 5.1* 4.9*  ALBUMIN 1.5* <1.5*   No results for input(s): "LIPASE", "AMYLASE" in the last 168 hours. No results for input(s): "AMMONIA" in the last 168 hours.  ABG    Component Value Date/Time   PHART 7.399 09/23/2021 0832   PCO2ART 28.6 (L) 09/23/2021 0832   PO2ART 137 (H) 09/23/2021 0832   HCO3 17.7 (L) 09/23/2021 0832   TCO2 19 (L) 09/23/2021 0832   ACIDBASEDEF 6.0 (H) 09/23/2021 0832   O2SAT 99 09/23/2021 0832     Coagulation Profile: No results for input(s): "INR", "PROTIME" in the last 168 hours.  Cardiac Enzymes: No results for input(s): "CKTOTAL", "CKMB", "CKMBINDEX", "TROPONINI" in the last 168 hours.  HbA1C: Hgb A1c MFr Bld  Date/Time Value Ref Range Status  08/25/2021 05:30 PM 7.1 (H) 4.8 - 5.6 % Final    Comment:    (NOTE) Pre diabetes:          5.7%-6.4%  Diabetes:              >6.4%  Glycemic control for   <7.0% adults with diabetes      CBG: Recent Labs  Lab 09/22/21 1531 09/22/21 1939 09/22/21 2322 09/23/21 0331 09/23/21 0736  GLUCAP 129* 105* 166* 143* 84   Total critical care time: 39 minutes  Performed by: [place] care time was exclusive of separately billable procedures and treating other patients.   Critical care was necessary to treat or prevent imminent or life-threatening deterioration.   Critical care was time spent personally by me on the following activities: development of treatment plan with patient and/or surrogate as well as nursing, discussions with consultants, evaluation of patient's response to treatment, examination of patient, obtaining history from patient or surrogate, ordering and performing treatments and interventions, ordering and review of laboratory studies, ordering and review of radiographic studies, pulse oximetry and re-evaluation of patient's condition.   Jacky Kindle, MD [facility] See Amion for pager If no response to pager, please call [phone] until 7pm After 7pm, Please call E-link [phone]

## 2021-09-23 NOTE — Progress Notes (Signed)
ANTICOAGULATION CONSULT NOTE  Pharmacy Consult for Lovenox Indication:  hx portal vein thrombosis   Labs: Recent Labs    09/21/21 0957 09/22/21 1036 09/22/21 1036 09/23/21 0123 09/23/21 0412 09/23/21 0832  HGB  --  9.3*   < > 8.2* 9.0* 9.5*  HCT  --  30.7*   < > 24.0* 28.9* 28.0*  PLT  --  465*  --   --  394  --   CREATININE 0.50* 0.79  --   --  0.60*  --    < > = values in this interval not displayed.     Assessment: 68 yom on Xarelto PTA for hx of portal vein thrombosis, held since admission with patient s/p microdiscectomy on 6/30. Heparin [place] prophylactic dosing resumed 7/1. Patient transferred to ICU and urgently intubated 7/5 AM with respiratory distress. Pharmacy consulted to transition to Lovenox treatment dose for now. Hg low but stable at 9.5, plt previously elevated, now down to 394. SCr stable WNL. No bleed issues reported. Weight today down to 75.8 kg.  Goal of Therapy:  Anti-Xa level 0.6-1 units/ml 4hrs after LMWH dose given Monitor platelets by anticoagulation protocol: Yes   Plan:  Adjust Lovenox to 75mg  (1mg /kg) [place] q12h Monitor CBC, SCr, weight changes, for s/sx bleeding   Arturo Morton, PharmD, BCPS Clinical Pharmacist 09/23/2021 11:56 AM

## 2021-09-23 NOTE — Progress Notes (Addendum)
[facility] Progress Note Patient Name: Bill Garza DOB: 1960/08/30 MRN: [id number]   Date of Service  09/23/2021  HPI/Events of Note  Multiple issues: 1. Calcium = 7.1 which corrects to 9.1 (Normal) given albumin <  1.5. 2. AST = 336 and ALT - 95. Patient is currently on Tylenol.   eICU Interventions  Plan: No indication for Ca++ replacement.  D/C Tylenol.     Intervention Category Major Interventions: Electrolyte abnormality - evaluation and management  Bill Garza 09/23/2021, 6:33 AM

## 2021-09-23 NOTE — Progress Notes (Signed)
Patient ID: Bill Garza, male   DOB: 10/31/1960, 61 y.o.   MRN: [id number] BP 94/74   Pulse (!) 133   Temp (!) 100.5 F (38.1 C) (Axillary)   Resp (!) 27   Ht 6' (1.829 m)   Wt 75.8 kg   SpO2 100%   BMI 22.66 kg/m  Sedated, will respond to oral care, closes eyes with a bright light Little spontaneous movement Was being weaned earlier today and did well.  No expectation of extubation in next 24 hours, thus dilaudid drip is infusing . Dose is low.  Metabolic improvement is significant. Head CT unremarkable.

## 2021-09-23 NOTE — Progress Notes (Signed)
[facility] Progress Note Patient Name: Bill Garza DOB: [birth year] MRN: [id number]   Date of Service  09/23/2021  HPI/Events of Note  Multiple issues: 1. ABG on 40%/PRVC 28/TV 720/P 5 = 7.327/30.5/106/15.6 and 2. Ionized Ca++ = 1.02.  eICU Interventions  Plan: Replace Ca++. Continue present ventilator management.      Intervention Category Major Interventions: Respiratory failure - evaluation and management;Electrolyte abnormality - evaluation and management  Endrit Gittins [place] 09/23/2021, 4:40 AM

## 2021-09-23 NOTE — Progress Notes (Signed)
Pt transported on vent from 4N25 to CT and back without any complications. RN at bedside, RT will monitor.

## 2021-09-24 ENCOUNTER — Inpatient Hospital Stay (HOSPITAL_COMMUNITY): Payer: Medicare HMO

## 2021-09-24 DIAGNOSIS — C787 Secondary malignant neoplasm of liver and intrahepatic bile duct: Secondary | ICD-10-CM | POA: Diagnosis not present

## 2021-09-24 DIAGNOSIS — J9600 Acute respiratory failure, unspecified whether with hypoxia or hypercapnia: Secondary | ICD-10-CM | POA: Diagnosis not present

## 2021-09-24 DIAGNOSIS — M48061 Spinal stenosis, lumbar region without neurogenic claudication: Secondary | ICD-10-CM | POA: Diagnosis not present

## 2021-09-24 DIAGNOSIS — E43 Unspecified severe protein-calorie malnutrition: Secondary | ICD-10-CM | POA: Diagnosis not present

## 2021-09-24 LAB — PHOSPHORUS
Phosphorus: <1 mg/dL — CL (ref 2.5–4.6)
Phosphorus: 3.5 mg/dL (ref 2.5–4.6)

## 2021-09-24 LAB — COMPREHENSIVE METABOLIC PANEL
ALT: 73 U/L — ABNORMAL HIGH (ref 0–44)
AST: 200 U/L — ABNORMAL HIGH (ref 15–41)
Albumin: <1.5 g/dL — ABNORMAL LOW (ref 3.5–5.0)
Alkaline Phosphatase: 655 U/L — ABNORMAL HIGH (ref 38–126)
Anion gap: 17 — ABNORMAL HIGH (ref 5–15)
BUN: 50 mg/dL — ABNORMAL HIGH (ref 6–20)
CO2: 12 mmol/L — ABNORMAL LOW (ref 22–32)
Calcium: 6.3 mg/dL — CL (ref 8.9–10.3)
Chloride: 115 mmol/L — ABNORMAL HIGH (ref 98–111)
Creatinine, Ser: 0.52 mg/dL — ABNORMAL LOW (ref 0.61–1.24)
GFR, Estimated: >60 mL/min (ref >60)
Glucose, Bld: 203 mg/dL — ABNORMAL HIGH (ref 70–99)
Potassium: 4.1 mmol/L (ref 3.5–5.1)
Sodium: 144 mmol/L (ref 135–145)
Total Bilirubin: 2.2 mg/dL — ABNORMAL HIGH (ref 0.3–1.2)
Total Protein: 3.9 g/dL — ABNORMAL LOW (ref 6.5–8.1)

## 2021-09-24 LAB — GLUCOSE, CAPILLARY
Glucose-Capillary: 92 mg/dL (ref 70–99)
Glucose-Capillary: 172 mg/dL — ABNORMAL HIGH (ref 70–99)
Glucose-Capillary: 232 mg/dL — ABNORMAL HIGH (ref 70–99)
Glucose-Capillary: 251 mg/dL — ABNORMAL HIGH (ref 70–99)
Glucose-Capillary: 207 mg/dL — ABNORMAL HIGH (ref 70–99)
Glucose-Capillary: 127 mg/dL — ABNORMAL HIGH (ref 70–99)

## 2021-09-24 LAB — MAGNESIUM: Magnesium: 1.8 mg/dL (ref 1.7–2.4)

## 2021-09-24 MED ORDER — ROCURONIUM BROMIDE 10 MG/ML (PF) SYRINGE
100.0000 mg | PREFILLED_SYRINGE | Freq: Once | INTRAVENOUS | Status: AC
  Administered 2021-09-24: 100 mg via INTRAVENOUS

## 2021-09-24 MED ORDER — ETOMIDATE 2 MG/ML IV SOLN
20.0000 mg | Freq: Once | INTRAVENOUS | Status: AC
  Administered 2021-09-24: 20 mg via INTRAVENOUS

## 2021-09-24 MED ORDER — SODIUM CHLORIDE 0.9 % IV SOLN
3.0000 g | Freq: Four times a day (QID) | INTRAVENOUS | Status: DC
  Filled 2021-09-24: qty 8

## 2021-09-24 MED ORDER — SODIUM CHLORIDE 0.9 % IV SOLN
3.0000 g | Freq: Four times a day (QID) | INTRAVENOUS | Status: DC
  Administered 2021-09-24 – 2021-09-27 (×11): 3 g via INTRAVENOUS
  Filled 2021-09-24 (×10): qty 8

## 2021-09-24 MED ORDER — SODIUM PHOSPHATES 45 MMOLE/15ML IV SOLN
15.0000 mmol | Freq: Once | INTRAVENOUS | Status: AC
  Administered 2021-09-24: 15 mmol via INTRAVENOUS
  Filled 2021-09-24: qty 5

## 2021-09-24 MED ORDER — ETOMIDATE 2 MG/ML IV SOLN
INTRAVENOUS | Status: AC
  Filled 2021-09-24: qty 10

## 2021-09-24 MED ORDER — MAGNESIUM SULFATE 2 GM/50ML IV SOLN
2.0000 g | Freq: Once | INTRAVENOUS | Status: AC
  Administered 2021-09-24: 2 g via INTRAVENOUS
  Filled 2021-09-24: qty 50

## 2021-09-24 MED ORDER — SODIUM PHOSPHATES 45 MMOLE/15ML IV SOLN
30.0000 mmol | Freq: Once | INTRAVENOUS | Status: AC
  Administered 2021-09-24: 30 mmol via INTRAVENOUS
  Filled 2021-09-24: qty 10

## 2021-09-24 MED ORDER — LACTATED RINGERS IV BOLUS
1000.0000 mL | Freq: Once | INTRAVENOUS | Status: AC
  Administered 2021-09-24: 1000 mL via INTRAVENOUS

## 2021-09-24 MED ORDER — ROCURONIUM BROMIDE 10 MG/ML (PF) SYRINGE
PREFILLED_SYRINGE | INTRAVENOUS | Status: AC
  Filled 2021-09-24: qty 10

## 2021-09-24 MED ORDER — SODIUM CHLORIDE 0.9 % IV SOLN: INTRAVENOUS | Status: DC | PRN

## 2021-09-24 MED ORDER — INSULIN ASPART 100 UNIT/ML IJ SOLN
0.0000 [IU] | INTRAMUSCULAR | Status: DC
  Administered 2021-09-24: 11 [IU] via SUBCUTANEOUS
  Administered 2021-09-24: 7 [IU] via SUBCUTANEOUS
  Administered 2021-09-25: 3 [IU] via SUBCUTANEOUS
  Administered 2021-09-25: 4 [IU] via SUBCUTANEOUS
  Administered 2021-09-25: 7 [IU] via SUBCUTANEOUS
  Administered 2021-09-25 – 2021-09-26 (×3): 4 [IU] via SUBCUTANEOUS
  Administered 2021-09-26: 3 [IU] via SUBCUTANEOUS
  Administered 2021-09-26 (×3): 4 [IU] via SUBCUTANEOUS

## 2021-09-24 NOTE — Progress Notes (Signed)
PT Cancellation Note  Patient Details Name: Bill Garza MRN: [id number] DOB: 11/14/1960   Cancelled Treatment:    Reason Eval/Treat Not Completed: Patient not medically ready. RN requesting hold on PT today. Will plan to follow-up another day as able.   Moishe Spice, PT, DPT Acute Rehabilitation Services  Office: [place] 09/24/2021, 10:01 AM

## 2021-09-24 NOTE — Progress Notes (Signed)
OT Cancellation Note  Patient Details Name: Bill Garza MRN: [id number] DOB: [birth year]   Cancelled Treatment:    Reason Eval/Treat Not Completed: Medical issues which prohibited therapy: Hold OT today per RN.  [name] 09/24/2021, 10:50 AM

## 2021-09-24 NOTE — Progress Notes (Signed)
Pharmacy Antibiotic Note  Bill Garza is a 61 y.o. male admitted on 08/19/2021 with pneumonia.  Pharmacy has been consulted for vancomycin and transitioning cefepime to Unasyn dosing. SCr trended back down to 0.6 (last from 7/6).  Plan: D/c cefepime >> Unasyn 3g IV q6h Vancomycin 1250mg  IV q12h. Goal AUC 400-550. Expected AUC: 472 SCr used: 0.8 Monitor clinical progress, c/s, renal function F/u de-escalation plan/LOT, vancomycin levels as indicated   Height: 6' (182.9 cm) Weight: 75.8 kg (167 lb 1.7 oz) IBW/kg (Calculated) : 77.6  Temp (24hrs), Avg:101.5 F (38.6 C), Min:100.5 F (38.1 C), Max:102.9 F (39.4 C)  Recent Labs  Lab 09/18/21 0253 09/19/21 0103 09/21/21 0957 09/22/21 1036 09/23/21 0412  WBC 14.8*  --   --  11.3* 13.7*  CREATININE 0.50* 0.57* 0.50* 0.79 0.60*  LATICACIDVEN  --   --   --  2.2*  --      Estimated Creatinine Clearance: 107.9 mL/min (A) (by C-G formula based on SCr of 0.6 mg/dL (L)).    Allergies  Allergen Reactions   Keppra [Levetiracetam] Other (See Comments)    Personality changes   Lyrica [Pregabalin] Swelling    Leg swelling    Neurontin [Gabapentin] Swelling    Leg swelling    Compazine [Prochlorperazine] Nausea And Vomiting   Duloxetine Other (See Comments)    Excessive sleeping   Other Other (See Comments)    Antibiotics - per wife at bedside, patient will develop C-diff if given "any antibiotic"   Wellbutrin [Bupropion] Nausea And Vomiting   Zetia [Ezetimibe] Other (See Comments)    Unknown reaction   Lamictal [Lamotrigine] Itching and Rash    Antimicrobials this admission: 7/5 vancomycin >>  7/5 cefepime >>   Dose adjustments this admission:   Microbiology results:   Arturo Morton, PharmD, BCPS Please check AMION for all [place] contact numbers Clinical Pharmacist 09/24/2021 8:25 AM

## 2021-09-24 NOTE — Procedures (Signed)
Bronchoscopy Procedure Note  Draycen Leichter  [id number]  04/27/1960  Date:09/24/21  Time:9:23 AM   Provider Performing:Allysen Lazo   Procedure(s):  Flexible bronchoscopy with bronchial alveolar lavage (19379)  Indication(s) Acute respiratory failure  Consent Risks of the procedure as well as the alternatives and risks of each were explained to the patient and/or caregiver.  Consent for the procedure was obtained and is signed in the bedside chart  Anesthesia Etomidate and Rocuronium   Time Out Verified patient identification, verified procedure, site/side was marked, verified correct patient position, special equipment/implants available, medications/allergies/relevant history reviewed, required imaging and test results available.   Sterile Technique Usual hand hygiene, masks, gowns, and gloves were used   Procedure Description Bronchoscope advanced through endotracheal tube and into airway.  Airways were examined down to subsegmental level with findings noted below.   Following diagnostic evaluation, BAL(s) performed in RLL with normal saline and return of Greenish fluid  Findings: Moderate amount of thin greenish secretions noted throughout the airway tree more on the right than left   Complications/Tolerance None; patient tolerated the procedure well. Chest X-ray is needed post procedure.   EBL Minimal   Specimen(s) Sputum

## 2021-09-24 NOTE — Progress Notes (Addendum)
NAME:  Bill Garza, MRN:  [id number], DOB:  12/20/1960, LOS: 7 ADMISSION DATE:  09/13/2021, CONSULTATION DATE:  7/5 REFERRING MD:  Dr. Christella Noa, CHIEF COMPLAINT:  SOB, AMS    History of Present Illness:  61 y/o M who was admitted on 6/30 for planned discectomy.    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 He is followed by [name] for Oncology.  Currently it appears that he is on Zometa, next dose planned 11/07/2021.  At his last office visit his ECOG score was estimated to be 2.  He has had recent admission in May for intractable pain to [facility] where he required nerve block.  In addition he had an admission June 7 for pain at [facility].  He has limited PO intake with weight loss and has been on marinol for appetite stimulant.   Patient was admitted on 6/30 in the setting of cancer related chronic back pain for planned discectomy of L3-4, 4-5.  Imaging on admission revealed new spinal lesions.  Postoperative course complicated by difficult to control pain.  Per wife, the patient developed increasing shortness of breath on 7/4, moaning and increased pain.  Shortness of breath increased on a.m. of 7/5 as well as altered mental status.  Rapid response team called for evaluation.  Chest x-ray ordered which revealed mild bronchitic changes in the right lower lobe.  No acute infiltrate.  BMP showed anion gap metabolic acidosis with notable drop in CO2 from previous day labs.  PCCM consulted for evaluation.   Pertinent  Medical History  NSCLC with metastatic disease to brain, liver, spine.  S/p SRS, 1 cycle of adjuvant chemotherapy with cisplatin, Keytruda, Alimta.  Additional left occipital brain lesion resection with metastatic poorly differentiated carcinoma. VATS resection 08/2020 Portal Vein Thrombosis  COPD  Tobacco Abuse-quit smoking approximately 13 years ago, 40-pack-year history CAD s/p MI  HLD HLD Depression / Anxiety   Chronic Pain   Significant Hospital Events: Including procedures, antibiotic start and stop dates in addition to other pertinent events   6/30 Admit for planned discectomy 7/5 Tx to ICU for resp distress, intubated  Interim History / Subjective:  Patient continued to spike fever with Tmax 103.5 He is having copious amount of thin greenish secretions via ET tube Remained tachycardic  Objective   Blood pressure 106/76, pulse (!) 143, temperature (!) 103.5 F (39.7 C), resp. rate (!) 22, height 6' (1.829 m), weight 75.8 kg, SpO2 96 %.    Vent Mode: PRVC FiO2 (%):  [40 %-100 %] 40 % Set Rate:  [22 bmp-28 bmp] 22 bmp Vt Set:  [660 mL-720 mL] 660 mL PEEP:  [5 cmH20] 5 cmH20 Pressure Support:  [8 cmH20-10 cmH20] 8 cmH20 Plateau Pressure:  [19 cmH20-22 cmH20] 20 cmH20   Intake/Output Summary (Last 24 hours) at 09/24/2021 1024 Last data filed at 09/24/2021 0800 Gross per 24 hour  Intake 1995.99 ml  Output 250 ml  Net 1745.99 ml   Filed Weights   09/16/21 1403 09/15/2021 1351 09/23/21 0341  Weight: 72.1 kg 77.1 kg 75.8 kg    Examination: Physical exam: General: Crtitically ill-appearing male, orally intubated HEENT: [place]/AT, eyes anicteric.  ETT and OGT in place Neuro: Eyes open, not following commands, remain lethargic, withdrawing to painful stimuli Chest: Bilateral basal crackles right more than left, no wheezes or rhonchi Heart: Regular rate and rhythm, no murmurs or gallops Abdomen: Soft, nontender, nondistended, bowel sounds present Skin:  No rash  Resolved Hospital Problem list     Assessment & Plan:  Severe anion gap metabolic acidosis with respiratory compensation related to Starvation Ketosis  Acute respiratory failure with hypoxia Sepsis due to aspiration pneumonia, not present on admission Patient metabolic acidosis has improved, serum bicarbonate trended up from 10 to 16 Repeat labs are pending this morning He has respiratory compensation pH has normalized with  PCO2 28 Patient did not tolerate spontaneous breathing trial due to tachypnea and fever Patient continued to spike high-grade fever despite being on antibiotic for 48 hours Follow-up respiratory culture Continue vancomycin Cefepime was switched to IV Unasyn Follow-up blood cultures  Acute Metabolic Encephalopathy in the setting of severe acidosis Patient is off sedation Mental status remain poor CT head showed possible new lytic lesion on the skull  NSCLC with Metastatic Disease to Liver, Spine & Brain  Chronic Cancer Related Pain  Progression of disease on most recent imaging as of 08/25/21 Continue supportive care  Currently not a candidate for any therapy given clinical status  PRN flexeril for muscle spasms  Appreciate oncology follow-up -bowel regimen with narcotics   Portal Vein Thrombosis  Continue full dose lovenox with thrombosis and malignancy   Severe Protein Calorie Malnutrition  Suspected Starvation Ketosis  Continue tube feeds Monitor for refeeding syndrome  Dietitian following Continue multivitamins  CAD, HTN, HLD  Continue ASA  DM II  Continue to hold metformin Fingerstick remained stable Continue sliding scale insulin with CBG goal 140-180  GERD Gastroparesis   PPI   Hypophosphatemia likely due to refeeding syndrome Continue aggressive electrolyte replacement  Best Practice (right click and "Reselect all SmartList Selections" daily)  Diet/type: tubefeeds DVT prophylaxis: systemic dose LMWH GI prophylaxis: PPI Lines: N/A Foley:  N/A Code Status:  full code Last date of multidisciplinary goals of care discussion: 7/7 with patient's wife at bedside: wife indicates the patient stated "I'm not done" and he would want aggressive care at least in the short term.  Full code for now.   Labs   CBC: Recent Labs  Lab 09/18/21 0253 09/22/21 1036 09/23/21 0123 09/23/21 0412 09/23/21 0832  WBC 14.8* 11.3*  --  13.7*  --   HGB 9.3* 9.3* 8.2* 9.0* 9.5*   HCT 27.8* 30.7* 24.0* 28.9* 28.0*  MCV 92.1 98.4  --  95.1  --   PLT 481* 465*  --  394  --     Basic Metabolic Panel: Recent Labs  Lab 09/18/21 0253 09/19/21 0103 09/19/21 0103 09/21/21 0957 09/22/21 1036 09/22/21 1631 09/23/21 0123 09/23/21 0412 09/23/21 0832 09/23/21 1530  NA  --  132*   < > 138 141  --  141 143 137  --   K  --  4.9   < > 5.2* 5.0  --  4.0 3.9 4.0  --   CL  --  104  --  108 113*  --   --  113*  --   --   CO2  --  15*  --  11* 10*  --   --  16*  --   --   GLUCOSE  --  128*  --  102* 131*  --   --  154*  --   --   BUN  --  12  --  21* 25*  --   --  42*  --   --   CREATININE 0.50* 0.57*  --  0.50* 0.79  --   --  0.60*  --   --  CALCIUM  --  6.9*  --  7.5* 7.3*  --   --  7.1*  --   --   MG  --   --   --   --  2.1 2.1  --  2.0  --  2.3  PHOS  --   --   --   --  1.9* 1.8*  --  2.7  --  2.0*   < > = values in this interval not displayed.   GFR: Estimated Creatinine Clearance: 107.9 mL/min (A) (by C-G formula based on SCr of 0.6 mg/dL (L)). Recent Labs  Lab 09/18/21 0253 09/22/21 1036 09/23/21 0412  WBC 14.8* 11.3* 13.7*  LATICACIDVEN  --  2.2*  --     Liver Function Tests: Recent Labs  Lab 09/22/21 1036 09/23/21 0412  AST 357* 336*  ALT 95* 95*  ALKPHOS 1,019* 1,082*  BILITOT 2.5* 1.7*  PROT 5.1* 4.9*  ALBUMIN 1.5* <1.5*   No results for input(s): "LIPASE", "AMYLASE" in the last 168 hours. No results for input(s): "AMMONIA" in the last 168 hours.  ABG    Component Value Date/Time   PHART 7.399 09/23/2021 0832   PCO2ART 28.6 (L) 09/23/2021 0832   PO2ART 137 (H) 09/23/2021 0832   HCO3 17.7 (L) 09/23/2021 0832   TCO2 19 (L) 09/23/2021 0832   ACIDBASEDEF 6.0 (H) 09/23/2021 0832   O2SAT 99 09/23/2021 0832     Coagulation Profile: No results for input(s): "INR", "PROTIME" in the last 168 hours.  Cardiac Enzymes: No results for input(s): "CKTOTAL", "CKMB", "CKMBINDEX", "TROPONINI" in the last 168 hours.  HbA1C: Hgb A1c MFr Bld   Date/Time Value Ref Range Status  08/25/2021 05:30 PM 7.1 (H) 4.8 - 5.6 % Final    Comment:    (NOTE) Pre diabetes:          5.7%-6.4%  Diabetes:              >6.4%  Glycemic control for   <7.0% adults with diabetes     CBG: Recent Labs  Lab 09/23/21 0736 09/23/21 1125 09/23/21 1532 09/23/21 1950 09/23/21 2328  GLUCAP 84 112* 129* 162* 169*   Total critical care time: 36 minutes  Performed by: [place] care time was exclusive of separately billable procedures and treating other patients.   Critical care was necessary to treat or prevent imminent or life-threatening deterioration.   Critical care was time spent personally by me on the following activities: development of treatment plan with patient and/or surrogate as well as nursing, discussions with consultants, evaluation of patient's response to treatment, examination of patient, obtaining history from patient or surrogate, ordering and performing treatments and interventions, ordering and review of laboratory studies, ordering and review of radiographic studies, pulse oximetry and re-evaluation of patient's condition.   Jacky Kindle, MD [facility] See Amion for pager If no response to pager, please call [phone] until 7pm After 7pm, Please call E-link [phone]

## 2021-09-24 NOTE — Progress Notes (Signed)
Patient ID: Bill Garza, male   DOB: [birth year], 61 y.o.   MRN: [id number] BP 112/77   Pulse (!) 120   Temp 99.9 F (37.7 C)   Resp (!) 23   Ht 6' (1.829 m)   Wt 75.8 kg   SpO2 95%   BMI 22.66 kg/m  Not responsive. Pupils round and reactive Intubated, sedated No improvement  Appreciate ccm, I have no recommendations.

## 2021-09-25 DIAGNOSIS — R000 Tachycardia, unspecified: Secondary | ICD-10-CM

## 2021-09-25 DIAGNOSIS — Z9911 Dependence on respirator [ventilator] status: Secondary | ICD-10-CM | POA: Diagnosis not present

## 2021-09-25 DIAGNOSIS — J9601 Acute respiratory failure with hypoxia: Secondary | ICD-10-CM | POA: Diagnosis not present

## 2021-09-25 DIAGNOSIS — Z79899 Other long term (current) drug therapy: Secondary | ICD-10-CM

## 2021-09-25 DIAGNOSIS — C3492 Malignant neoplasm of unspecified part of left bronchus or lung: Secondary | ICD-10-CM | POA: Diagnosis not present

## 2021-09-25 DIAGNOSIS — C7951 Secondary malignant neoplasm of bone: Secondary | ICD-10-CM | POA: Diagnosis not present

## 2021-09-25 DIAGNOSIS — J189 Pneumonia, unspecified organism: Secondary | ICD-10-CM

## 2021-09-25 DIAGNOSIS — C7931 Secondary malignant neoplasm of brain: Secondary | ICD-10-CM | POA: Diagnosis not present

## 2021-09-25 LAB — BASIC METABOLIC PANEL
Anion gap: 11 (ref 5–15)
BUN: 62 mg/dL — ABNORMAL HIGH (ref 6–20)
CO2: 16 mmol/L — ABNORMAL LOW (ref 22–32)
Calcium: 6.6 mg/dL — ABNORMAL LOW (ref 8.9–10.3)
Chloride: 119 mmol/L — ABNORMAL HIGH (ref 98–111)
Creatinine, Ser: 0.51 mg/dL — ABNORMAL LOW (ref 0.61–1.24)
GFR, Estimated: >60 mL/min (ref >60)
Glucose, Bld: 89 mg/dL (ref 70–99)
Potassium: 3.7 mmol/L (ref 3.5–5.1)
Sodium: 146 mmol/L — ABNORMAL HIGH (ref 135–145)

## 2021-09-25 LAB — GLUCOSE, CAPILLARY
Glucose-Capillary: 96 mg/dL (ref 70–99)
Glucose-Capillary: 182 mg/dL — ABNORMAL HIGH (ref 70–99)
Glucose-Capillary: 170 mg/dL — ABNORMAL HIGH (ref 70–99)
Glucose-Capillary: 213 mg/dL — ABNORMAL HIGH (ref 70–99)
Glucose-Capillary: 168 mg/dL — ABNORMAL HIGH (ref 70–99)
Glucose-Capillary: 179 mg/dL — ABNORMAL HIGH (ref 70–99)

## 2021-09-25 LAB — C DIFFICILE (CDIFF) QUICK SCRN (NO PCR REFLEX)
C Diff antigen: NEGATIVE
C Diff interpretation: NOT DETECTED
C Diff toxin: NEGATIVE

## 2021-09-25 LAB — PHOSPHORUS: Phosphorus: 2.7 mg/dL (ref 2.5–4.6)

## 2021-09-25 LAB — MAGNESIUM: Magnesium: 2.7 mg/dL — ABNORMAL HIGH (ref 1.7–2.4)

## 2021-09-25 LAB — PREALBUMIN: Prealbumin: <5 mg/dL — ABNORMAL LOW (ref 18–38)

## 2021-09-25 MED ORDER — SODIUM CHLORIDE 0.9 % IV SOLN: 250.0000 mL | INTRAVENOUS | Status: DC

## 2021-09-25 MED ORDER — LACTATED RINGERS IV BOLUS
500.0000 mL | Freq: Once | INTRAVENOUS | Status: AC
  Administered 2021-09-25: 500 mL via INTRAVENOUS

## 2021-09-25 MED ORDER — POLYVINYL ALCOHOL 1.4 % OP SOLN
1.0000 [drp] | OPHTHALMIC | Status: DC | PRN
Start: 2021-09-25 — End: 2021-09-27

## 2021-09-25 MED ORDER — NOREPINEPHRINE 4 MG/250ML-% IV SOLN
2.0000 ug/min | INTRAVENOUS | Status: DC
  Administered 2021-09-26: 2 ug/min via INTRAVENOUS
  Administered 2021-09-26: 5 ug/min via INTRAVENOUS
  Administered 2021-09-27: 8 ug/min via INTRAVENOUS
  Administered 2021-09-27: 20 ug/min via INTRAVENOUS
  Filled 2021-09-25 (×5): qty 250

## 2021-09-25 MED ORDER — ALBUMIN HUMAN 5 % IV SOLN
25.0000 g | Freq: Once | INTRAVENOUS | Status: AC
Start: 2021-09-25 — End: 2021-09-25
  Administered 2021-09-25: 25 g via INTRAVENOUS
  Filled 2021-09-25: qty 500

## 2021-09-25 MED ORDER — ARTIFICIAL TEARS OPHTHALMIC OINT
TOPICAL_OINTMENT | Freq: Every evening | OPHTHALMIC | Status: DC | PRN
  Administered 2021-09-27: 1 via OPHTHALMIC
  Filled 2021-09-25: qty 3.5

## 2021-09-25 NOTE — Progress Notes (Signed)
   Providing Compassionate, Quality Care - Together   Subjective: Patient is intubated and on continuous IV Dilaudid infusion.  Objective: Vital signs in last 24 hours: Temp:  [99.5 F (37.5 C)-101.7 F (38.7 C)] 101.3 F (38.5 C) (07/08 0800) Pulse Rate:  [108-133] 130 (07/08 0800) Resp:  [13-44] 18 (07/08 0800) BP: (87-123)/(62-80) 88/62 (07/08 0800) SpO2:  [91 %-96 %] 94 % (07/08 0800) FiO2 (%):  [40 %] 40 % (07/08 0800)  Intake/Output from previous day: 07/07 0701 - 07/08 0700 In: 3932.3 [I.V.:93.3; NG/GT:1575; IV Piggyback:2264.1] Out: 1350 [Urine:950; Stool:400] Intake/Output this shift: Total I/O In: 491.2 [I.V.:16.2; NG/GT:375; IV Piggyback:100] Out: -   Febrile Intubated PERRLA 4 sluggish Purposeful moment, intermittently follows commands per nurse Surgical wound covered with gauze. Dressing is clean, dry, and intact.  Lab Results: Recent Labs    09/23/21 0412 09/23/21 0832  WBC 13.7*  --   HGB 9.0* 9.5*  HCT 28.9* 28.0*  PLT 394  --    BMET Recent Labs    09/24/21 0917 09/25/21 0229  NA 144 146*  K 4.1 3.7  CL 115* 119*  CO2 12* 16*  GLUCOSE 203* 89  BUN 50* 62*  CREATININE 0.52* 0.51*  CALCIUM 6.3* 6.6*    Studies/Results: DG CHEST PORT 1 VIEW  Result Date: 09/24/2021 CLINICAL DATA:  Acute hypercapnic respiratory failure.  Fever. EXAM: PORTABLE CHEST 1 VIEW COMPARISON:  09/23/2021 FINDINGS: Endotracheal tube is in place, tip approximately 3.0 centimeters above the carina. RIGHT subclavian line tip overlies the RIGHT subclavian vein. Feeding tube tip is off the image, below the gastroesophageal junction. Heart size is normal. Patchy infiltrate is identified at the LEFT lung base, similar to prior studies. Patient is slightly rotated to the RIGHT, accentuating RIGHT paratracheal opacity, likely normal soft tissues. IMPRESSION: Persistent LEFT LOWER lobe infiltrate. Electronically Signed   By: Nolon Nations M.D.   On: 09/24/2021 10:09     Assessment/Plan: Patient with non-small cell lung cancer with metastatic disease to the brain, spine, and liver. He underwent discectomies at L3-4 and L4-5 on 09/07/2021 by Dr. Christella Noa. Postop course complicated by pain control issues. On 09/21/2021, patient developed increasing shortness of breath, with altered mental status on 09/22/2021. He ultimately required intubation. He was found to have severe anion gap metabolic acidosis, resulting in acute metabolic encephalopathy.   LOS: 8 days   -Continue supportive efforts. Appreciate CCM's evaluation and recommendations for medical management. -No new Neurosurgical recommendations at this time.   Viona Gilmore, DNP, AGNP-C Nurse Practitioner  [facility] [place] [address], [address], [place], [place] [address] P: [phone]    F: [phone]  09/25/2021, 11:19 AM

## 2021-09-25 NOTE — IPAL (Signed)
  Interdisciplinary Goals of Care Family Meeting   Date carried out:: 09/25/2021  Location of the meeting: Bedside  Member's involved: Nurse Practitioner, Bedside Registered Nurse, and Family Member or next of kin  Durable Power of Attorney or acting medical decision maker: POA is patients spouse Bill Garza, patient daughter was also at bedside.   Discussion: I was able to discuss [name] with both spouse and daughter this am during am rounds. On chart review patient continues to progressively worsen with evolving septic shock in the setting of metastatic cancer and severe malnutrition. The family verbalizes understanding and reports close discussion with primary oncologist regarding plan of care. As of right now the family wishes to continues aggressive support with hopes of improvement over the weekend. Family plans to reconnect with care team Monday morning to revaluate goals of care   Code status: Full Code  Disposition: Continue current acute care   Time spent for the meeting: 25 mins   [name] [name], NP-C [facility] Personal contact information can be found on Amion  09/25/2021, 10:32 AM

## 2021-09-25 NOTE — Progress Notes (Signed)
I saw Bill Garza this morning.  He is still intubated.  He does awaken to my voice.  He is still quite tachycardic.  He is somewhat hypotensive.  He is on tube feeds 75 cc an hour.  There is no labs back yet from today.  I think the real problem with Bill Garza is is incredibly poor nutritional state.  I think this is what is going to be a real problem for him.  I know he is getting maximal nutritional intake right now with the tube feeds.  His last albumin was less than 1.5.  I will check a prealbumin on him.  It sounds like he may have pneumonia.  He is on broad-spectrum antibiotic coverage.  Cultures are pending.  I had a long talk with his wife yesterday.  We agreed that we just want to try to give him as much of a chance possible of trying to get off the ventilator.  I thought that if he just went through the weekend with the tube feeds and with the antibiotics then, next week they can try to take him off the ventilator.  I think it is going to be very tenuous as whether or not he can stay off the ventilator because of his poor nutritional state.  I told her there is nothing that is going to increase his nutritional state outside of the tube feeds that he is getting.  He is at maximal rate for when I can tell.  I realize he has metastatic lung cancer.  The CT of the brain does not really bother me with the "suspicious" lesion in his bone.  There is no parenchymal brain mets.  I think would be very very difficult to treat him with systemic chemotherapy unless his nutritional state improves.  Again, we just want to try to give Bill Garza the "benefit of the doubt" and try to see if he can improve his nutritional state.  Hopefully, antibiotics over the weekend and will be able to help him suppress his pneumonia a little bit.  I know this is incredibly complicated.  I really thought that after he had surgery, that his pain issues would resolve.  However, it is apparent that they will never resolve.   Again I do not think this is anything related to his underlying malignancy.  I see that he is on a low-dose Dilaudid drip.  I do not know if this is ever going to be able to be discontinued.  Hopefully, there will be some improvement over the weekend.  If not, then I would not favor reintubating him if he does not make it off the ventilator.  We will try to give him every possible chance.  I hope that he will make it off next week.  However, if his nutritional state does not improve significantly, I am not sure he would be able to maintain himself off artificial support.  I will have to keep talking with his wife.  I know that she wants as much as can be done for Bill Garza.  So far, everything is being done to try to help him.  I know she is appreciative of everybody's help.  Bill Haw, MD  Bill Garza 25:1

## 2021-09-25 NOTE — Progress Notes (Signed)
NAME:  Bill Garza, MRN:  [id number], DOB:  28-Jul-1960, LOS: 8 ADMISSION DATE:  09/12/2021, CONSULTATION DATE:  7/5 REFERRING MD:  Dr. Christella Noa, CHIEF COMPLAINT:  SOB, AMS    History of Present Illness:  61 y/o M who was admitted on 6/30 for planned discectomy.    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 He is followed by [name] for Oncology.  Currently it appears that he is on Zometa, next dose planned 11/07/2021.  At his last office visit his ECOG score was estimated to be 2.  He has had recent admission in May for intractable pain to [facility] where he required nerve block.  In addition he had an admission June 7 for pain at [facility].  He has limited PO intake with weight loss and has been on marinol for appetite stimulant.   Patient was admitted on 6/30 in the setting of cancer related chronic back pain for planned discectomy of L3-4, 4-5.  Imaging on admission revealed new spinal lesions.  Postoperative course complicated by difficult to control pain.  Per wife, the patient developed increasing shortness of breath on 7/4, moaning and increased pain.  Shortness of breath increased on a.m. of 7/5 as well as altered mental status.  Rapid response team called for evaluation.  Chest x-ray ordered which revealed mild bronchitic changes in the right lower lobe.  No acute infiltrate.  BMP showed anion gap metabolic acidosis with notable drop in CO2 from previous day labs.  PCCM consulted for evaluation.   Pertinent  Medical History  NSCLC with metastatic disease to brain, liver, spine.  S/p SRS, 1 cycle of adjuvant chemotherapy with cisplatin, Keytruda, Alimta.  Additional left occipital brain lesion resection with metastatic poorly differentiated carcinoma. VATS resection 08/2020 Portal Vein Thrombosis  COPD  Tobacco Abuse-quit smoking approximately 13 years ago, 40-pack-year history CAD s/p MI  HLD HLD Depression / Anxiety   Chronic Pain   Significant Hospital Events: Including procedures, antibiotic start and stop dates in addition to other pertinent events   6/30 Admit for planned discectomy 7/5 Tx to ICU for resp distress, intubated 7/8 Developed progressive tachycardia. Hypotension, and fever overnight resulting in worsening shock state   Interim History / Subjective:  Sedated on vent  5L positive   Objective   Blood pressure (!) 88/62, pulse (!) 130, temperature (!) 101.3 F (38.5 C), temperature source Esophageal, resp. rate 18, height 6' (1.829 m), weight 75.8 kg, SpO2 94 %.    Vent Mode: PRVC FiO2 (%):  [40 %] 40 % Set Rate:  [22 bmp] 22 bmp Vt Set:  [035 mL] 660 mL PEEP:  [5 cmH20] 5 cmH20 Plateau Pressure:  [12 cmH20-24 cmH20] 19 cmH20   Intake/Output Summary (Last 24 hours) at 09/25/2021 0918 Last data filed at 09/25/2021 0800 Gross per 24 hour  Intake 4271.66 ml  Output 1350 ml  Net 2921.66 ml    Filed Weights   09/16/21 1403 09/13/2021 1351 09/23/21 0341  Weight: 72.1 kg 77.1 kg 75.8 kg    Examination: General: Acute on chronically very deconditioned cachectic middle aged male lying in bed on mechanical ventilation, in NAD HEENT: ETT, MM pink/moist, PERRL,  Neuro: Sedated on vent  CV: s1s2 regular rate and rhythm, no murmur, rubs, or gallops,  PULM:  Faint rhonchi bilaterally, tolerating vent, minimal tracheal secretions  GI: soft, bowel sounds active in all 4 quadrants, non-tender, non-distended, tolerating TF Extremities: warm/dry,  2+ pitting edema  Skin: no rashes or lesions  Resolved Hospital Problem list     Assessment & Plan:  Severe anion gap metabolic acidosis with respiratory compensation related to Starvation Ketosis  Acute respiratory failure with hypoxia Sepsis due to aspiration pneumonia, not present on admission -Patient metabolic acidosis has improved, serum bicarbonate trended up from 10 to 16 P: Remains critically ill in the ICU Vent support as above   Follow cultures  Continue IV Unasyn and Vancomycin  Small LR bolus now MAP currently above 65 if drops start peripheral levo  Monitor urine output  Acute Metabolic Encephalopathy in the setting of severe acidosis -CT head showed possible new lytic lesion on the skull P: Maintain neuro protective measures; goal for eurothermia, euglycemia, eunatermia, normoxia, and PCO2 goal of 35-40 Nutrition and bowel regiment  Seizure precautions  Aspirations precautions  Minimize sedation as able   Significant liquid stool output with concern for C-diff  -Wife states patient has have C-diff in the past with antibiotics P: Obtain c-diff culture  Enteric precautions  Frequent peri care  NSCLC with Metastatic Disease to Liver, Spine & Brain  Chronic Cancer Related Pain  -Progression of disease on most recent imaging as of 08/25/21 -Currently not a candidate for any therapy given clinical status  P: Primary management per oncology  Continue attempts at adequate pain control  Bowel regiment   Portal Vein Thrombosis  P: Continue full dose lovenox   Severe Protein Calorie Malnutrition  Suspected Starvation Ketosis  P: Continue tube feeds  Optimize nutrition  Dietary following  Monitor for refeeding syndrome   CAD, HTN, HLD  P: Continue ASA and full dose lovenox   DM II  P: Continue SSI  Continue to hold home metformin  CBG goal 140-180  GERD Gastroparesis   P: PPI  Hypophosphatemia likely due to refeeding syndrome P: Supplement as needed  Trend bmet  Best Practice (right click and "Reselect all SmartList Selections" daily)  Diet/type: tubefeeds DVT prophylaxis: systemic dose LMWH GI prophylaxis: PPI Lines: N/A Foley:  N/A Code Status:  full code Last date of multidisciplinary goals of care discussion: See IPAL note dictated 7/8  Total critical care time: 40 minutes  Performed by: Esmerelda Finnigan Harris    Critical care time was exclusive of separately billable  procedures and treating other patients.   Critical care was necessary to treat or prevent imminent or life-threatening deterioration.   Critical care was time spent personally by me on the following activities: development of treatment plan with patient and/or surrogate as well as nursing, discussions with consultants, evaluation of patient's response to treatment, examination of patient, obtaining history from patient or surrogate, ordering and performing treatments and interventions, ordering and review of laboratory studies, ordering and review of radiographic studies, pulse oximetry and re-evaluation of patient's condition.   Jaliel Deavers [name], NP-C [facility] Personal contact information can be found on Amion  09/25/2021, 10:18 AM

## 2021-09-26 ENCOUNTER — Inpatient Hospital Stay (HOSPITAL_COMMUNITY): Payer: Medicare HMO

## 2021-09-26 DIAGNOSIS — J9601 Acute respiratory failure with hypoxia: Secondary | ICD-10-CM | POA: Diagnosis not present

## 2021-09-26 DIAGNOSIS — M48061 Spinal stenosis, lumbar region without neurogenic claudication: Secondary | ICD-10-CM | POA: Diagnosis not present

## 2021-09-26 LAB — POCT I-STAT 7, (LYTES, BLD GAS, ICA,H+H)
Acid-base deficit: 9 mmol/L — ABNORMAL HIGH (ref 0.0–2.0)
Bicarbonate: 17 mmol/L — ABNORMAL LOW (ref 20.0–28.0)
Calcium, Ion: 1 mmol/L — ABNORMAL LOW (ref 1.15–1.40)
HCT: 27 % — ABNORMAL LOW (ref 39.0–52.0)
Hemoglobin: 9.2 g/dL — ABNORMAL LOW (ref 13.0–17.0)
O2 Saturation: 73 %
Patient temperature: 99.4
Potassium: 3.2 mmol/L — ABNORMAL LOW (ref 3.5–5.1)
Sodium: 150 mmol/L — ABNORMAL HIGH (ref 135–145)
TCO2: 18 mmol/L — ABNORMAL LOW (ref 22–32)
pCO2 arterial: 37.4 mmHg (ref 32–48)
pH, Arterial: 7.267 — ABNORMAL LOW (ref 7.35–7.45)
pO2, Arterial: 45 mmHg — ABNORMAL LOW (ref 83–108)

## 2021-09-26 LAB — GLUCOSE, CAPILLARY
Glucose-Capillary: 158 mg/dL — ABNORMAL HIGH (ref 70–99)
Glucose-Capillary: 142 mg/dL — ABNORMAL HIGH (ref 70–99)
Glucose-Capillary: 106 mg/dL — ABNORMAL HIGH (ref 70–99)
Glucose-Capillary: 177 mg/dL — ABNORMAL HIGH (ref 70–99)
Glucose-Capillary: 166 mg/dL — ABNORMAL HIGH (ref 70–99)
Glucose-Capillary: 117 mg/dL — ABNORMAL HIGH (ref 70–99)

## 2021-09-26 LAB — CBC WITH DIFFERENTIAL/PLATELET
Abs Immature Granulocytes: 0 10*3/uL (ref 0.00–0.07)
Basophils Absolute: 0 10*3/uL (ref 0.0–0.1)
Basophils Relative: 0 %
Eosinophils Absolute: 0 10*3/uL (ref 0.0–0.5)
Eosinophils Relative: 0 %
HCT: 25.2 % — ABNORMAL LOW (ref 39.0–52.0)
HCT: 24.6 % — ABNORMAL LOW (ref 39.0–52.0)
Hemoglobin: 7.6 g/dL — ABNORMAL LOW (ref 13.0–17.0)
Hemoglobin: 7.7 g/dL — ABNORMAL LOW (ref 13.0–17.0)
Lymphocytes Relative: 7 %
Lymphs Abs: 0.9 10*3/uL (ref 0.7–4.0)
MCH: 29.5 pg (ref 26.0–34.0)
MCH: 29.5 pg (ref 26.0–34.0)
MCHC: 30.2 g/dL (ref 30.0–36.0)
MCHC: 31.3 g/dL (ref 30.0–36.0)
MCV: 97.7 fL (ref 80.0–100.0)
MCV: 94.3 fL (ref 80.0–100.0)
Monocytes Absolute: 0.1 10*3/uL (ref 0.1–1.0)
Monocytes Relative: 1 %
Neutro Abs: 12.1 10*3/uL — ABNORMAL HIGH (ref 1.7–7.7)
Neutrophils Relative %: 92 %
Platelets: 260 10*3/uL (ref 150–400)
Platelets: 287 10*3/uL (ref 150–400)
RBC: 2.58 MIL/uL — ABNORMAL LOW (ref 4.22–5.81)
RBC: 2.61 MIL/uL — ABNORMAL LOW (ref 4.22–5.81)
RDW: 21.2 % — ABNORMAL HIGH (ref 11.5–15.5)
RDW: 20.9 % — ABNORMAL HIGH (ref 11.5–15.5)
WBC: 15.9 10*3/uL — ABNORMAL HIGH (ref 4.0–10.5)
WBC: 13.2 10*3/uL — ABNORMAL HIGH (ref 4.0–10.5)
nRBC: 2.7 % — ABNORMAL HIGH (ref 0.0–0.2)
nRBC: 2.9 % — ABNORMAL HIGH (ref 0.0–0.2)
nRBC: 4 /100 WBC — ABNORMAL HIGH

## 2021-09-26 LAB — COMPREHENSIVE METABOLIC PANEL
ALT: 62 U/L — ABNORMAL HIGH (ref 0–44)
AST: 118 U/L — ABNORMAL HIGH (ref 15–41)
Albumin: <1.5 g/dL — ABNORMAL LOW (ref 3.5–5.0)
Alkaline Phosphatase: 525 U/L — ABNORMAL HIGH (ref 38–126)
Anion gap: 17 — ABNORMAL HIGH (ref 5–15)
BUN: 78 mg/dL — ABNORMAL HIGH (ref 6–20)
CO2: 15 mmol/L — ABNORMAL LOW (ref 22–32)
Calcium: 6.5 mg/dL — ABNORMAL LOW (ref 8.9–10.3)
Chloride: 117 mmol/L — ABNORMAL HIGH (ref 98–111)
Creatinine, Ser: 0.93 mg/dL (ref 0.61–1.24)
GFR, Estimated: >60 mL/min (ref >60)
Glucose, Bld: 156 mg/dL — ABNORMAL HIGH (ref 70–99)
Potassium: 4 mmol/L (ref 3.5–5.1)
Sodium: 149 mmol/L — ABNORMAL HIGH (ref 135–145)
Total Bilirubin: 1.7 mg/dL — ABNORMAL HIGH (ref 0.3–1.2)
Total Protein: 4.6 g/dL — ABNORMAL LOW (ref 6.5–8.1)

## 2021-09-26 MED ORDER — LACTATED RINGERS IV BOLUS
500.0000 mL | Freq: Once | INTRAVENOUS | Status: AC
  Administered 2021-09-26: 500 mL via INTRAVENOUS

## 2021-09-26 MED ORDER — NUTRISOURCE FIBER PO PACK
1.0000 | PACK | Freq: Two times a day (BID) | ORAL | Status: DC
  Administered 2021-09-26 (×2): 1
  Filled 2021-09-26 (×2): qty 1

## 2021-09-26 MED ORDER — ALBUMIN HUMAN 5 % IV SOLN
25.0000 g | Freq: Once | INTRAVENOUS | Status: AC
Start: 2021-09-26 — End: 2021-09-26
  Administered 2021-09-26: 12.5 g via INTRAVENOUS
  Filled 2021-09-26: qty 500

## 2021-09-26 NOTE — Progress Notes (Signed)
NAME:  Bill Garza, MRN:  [id number], DOB:  03/16/1961, LOS: 9 ADMISSION DATE:  08/25/2021, CONSULTATION DATE:  7/5 REFERRING MD:  Dr. Christella Noa, CHIEF COMPLAINT:  SOB, AMS    History of Present Illness:  61 y/o M who was admitted on 6/30 for planned discectomy.    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 He is followed by [name] for Oncology.  Currently it appears that he is on Zometa, next dose planned 11/07/2021.  At his last office visit his ECOG score was estimated to be 2.  He has had recent admission in May for intractable pain to [facility] where he required nerve block.  In addition he had an admission June 7 for pain at [facility].  He has limited PO intake with weight loss and has been on marinol for appetite stimulant.   Patient was admitted on 6/30 in the setting of cancer related chronic back pain for planned discectomy of L3-4, 4-5.  Imaging on admission revealed new spinal lesions.  Postoperative course complicated by difficult to control pain.  Per wife, the patient developed increasing shortness of breath on 7/4, moaning and increased pain.  Shortness of breath increased on a.m. of 7/5 as well as altered mental status.  Rapid response team called for evaluation.  Chest x-ray ordered which revealed mild bronchitic changes in the right lower lobe.  No acute infiltrate.  BMP showed anion gap metabolic acidosis with notable drop in CO2 from previous day labs.  PCCM consulted for evaluation.   Pertinent  Medical History  NSCLC with metastatic disease to brain, liver, spine.  S/p SRS, 1 cycle of adjuvant chemotherapy with cisplatin, Keytruda, Alimta.  Additional left occipital brain lesion resection with metastatic poorly differentiated carcinoma. VATS resection 08/2020 Portal Vein Thrombosis  COPD  Tobacco Abuse-quit smoking approximately 13 years ago, 40-pack-year history CAD s/p MI  HLD HLD Depression / Anxiety   Chronic Pain   Significant Hospital Events: Including procedures, antibiotic start and stop dates in addition to other pertinent events   6/30 Admit for planned discectomy 7/5 Tx to ICU for resp distress, intubated 7/8 Developed progressive tachycardia. Hypotension, and fever overnight resulting in worsening shock state  7/9 remains minimally responsive on ventilator in the setting of Dilaudid drip.  Remains critically ill in the setting of metastatic lung cancer  Interim History / Subjective:  Sedated on ventilator 6 L positive despite 2 L of liquid stool in the last 24 hours Severe anasarca with weeping extremities Remains febrile with tachycardia and hypoxia per ABG  Objective   Blood pressure 112/72, pulse (!) 117, temperature 98.9 F (37.2 C), temperature source Esophageal, resp. rate 12, height 6' (1.829 m), weight 75.8 kg, SpO2 94 %.    Vent Mode: PRVC FiO2 (%):  [40 %-50 %] 50 % Set Rate:  [22 bmp] 22 bmp Vt Set:  [409 mL] 660 mL PEEP:  [5 cmH20] 5 cmH20 Plateau Pressure:  [13 cmH20-23 cmH20] 13 cmH20   Intake/Output Summary (Last 24 hours) at 09/26/2021 0751 Last data filed at 09/26/2021 0600 Gross per 24 hour  Intake 3597.02 ml  Output 2800 ml  Net 797.02 ml    Filed Weights   09/16/21 1403 09/10/2021 1351 09/23/21 0341  Weight: 72.1 kg 77.1 kg 75.8 kg    Examination: General: Acute on chronic severely deconditioned cachectic middle-aged male lying in bed in no acute distress HEENT: ETT, MM pink/moist, PERRL,  Neuro: Eyes spontaneously open but does not track or follow any commands on Dilaudid drip CV: s1s2 regular rate and rhythm, no murmur, rubs, or gallops,  PULM: Faint bilateral rhonchi, tolerating ventilator ABG with persistent hypoxia GI: soft, bowel sounds active in all 4 quadrants, non-tender, non-distended, tolerating TF Extremities: warm/dry, generalized anasarca with weeping extremities Skin: no rashes or lesions  Resolved Hospital Problem list      Assessment & Plan:  Sepsis due to aspiration pneumonia with acute hypoxic respiratory failure, not present on admission -ABG a.m. of 7/9 pH 7.2, PCO2 37.4, PO2 45, bicarb 17.0 Severe anion gap metabolic acidosis with respiratory compensation related to starvation ketosis  -Patient metabolic acidosis has improved, serum bicarbonate trended up from 10 to 15 P: Remains critically ill in the ICU Continue to follow cultures Continue ventilator support with lung protective strategies  Wean PEEP and FiO2 for sats greater than 90%. Head of bed elevated 30 degrees. Plateau pressures less than 30 cm H20.  Follow intermittent chest x-ray and ABG.   SAT/SBT as tolerated, mentation preclude extubation  Ensure adequate pulmonary hygiene  Follow cultures  VAP bundle in place  PAD protocol Continue empiric IV Unasyn and vancomycin Pressors for map goal greater than 65  Acute Metabolic Encephalopathy in the setting of severe acidosis -CT head showed possible new lytic lesion on the skull P: Maintain neuroprotective measures Nutrition and bowel regiment Seizure precautions  Aspiration precautions Minimize sedation  Significant liquid stool output  -C. difficile negative 7/8 -Patient produced 2 L of liquid stool in the last 24 hours P: Frequent PeriCare Add fiber Consider changing tube feed will discuss with dietitian  NSCLC with Metastatic Disease to Liver, Spine & Brain  Chronic Cancer Related Pain  -Progression of disease on most recent imaging as of 08/25/21 -Currently not a candidate for any therapy given clinical status  P: Primary management, per oncology Continue attempts to adequately control pain Monitor for constipation currently producing high-volume liquid stool Very poor prognosis for meaningful outcome given multisystem organ dysfunction family is expected to discuss goals of care with oncology tomorrow morning.  Would encourage comfort approach  Portal Vein Thrombosis   P: Continue full dose Lovenox  Severe Protein Calorie Malnutrition  Suspected Starvation Ketosis  P: Continue tube feeds Optimize nutrition Dietary following Monitor for refeeding syndrome  CAD, HTN, HLD  P: Continue aspirin and full dose Plavix  DM II  P: Continue SSI CBG every 4hrs CBG goal 140-180 Hold home metformin  GERD Gastroparesis   P: PPI  Hypophosphatemia likely due to refeeding syndrome P: Supplement as needed Trend bmet  Best Practice (right click and "Reselect all SmartList Selections" daily)  Diet/type: tubefeeds DVT prophylaxis: systemic dose LMWH GI prophylaxis: PPI Lines: N/A Foley:  N/A Code Status:  full code Last date of multidisciplinary goals of care discussion: See IPAL note dictated 7/8  Total critical care time: 40 minutes  Performed by: Nesiah Jump Harris    Critical care time was exclusive of separately billable procedures and treating other patients.   Critical care was necessary to treat or prevent imminent or life-threatening deterioration.   Critical care was time spent personally by me on the following activities: development of treatment plan with patient and/or surrogate as well as nursing, discussions with consultants, evaluation of patient's response to treatment, examination of patient, obtaining history from patient or surrogate, ordering and performing treatments and interventions, ordering and review of laboratory studies, ordering and review of radiographic studies, pulse oximetry and re-evaluation  of patient's condition.   Kathleena Freeman [name], NP-C [facility] Personal contact information can be found on Amion  09/26/2021, 7:51 AM

## 2021-09-26 NOTE — Progress Notes (Signed)
Remains critically ill secondary to pulmonary failure.  Still intermittently febrile.  Currently afebrile.  His vital signs are stable.  Urine output acceptable.  Sedated on ventilator.  Status post lumbar surgery complicated by extreme pulmonary failure with associated pneumonia.  No new recommendations from our standpoint.

## 2021-09-26 NOTE — Progress Notes (Signed)
RT advanced et tube to 25cm @lip  per cxr results. Return volumes and BLBS.

## 2021-09-26 NOTE — Progress Notes (Signed)
[facility] Progress Note Patient Name: Nate Common DOB: Jul 07, 1960 MRN: [id number]   Date of Service  09/26/2021  HPI/Events of Note  Notified of hypotension of peripheral dose norepinephrine He is 4 liters positive but with ongoing losses ie diarrhea, fever of 103 (cooling blanket temporarily stopped working)  eICU Interventions  Ordered a trial of 500 cc LR bolus     Intervention Category Intermediate Interventions: Hypotension - evaluation and management  Judd Lien 09/26/2021, 2:14 AM

## 2021-09-27 ENCOUNTER — Inpatient Hospital Stay (HOSPITAL_COMMUNITY): Payer: Medicare HMO

## 2021-09-27 DIAGNOSIS — A419 Sepsis, unspecified organism: Secondary | ICD-10-CM

## 2021-09-27 DIAGNOSIS — C7951 Secondary malignant neoplasm of bone: Secondary | ICD-10-CM | POA: Diagnosis not present

## 2021-09-27 DIAGNOSIS — Z9911 Dependence on respirator [ventilator] status: Secondary | ICD-10-CM | POA: Diagnosis not present

## 2021-09-27 DIAGNOSIS — C7931 Secondary malignant neoplasm of brain: Secondary | ICD-10-CM | POA: Diagnosis not present

## 2021-09-27 DIAGNOSIS — M48061 Spinal stenosis, lumbar region without neurogenic claudication: Secondary | ICD-10-CM | POA: Diagnosis not present

## 2021-09-27 DIAGNOSIS — C3492 Malignant neoplasm of unspecified part of left bronchus or lung: Secondary | ICD-10-CM | POA: Diagnosis not present

## 2021-09-27 DIAGNOSIS — R031 Nonspecific low blood-pressure reading: Secondary | ICD-10-CM

## 2021-09-27 DIAGNOSIS — J9601 Acute respiratory failure with hypoxia: Secondary | ICD-10-CM | POA: Diagnosis not present

## 2021-09-27 DIAGNOSIS — R770 Abnormality of albumin: Secondary | ICD-10-CM

## 2021-09-27 DIAGNOSIS — Z931 Gastrostomy status: Secondary | ICD-10-CM

## 2021-09-27 LAB — CULTURE, RESPIRATORY W GRAM STAIN: Culture: NORMAL

## 2021-09-27 LAB — COMPREHENSIVE METABOLIC PANEL
ALT: 84 U/L — ABNORMAL HIGH (ref 0–44)
AST: 245 U/L — ABNORMAL HIGH (ref 15–41)
Albumin: 1.7 g/dL — ABNORMAL LOW (ref 3.5–5.0)
Alkaline Phosphatase: 541 U/L — ABNORMAL HIGH (ref 38–126)
Anion gap: 18 — ABNORMAL HIGH (ref 5–15)
BUN: 88 mg/dL — ABNORMAL HIGH (ref 6–20)
CO2: 11 mmol/L — ABNORMAL LOW (ref 22–32)
Calcium: 6.5 mg/dL — ABNORMAL LOW (ref 8.9–10.3)
Chloride: 118 mmol/L — ABNORMAL HIGH (ref 98–111)
Creatinine, Ser: 1.62 mg/dL — ABNORMAL HIGH (ref 0.61–1.24)
GFR, Estimated: 49 mL/min — ABNORMAL LOW (ref >60)
Glucose, Bld: 111 mg/dL — ABNORMAL HIGH (ref 70–99)
Potassium: 4.9 mmol/L (ref 3.5–5.1)
Sodium: 147 mmol/L — ABNORMAL HIGH (ref 135–145)
Total Bilirubin: 1.9 mg/dL — ABNORMAL HIGH (ref 0.3–1.2)
Total Protein: 5 g/dL — ABNORMAL LOW (ref 6.5–8.1)

## 2021-09-27 LAB — CBC WITH DIFFERENTIAL/PLATELET
Abs Immature Granulocytes: 0 10*3/uL (ref 0.00–0.07)
Basophils Absolute: 0 10*3/uL (ref 0.0–0.1)
Basophils Relative: 0 %
Eosinophils Absolute: 0 10*3/uL (ref 0.0–0.5)
Eosinophils Relative: 0 %
HCT: 31.2 % — ABNORMAL LOW (ref 39.0–52.0)
Hemoglobin: 9.2 g/dL — ABNORMAL LOW (ref 13.0–17.0)
Lymphocytes Relative: 8 %
Lymphs Abs: 1.2 10*3/uL (ref 0.7–4.0)
MCH: 29.8 pg (ref 26.0–34.0)
MCHC: 29.5 g/dL — ABNORMAL LOW (ref 30.0–36.0)
MCV: 101 fL — ABNORMAL HIGH (ref 80.0–100.0)
Monocytes Absolute: 0.7 10*3/uL (ref 0.1–1.0)
Monocytes Relative: 5 %
Neutro Abs: 12.8 10*3/uL — ABNORMAL HIGH (ref 1.7–7.7)
Neutrophils Relative %: 87 %
Platelets: 314 10*3/uL (ref 150–400)
RBC: 3.09 MIL/uL — ABNORMAL LOW (ref 4.22–5.81)
RDW: 21.5 % — ABNORMAL HIGH (ref 11.5–15.5)
WBC: 14.7 10*3/uL — ABNORMAL HIGH (ref 4.0–10.5)
nRBC: 6 /100 WBC — ABNORMAL HIGH
nRBC: 6.2 % — ABNORMAL HIGH (ref 0.0–0.2)

## 2021-09-27 LAB — MAGNESIUM: Magnesium: 2.9 mg/dL — ABNORMAL HIGH (ref 1.7–2.4)

## 2021-09-27 LAB — PHOSPHORUS: Phosphorus: 4.7 mg/dL — ABNORMAL HIGH (ref 2.5–4.6)

## 2021-09-27 LAB — GLUCOSE, CAPILLARY: Glucose-Capillary: 101 mg/dL — ABNORMAL HIGH (ref 70–99)

## 2021-09-27 MED ORDER — GLYCOPYRROLATE 1 MG PO TABS: 1.0000 mg | ORAL_TABLET | ORAL | Status: DC | PRN

## 2021-09-27 MED ORDER — SODIUM CHLORIDE 0.9 % IV SOLN: INTRAVENOUS | Status: DC

## 2021-09-27 MED ORDER — GLYCOPYRROLATE 0.2 MG/ML IJ SOLN: 0.2000 mg | INTRAMUSCULAR | Status: DC | PRN

## 2021-09-27 MED ORDER — ALBUMIN HUMAN 5 % IV SOLN
25.0000 g | Freq: Once | INTRAVENOUS | Status: AC
  Administered 2021-09-27: 12.5 g via INTRAVENOUS
  Filled 2021-09-27: qty 500

## 2021-09-27 MED ORDER — SODIUM CHLORIDE 0.9 % IV SOLN: 250.0000 mL | INTRAVENOUS | Status: DC

## 2021-09-27 MED ORDER — PHENYLEPHRINE HCL-NACL 20-0.9 MG/250ML-% IV SOLN
25.0000 ug/min | INTRAVENOUS | Status: DC
  Administered 2021-09-27: 25 ug/min via INTRAVENOUS
  Administered 2021-09-27 (×2): 200 ug/min via INTRAVENOUS
  Filled 2021-09-27 (×3): qty 250

## 2021-09-27 MED ORDER — VASOPRESSIN 20 UNITS/100 ML INFUSION FOR SHOCK
0.0000 [IU]/min | INTRAVENOUS | Status: DC
  Administered 2021-09-27 (×2): 0.03 [IU]/min via INTRAVENOUS
  Filled 2021-09-27 (×2): qty 100

## 2021-09-27 MED ORDER — VANCOMYCIN VARIABLE DOSE PER UNSTABLE RENAL FUNCTION (PHARMACIST DOSING): Status: DC

## 2021-09-28 ENCOUNTER — Encounter: Payer: Self-pay | Admitting: *Deleted

## 2021-09-28 LAB — CULTURE, BLOOD (ROUTINE X 2)
Culture: NO GROWTH
Culture: NO GROWTH
Special Requests: ADEQUATE
Special Requests: ADEQUATE

## 2021-09-28 NOTE — Progress Notes (Signed)
Flowers to family sent per Dr Marin Olp request

## 2021-09-30 ENCOUNTER — Other Ambulatory Visit: Payer: Medicare HMO

## 2021-10-04 ENCOUNTER — Inpatient Hospital Stay: Payer: Medicare HMO

## 2021-10-19 NOTE — IPAL (Signed)
  Interdisciplinary Goals of Care Family Meeting   Date carried out:: Oct 14, 2021  Location of the meeting: Bedside  Member's involved: Physician, Bedside Registered Nurse, and Family Member or next of kin  Durable Power of Attorney or acting medical decision maker: Langley Gauss spouse and multiple family members in the room  Discussion: We discussed goals of care for Bill Garza who is a 61 y/o male with widely metastatic NSCLC admitted for lumbar surgery, required intubation in setting of severe metabolic acidosis on 7/5.  Since then his course has declined in that he has required significantly more vasopressors in the last 24 hours.  Despite three vasopressors his blood pressure continues to decline.  He received crystalloid and colloid resuscitation overnight.  This morning his blood pressure continues to decline and his abdomen is taught.  He likely has an intra-abdominal catastrophe such as ischemic bowel or a perforation.  He is emaciated, severely swollen.  At this point cardiac arrest is Administrator, arts.  If he were to undergo CPR it would not be medically beneficial because of his overall frail state, advanced cancer and the severity of his multi-organ failure.  His wife and family do not want him to go through CPR because they understand it would not help him.  Code status: Full DNR, continue mechanical ventilation and vasopressors for now.    Disposition: Continue current acute care   Time spent for the meeting: 30 minutes  Roselie Awkward 2021-10-14, 7:47 AM

## 2021-10-19 NOTE — Progress Notes (Addendum)
[facility] Progress Note Patient Name: Bill Garza DOB: Jul 31, 1960 MRN: [id number]   Date of Service  2021-10-09  HPI/Events of Note  Notified of hypotension with BP 86/67, MAP 75.  Pt is max on levophed at 17mcg/min. Pt is volume overloaded and is net positive 8L.   eICU Interventions  Add vasopressin peripherally.  Continue levophed.  Add on lactic acid.      Intervention Category Intermediate Interventions: Hypotension - evaluation and management  Elsie Lincoln 10/09/2021, 1:01 AM  3:49 AM Pt remains hypotensive with BP as low as 64/42. On recheck BP 97/86.  Plan> Give albumin 25g.

## 2021-10-19 NOTE — Progress Notes (Signed)
Pt extubated to comfort care per order.

## 2021-10-19 NOTE — Progress Notes (Signed)
[facility] Progress Note Patient Name: Bill Garza DOB: January 28, 1961 MRN: [id number]   Date of Service  10/21/2021  HPI/Events of Note  Pt remains hypotensive after albumin and is maxed on peripheral levophed.  Pt started on vasopressin overnight.   Son was at the bedside this morning and requested to speak with a doctor.  He expressed that he is not giving up on his Dad just yet.  This was after explaining that the patient has increasing pressor requirements and is going into multi-organ failure.   eICU Interventions  Start on phenylephrine.   Pt will need central line placement this morning.  Code status remains full code.      Intervention Category Major Interventions: Shock - evaluation and management  Elsie Lincoln Oct 21, 2021, 6:29 AM

## 2021-10-19 NOTE — Progress Notes (Signed)
Unfortunately, things just are not getting better for Mr. Odea.  I know that he is trying hard.  Everybody up in the ICU is doing what they can do.  He is on 2 pressors now.  His blood pressure is barely adequate.  I think that the pressors will begin to affect his other organs.  He is on tube feeds.  His albumin is still quite low at 1.7.  I think more importantly the fact that his prealbumin is less than 5.  I just do not think he is going to be strong enough to make it off life support.  I just hate to see him go through all of this.  I left a message for his wife.  I have talked to her on many occasions.  On Friday, we thought that it would be best to try to see how things went over the weekend with him getting feeds.  He is on quite a bit of tube feeds.  Yet, he is on 2 pressors now.  Cultures of seeing have been negative.  I just really think that he is "shutting down."  And I told his son that this happens with our patients who have been through quite a bit.  He still has the cancer.  I just do not expect that we will ever treat the cancer because he is just not strong enough.  Again, the prealbumin of less than 5 I think really tells how weak he is and that his body just is not going to tolerate any further insult.  I know that he is sedated right now.  I know some wants him to wake up.  I am not sure if this really is going to happen.  His labs shows a sodium of 147.  BUN is 88 creatinine 1.62.  Calcium 6.5 with an albumin of 1.7.  His white cell count is 14.7.  Hemoglobin 9.2.  Platelet count 314,000.  I know that everybody is working hard.  I just do not think that he is going to survive this insult now.  Again his blood pressure is awful low and the pressors will continue to divert blood from other vital organs to maintain his blood pressure.  Again I know this has been incredibly hard on the family.  I know that they have done all that they can do for him.  They have been incredibly  supportive and have been so motivated to allow him to have some chronic quality of life.  I know this is not going to be an easy day for anybody.  I know that tough decisions will have to be made.  I again appreciate the incredible care and compassion of all the staff up in the neuro ICU.  Lattie Haw, MD  2 Timothy 4:6-8

## 2021-10-19 NOTE — Death Summary Note (Signed)
DEATH SUMMARY   Patient Details  Name: Bill Garza MRN: [id number] DOB: 01-18-1961  Admission/Discharge Information   Admit Date:  2021/09/19  Date of Death: Date of Death: 09-29-21  Time of Death: Time of Death: 05-30-13  Length of Stay: 2022-05-31  Referring Physician: Aleen Campi, NP   Reason(s) for Hospitalization  discectomy  Diagnoses  Preliminary cause of death:  Healthcare associated pneumonia Secondary Diagnoses (including complications and co-morbidities):  Principal Problem:   Lumbar stenosis Active Problems:   Non-small cell lung cancer metastatic to brain [facility])   Malignant neoplasm metastatic to brain ([place])   HTN (hypertension)   HLD (hyperlipidemia)   DM2 (diabetes mellitus, type 2) (HCC)   HNP (herniated nucleus pulposus), lumbar   Acute respiratory failure ([place])   Malignant neoplasm metastatic to liver ([place])   Protein-calorie malnutrition, severe   Septic shock ([place]) Failure to thrive in an adult DNR Comfort measures Acute respiratory failure with hypoxemia  Brief Hospital Course (including significant findings, care, treatment, and services provided and events leading to death)  61 y/o M who was admitted on 09-20-2022 for planned discectomy.     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 He is followed by [name] for Oncology.  Currently it appears that he is on Zometa, next dose planned 11/07/2021.  At his last office visit his ECOG score was estimated to be 2.  He has had recent admission in May for intractable pain to [facility] where he required nerve block.  In addition he had an admission June 7 for pain at [facility].  He has limited PO intake with weight loss and has been on marinol for appetite stimulant.    Patient was admitted on 2022-09-20 in the setting of cancer related chronic back pain for planned discectomy of L3-4, 4-5.  Imaging on admission revealed new spinal lesions.  Postoperative course  complicated by difficult to control pain.  Per wife, the patient developed increasing shortness of breath on 7/4, moaning and increased pain.  Shortness of breath increased on a.m. of 7/5 as well as altered mental status.  Rapid response team called for evaluation.  Chest x-ray ordered which revealed mild bronchitic changes in the right lower lobe.  No acute infiltrate.  BMP showed anion gap metabolic acidosis with notable drop in CO2 from previous day labs.  PCCM consulted for evaluation.   Significant Hospital Events: Including procedures, antibiotic start and stop dates in addition to other pertinent events   2022-09-20 Admit for planned discectomy 7/5 Tx to ICU for resp distress, intubated 7/8 Developed progressive tachycardia. Hypotension, and fever overnight resulting in worsening shock state.  Diagnosed with healthcare associated pneumonia which was the cause of his respiratory failure. 7/9 remains minimally responsive on ventilator in the setting of Dilaudid drip.  Remains critically ill in the setting of metastatic lung cancer 09/30/22 Pt. With increasing pressor requirements and multi-organ failure, family at bedside, pt's trajectory worsening, GOC discussion now DNR   Attending of record was Dr. Christella Noa, PCCM consulted for medical management.    After multiple goals of care conversations with the patient's family, neurosurgery, oncology and critical care they elected to withdraw care.  He died with family at bedside.      Pertinent Labs and Studies  Significant Diagnostic Studies DG Abd Portable 1V  Result Date: 09-29-21 CLINICAL DATA:  Nausea and vomiting, lung cancer EXAM: PORTABLE ABDOMEN - 1 VIEW COMPARISON:  09/22/2021  abdominal radiograph FINDINGS: Enteric tube terminates in the distal body of the stomach. Multiple moderately dilated small bowel loops throughout the abdomen measuring up to 5.5 cm diameter in the right abdomen, new since 09/22/2021 radiographs. No evidence of  pneumatosis or pneumoperitoneum. IMPRESSION: 1. Enteric tube terminates in the distal body of the stomach. 2. Moderately dilated small bowel loops throughout the abdomen, new since 09/22/2021 radiographs, differential includes distal small bowel obstruction versus adynamic ileus. CT abdomen/pelvis alone IV contrast may be obtained for further evaluation as clinically warranted. Electronically Signed   By: Ilona Sorrel M.D.   On: 2021/10/27 08:19   DG CHEST PORT 1 VIEW  Result Date: 10-27-2021 CLINICAL DATA:  Nausea and vomiting, lung cancer EXAM: PORTABLE CHEST 1 VIEW COMPARISON:  Chest radiograph from one day prior. FINDINGS: Endotracheal tube tip is 3.6 cm above the carina. Enteric tube enters stomach with the tip not seen on this image. Stable cardiomediastinal silhouette with normal heart size. No pneumothorax. No pleural effusion. Suture line overlies the upper left lung. No pulmonary edema. Emphysema. Similar mild hazy bibasilar lung opacities. IMPRESSION: 1. Well-positioned support structures. 2. Similar mild hazy bibasilar lung opacities, favor atelectasis. 3. Emphysema. Electronically Signed   By: Ilona Sorrel M.D.   On: 10/27/21 08:17   DG Chest Port 1 View  Result Date: 09/26/2021 CLINICAL DATA:  Respiratory failure.  Follow-up exam. EXAM: PORTABLE CHEST 1 VIEW COMPARISON:  09/24/2021 and older exams. FINDINGS: Confluent opacity at the left lung base some left left hemidiaphragm, similar to the most recent prior exam. There are also right-sided ill-defined interstitial and hazy airspace opacities, more apparent than on the most recent prior exam. Small left effusion suspected.  No pneumothorax. Endotracheal tube tip projects 6 cm above the carina, higher than it appeared on the previous study. Enteric tube passes below the diaphragm. IMPRESSION: 1. Left lower lobe airspace opacity is similar to the most recent prior exam consistent with atelectasis or pneumonia. 2. More subtle interstitial and  hazy airspace opacities in the right lung have increased, consistent with more diffuse right lung infection or inflammation. 3. Endotracheal tube tip appears higher than on the previous exam, now 6 cm above the carina. Consider inserting an additional 3 cm. Electronically Signed   By: Lajean Manes M.D.   On: 09/26/2021 09:19   DG CHEST PORT 1 VIEW  Result Date: 09/24/2021 CLINICAL DATA:  Acute hypercapnic respiratory failure.  Fever. EXAM: PORTABLE CHEST 1 VIEW COMPARISON:  09/23/2021 FINDINGS: Endotracheal tube is in place, tip approximately 3.0 centimeters above the carina. RIGHT subclavian line tip overlies the RIGHT subclavian vein. Feeding tube tip is off the image, below the gastroesophageal junction. Heart size is normal. Patchy infiltrate is identified at the LEFT lung base, similar to prior studies. Patient is slightly rotated to the RIGHT, accentuating RIGHT paratracheal opacity, likely normal soft tissues. IMPRESSION: Persistent LEFT LOWER lobe infiltrate. Electronically Signed   By: Nolon Nations M.D.   On: 09/24/2021 10:09   CT HEAD WO CONTRAST (5MM)  Result Date: 09/23/2021 CLINICAL DATA:  Altered mental status.  Known brain metastasis EXAM: CT HEAD WITHOUT CONTRAST TECHNIQUE: Contiguous axial images were obtained from the base of the skull through the vertex without intravenous contrast. RADIATION DOSE REDUCTION: This exam was performed according to the departmental dose-optimization program which includes automated exposure control, adjustment of the mA and/or kV according to patient size and/or use of iterative reconstruction technique. COMPARISON:  Outside brain imaging from [facility]. The  most recent study is an MRI from 07/02/2021 and a CT scan from 06/25/2021 FINDINGS: Brain: Postoperative changes noted in the left frontal lobe likely from a prior biopsy. There is a small hematoma in the biopsy bed which I believe was present on the MRI 07/02/2021. This  measures 15 mm. No surrounding hemorrhage. Postoperative changes also noted in the left occipital region which were present on the prior CT scan from 06/25/2021. Underlying encephalomalacia and or tumoral edema. 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 This does not have the appearance of a biopsy site and is new. The ventricles are in the midline without mass effect or shift. The gray-white differentiation is maintained. The brainstem and cerebellum are grossly normal. Vascular: No hyperdense vessels or aneurysm. Skull: Postsurgical changes in the left frontal and left occipital regions. 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 Sinuses/Orbits: The paranasal sinuses and mastoid air cells are clear. The globes are intact. Other: None IMPRESSION: 1. Postoperative changes in the left frontal and left occipital regions as detailed above. 2. Small hematoma in the biopsy bed in the left frontal area. No surrounding hemorrhage. 3. 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 Electronically Signed   By: Marijo Sanes M.D.   On: 09/23/2021 10:57   DG CHEST PORT 1 VIEW  Result Date: 09/23/2021 CLINICAL DATA:  Acute respiratory failure EXAM: PORTABLE CHEST 1 VIEW COMPARISON:  None Available. FINDINGS: Endotracheal tube with distal tip approximately 4 cm above the carina. Enteric tube with distal tip coursing below the diaphragm and not included. Left basal lung opacity is unchanged. Lungs are otherwise clear without evidence of pleural effusion or pneumothorax. IMPRESSION: 1. Endotracheal tube and feeding tube as above. 2. No significant change in left basilar opacity. Electronically Signed   By: Keane Police D.O.   On: 09/23/2021 08:02   DG Abd Portable 1V  Result Date: 09/22/2021 CLINICAL DATA:  Encounter for  feeding tube placement. EXAM: PORTABLE ABDOMEN - 1 VIEW COMPARISON:  September 21, 2021 FINDINGS: Feeding tube is identified with distal tip probably in the distal stomach. The stomach is probably distended based on location of the distal aspect of the feeding tube being in the lower abdomen. Scoliosis of spine is noted. IMPRESSION: Feeding tube is identified with distal tip probably in the distal stomach. The stomach is probably distended based on location of the distal aspect of the feeding tube being in the lower abdomen. Electronically Signed   By: Abelardo Diesel M.D.   On: 09/22/2021 12:00   DG CHEST PORT 1 VIEW  Result Date: 09/22/2021 CLINICAL DATA:  Provided history: Intubation. EXAM: PORTABLE CHEST 1 VIEW COMPARISON:  Prior chest radiographs 09/22/2021 and earlier. FINDINGS: A portion of the left pleural costophrenic angle is excluded from the field of view. ET tube present with tip projecting 1 cm above the level of the carina. Heart size within normal limits. Aortic atherosclerosis. Opacity within the medial left lung base, progressed from the chest radiograph performed earlier today. No appreciable airspace consolidation within the right lung. No evidence of pleural effusion or pneumothorax. No acute bony abnormality identified. IMPRESSION: A portion of the left lateral costophrenic angle is excluded from the field of view. ET tube now present with tip projecting 1 cm above the level of the carina. Opacity within the medial left lung base,  increased from the prior examination. This may reflect atelectasis and/or pneumonia. Aortic Atherosclerosis (ICD10-I70.0). Electronically Signed   By: Kellie Simmering D.O.   On: 09/22/2021 10:56   DG Chest Port 1 View  Result Date: 09/22/2021 CLINICAL DATA:  [id number] acute respiratory distress. Recent back surgery. EXAM: PORTABLE CHEST 1 VIEW COMPARISON:  September 02, 2021 FINDINGS: The heart size and mediastinal contours are within normal limits. No focal consolidation, pleural  effusion or vascular congestion. Likely minor atelectasis at the left lung base. The visualized skeletal structures are unremarkable. IMPRESSION: Likely minor atelectasis at the left lung base. Electronically Signed   By: Frazier Richards M.D.   On: 09/22/2021 08:58   DG Abd 1 View  Result Date: 09/21/2021 CLINICAL DATA:  RIGHT abdominal pain. EXAM: ABDOMEN - 1 VIEW COMPARISON:  08/25/2021 CT FINDINGS: High density material overlying the RIGHT abdomen is noted. The bowel gas pattern is unremarkable. No new bony abnormalities are identified. IMPRESSION: 1. High density material overlying the RIGHT abdomen, possibly enteric contrast within bowel or external to the patient-correlate clinically. CT may be helpful for further evaluation as indicated 2. Unremarkable bowel gas pattern. Electronically Signed   By: Margarette Canada M.D.   On: 09/21/2021 11:44   DG Lumbar Spine 1 View  Result Date: 08/27/2021 CLINICAL DATA:  Portable lateral lumbar spine image for surgical localization. EXAM: LUMBAR SPINE - 1 VIEW COMPARISON:  MR lumbar spine, 08/25/2021. FINDINGS: Surgical probe has been inserted, tip projecting 2.5 cm posterior to the posterior upper endplate of L4. IMPRESSION: Surgical localization imaging as described. Electronically Signed   By: Lajean Manes M.D.   On: 09/16/2021 19:20   IR Radiologist Eval & Mgmt  Result Date: 09/13/2021 Please refer to notes tab for details about interventional procedure. (Op Note)   Microbiology No results found for this or any previous visit (from the past 240 hour(s)).  Lab Basic Metabolic Panel: No results for input(s): "NA", "K", "CL", "CO2", "GLUCOSE", "BUN", "CREATININE", "CALCIUM", "MG", "PHOS" in the last 168 hours. Liver Function Tests: No results for input(s): "AST", "ALT", "ALKPHOS", "BILITOT", "PROT", "ALBUMIN" in the last 168 hours. No results for input(s): "LIPASE", "AMYLASE" in the last 168 hours. No results for input(s): "AMMONIA" in the last 168  hours. CBC: No results for input(s): "WBC", "NEUTROABS", "HGB", "HCT", "MCV", "PLT" in the last 168 hours. Cardiac Enzymes: No results for input(s): "CKTOTAL", "CKMB", "CKMBINDEX", "TROPONINI" in the last 168 hours. Sepsis Labs: No results for input(s): "PROCALCITON", "WBC", "LATICACIDVEN" in the last 168 hours.  Procedures/Operations  As above   [employer] 10/11/2021, 5:27 PM

## 2021-10-19 NOTE — Progress Notes (Signed)
NAME:  Bill Garza, MRN:  [id number], DOB:  [birth year], LOS: 22 ADMISSION DATE:  08/30/2021, CONSULTATION DATE:  7/5 REFERRING MD:  Dr. Christella Noa, CHIEF COMPLAINT:  SOB, AMS    History of Present Illness:  61 y/o M who was admitted on 6/30 for planned discectomy.    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 He is followed by [name] for Oncology.  Currently it appears that he is on Zometa, next dose planned 11/07/2021.  At his last office visit his ECOG score was estimated to be 2.  He has had recent admission in May for intractable pain to [facility] where he required nerve block.  In addition he had an admission June 7 for pain at [facility].  He has limited PO intake with weight loss and has been on marinol for appetite stimulant.   Patient was admitted on 6/30 in the setting of cancer related chronic back pain for planned discectomy of L3-4, 4-5.  Imaging on admission revealed new spinal lesions.  Postoperative course complicated by difficult to control pain.  Per wife, the patient developed increasing shortness of breath on 7/4, moaning and increased pain.  Shortness of breath increased on a.m. of 7/5 as well as altered mental status.  Rapid response team called for evaluation.  Chest x-ray ordered which revealed mild bronchitic changes in the right lower lobe.  No acute infiltrate.  BMP showed anion gap metabolic acidosis with notable drop in CO2 from previous day labs.  PCCM consulted for evaluation.   Pertinent  Medical History  NSCLC with metastatic disease to brain, liver, spine.  S/p SRS, 1 cycle of adjuvant chemotherapy with cisplatin, Keytruda, Alimta.  Additional left occipital brain lesion resection with metastatic poorly differentiated carcinoma. VATS resection 08/2020 Portal Vein Thrombosis  COPD  Tobacco Abuse-quit smoking approximately 13 years ago, 40-pack-year history CAD s/p MI  HLD HLD Depression / Anxiety   Chronic Pain   Significant Hospital Events: Including procedures, antibiotic start and stop dates in addition to other pertinent events   6/30 Admit for planned discectomy 7/5 Tx to ICU for resp distress, intubated 7/8 Developed progressive tachycardia. Hypotension, and fever overnight resulting in worsening shock state  7/9 remains minimally responsive on ventilator in the setting of Dilaudid drip.  Remains critically ill in the setting of metastatic lung cancer 7/10 Pt. With increasing pressor requirements, family at bedside, pt's trajectory worsening, [place] discussion now DNR  Interim History / Subjective:  Worsening shock and organ failure, on two pressors, distended abdomen and vomiting, DNR but continue current level of care after discussion with family   Objective   Blood pressure (!) 71/47, pulse (!) 102, temperature 98.7 F (37.1 C), temperature source Axillary, resp. rate 19, height 6' (1.829 m), weight 75.8 kg, SpO2 96 %.    Vent Mode: PRVC FiO2 (%):  [40 %-50 %] 40 % Set Rate:  [22 bmp] 22 bmp Vt Set:  [660 mL] 660 mL PEEP:  [5 cmH20] 5 cmH20 Plateau Pressure:  [12 cmH20-26 cmH20] 12 cmH20   Intake/Output Summary (Last 24 hours) at 10/06/2021 0743 Last data filed at 2021/10/06 0600 Gross per 24 hour  Intake 3275.01 ml  Output 430 ml  Net 2845.01 ml    Filed Weights   09/16/21 1403 09/12/2021 1351 09/23/21 0341  Weight: 72.1 kg 77.1 kg 75.8 kg     General:  critically ill appearing M, intubated, fixed stare HEENT: MM  pink/moist, brown emesis, fixed stare, pupils equal Neuro: not on sedation, unresponsive, not triggering vent, eyes open and does not blink  CV: s1s2 tachycardic, no m/r/g PULM:  decreased breath sounds L lung, synchronous with vent GI: soft, distended hard, no BS Extremities: warm/dry, cold, 3+ edema, anasarcic Skin: no rashes or lesions  Labs: WBC 14 Creatinine 1.6 AST 245 ALT 84 Bili 1.9 Anion Gap 18, bicarb 11   Resolved Hospital Problem  list     Assessment & Plan:  Sepsis due to aspiration pneumonia with acute hypoxic respiratory failure, not present on admission Abdominal Distension Worsening shock -now on two pressors with continued hypotension -Overall clinical status and prognosis worsening, after discussion with family pt's code status changed to DNR -continue current level of care, obtain chest and abdominal xrays -check lactic acid and ABG -C. Diff recently negative       Severe anion gap metabolic acidosis with respiratory compensation related to starvation ketosis  Worsening today -Remains critically ill in the ICU -Continue to follow cultures, no growth to date -Continue ventilator support with lung protective strategies  -Wean PEEP and FiO2 for sats greater than 90%. -Head of bed elevated 30 degrees. -Plateau pressures less than 30 cm H20.  -Follow intermittent chest x-ray and ABG.   -SAT/SBT as tolerated, mentation preclude extubation  -Ensure adequate pulmonary hygiene  -VAP bundle in place  -PAD protocol -Continue empiric IV Unasyn and vancomycin -Pressors for map goal greater than 65  Acute Metabolic Encephalopathy in the setting of severe acidosis CT head showed possible new lytic lesion on the skull -worsening neuro status off sedation this morning -Maintain neuroprotective measures -Nutrition and bowel regiment -Seizure precautions  -Aspiration precautions -Minimize sedation  Significant liquid stool output  C. difficile negative 7/8 -severe abdominal distension, check KUB and lactic acid -Patient produced 2 L of liquid stool in the last 24 hours -Frequent PeriCare -holding TF   NSCLC with Metastatic Disease to Liver, Spine & Brain  Chronic Cancer Related Pain  Progression of disease on most recent imaging as of 08/25/21 Currently not a candidate for any therapy given clinical status  -Primary management, per oncology -off pain medication this morning secondary to worsening  shock -Continue attempts to adequately control pain -Monitor for constipation currently producing high-volume liquid stool -Very poor prognosis for meaningful outcome given multisystem organ dysfunction, family planning to discuss with oncology again   Portal Vein Thrombosis  -Continue full dose Lovenox  Severe Protein Calorie Malnutrition  Suspected Starvation Ketosis  -Pt vomiting with abdominal distension and shock -holding TF   CAD, HTN, HLD  -Continue aspirin and full dose Plavix  DM II  -Continue SSI -CBG every 4hrs -CBG goal 140-180 -Hold home metformin  GERD Gastroparesis   -PPI  Hypophosphatemia likely due to refeeding syndrome -Supplement as needed -Trend bmet  Best Practice (right click and "Reselect all SmartList Selections" daily)  Diet/type: NPO DVT prophylaxis: systemic dose LMWH GI prophylaxis: PPI Lines: N/A Foley:  N/A Code Status:  DNR Last date of multidisciplinary goals of care discussion: 7/10 see IPAL note   CRITICAL CARE Performed by: Otilio Carpen Navia Lindahl   Total critical care time: 35 minutes  Critical care time was exclusive of separately billable procedures and treating other patients.  Critical care was necessary to treat or prevent imminent or life-threatening deterioration.  Critical care was time spent personally by me on the following activities: development of treatment plan with patient and/or surrogate as well as nursing, discussions with consultants, evaluation  of patient's response to treatment, examination of patient, obtaining history from patient or surrogate, ordering and performing treatments and interventions, ordering and review of laboratory studies, ordering and review of radiographic studies, pulse oximetry and re-evaluation of patient's condition.   Otilio Carpen Deitrich Steve, PA-C [facility] See Amion for pager If no response to pager , please call [phone] until 7pm After 7:00 pm call Elink   [phone]?[place]

## 2021-10-19 NOTE — Progress Notes (Signed)
Interim progress note:  Pt remains hypotensive on three pressors, unresponsive off sedation, revisited status and GOC with pt's wife and family at the bedside and offered the option to transition to full comfort care.  Family now feels ready for this option and will place order once all family is at the bedside.  Otilio Carpen Brentlee Sciara, PA-C

## 2021-10-19 NOTE — Progress Notes (Signed)
   20-Oct-2021 1215  Attending [place]  Attending Physician Notified Y  Attending Physician (First and Last Name) Roselie Awkward, MD  Post Mortem Checklist  Date of Death 10-20-21  Time of Death 06-19-1213  Pronounced By Budd Palmer, RN & Marda Stalker, RN  Next of kin notified Yes  Name of next of kin notified of death [place] Person's Relationship to Patient Spouse  Contact Person's Phone Number [phone]  Contact Person's address [phone] [place] [address]  Family Communication Notes family at bedside at TOD  Was the patient a No Code Blue or a Limited Code Blue? Yes  Did the patient die unattended? No  Patient restrained? Not applicable  Height 6' (0.230 m)  Weight 72.1 kg  Body preparation complete Y  [employer] (previously known as Brewing technologist)  Notification Date 2021-10-20  Notification Time [place]  [employer] Number [phone]  Is patient a potential donor? N  Autopsy  Autopsy requested by N/A  Patient and [place] Returned  Patient is satisfied that all belongings have been returned? Not applicable  Dermatherapy linen/gowns NOT sent with patient or transporter Disposable Patient Transfer/ Apparel Kit used  Dead on Arrival (Emergency Department)  Patient dead on arrival? No  Notifications  Patient Placement notified that Post Mortem checklist is complete Yes  Patient Placement notified body transferred Transported to [place]  Is this a medical examiner's case? [place] home name/address/phone # [facility] unknown/ undecided  Planned location of pickup [place]

## 2021-10-19 DEATH — deceased

## 2022-12-09 NOTE — Radiation Completion Notes (Signed)
Patient Name: Bill Garza, Bill Garza MRN: [id number] Date of Birth: November 04, 1960 Referring Physician: Arlan Organ, M.D. Date of Service: 2022-12-09 Radiation Oncologist: Lonie Peak, M.D. 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
